# Patient Record
Sex: Male | Born: 1950
Health system: Southern US, Community
[De-identification: ages and names within clinical notes are randomized; demographics above are authoritative.]

## PROBLEM LIST (undated history)

## (undated) DIAGNOSIS — J189 Pneumonia, unspecified organism: Secondary | ICD-10-CM

## (undated) DIAGNOSIS — I447 Left bundle-branch block, unspecified: Secondary | ICD-10-CM

## (undated) DIAGNOSIS — Z95810 Presence of automatic (implantable) cardiac defibrillator: Secondary | ICD-10-CM

## (undated) DIAGNOSIS — F419 Anxiety disorder, unspecified: Secondary | ICD-10-CM

## (undated) DIAGNOSIS — Z8489 Family history of other specified conditions: Secondary | ICD-10-CM

## (undated) DIAGNOSIS — I10 Essential (primary) hypertension: Secondary | ICD-10-CM

## (undated) DIAGNOSIS — M1990 Unspecified osteoarthritis, unspecified site: Secondary | ICD-10-CM

## (undated) DIAGNOSIS — IMO0002 Reserved for concepts with insufficient information to code with codable children: Secondary | ICD-10-CM

## (undated) DIAGNOSIS — I350 Nonrheumatic aortic (valve) stenosis: Secondary | ICD-10-CM

## (undated) DIAGNOSIS — K529 Noninfective gastroenteritis and colitis, unspecified: Secondary | ICD-10-CM

## (undated) DIAGNOSIS — R011 Cardiac murmur, unspecified: Secondary | ICD-10-CM

## (undated) DIAGNOSIS — Z953 Presence of xenogenic heart valve: Secondary | ICD-10-CM

## (undated) DIAGNOSIS — Z720 Tobacco use: Secondary | ICD-10-CM

## (undated) DIAGNOSIS — I2510 Atherosclerotic heart disease of native coronary artery without angina pectoris: Secondary | ICD-10-CM

## (undated) DIAGNOSIS — I428 Other cardiomyopathies: Secondary | ICD-10-CM

## (undated) DIAGNOSIS — R911 Solitary pulmonary nodule: Secondary | ICD-10-CM

## (undated) DIAGNOSIS — N189 Chronic kidney disease, unspecified: Secondary | ICD-10-CM

## (undated) DIAGNOSIS — J449 Chronic obstructive pulmonary disease, unspecified: Secondary | ICD-10-CM

## (undated) DIAGNOSIS — R350 Frequency of micturition: Secondary | ICD-10-CM

## (undated) DIAGNOSIS — E7800 Pure hypercholesterolemia, unspecified: Secondary | ICD-10-CM

## (undated) HISTORY — PX: CARDIAC VALVE REPLACEMENT: SHX585

## (undated) HISTORY — DX: Solitary pulmonary nodule: R91.1

## (undated) HISTORY — PX: SQUAMOUS CELL CARCINOMA EXCISION: SHX2433

---
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
## 2003-07-07 ENCOUNTER — Ambulatory Visit (HOSPITAL_COMMUNITY): Admission: RE | Admit: 2003-07-07 | Discharge: 2003-07-07 | Payer: Self-pay | Admitting: *Deleted

## 2003-07-07 ENCOUNTER — Encounter (INDEPENDENT_AMBULATORY_CARE_PROVIDER_SITE_OTHER): Payer: Self-pay

## 2007-11-08 HISTORY — PX: COLECTOMY: SHX59

## 2008-02-20 ENCOUNTER — Encounter: Admission: RE | Admit: 2008-02-20 | Discharge: 2008-02-20 | Payer: Self-pay | Admitting: Gastroenterology

## 2010-11-28 ENCOUNTER — Encounter: Payer: Self-pay | Admitting: Gastroenterology

## 2015-12-23 DIAGNOSIS — L570 Actinic keratosis: Secondary | ICD-10-CM | POA: Diagnosis not present

## 2015-12-23 DIAGNOSIS — Z85828 Personal history of other malignant neoplasm of skin: Secondary | ICD-10-CM | POA: Diagnosis not present

## 2015-12-23 DIAGNOSIS — Z08 Encounter for follow-up examination after completed treatment for malignant neoplasm: Secondary | ICD-10-CM | POA: Diagnosis not present

## 2016-08-04 DIAGNOSIS — Z23 Encounter for immunization: Secondary | ICD-10-CM | POA: Diagnosis not present

## 2016-08-04 DIAGNOSIS — D492 Neoplasm of unspecified behavior of bone, soft tissue, and skin: Secondary | ICD-10-CM | POA: Diagnosis not present

## 2016-08-04 DIAGNOSIS — F419 Anxiety disorder, unspecified: Secondary | ICD-10-CM | POA: Diagnosis not present

## 2016-09-05 DIAGNOSIS — D2311 Other benign neoplasm of skin of right eyelid, including canthus: Secondary | ICD-10-CM | POA: Diagnosis not present

## 2016-09-05 DIAGNOSIS — H2513 Age-related nuclear cataract, bilateral: Secondary | ICD-10-CM | POA: Diagnosis not present

## 2016-09-05 DIAGNOSIS — D2312 Other benign neoplasm of skin of left eyelid, including canthus: Secondary | ICD-10-CM | POA: Diagnosis not present

## 2016-09-14 DIAGNOSIS — Z85828 Personal history of other malignant neoplasm of skin: Secondary | ICD-10-CM | POA: Diagnosis not present

## 2016-09-14 DIAGNOSIS — Z08 Encounter for follow-up examination after completed treatment for malignant neoplasm: Secondary | ICD-10-CM | POA: Diagnosis not present

## 2016-09-14 DIAGNOSIS — L570 Actinic keratosis: Secondary | ICD-10-CM | POA: Diagnosis not present

## 2016-10-19 DIAGNOSIS — H01004 Unspecified blepharitis left upper eyelid: Secondary | ICD-10-CM | POA: Diagnosis not present

## 2016-10-19 DIAGNOSIS — H01002 Unspecified blepharitis right lower eyelid: Secondary | ICD-10-CM | POA: Diagnosis not present

## 2016-10-19 DIAGNOSIS — H01001 Unspecified blepharitis right upper eyelid: Secondary | ICD-10-CM | POA: Diagnosis not present

## 2016-10-19 DIAGNOSIS — H01005 Unspecified blepharitis left lower eyelid: Secondary | ICD-10-CM | POA: Diagnosis not present

## 2016-11-07 DIAGNOSIS — J189 Pneumonia, unspecified organism: Secondary | ICD-10-CM

## 2016-11-07 HISTORY — DX: Pneumonia, unspecified organism: J18.9

## 2017-06-01 DIAGNOSIS — F419 Anxiety disorder, unspecified: Secondary | ICD-10-CM | POA: Diagnosis not present

## 2017-06-01 DIAGNOSIS — Z125 Encounter for screening for malignant neoplasm of prostate: Secondary | ICD-10-CM | POA: Diagnosis not present

## 2017-06-01 DIAGNOSIS — Z0000 Encounter for general adult medical examination without abnormal findings: Secondary | ICD-10-CM | POA: Diagnosis not present

## 2017-06-01 DIAGNOSIS — Z131 Encounter for screening for diabetes mellitus: Secondary | ICD-10-CM | POA: Diagnosis not present

## 2017-06-01 DIAGNOSIS — Z23 Encounter for immunization: Secondary | ICD-10-CM | POA: Diagnosis not present

## 2017-06-01 DIAGNOSIS — C44222 Squamous cell carcinoma of skin of right ear and external auricular canal: Secondary | ICD-10-CM | POA: Diagnosis not present

## 2017-06-01 DIAGNOSIS — Z136 Encounter for screening for cardiovascular disorders: Secondary | ICD-10-CM | POA: Diagnosis not present

## 2017-06-01 DIAGNOSIS — Z79899 Other long term (current) drug therapy: Secondary | ICD-10-CM | POA: Diagnosis not present

## 2017-07-26 DIAGNOSIS — Z08 Encounter for follow-up examination after completed treatment for malignant neoplasm: Secondary | ICD-10-CM | POA: Diagnosis not present

## 2017-07-26 DIAGNOSIS — L570 Actinic keratosis: Secondary | ICD-10-CM | POA: Diagnosis not present

## 2017-07-26 DIAGNOSIS — Z85828 Personal history of other malignant neoplasm of skin: Secondary | ICD-10-CM | POA: Diagnosis not present

## 2017-09-01 ENCOUNTER — Encounter (HOSPITAL_BASED_OUTPATIENT_CLINIC_OR_DEPARTMENT_OTHER): Payer: Self-pay

## 2017-09-01 ENCOUNTER — Emergency Department (HOSPITAL_BASED_OUTPATIENT_CLINIC_OR_DEPARTMENT_OTHER): Payer: Medicare HMO

## 2017-09-01 ENCOUNTER — Inpatient Hospital Stay (HOSPITAL_BASED_OUTPATIENT_CLINIC_OR_DEPARTMENT_OTHER)
Admission: EM | Admit: 2017-09-01 | Discharge: 2017-09-08 | DRG: 871 | Disposition: A | Discharge status: Home Health | Payer: Medicare HMO | Attending: Internal Medicine | Admitting: Internal Medicine

## 2017-09-01 DIAGNOSIS — I5021 Acute systolic (congestive) heart failure: Secondary | ICD-10-CM

## 2017-09-01 DIAGNOSIS — Y95 Nosocomial condition: Secondary | ICD-10-CM | POA: Diagnosis not present

## 2017-09-01 DIAGNOSIS — Z23 Encounter for immunization: Secondary | ICD-10-CM

## 2017-09-01 DIAGNOSIS — Y712 Prosthetic and other implants, materials and accessory cardiovascular devices associated with adverse incidents: Secondary | ICD-10-CM | POA: Diagnosis not present

## 2017-09-01 DIAGNOSIS — T82524A Displacement of infusion catheter, initial encounter: Secondary | ICD-10-CM | POA: Diagnosis present

## 2017-09-01 DIAGNOSIS — I272 Pulmonary hypertension, unspecified: Secondary | ICD-10-CM | POA: Diagnosis present

## 2017-09-01 DIAGNOSIS — G9340 Encephalopathy, unspecified: Secondary | ICD-10-CM | POA: Diagnosis present

## 2017-09-01 DIAGNOSIS — R6520 Severe sepsis without septic shock: Secondary | ICD-10-CM

## 2017-09-01 DIAGNOSIS — Z95828 Presence of other vascular implants and grafts: Secondary | ICD-10-CM

## 2017-09-01 DIAGNOSIS — N170 Acute kidney failure with tubular necrosis: Secondary | ICD-10-CM | POA: Diagnosis not present

## 2017-09-01 DIAGNOSIS — Z452 Encounter for adjustment and management of vascular access device: Secondary | ICD-10-CM | POA: Diagnosis not present

## 2017-09-01 DIAGNOSIS — I447 Left bundle-branch block, unspecified: Secondary | ICD-10-CM | POA: Diagnosis not present

## 2017-09-01 DIAGNOSIS — I428 Other cardiomyopathies: Secondary | ICD-10-CM

## 2017-09-01 DIAGNOSIS — J9602 Acute respiratory failure with hypercapnia: Secondary | ICD-10-CM | POA: Diagnosis not present

## 2017-09-01 DIAGNOSIS — T380X5A Adverse effect of glucocorticoids and synthetic analogues, initial encounter: Secondary | ICD-10-CM | POA: Diagnosis present

## 2017-09-01 DIAGNOSIS — R6521 Severe sepsis with septic shock: Secondary | ICD-10-CM | POA: Diagnosis not present

## 2017-09-01 DIAGNOSIS — J449 Chronic obstructive pulmonary disease, unspecified: Secondary | ICD-10-CM | POA: Diagnosis present

## 2017-09-01 DIAGNOSIS — E785 Hyperlipidemia, unspecified: Secondary | ICD-10-CM | POA: Diagnosis present

## 2017-09-01 DIAGNOSIS — F419 Anxiety disorder, unspecified: Secondary | ICD-10-CM | POA: Diagnosis present

## 2017-09-01 DIAGNOSIS — J9601 Acute respiratory failure with hypoxia: Secondary | ICD-10-CM | POA: Diagnosis not present

## 2017-09-01 DIAGNOSIS — Z01818 Encounter for other preprocedural examination: Secondary | ICD-10-CM

## 2017-09-01 DIAGNOSIS — R40241 Glasgow coma scale score 13-15, unspecified time: Secondary | ICD-10-CM | POA: Diagnosis present

## 2017-09-01 DIAGNOSIS — J9690 Respiratory failure, unspecified, unspecified whether with hypoxia or hypercapnia: Secondary | ICD-10-CM | POA: Diagnosis not present

## 2017-09-01 DIAGNOSIS — K760 Fatty (change of) liver, not elsewhere classified: Secondary | ICD-10-CM | POA: Diagnosis not present

## 2017-09-01 DIAGNOSIS — R0602 Shortness of breath: Secondary | ICD-10-CM | POA: Diagnosis not present

## 2017-09-01 DIAGNOSIS — Z7982 Long term (current) use of aspirin: Secondary | ICD-10-CM

## 2017-09-01 DIAGNOSIS — I429 Cardiomyopathy, unspecified: Secondary | ICD-10-CM | POA: Diagnosis present

## 2017-09-01 DIAGNOSIS — R9431 Abnormal electrocardiogram [ECG] [EKG]: Secondary | ICD-10-CM | POA: Diagnosis not present

## 2017-09-01 DIAGNOSIS — R918 Other nonspecific abnormal finding of lung field: Secondary | ICD-10-CM | POA: Diagnosis not present

## 2017-09-01 DIAGNOSIS — R7303 Prediabetes: Secondary | ICD-10-CM | POA: Diagnosis present

## 2017-09-01 DIAGNOSIS — T82898A Other specified complication of vascular prosthetic devices, implants and grafts, initial encounter: Secondary | ICD-10-CM | POA: Diagnosis not present

## 2017-09-01 DIAGNOSIS — Z720 Tobacco use: Secondary | ICD-10-CM | POA: Diagnosis present

## 2017-09-01 DIAGNOSIS — J9600 Acute respiratory failure, unspecified whether with hypoxia or hypercapnia: Secondary | ICD-10-CM | POA: Diagnosis not present

## 2017-09-01 DIAGNOSIS — K761 Chronic passive congestion of liver: Secondary | ICD-10-CM | POA: Diagnosis present

## 2017-09-01 DIAGNOSIS — F17210 Nicotine dependence, cigarettes, uncomplicated: Secondary | ICD-10-CM | POA: Diagnosis present

## 2017-09-01 DIAGNOSIS — I248 Other forms of acute ischemic heart disease: Secondary | ICD-10-CM | POA: Diagnosis present

## 2017-09-01 DIAGNOSIS — Z8249 Family history of ischemic heart disease and other diseases of the circulatory system: Secondary | ICD-10-CM

## 2017-09-01 DIAGNOSIS — K529 Noninfective gastroenteritis and colitis, unspecified: Secondary | ICD-10-CM | POA: Diagnosis present

## 2017-09-01 DIAGNOSIS — I350 Nonrheumatic aortic (valve) stenosis: Secondary | ICD-10-CM | POA: Diagnosis not present

## 2017-09-01 DIAGNOSIS — E876 Hypokalemia: Secondary | ICD-10-CM | POA: Diagnosis present

## 2017-09-01 DIAGNOSIS — Z4682 Encounter for fitting and adjustment of non-vascular catheter: Secondary | ICD-10-CM | POA: Diagnosis not present

## 2017-09-01 DIAGNOSIS — Z9049 Acquired absence of other specified parts of digestive tract: Secondary | ICD-10-CM

## 2017-09-01 DIAGNOSIS — J440 Chronic obstructive pulmonary disease with acute lower respiratory infection: Secondary | ICD-10-CM | POA: Diagnosis not present

## 2017-09-01 DIAGNOSIS — Z4659 Encounter for fitting and adjustment of other gastrointestinal appliance and device: Secondary | ICD-10-CM

## 2017-09-01 DIAGNOSIS — Z79899 Other long term (current) drug therapy: Secondary | ICD-10-CM

## 2017-09-01 DIAGNOSIS — D696 Thrombocytopenia, unspecified: Secondary | ICD-10-CM | POA: Diagnosis present

## 2017-09-01 DIAGNOSIS — A419 Sepsis, unspecified organism: Principal | ICD-10-CM | POA: Diagnosis present

## 2017-09-01 DIAGNOSIS — I2510 Atherosclerotic heart disease of native coronary artery without angina pectoris: Secondary | ICD-10-CM | POA: Diagnosis present

## 2017-09-01 DIAGNOSIS — J122 Parainfluenza virus pneumonia: Secondary | ICD-10-CM | POA: Diagnosis not present

## 2017-09-01 HISTORY — DX: Anxiety disorder, unspecified: F41.9

## 2017-09-01 HISTORY — DX: Left bundle-branch block, unspecified: I44.7

## 2017-09-01 HISTORY — DX: Noninfective gastroenteritis and colitis, unspecified: K52.9

## 2017-09-01 HISTORY — DX: Nonrheumatic aortic (valve) stenosis: I35.0

## 2017-09-01 HISTORY — DX: Chronic obstructive pulmonary disease, unspecified: J44.9

## 2017-09-01 HISTORY — DX: Tobacco use: Z72.0

## 2017-09-01 HISTORY — DX: Other cardiomyopathies: I42.8

## 2017-09-01 LAB — I-STAT ARTERIAL BLOOD GAS, ED
Acid-base deficit: 10 mmol/L — ABNORMAL HIGH (ref 0.0–2.0)
BICARBONATE: 18.1 mmol/L — AB (ref 20.0–28.0)
O2 Saturation: 94 %
PO2 ART: 100 mmHg (ref 83.0–108.0)
TCO2: 19 mmol/L — ABNORMAL LOW (ref 22–32)
pCO2 arterial: 50.2 mmHg — ABNORMAL HIGH (ref 32.0–48.0)
pH, Arterial: 7.176 — CL (ref 7.350–7.450)

## 2017-09-01 LAB — CBC WITH DIFFERENTIAL/PLATELET
BASOS PCT: 0 %
Basophils Absolute: 0 10*3/uL (ref 0.0–0.1)
EOS ABS: 0 10*3/uL (ref 0.0–0.7)
Eosinophils Relative: 0 %
HCT: 53.5 % — ABNORMAL HIGH (ref 39.0–52.0)
HEMOGLOBIN: 17.3 g/dL — AB (ref 13.0–17.0)
LYMPHS ABS: 4 10*3/uL (ref 0.7–4.0)
Lymphocytes Relative: 40 %
MCH: 31.4 pg (ref 26.0–34.0)
MCHC: 32.3 g/dL (ref 30.0–36.0)
MCV: 97.1 fL (ref 78.0–100.0)
Monocytes Absolute: 1 10*3/uL (ref 0.1–1.0)
Monocytes Relative: 11 %
NEUTROS PCT: 49 %
Neutro Abs: 4.8 10*3/uL (ref 1.7–7.7)
Platelets: 129 10*3/uL — ABNORMAL LOW (ref 150–400)
RBC: 5.51 MIL/uL (ref 4.22–5.81)
RDW: 14.7 % (ref 11.5–15.5)
WBC: 9.9 10*3/uL (ref 4.0–10.5)

## 2017-09-01 LAB — COMPREHENSIVE METABOLIC PANEL
ALBUMIN: 4.1 g/dL (ref 3.5–5.0)
ALK PHOS: 132 U/L — AB (ref 38–126)
ALT: 68 U/L — AB (ref 17–63)
AST: 128 U/L — AB (ref 15–41)
Anion gap: 13 (ref 5–15)
BUN: 15 mg/dL (ref 6–20)
CALCIUM: 8.5 mg/dL — AB (ref 8.9–10.3)
CO2: 21 mmol/L — AB (ref 22–32)
CREATININE: 1.58 mg/dL — AB (ref 0.61–1.24)
Chloride: 105 mmol/L (ref 101–111)
GFR calc Af Amer: 51 mL/min — ABNORMAL LOW (ref >60)
GFR calc non Af Amer: 44 mL/min — ABNORMAL LOW (ref >60)
GLUCOSE: 257 mg/dL — AB (ref 65–99)
Potassium: 3.4 mmol/L — ABNORMAL LOW (ref 3.5–5.1)
SODIUM: 139 mmol/L (ref 135–145)
Total Bilirubin: 0.9 mg/dL (ref 0.3–1.2)
Total Protein: 7.3 g/dL (ref 6.5–8.1)

## 2017-09-01 LAB — BRAIN NATRIURETIC PEPTIDE: B Natriuretic Peptide: 885.5 pg/mL — ABNORMAL HIGH (ref 0.0–100.0)

## 2017-09-01 LAB — I-STAT CG4 LACTIC ACID, ED: Lactic Acid, Venous: 3.17 mmol/L (ref 0.5–1.9)

## 2017-09-01 LAB — TROPONIN I: Troponin I: 0.44 ng/mL (ref <0.03)

## 2017-09-01 MED ORDER — SODIUM CHLORIDE 0.9 % IV BOLUS (SEPSIS)
1000.0000 mL | Freq: Once | INTRAVENOUS | Status: AC
  Administered 2017-09-01 (×2): 1000 mL via INTRAVENOUS

## 2017-09-01 MED ORDER — SODIUM CHLORIDE 0.9 % IV BOLUS (SEPSIS)
1000.0000 mL | Freq: Once | INTRAVENOUS | Status: AC
  Administered 2017-09-01: 1000 mL via INTRAVENOUS

## 2017-09-01 MED ORDER — FENTANYL CITRATE (PF) 100 MCG/2ML IJ SOLN
50.0000 ug | Freq: Once | INTRAMUSCULAR | Status: AC
  Administered 2017-09-01: 50 ug via INTRAVENOUS
  Filled 2017-09-01: qty 2

## 2017-09-01 MED ORDER — LEVALBUTEROL HCL 0.63 MG/3ML IN NEBU
0.6300 mg | INHALATION_SOLUTION | Freq: Once | RESPIRATORY_TRACT | Status: AC
  Administered 2017-09-01: 0.63 mg via RESPIRATORY_TRACT
  Filled 2017-09-01: qty 3

## 2017-09-01 MED ORDER — METHYLPREDNISOLONE SODIUM SUCC 125 MG IJ SOLR
125.0000 mg | Freq: Once | INTRAMUSCULAR | Status: AC
  Administered 2017-09-01: 125 mg via INTRAVENOUS
  Filled 2017-09-01: qty 2

## 2017-09-01 MED ORDER — LORAZEPAM 2 MG/ML IJ SOLN
0.5000 mg | Freq: Once | INTRAMUSCULAR | Status: AC
  Administered 2017-09-01: 0.5 mg via INTRAVENOUS

## 2017-09-01 MED ORDER — ETOMIDATE 2 MG/ML IV SOLN
INTRAVENOUS | Status: AC
  Administered 2017-09-01: 20 mg
  Filled 2017-09-01: qty 10

## 2017-09-01 MED ORDER — LORAZEPAM 2 MG/ML IJ SOLN
INTRAMUSCULAR | Status: AC
  Filled 2017-09-01: qty 1

## 2017-09-01 MED ORDER — MIDAZOLAM HCL 10 MG/2ML IJ SOLN
INTRAMUSCULAR | Status: AC
  Filled 2017-09-01: qty 10

## 2017-09-01 MED ORDER — SODIUM CHLORIDE 0.9 % IV SOLN
0.5000 mg/h | INTRAVENOUS | Status: DC
  Filled 2017-09-01: qty 10

## 2017-09-01 MED ORDER — ACETAMINOPHEN 650 MG RE SUPP
RECTAL | Status: AC
  Filled 2017-09-01: qty 1

## 2017-09-01 MED ORDER — ACETAMINOPHEN 650 MG RE SUPP
650.0000 mg | Freq: Once | RECTAL | Status: AC
  Administered 2017-09-01: 650 mg via RECTAL

## 2017-09-01 MED ORDER — PROPOFOL 1000 MG/100ML IV EMUL
INTRAVENOUS | Status: AC
  Administered 2017-09-01: 23:00:00 via INTRAVENOUS
  Filled 2017-09-01: qty 100

## 2017-09-01 MED ORDER — ACETAMINOPHEN 500 MG PO TABS: 1000.0000 mg | ORAL_TABLET | Freq: Once | ORAL | Status: DC

## 2017-09-01 MED ORDER — PIPERACILLIN-TAZOBACTAM 3.375 G IVPB 30 MIN
3.3750 g | Freq: Once | INTRAVENOUS | Status: AC
  Administered 2017-09-01: 3.375 g via INTRAVENOUS
  Filled 2017-09-01 (×2): qty 50

## 2017-09-01 MED ORDER — NOREPINEPHRINE BITARTRATE 1 MG/ML IV SOLN
0.0000 ug/min | Freq: Once | INTRAVENOUS | Status: AC
  Administered 2017-09-01: 18.8 ug/min via INTRAVENOUS
  Filled 2017-09-01: qty 4

## 2017-09-01 MED ORDER — VANCOMYCIN HCL IN DEXTROSE 1-5 GM/200ML-% IV SOLN
1000.0000 mg | Freq: Once | INTRAVENOUS | Status: AC
  Administered 2017-09-01: 1000 mg via INTRAVENOUS
  Filled 2017-09-01: qty 200

## 2017-09-01 MED ORDER — SUCCINYLCHOLINE CHLORIDE 20 MG/ML IJ SOLN
INTRAMUSCULAR | Status: AC
  Administered 2017-09-01: 100 mg
  Filled 2017-09-01: qty 1

## 2017-09-01 NOTE — ED Triage Notes (Signed)
Pt c/o SOB x 1.5 hrs-states he did have prod cough this week-pt labored breathing-brought back to tx room via w/c

## 2017-09-01 NOTE — ED Notes (Signed)
Critical troponin, 0.44, [place] informed

## 2017-09-01 NOTE — ED Notes (Signed)
Contacted [employer] account planner) - Critical Care - Code Sepsis.

## 2017-09-01 NOTE — ED Provider Notes (Signed)
[facility] EMERGENCY DEPARTMENT Provider Note   CSN: [id number] Arrival date & time: 09/01/17  2150     History   Chief Complaint Chief Complaint  Patient presents with  . Shortness of Breath    HPI Kenneth Mills is a 66 y.o. male.  HPI Presents with 3 days of increasing shortness of breath.  Associated with subjective fever and chills.  He has had a productive cough.  Denies having any chest pain.  No new lower extremity swelling or pain. Past Medical History:  Diagnosis Date  . COPD (chronic obstructive pulmonary disease) [facility])     Patient Active Problem List   Diagnosis Date Noted  . Respiratory failure ([place]) 09/01/2017    Past Surgical History:  Procedure Laterality Date  . COLON SURGERY    . HERNIA REPAIR         Home Medications    Prior to Admission medications   Medication Sig Start Date End Date Taking? Authorizing Provider  ClonazePAM (KLONOPIN PO) Take by mouth.   Yes [provider]    Family History No family history on file.  Social History Social History  Substance Use Topics  . Smoking status: Current Every Day Smoker    Types: Cigarettes  . Smokeless tobacco: Never Used  . Alcohol use Yes     Allergies   Patient has no known allergies.   Review of Systems Review of Systems  Constitutional: Positive for chills, fatigue and fever.  Respiratory: Positive for cough and shortness of breath.   Cardiovascular: Negative for chest pain and leg swelling.  Gastrointestinal: Negative for diarrhea and vomiting.  Genitourinary: Negative for dysuria and flank pain.  Musculoskeletal: Negative for back pain, neck pain and neck stiffness.  Skin: Negative for rash and wound.  Neurological: Positive for weakness. Negative for dizziness, numbness and headaches.  All other systems reviewed and are negative.    Physical Exam Updated Vital Signs BP (!) 130/92   Pulse (!) 144   Temp (!) 102.8 F (39.3 C) (Rectal)    Resp 14   SpO2 (!) 80%   Physical Exam  Constitutional: He is oriented to person, place, and time. He appears well-developed and well-nourished. He appears distressed.  HENT:  Head: Normocephalic and atraumatic.  Mouth/Throat: Oropharynx is clear and moist. No oropharyngeal exudate.  Eyes: Pupils are equal, round, and reactive to light. EOM are normal.  Neck: Normal range of motion. Neck supple. No JVD present.  Cardiovascular: Regular rhythm.   Tachycardia  Pulmonary/Chest:  Increased respiratory effort.  Using abdominal accessory muscles.  Prolonged expiratory phase and end expiratory wheezes especially in the left lung fields.  Rhonchi in left lung base.  Abdominal: Soft. Bowel sounds are normal. There is no tenderness. There is no rebound and no guarding.  Musculoskeletal: Normal range of motion. He exhibits no edema or tenderness.  No lower extremity swelling or asymmetry.  Distal pulses intact.  Lymphadenopathy:    He has no cervical adenopathy.  Neurological: He is alert and oriented to person, place, and time.  Skin: Skin is warm and dry. No rash noted. No erythema.  Mottled and cyanotic appearing  Nursing note and vitals reviewed.    ED Treatments / Results  Labs (all labs ordered are listed, but only abnormal results are displayed) Labs Reviewed  BRAIN NATRIURETIC PEPTIDE - Abnormal; Notable for the following:       Result Value   B Natriuretic Peptide 885.5 (*)    All other components  within normal limits  CBC WITH DIFFERENTIAL/PLATELET - Abnormal; Notable for the following:    Hemoglobin 17.3 (*)    HCT 53.5 (*)    Platelets 129 (*)    All other components within normal limits  COMPREHENSIVE METABOLIC PANEL - Abnormal; Notable for the following:    Potassium 3.4 (*)    CO2 21 (*)    Glucose, Bld 257 (*)    Creatinine, Ser 1.58 (*)    Calcium 8.5 (*)    AST 128 (*)    ALT 68 (*)    Alkaline Phosphatase 132 (*)    GFR calc non Af Amer 44 (*)    GFR calc Af  Amer 51 (*)    All other components within normal limits  TROPONIN I - Abnormal; Notable for the following:    Troponin I 0.44 (*)    All other components within normal limits  I-STAT CG4 LACTIC ACID, ED - Abnormal; Notable for the following:    Lactic Acid, Venous 3.17 (*)    All other components within normal limits  I-STAT ARTERIAL BLOOD GAS, ED - Abnormal; Notable for the following:    pH, Arterial 7.176 (*)    pCO2 arterial 50.2 (*)    Bicarbonate 18.1 (*)    TCO2 19 (*)    Acid-base deficit 10.0 (*)    All other components within normal limits  CULTURE, BLOOD (ROUTINE X 2)  CULTURE, BLOOD (ROUTINE X 2)  BLOOD GAS, ARTERIAL  INFLUENZA PANEL BY PCR (TYPE A & B)    EKG  EKG Interpretation  Date/Time:  Friday September 01 2017 22:59:00 EDT Ventricular Rate:  143 PR Interval:    QRS Duration: 160 QT Interval:  354 QTC Calculation: 547 R Axis:   23 Text Interpretation:  Sinus tachycardia Left bundle branch block Baseline wander in lead(s) V3 Confirmed by Julianne Rice [phone]) on 09/01/2017 11:17:39 PM       Radiology Dg Chest Port 1 View  Result Date: 09/01/2017 CLINICAL DATA:  Shortness of breath and fever EXAM: PORTABLE CHEST 1 VIEW COMPARISON:  None. FINDINGS: Cardiomegaly. Extensive diffuse interstitial and alveolar opacity right greater than left. No pleural effusion. No pneumothorax. IMPRESSION: 1. Extensive diffuse right greater than left interstitial and alveolar opacity which may reflect pulmonary edema, diffuse infection or combination of both 2. Cardiomegaly Electronically Signed   By: Donavan Foil M.D.   On: 09/01/2017 22:24   Dg Chest Port 1v Same Day  Result Date: 09/01/2017 CLINICAL DATA:  Post intubation EXAM: PORTABLE CHEST 1 VIEW COMPARISON:  09/01/2017 FINDINGS: Interval placement of endotracheal tube, the tip is about 3.4 cm superior to carina. Non inclusion of lung bases. Similar appearance of extensive interstitial and alveolar opacity.  Cardiomegaly IMPRESSION: Endotracheal tube tip about 3.4 cm superior to carina. Similar appearance of extensive interstitial and alveolar infiltrate or edema Electronically Signed   By: Donavan Foil M.D.   On: 09/01/2017 23:09    Procedures Procedure Name: Intubation Date/Time: 09/01/2017 11:12 PM Performed by: Julianne Rice Pre-anesthesia Checklist: Suction available, Patient identified and Patient being monitored Oxygen Delivery Method: Ambu bag Preoxygenation: Pre-oxygenation with 100% oxygen Induction Type: Rapid sequence Ventilation: Mask ventilation without difficulty Laryngoscope Size: Glidescope and 4 Tube size: 8.0 mm Number of attempts: 1 Placement Confirmation: ETT inserted through vocal cords under direct vision,  CO2 detector and Breath sounds checked- equal and bilateral Secured at: 25 cm Tube secured with: ETT holder Comments: Desaturation into the 70s which improved on vent.      (  including critical care time)  Medications Ordered in ED Medications  LORazepam (ATIVAN) 2 MG/ML injection (not administered)  sodium chloride 0.9 % bolus 1,000 mL (1,000 mLs Intravenous New Bag/Given 09/01/17 2310)  sodium chloride 0.9 % bolus 1,000 mL (1,000 mLs Intravenous New Bag/Given 09/01/17 2310)  fentaNYL (SUBLIMAZE) injection 50 mcg (not administered)  sodium chloride 0.9 % bolus 1,000 mL (0 mLs Intravenous Stopped 09/01/17 2300)  vancomycin (VANCOCIN) IVPB 1000 mg/200 mL premix (1,000 mg Intravenous New Bag/Given 09/01/17 2206)  piperacillin-tazobactam (ZOSYN) IVPB 3.375 g (0 g Intravenous Stopped 09/01/17 2232)  levalbuterol (XOPENEX) nebulizer solution 0.63 mg (0.63 mg Nebulization Given 09/01/17 2207)  methylPREDNISolone sodium succinate (SOLU-MEDROL) 125 mg/2 mL injection 125 mg (125 mg Intravenous Given 09/01/17 2206)  acetaminophen (TYLENOL) suppository 650 mg (650 mg Rectal Given 09/01/17 2206)  LORazepam (ATIVAN) injection 0.5 mg (0.5 mg Intravenous Given 09/01/17  2217)  etomidate (AMIDATE) 2 MG/ML injection (20 mg  Given 09/01/17 2252)  succinylcholine (ANECTINE) 20 MG/ML injection (100 mg  Given 09/01/17 2252)  propofol (DIPRIVAN) 1000 MG/100ML infusion ( Intravenous (Continuous Infusion) New Bag/Given 09/01/17 2256)   CRITICAL CARE Performed by: Lita Mains, Haruna Rohlfs Total critical care time:55 minutes Critical care time was exclusive of separately billable procedures and treating other patients. Critical care was necessary to treat or prevent imminent or life-threatening deterioration. Critical care was time spent personally by me on the following activities: development of treatment plan with patient and/or surrogate as well as nursing, discussions with consultants, evaluation of patient's response to treatment, examination of patient, obtaining history from patient or surrogate, ordering and performing treatments and interventions, ordering and review of laboratory studies, ordering and review of radiographic studies, pulse oximetry and re-evaluation of patient's condition.  Initial Impression / Assessment and Plan / ED Course  I have reviewed the triage vital signs and the nursing notes.  Pertinent labs & imaging results that were available during my care of the patient were reviewed by me and considered in my medical decision making (see chart for details).    Initial trial of BiPAP with improvement of oxygen saturations.  Patient continued to be tachycardic and tachypneic.  Rectal temperature of 102.8.  Initiated sepsis protocol with broad spectrum antibiotics and IV fluids.  Chest x-ray consistent with infectious process versus pulmonary edema.  Patient beginning to tire on BiPAP.  Decision made to intubate.  Discussed with both patient and the patient's daughter.  During the patient patient's saturations dropped into the 70s.  Improve gradually on ventilator.  Repeat chest x-ray with worsening bilateral infiltrates.  ET tube appears to be in adequate  positioning.  Discussed with Dr. Valentino Saxon.  Will accept patient in transfer to [facility] ICU.  Final Clinical Impressions(s) / ED Diagnoses   Final diagnoses:  Severe sepsis ([place])  Acute respiratory failure with hypoxia [facility])    New Prescriptions New Prescriptions   No medications on file     Julianne Rice, MD 09/01/17 2317

## 2017-09-01 NOTE — ED Notes (Signed)
Family at bedside. 

## 2017-09-02 ENCOUNTER — Inpatient Hospital Stay (HOSPITAL_COMMUNITY): Payer: Medicare HMO

## 2017-09-02 DIAGNOSIS — R9431 Abnormal electrocardiogram [ECG] [EKG]: Secondary | ICD-10-CM

## 2017-09-02 LAB — RESPIRATORY PANEL BY PCR
ADENOVIRUS-RVPPCR: NOT DETECTED
BORDETELLA PERTUSSIS-RVPCR: NOT DETECTED
CORONAVIRUS 229E-RVPPCR: NOT DETECTED
CORONAVIRUS HKU1-RVPPCR: NOT DETECTED
CORONAVIRUS NL63-RVPPCR: NOT DETECTED
CORONAVIRUS OC43-RVPPCR: NOT DETECTED
Chlamydophila pneumoniae: NOT DETECTED
Influenza A: NOT DETECTED
Influenza B: NOT DETECTED
METAPNEUMOVIRUS-RVPPCR: NOT DETECTED
Mycoplasma pneumoniae: NOT DETECTED
PARAINFLUENZA VIRUS 1-RVPPCR: NOT DETECTED
PARAINFLUENZA VIRUS 2-RVPPCR: DETECTED — AB
PARAINFLUENZA VIRUS 3-RVPPCR: NOT DETECTED
Parainfluenza Virus 4: NOT DETECTED
RHINOVIRUS / ENTEROVIRUS - RVPPCR: NOT DETECTED
Respiratory Syncytial Virus: NOT DETECTED

## 2017-09-02 LAB — ECHOCARDIOGRAM COMPLETE
AV Area mean vel: 0.62 cm2
AV VEL mean LVOT/AV: 0.18
AV area mean vel ind: 0.31 cm2/m2
AV peak Index: 0.34
AV pk vel: 328 cm/s
AV vel: 0.66
AVA: 0.66 cm2
AVAREAVTI: 0.67 cm2
AVAREAVTIIND: 0.33 cm2/m2
AVG: 25 mmHg
AVPG: 43 mmHg
Ao pk vel: 0.19 m/s
CHL CUP AV VALUE AREA INDEX: 0.33
CHL CUP DOP CALC LVOT VTI: 12.7 cm
DOP CAL AO MEAN VELOCITY: 233 cm/s
FS: 12 % — AB (ref 28–44)
Height: 71 in
IV/PV OW: 0.82
LA diam end sys: 52 mm
LA vol index: 33.3 mL/m2
LA vol: 66.6 mL
LADIAMINDEX: 2.6 cm/m2
LASIZE: 52 mm
LAVOLA4C: 54.6 mL
LDCA: 3.46 cm2
LV TDI E'LATERAL: 13.4
LV dias vol index: 117 mL/m2
LVDIAVOL: 233 mL — AB (ref 62–150)
LVELAT: 13.4 cm/s
LVOT diameter: 21 mm
LVOT peak VTI: 0.19 cm
LVOTPV: 63.1 cm/s
LVOTSV: 44 mL
MV VTI: 161 cm
PW: 13.6 mm — AB (ref 0.6–1.1)
RV LATERAL S' VELOCITY: 12 cm/s
RV TAPSE: 15.5 mm
VTI: 66.6 cm
Weight: 2818.36 oz

## 2017-09-02 LAB — COMPREHENSIVE METABOLIC PANEL
ALT: 110 U/L — AB (ref 17–63)
AST: 217 U/L — AB (ref 15–41)
Albumin: 3.2 g/dL — ABNORMAL LOW (ref 3.5–5.0)
Alkaline Phosphatase: 117 U/L (ref 38–126)
Anion gap: 11 (ref 5–15)
BUN: 12 mg/dL (ref 6–20)
CHLORIDE: 112 mmol/L — AB (ref 101–111)
CO2: 16 mmol/L — AB (ref 22–32)
CREATININE: 1.31 mg/dL — AB (ref 0.61–1.24)
Calcium: 7.3 mg/dL — ABNORMAL LOW (ref 8.9–10.3)
GFR calc Af Amer: >60 mL/min (ref >60)
GFR, EST NON AFRICAN AMERICAN: 55 mL/min — AB (ref >60)
Glucose, Bld: 181 mg/dL — ABNORMAL HIGH (ref 65–99)
Potassium: 4.6 mmol/L (ref 3.5–5.1)
Sodium: 139 mmol/L (ref 135–145)
Total Bilirubin: 1.5 mg/dL — ABNORMAL HIGH (ref 0.3–1.2)
Total Protein: 5.8 g/dL — ABNORMAL LOW (ref 6.5–8.1)

## 2017-09-02 LAB — I-STAT ARTERIAL BLOOD GAS, ED
ACID-BASE DEFICIT: 10 mmol/L — AB (ref 0.0–2.0)
Bicarbonate: 17 mmol/L — ABNORMAL LOW (ref 20.0–28.0)
O2 Saturation: 90 %
PH ART: 7.212 — AB (ref 7.350–7.450)
TCO2: 18 mmol/L — ABNORMAL LOW (ref 22–32)
pCO2 arterial: 42.6 mmHg (ref 32.0–48.0)
pO2, Arterial: 74 mmHg — ABNORMAL LOW (ref 83.0–108.0)

## 2017-09-02 LAB — URINALYSIS, ROUTINE W REFLEX MICROSCOPIC
Bilirubin Urine: NEGATIVE
Glucose, UA: 100 mg/dL — AB
Ketones, ur: NEGATIVE mg/dL
LEUKOCYTES UA: NEGATIVE
Nitrite: NEGATIVE
PH: 6 (ref 5.0–8.0)
Protein, ur: 100 mg/dL — AB
SPECIFIC GRAVITY, URINE: 1.02 (ref 1.005–1.030)

## 2017-09-02 LAB — PROTIME-INR
INR: 1.15
PROTHROMBIN TIME: 14.6 s (ref 11.4–15.2)

## 2017-09-02 LAB — PROCALCITONIN: PROCALCITONIN: 4.53 ng/mL

## 2017-09-02 LAB — GLUCOSE, CAPILLARY
GLUCOSE-CAPILLARY: 220 mg/dL — AB (ref 65–99)
GLUCOSE-CAPILLARY: 187 mg/dL — AB (ref 65–99)
Glucose-Capillary: 138 mg/dL — ABNORMAL HIGH (ref 65–99)
Glucose-Capillary: 200 mg/dL — ABNORMAL HIGH (ref 65–99)

## 2017-09-02 LAB — POCT I-STAT 3, ART BLOOD GAS (G3+)
Acid-base deficit: 6 mmol/L — ABNORMAL HIGH (ref 0.0–2.0)
Bicarbonate: 18.5 mmol/L — ABNORMAL LOW (ref 20.0–28.0)
O2 Saturation: 93 %
PCO2 ART: 36.1 mmHg (ref 32.0–48.0)
PH ART: 7.322 — AB (ref 7.350–7.450)
TCO2: 20 mmol/L — AB (ref 22–32)
pO2, Arterial: 77 mmHg — ABNORMAL LOW (ref 83.0–108.0)

## 2017-09-02 LAB — URINALYSIS, MICROSCOPIC (REFLEX)

## 2017-09-02 LAB — ABO/RH: ABO/RH(D): O POS

## 2017-09-02 LAB — TROPONIN I
TROPONIN I: 2.03 ng/mL — AB (ref <0.03)
Troponin I: 1.41 ng/mL (ref <0.03)
Troponin I: 2.57 ng/mL (ref <0.03)

## 2017-09-02 LAB — PHOSPHORUS
PHOSPHORUS: 3 mg/dL (ref 2.5–4.6)
Phosphorus: 2.7 mg/dL (ref 2.5–4.6)
Phosphorus: 2.2 mg/dL — ABNORMAL LOW (ref 2.5–4.6)

## 2017-09-02 LAB — CBC WITH DIFFERENTIAL/PLATELET
BASOS ABS: 0 10*3/uL (ref 0.0–0.1)
Basophils Relative: 0 %
EOS ABS: 0 10*3/uL (ref 0.0–0.7)
EOS PCT: 0 %
HCT: 52.9 % — ABNORMAL HIGH (ref 39.0–52.0)
Hemoglobin: 17.3 g/dL — ABNORMAL HIGH (ref 13.0–17.0)
Lymphocytes Relative: 3 %
Lymphs Abs: 0.3 10*3/uL — ABNORMAL LOW (ref 0.7–4.0)
MCH: 31.6 pg (ref 26.0–34.0)
MCHC: 32.7 g/dL (ref 30.0–36.0)
MCV: 96.5 fL (ref 78.0–100.0)
Monocytes Absolute: 0.7 10*3/uL (ref 0.1–1.0)
Monocytes Relative: 5 %
Neutro Abs: 12.3 10*3/uL — ABNORMAL HIGH (ref 1.7–7.7)
Neutrophils Relative %: 92 %
PLATELETS: 113 10*3/uL — AB (ref 150–400)
RBC: 5.48 MIL/uL (ref 4.22–5.81)
RDW: 14.4 % (ref 11.5–15.5)
WBC: 13.4 10*3/uL — AB (ref 4.0–10.5)

## 2017-09-02 LAB — MAGNESIUM
MAGNESIUM: 1.6 mg/dL — AB (ref 1.7–2.4)
MAGNESIUM: 2.2 mg/dL (ref 1.7–2.4)
Magnesium: 2.4 mg/dL (ref 1.7–2.4)

## 2017-09-02 LAB — TYPE AND SCREEN
ABO/RH(D): O POS
Antibody Screen: NEGATIVE

## 2017-09-02 LAB — MRSA PCR SCREENING: MRSA by PCR: NEGATIVE

## 2017-09-02 LAB — HEPARIN LEVEL (UNFRACTIONATED): Heparin Unfractionated: 0.36 IU/mL (ref 0.30–0.70)

## 2017-09-02 LAB — APTT: APTT: 35 s (ref 24–36)

## 2017-09-02 LAB — TRIGLYCERIDES: Triglycerides: 86 mg/dL (ref <150)

## 2017-09-02 LAB — LACTIC ACID, PLASMA
LACTIC ACID, VENOUS: 1.9 mmol/L (ref 0.5–1.9)
LACTIC ACID, VENOUS: 2.2 mmol/L — AB (ref 0.5–1.9)

## 2017-09-02 MED ORDER — PANTOPRAZOLE SODIUM 40 MG PO PACK
40.0000 mg | PACK | Freq: Every day | ORAL | Status: DC
  Administered 2017-09-02 – 2017-09-03 (×2): 40 mg
  Filled 2017-09-02 (×2): qty 20

## 2017-09-02 MED ORDER — VANCOMYCIN HCL IN DEXTROSE 750-5 MG/150ML-% IV SOLN
750.0000 mg | Freq: Two times a day (BID) | INTRAVENOUS | Status: DC
  Administered 2017-09-02 (×2): 750 mg via INTRAVENOUS
  Filled 2017-09-02 (×3): qty 150

## 2017-09-02 MED ORDER — VITAL HIGH PROTEIN PO LIQD
1000.0000 mL | ORAL | Status: DC
  Administered 2017-09-02: 1000 mL

## 2017-09-02 MED ORDER — HEPARIN BOLUS VIA INFUSION
4000.0000 [IU] | Freq: Once | INTRAVENOUS | Status: AC
  Administered 2017-09-02: 4000 [IU] via INTRAVENOUS
  Filled 2017-09-02: qty 4000

## 2017-09-02 MED ORDER — CHLORHEXIDINE GLUCONATE 0.12% ORAL RINSE (MEDLINE KIT)
15.0000 mL | Freq: Two times a day (BID) | OROMUCOSAL | Status: DC
  Administered 2017-09-02 – 2017-09-08 (×7): 15 mL via OROMUCOSAL

## 2017-09-02 MED ORDER — VITAL 1.5 CAL PO LIQD
1000.0000 mL | ORAL | Status: DC
  Administered 2017-09-02 – 2017-09-04 (×2): 1000 mL
  Filled 2017-09-02 (×5): qty 1000

## 2017-09-02 MED ORDER — VITAL HIGH PROTEIN PO LIQD: 1000.0000 mL | ORAL | Status: DC

## 2017-09-02 MED ORDER — CHLORHEXIDINE GLUCONATE 0.12% ORAL RINSE (MEDLINE KIT)
15.0000 mL | Freq: Two times a day (BID) | OROMUCOSAL | Status: DC
Start: 2017-09-02 — End: 2017-09-02
  Administered 2017-09-02: 15 mL via OROMUCOSAL

## 2017-09-02 MED ORDER — PROPOFOL 1000 MG/100ML IV EMUL
0.0000 ug/kg/min | INTRAVENOUS | Status: DC
  Administered 2017-09-02: 35 ug/kg/min via INTRAVENOUS
  Administered 2017-09-02: 15 ug/kg/min via INTRAVENOUS
  Administered 2017-09-02: 35 ug/kg/min via INTRAVENOUS
  Administered 2017-09-03 (×2): 15 ug/kg/min via INTRAVENOUS
  Filled 2017-09-02 (×5): qty 100

## 2017-09-02 MED ORDER — LACTATED RINGERS IV SOLN
INTRAVENOUS | Status: DC
  Administered 2017-09-02 (×2): via INTRAVENOUS
  Administered 2017-09-03: 75 mL/h via INTRAVENOUS
  Administered 2017-09-06: 06:00:00 via INTRAVENOUS

## 2017-09-02 MED ORDER — PRO-STAT SUGAR FREE PO LIQD
30.0000 mL | Freq: Two times a day (BID) | ORAL | Status: DC
  Administered 2017-09-02 – 2017-09-03 (×4): 30 mL
  Filled 2017-09-02 (×5): qty 30

## 2017-09-02 MED ORDER — ORAL CARE MOUTH RINSE
15.0000 mL | Freq: Four times a day (QID) | OROMUCOSAL | Status: DC
  Administered 2017-09-02: 15 mL via OROMUCOSAL

## 2017-09-02 MED ORDER — DOCUSATE SODIUM 50 MG/5ML PO LIQD: 100.0000 mg | Freq: Two times a day (BID) | ORAL | Status: DC | PRN

## 2017-09-02 MED ORDER — PNEUMOCOCCAL VAC POLYVALENT 25 MCG/0.5ML IJ INJ
0.5000 mL | INJECTION | INTRAMUSCULAR | Status: AC
  Administered 2017-09-05: 0.5 mL via INTRAMUSCULAR
  Filled 2017-09-02: qty 0.5

## 2017-09-02 MED ORDER — ORAL CARE MOUTH RINSE
15.0000 mL | OROMUCOSAL | Status: DC
  Administered 2017-09-02 – 2017-09-04 (×21): 15 mL via OROMUCOSAL

## 2017-09-02 MED ORDER — PIPERACILLIN-TAZOBACTAM 3.375 G IVPB
3.3750 g | Freq: Three times a day (TID) | INTRAVENOUS | Status: DC
  Administered 2017-09-02 – 2017-09-03 (×4): 3.375 g via INTRAVENOUS
  Filled 2017-09-02 (×5): qty 50

## 2017-09-02 MED ORDER — MIDAZOLAM HCL 5 MG/5ML IJ SOLN
INTRAMUSCULAR | Status: AC
  Filled 2017-09-02: qty 5

## 2017-09-02 MED ORDER — SODIUM CHLORIDE 0.9 % IV SOLN
Freq: Once | INTRAVENOUS | Status: AC
  Administered 2017-09-02: via INTRAVENOUS

## 2017-09-02 MED ORDER — INSULIN ASPART 100 UNIT/ML ~~LOC~~ SOLN
2.0000 [IU] | SUBCUTANEOUS | Status: DC
  Administered 2017-09-02: 2 [IU] via SUBCUTANEOUS
  Administered 2017-09-02: 6 [IU] via SUBCUTANEOUS
  Administered 2017-09-02: 4 [IU] via SUBCUTANEOUS
  Administered 2017-09-02: 6 [IU] via SUBCUTANEOUS
  Administered 2017-09-02: 4 [IU] via SUBCUTANEOUS
  Administered 2017-09-02: 6 [IU] via SUBCUTANEOUS
  Administered 2017-09-03: 4 [IU] via SUBCUTANEOUS
  Administered 2017-09-03: 6 [IU] via SUBCUTANEOUS
  Administered 2017-09-03: 4 [IU] via SUBCUTANEOUS
  Administered 2017-09-03: 2 [IU] via SUBCUTANEOUS
  Administered 2017-09-03: 6 [IU] via SUBCUTANEOUS
  Administered 2017-09-03: 4 [IU] via SUBCUTANEOUS
  Administered 2017-09-04: 6 [IU] via SUBCUTANEOUS
  Administered 2017-09-04 (×2): 4 [IU] via SUBCUTANEOUS
  Administered 2017-09-04 – 2017-09-05 (×3): 2 [IU] via SUBCUTANEOUS
  Administered 2017-09-05: 4 [IU] via SUBCUTANEOUS
  Administered 2017-09-05 (×2): 6 [IU] via SUBCUTANEOUS
  Administered 2017-09-06: 2 [IU] via SUBCUTANEOUS
  Administered 2017-09-06: 4 [IU] via SUBCUTANEOUS
  Administered 2017-09-06: 6 [IU] via SUBCUTANEOUS
  Administered 2017-09-07 – 2017-09-08 (×2): 4 [IU] via SUBCUTANEOUS

## 2017-09-02 MED ORDER — FUROSEMIDE 10 MG/ML IJ SOLN
20.0000 mg | Freq: Once | INTRAMUSCULAR | Status: AC
  Administered 2017-09-02: 20 mg via INTRAVENOUS
  Filled 2017-09-02: qty 2

## 2017-09-02 MED ORDER — FENTANYL CITRATE (PF) 100 MCG/2ML IJ SOLN
50.0000 ug | INTRAMUSCULAR | Status: DC | PRN
  Administered 2017-09-03 (×2): 50 ug via INTRAVENOUS
  Filled 2017-09-02 (×3): qty 2

## 2017-09-02 MED ORDER — NOREPINEPHRINE BITARTRATE 1 MG/ML IV SOLN
0.0000 ug/min | INTRAVENOUS | Status: DC
  Administered 2017-09-02: 10 ug/min via INTRAVENOUS
  Administered 2017-09-03: 2 ug/min via INTRAVENOUS
  Filled 2017-09-02 (×2): qty 4

## 2017-09-02 MED ORDER — MAGNESIUM SULFATE 4 GM/100ML IV SOLN
4.0000 g | Freq: Once | INTRAVENOUS | Status: AC
Start: 2017-09-02 — End: 2017-09-02
  Administered 2017-09-02: 4 g via INTRAVENOUS
  Filled 2017-09-02: qty 100

## 2017-09-02 MED ORDER — MIDAZOLAM HCL 5 MG/5ML IJ SOLN
2.0000 mg | Freq: Once | INTRAMUSCULAR | Status: AC
  Administered 2017-09-02: 2 mg via INTRAVENOUS

## 2017-09-02 MED ORDER — HEPARIN (PORCINE) IN NACL 100-0.45 UNIT/ML-% IJ SOLN
1050.0000 [IU]/h | INTRAMUSCULAR | Status: DC
  Administered 2017-09-02 – 2017-09-03 (×2): 1100 [IU]/h via INTRAVENOUS
  Administered 2017-09-04: 1250 [IU]/h via INTRAVENOUS
  Administered 2017-09-05: 1050 [IU]/h via INTRAVENOUS
  Filled 2017-09-02 (×5): qty 250

## 2017-09-02 MED ORDER — HEPARIN SODIUM (PORCINE) 5000 UNIT/ML IJ SOLN: 5000.0000 [IU] | Freq: Three times a day (TID) | INTRAMUSCULAR | Status: DC

## 2017-09-02 MED ORDER — FENTANYL CITRATE (PF) 100 MCG/2ML IJ SOLN
50.0000 ug | INTRAMUSCULAR | Status: DC | PRN
  Administered 2017-09-03 – 2017-09-04 (×3): 50 ug via INTRAVENOUS
  Filled 2017-09-02 (×2): qty 2

## 2017-09-02 MED ORDER — METHYLPREDNISOLONE SODIUM SUCC 125 MG IJ SOLR
60.0000 mg | Freq: Four times a day (QID) | INTRAMUSCULAR | Status: DC
  Administered 2017-09-02 – 2017-09-03 (×5): 60 mg via INTRAVENOUS
  Filled 2017-09-02 (×5): qty 2

## 2017-09-02 MED ORDER — PROPOFOL 1000 MG/100ML IV EMUL
INTRAVENOUS | Status: AC
  Filled 2017-09-02: qty 100

## 2017-09-02 MED ORDER — IPRATROPIUM-ALBUTEROL 0.5-2.5 (3) MG/3ML IN SOLN
3.0000 mL | Freq: Four times a day (QID) | RESPIRATORY_TRACT | Status: DC
  Administered 2017-09-02 – 2017-09-04 (×11): 3 mL via RESPIRATORY_TRACT
  Filled 2017-09-02 (×12): qty 3

## 2017-09-02 MED ORDER — SODIUM CHLORIDE 0.9 % IV SOLN: 250.0000 mL | INTRAVENOUS | Status: DC | PRN

## 2017-09-02 MED ORDER — INFLUENZA VAC SPLIT HIGH-DOSE 0.5 ML IM SUSY
0.5000 mL | PREFILLED_SYRINGE | INTRAMUSCULAR | Status: AC
  Administered 2017-09-05: 0.5 mL via INTRAMUSCULAR
  Filled 2017-09-02: qty 0.5

## 2017-09-02 NOTE — Progress Notes (Addendum)
CRITICAL VALUE ALERT  Critical Value: troponin 1.41  Date & Time Notied:  09/02/17 4:31 AM  Provider Notified: Dr. Jimmy Footman via [place]  Orders Received/Actions taken: no new orders at this time. EKG done on admission, see in results.

## 2017-09-02 NOTE — ED Notes (Signed)
Pink foamy liquid coming from ET tube, EDP notified.  Respiratory suctioned.

## 2017-09-02 NOTE — Plan of Care (Signed)
Problem: Health Behavior/Discharge Planning: Goal: Ability to manage health-related needs will improve Outcome: Not Progressing Will need encouraagment  Problem: Fluid Volume: Goal: Hemodynamic stability will improve Outcome: Progressing Off levophed  Problem: Respiratory: Goal: Ability to maintain adequate ventilation will improve Outcome: Not Progressing Continues on vent  Problem: Nutritional: Goal: Intake of prescribed amount of daily calories will improve Outcome: Progressing Will start Tube feedings today  Comments: Off restraints, on mittens now. Tolerating vent fairly well.

## 2017-09-02 NOTE — Progress Notes (Signed)
Called NP regarding Troponin levels, Ventilator setting, volumes. NO new orders, discussed having echocardiogram done as early as possible, echo tech paged.

## 2017-09-02 NOTE — Progress Notes (Signed)
  Echocardiogram 2D Echocardiogram has been performed.  Johny Chess 09/02/2017, 2:47 PM

## 2017-09-02 NOTE — Progress Notes (Signed)
Pharmacy Antibiotic Note  Kenneth Mills is a 66 y.o. male admitted on 09/01/2017 with pneumonia.  Pharmacy has been consulted for Vancomycin/Zosyn dosing. WBC WNL. Resp failure requiring intubation. Noted renal dysfunction.   Plan: Vancomycin 750 mg IV q12h Zosyn 3.375G IV q8h to be infused over 4 hours Trend WBC, temp, renal function  F/U infectious work-up Drug levels as indicated   Height: 5' 11"  (180.3 cm) Weight: 176 lb 2.4 oz (79.9 kg) IBW/kg (Calculated) : 75.3  Temp (24hrs), Avg:100.1 F (37.8 C), Min:97.7 F (36.5 C), Max:102.8 F (39.3 C)   Recent Labs Lab 09/01/17 2155 09/01/17 2247  WBC 9.9  --   CREATININE 1.58*  --   LATICACIDVEN  --  3.17*    Estimated Creatinine Clearance: 49 mL/min (A) (by C-G formula based on SCr of 1.58 mg/dL (H)).    No Known Allergies   Kenneth Mills 09/02/2017 3:15 AM

## 2017-09-02 NOTE — Progress Notes (Signed)
[facility] Progress Note Patient Name: Kenneth Mills DOB: 03-13-1951 MRN: [id number]   Date of Service  09/02/2017  HPI/Events of Note  hypomag  eICU Interventions  Mag replaced     Intervention Category Intermediate Interventions: Electrolyte abnormality - evaluation and management  DETERDING,ELIZABETH 09/02/2017, 4:21 AM

## 2017-09-02 NOTE — Progress Notes (Signed)
Initial Nutrition Assessment  DOCUMENTATION CODES:  Not applicable  INTERVENTION:  Initiate TF via OGT with Vital 1.5 at goal rate of 55 ml/h (1320 ml per day) and Prostat 30 ml BID to provide 2180 kcals (+172 from diprivan), 119 gm protein, 1008 ml free water daily.  NUTRITION DIAGNOSIS:  Inadequate oral intake related to inability to eat as evidenced by NPO status.  GOAL:  Patient will meet greater than or equal to 90% of their needs  MONITOR:  Diet advancement, Vent status, Labs, Weight trends, I & O's, TF tolerance  REASON FOR ASSESSMENT:  Consult Enteral/tube feeding initiation and management  ASSESSMENT:  66 y/o male PMHx COPD, Colon surgery. Presented with 3 days of SOB, fever, cough, chills. Found to have acute hypercapnic respiratory failure and septic shock w/ evidence of PNA on CXR. Tired on Bipap and ultimately intubated. RD consulted for TF.   Patient intubated, on lose dose propofol. Son is at bedside. Son states it is his understanding the patient was eating well PTA. He did not think the patient took any vitamins or followed and type of therapeutic diet.   There is essentially no past weight history in chart. He believes the patients UBW is ~165 lbs. He does think the patient looks thinner, particular in his shoulders.   Son notes the patient has a history of IBS and has had a section of his bowel removed ~12 -13 years ago.   Physical Exam: Mild-moderate muscle loss of deltoids and clavicular musculature. All other areas WDL. Abdomen is slightly distended.   Patient is currently intubated on ventilator support MV:15.5  L/min Temp (24hrs), Avg:100 F (37.8 C), Min:97.7 F (36.5 C), Max:102.8 F (39.3 C)  Propofol: 6.5 ml/hr =172 kcals/day  Meds: IV abx, propofol, Pressor SUpport w/ Levo, IVF Labs: Albumin: 3.2, Elevated LFTs, BUN/Creat:12/1.31, Mag corrected, TG 86   NUTRITION - FOCUSED PHYSICAL EXAM:   Most Recent Value  Orbital Region  No depletion   Upper Arm Region  No depletion  Thoracic and Lumbar Region  No depletion  Buccal Region  No depletion  Temple Region  No depletion  Clavicle Bone Region  Mild depletion  Clavicle and Acromion Bone Region  Mild depletion  Scapular Bone Region  No depletion  Dorsal Hand  No depletion  Patellar Region  No depletion  Anterior Thigh Region  No depletion  Posterior Calf Region  No depletion     Diet Order:  Diet NPO time specified  EDUCATION NEEDS:  No education needs have been identified at this time  Skin:  Skin Assessment: Reviewed RN Assessment  Last BM:  10/27  Height:  Ht Readings from Last 1 Encounters:  09/01/17 5' 11"  (1.803 m)   Weight:  Wt Readings from Last 1 Encounters:  09/02/17 176 lb 2.4 oz (79.9 kg)   Ideal Body Weight:     BMI:  Body mass index is 24.57 kg/m.  Estimated Nutritional Needs:  Kcal:  2370 kcals (PSU 2003b) Protein:  112-128 g pro (1.4-1.6 g/kg bw) Fluid:  Per MD  Burtis Junes RD, LDN, CNSC Clinical Nutrition Pager: [phone] 09/02/2017 11:53 AM

## 2017-09-02 NOTE — Progress Notes (Signed)
[facility] AM Rounding / Follow Up    S:  RN reports pt remains on propofol.  Has periods of intermittent agitation.  Weaned off levophed this am / BP holding.  No acute events overnight.  Tmax 102.8 since admit.  5L positive since admit.    O: Blood pressure 100/76, pulse 100, temperature 100.2 F (37.9 C), temperature source Oral, resp. rate (!) 25, height 5' 11"  (1.803 m), weight 176 lb 2.4 oz (79.9 kg), SpO2 100 %.  General: adult male in NAD on mechanical ventilation HEENT: MM pink/moist, ETT Neuro: sedate on propofol CV: s1s2 rrr, no m/r/g PULM: even/non-labored, lungs bilaterally clear anterior, diminished bases  LT:JQZE, non-tender, bsx4 active  Extremities: warm/dry, no edema, SCD's in place.  Skin: no rashes or lesions  CBC    Component Value Date/Time   WBC 13.4 (H) 09/02/2017 0259   RBC 5.48 09/02/2017 0259   HGB 17.3 (H) 09/02/2017 0259   HCT 52.9 (H) 09/02/2017 0259   PLT 113 (L) 09/02/2017 0259   MCV 96.5 09/02/2017 0259   MCH 31.6 09/02/2017 0259   MCHC 32.7 09/02/2017 0259   RDW 14.4 09/02/2017 0259   LYMPHSABS 0.3 (L) 09/02/2017 0259   MONOABS 0.7 09/02/2017 0259   EOSABS 0.0 09/02/2017 0259   BASOSABS 0.0 09/02/2017 0259   CMP     Component Value Date/Time   NA 139 09/02/2017 0259   K 4.6 09/02/2017 0259   CL 112 (H) 09/02/2017 0259   CO2 16 (L) 09/02/2017 0259   GLUCOSE 181 (H) 09/02/2017 0259   BUN 12 09/02/2017 0259   CREATININE 1.31 (H) 09/02/2017 0259   CALCIUM 7.3 (L) 09/02/2017 0259   PROT 5.8 (L) 09/02/2017 0259   ALBUMIN 3.2 (L) 09/02/2017 0259   AST 217 (H) 09/02/2017 0259   ALT 110 (H) 09/02/2017 0259   ALKPHOS 117 09/02/2017 0259   BILITOT 1.5 (H) 09/02/2017 0259   GFRNONAA 55 (L) 09/02/2017 0259   GFRAA >60 09/02/2017 0259   PCXR 10/27    CULTURES RVP 10/26 >>  Influenza 10/26 >>  BCx2 10/26 >>  Sputum 10/26 >>  Urine 10/26 >>   ANTIBIOTICS Vanco 10/26 >> Zosyn 10/26 >>   LINES  ETT 10/26 >>   STUDIES   A: Acute Hypercapnic Respiratory Failure  COPD  HCAP  Septic Shock Secondary to HCAP  Elevated Troponin - likely demand ischemia in setting of respiratory distress  Rule Out CHF - ? Element of edema on CXR, elevated BNP Presumed AKI - unknown baseline  Hypomagnesemia  Hypokalemia Lactic Acidosis - likely secondary to hypotension & increased work of breathing / distress  Hyperglycemia  Thrombocytopenia  Elevated LFT's  Pain   P: Continue PRVC support.  ABG reviewed. Daily SBT / WUA  Assess AM CXR  Solumedrol 60 mg IV Q6 Duoneb Q6 + PRN albuterol  Propofol for sedation PRN fentanyl for pain  RASS Goal: 0 to -1  PRN colace with narcotics Continue abx as above Trend PCT  Follow cultures Assess RVP Droplet precautions KVO IVF > may need lasix given appearance of CXR  Await ECHO  Follow up AM labs > CMP, CBC, Mg, BNP Levophed PRN for MAP > 65 Begin TF later this afternoon Heparin, SCD's + PPI for prophylaxis   Additional CC Time:  30 minutes  Noe Gens, NP-[facility] Pgr: [phone] or if no answer [phone] 09/02/2017, 7:32 AM

## 2017-09-02 NOTE — Progress Notes (Signed)
Paged NP regarding echo being done.

## 2017-09-02 NOTE — Progress Notes (Signed)
ANTICOAGULATION CONSULT NOTE Pharmacy Consult for Heparin Indication: chest pain/ACS  No Known Allergies  Patient Measurements: Height: 5' 11"  (180.3 cm) Weight: 176 lb 2.4 oz (79.9 kg) IBW/kg (Calculated) : 75.3  Vital Signs: Temp: 99.8 F (37.7 C) (10/27 1900) Temp Source: Oral (10/27 1900) BP: 87/63 (10/27 2200) Pulse Rate: 97 (10/27 2200)  Labs:  Recent Labs  09/01/17 2155 09/02/17 0259 09/02/17 0928 09/02/17 1630 09/02/17 2156  HGB 17.3* 17.3*  --   --   --   HCT 53.5* 52.9*  --   --   --   PLT 129* 113*  --   --   --   APTT  --  35  --   --   --   LABPROT  --  14.6  --   --   --   INR  --  1.15  --   --   --   HEPARINUNFRC  --   --   --   --  0.36  CREATININE 1.58* 1.31*  --   --   --   TROPONINI 0.44* 1.41* 2.57* 2.03*  --     Estimated Creatinine Clearance: 59.1 mL/min (A) (by C-G formula based on SCr of 1.31 mg/dL (H)).  Assessment: 66 year old male with elevated cardiac markers for heparin  Goal of Therapy:  Heparin level 0.3-0.7 units/ml Monitor platelets by anticoagulation protocol: Yes   Plan:  Continue Heparin at current rate Follow-up am labs.   Phillis Knack, PharmD, BCPS  09/02/2017,11:03 PM

## 2017-09-02 NOTE — H&P (Signed)
Reason for ICU admission: acute hypercapnic resp failure due to COPD exacerbation / pneumonia  In summary: 66 yo male with past Hx of COPD presented to an OSH because of 3 days Hx of increased shortness of breath, fever, cough and chills. Patient found to have acute hypercapnic resp failure required intubation and evidence of pneumonia on CXR  On assessment: BP 117/92 on norepi, HR 101, RR 28, o2 SAT 99% on 60% and PEEP 5  Physical Exam  Constitutional: He appears well-developed and well-nourished. Not in distress.  HENT:  Head: Normocephalic and atraumatic. intubated Eyes: Pupils are equal, round, and reactive to light. EOM are normal.  Neck: Normal range of motion. Neck supple. No JVD present.  Cardiovascular: Regular rhythm.  Tachycardia  Pulmonary/Chest:Prolonged expiratory phase and end expiratory wheezes especially in the left lung fields.  Rhonchi in left lung base.  Abdominal: Soft. Bowel sounds are normal. There is no tenderness. There is no rebound and no guarding.  Musculoskeletal: Normal range of motion. He exhibits no edema or tenderness.  No lower extremity swelling or asymmetry.  Distal pulses intact.  Lymphadenopathy:    He has no cervical adenopathy.  Neurological: He is alert and oriented to person, place, and time.  Skin: Skin is warm and dry. No rash noted. No erythema.    I reviewed lab work from an OSH and ordered STAT labs and CXR to be reviewed   Patient is critically ill in the ICU and I am managing the patient for the following 1. Acute hypercapnic resp failure: intubated on mechanical ventilation, get repeat ABGs and CXR 2. Septic shock due to HCAP: fluid resuscitation, repeat cultures, continue on vanco and zosyn. Repeat lactic acid 3. COPD ex: IV steroids and dua-neb, broad abs 4. Pain control by prn ental. Patient is sedated by propofol 5. SQ insulin for steroids induced hyperglycemia  6. Elevated troponin, get EKG, ECHO, follow troponin trend  I spent  45 min of CC time managing the patient   Maisie Fus CCM attending

## 2017-09-02 NOTE — Progress Notes (Signed)
ANTICOAGULATION CONSULT NOTE - Initial Consult  Pharmacy Consult for Heparin Indication: chest pain/ACS  No Known Allergies  Patient Measurements: Height: 5' 11"  (180.3 cm) Weight: 176 lb 2.4 oz (79.9 kg) IBW/kg (Calculated) : 75.3  Vital Signs: Temp: 99.4 F (37.4 C) (10/27 0800) Temp Source: Oral (10/27 0800) BP: 84/67 (10/27 1200) Pulse Rate: 92 (10/27 1200)  Labs:  Recent Labs  09/01/17 2155 09/02/17 0259 09/02/17 0928  HGB 17.3* 17.3*  --   HCT 53.5* 52.9*  --   PLT 129* 113*  --   APTT  --  35  --   LABPROT  --  14.6  --   INR  --  1.15  --   CREATININE 1.58* 1.31*  --   TROPONINI 0.44* 1.41* 2.57*    Estimated Creatinine Clearance: 59.1 mL/min (A) (by C-G formula based on SCr of 1.31 mg/dL (H)).   Medical History: Past Medical History:  Diagnosis Date  . COPD (chronic obstructive pulmonary disease) ([facility])     Assessment: 66 year old male starting heparin for positive troponin levels  Goal of Therapy:  Heparin level 0.3-0.7 units/ml Monitor platelets by anticoagulation protocol: Yes   Plan:  Heparin 4000 units iv bolus x 1 Heparin drip at 1100 units / hr Heparin level 6 hours after heparin starts Daily heparin level, CBC  Thank you Anette Guarneri, PharmD [phone]  09/02/2017,1:35 PM

## 2017-09-03 ENCOUNTER — Inpatient Hospital Stay (HOSPITAL_COMMUNITY): Payer: Medicare HMO

## 2017-09-03 DIAGNOSIS — I5021 Acute systolic (congestive) heart failure: Secondary | ICD-10-CM

## 2017-09-03 DIAGNOSIS — I350 Nonrheumatic aortic (valve) stenosis: Secondary | ICD-10-CM

## 2017-09-03 DIAGNOSIS — J9601 Acute respiratory failure with hypoxia: Secondary | ICD-10-CM

## 2017-09-03 LAB — CBC
HEMATOCRIT: 46.1 % (ref 39.0–52.0)
Hemoglobin: 15.1 g/dL (ref 13.0–17.0)
MCH: 31.1 pg (ref 26.0–34.0)
MCHC: 32.8 g/dL (ref 30.0–36.0)
MCV: 94.9 fL (ref 78.0–100.0)
Platelets: 96 10*3/uL — ABNORMAL LOW (ref 150–400)
RBC: 4.86 MIL/uL (ref 4.22–5.81)
RDW: 14.6 % (ref 11.5–15.5)
WBC: 15.9 10*3/uL — ABNORMAL HIGH (ref 4.0–10.5)

## 2017-09-03 LAB — COMPREHENSIVE METABOLIC PANEL
ALBUMIN: 2.8 g/dL — AB (ref 3.5–5.0)
ALT: 58 U/L (ref 17–63)
AST: 60 U/L — AB (ref 15–41)
Alkaline Phosphatase: 78 U/L (ref 38–126)
Anion gap: 9 (ref 5–15)
BUN: 19 mg/dL (ref 6–20)
CHLORIDE: 108 mmol/L (ref 101–111)
CO2: 22 mmol/L (ref 22–32)
Calcium: 8.1 mg/dL — ABNORMAL LOW (ref 8.9–10.3)
Creatinine, Ser: 1.07 mg/dL (ref 0.61–1.24)
GFR calc Af Amer: >60 mL/min (ref >60)
GFR calc non Af Amer: >60 mL/min (ref >60)
GLUCOSE: 217 mg/dL — AB (ref 65–99)
POTASSIUM: 3.8 mmol/L (ref 3.5–5.1)
Sodium: 139 mmol/L (ref 135–145)
Total Bilirubin: 0.8 mg/dL (ref 0.3–1.2)
Total Protein: 5.3 g/dL — ABNORMAL LOW (ref 6.5–8.1)

## 2017-09-03 LAB — URINE CULTURE: CULTURE: NO GROWTH

## 2017-09-03 LAB — LEGIONELLA PNEUMOPHILA SEROGP 1 UR AG: L. pneumophila Serogp 1 Ur Ag: NEGATIVE

## 2017-09-03 LAB — GLUCOSE, CAPILLARY
GLUCOSE-CAPILLARY: 174 mg/dL — AB (ref 65–99)
GLUCOSE-CAPILLARY: 147 mg/dL — AB (ref 65–99)
Glucose-Capillary: 178 mg/dL — ABNORMAL HIGH (ref 65–99)
Glucose-Capillary: 187 mg/dL — ABNORMAL HIGH (ref 65–99)
Glucose-Capillary: 207 mg/dL — ABNORMAL HIGH (ref 65–99)
Glucose-Capillary: 241 mg/dL — ABNORMAL HIGH (ref 65–99)

## 2017-09-03 LAB — TROPONIN I: Troponin I: 1.5 ng/mL (ref <0.03)

## 2017-09-03 LAB — PHOSPHORUS: PHOSPHORUS: 2.3 mg/dL — AB (ref 2.5–4.6)

## 2017-09-03 LAB — MAGNESIUM: MAGNESIUM: 2.2 mg/dL (ref 1.7–2.4)

## 2017-09-03 LAB — HEPARIN LEVEL (UNFRACTIONATED): Heparin Unfractionated: 0.27 IU/mL — ABNORMAL LOW (ref 0.30–0.70)

## 2017-09-03 LAB — BRAIN NATRIURETIC PEPTIDE: B Natriuretic Peptide: 1167.7 pg/mL — ABNORMAL HIGH (ref 0.0–100.0)

## 2017-09-03 MED ORDER — SODIUM CHLORIDE 0.9% FLUSH
10.0000 mL | INTRAVENOUS | Status: DC | PRN
  Administered 2017-09-03: 20 mL
  Filled 2017-09-03: qty 40

## 2017-09-03 MED ORDER — SODIUM CHLORIDE 0.9% FLUSH
10.0000 mL | Freq: Two times a day (BID) | INTRAVENOUS | Status: DC
  Administered 2017-09-03 – 2017-09-07 (×8): 10 mL
  Administered 2017-09-07: 20 mL
  Administered 2017-09-08: 10 mL

## 2017-09-03 MED ORDER — ATORVASTATIN CALCIUM 80 MG PO TABS
80.0000 mg | ORAL_TABLET | Freq: Every day | ORAL | Status: DC
  Administered 2017-09-03 – 2017-09-07 (×5): 80 mg via ORAL
  Filled 2017-09-03 (×5): qty 1

## 2017-09-03 MED ORDER — CHLORHEXIDINE GLUCONATE CLOTH 2 % EX PADS: 6.0000 | MEDICATED_PAD | Freq: Every day | CUTANEOUS | Status: DC

## 2017-09-03 MED ORDER — CHLORHEXIDINE GLUCONATE CLOTH 2 % EX PADS
6.0000 | MEDICATED_PAD | Freq: Every day | CUTANEOUS | Status: DC
  Administered 2017-09-04 – 2017-09-07 (×4): 6 via TOPICAL

## 2017-09-03 MED ORDER — ALBUTEROL SULFATE (2.5 MG/3ML) 0.083% IN NEBU: 2.5000 mg | INHALATION_SOLUTION | RESPIRATORY_TRACT | Status: DC | PRN

## 2017-09-03 MED ORDER — ASPIRIN 81 MG PO CHEW
81.0000 mg | CHEWABLE_TABLET | Freq: Every day | ORAL | Status: DC
  Administered 2017-09-04 – 2017-09-08 (×4): 81 mg via ORAL
  Filled 2017-09-03 (×4): qty 1

## 2017-09-03 MED ORDER — POTASSIUM PHOSPHATES 15 MMOLE/5ML IV SOLN
40.0000 meq | Freq: Once | INTRAVENOUS | Status: AC
  Administered 2017-09-03: 40 meq via INTRAVENOUS
  Filled 2017-09-03: qty 9.09

## 2017-09-03 MED ORDER — METHYLPREDNISOLONE SODIUM SUCC 40 MG IJ SOLR
40.0000 mg | Freq: Two times a day (BID) | INTRAMUSCULAR | Status: DC
  Administered 2017-09-03 – 2017-09-05 (×4): 40 mg via INTRAVENOUS
  Filled 2017-09-03 (×4): qty 1

## 2017-09-03 MED ORDER — FUROSEMIDE 10 MG/ML IJ SOLN
40.0000 mg | Freq: Once | INTRAMUSCULAR | Status: AC
  Administered 2017-09-03: 40 mg via INTRAVENOUS
  Filled 2017-09-03: qty 4

## 2017-09-03 MED ORDER — ASPIRIN 81 MG PO CHEW
81.0000 mg | CHEWABLE_TABLET | ORAL | Status: AC
  Administered 2017-09-03: 81 mg via ORAL
  Filled 2017-09-03: qty 1

## 2017-09-03 MED ORDER — DEXTROSE 5 % IV SOLN
1.0000 g | INTRAVENOUS | Status: AC
  Administered 2017-09-03 – 2017-09-07 (×5): 1 g via INTRAVENOUS
  Filled 2017-09-03 (×5): qty 10

## 2017-09-03 MED ORDER — FUROSEMIDE 10 MG/ML IJ SOLN
20.0000 mg | Freq: Once | INTRAMUSCULAR | Status: AC
  Administered 2017-09-03: 20 mg via INTRAVENOUS
  Filled 2017-09-03: qty 2

## 2017-09-03 MED ORDER — DEXTROSE 5 % IV SOLN
500.0000 mg | INTRAVENOUS | Status: DC
  Administered 2017-09-03 – 2017-09-05 (×3): 500 mg via INTRAVENOUS
  Filled 2017-09-03 (×3): qty 500

## 2017-09-03 NOTE — Progress Notes (Signed)
Dr. Wallis Bamberg notified of pt's BNP.  Orders received.  Will continue to monitor closely.

## 2017-09-03 NOTE — Progress Notes (Signed)
Called IR to notify them of order for repositioning of PICC line.

## 2017-09-03 NOTE — Consult Note (Signed)
Advanced Heart Failure Team Consult Note   Referring Provider: B. Ollis NP-C (CCM team) Primary Cardiologist:  None  Reason for Consultation: Acute systolic HF  HPI:    Kenneth Mills is seen today for evaluation of acute systolic HF at the request of Noe Gens, NP with CCM team  66 y/o male with h/o presumed COPD (2ppd ongoing), IBD s/p partial colectomy presented to OSH with 2 weeks of increasing dyspnea. Found to have acute respiratory failure with diffuse infiltrates on CXR. Intubated and transferred to [facility]. Titers + for parainfluenzae. PCT elevated. Started on abx and steroids.   Echo 09/02/17: EF 15% diffuse HK Normal RV. Critical low-gradient AS  Remains intubated with good mechanics. Diuresing with IV lasix.  Troponing ~2.0 and flat. ECG with sinus tach and LBBB.   Review of Systems: [y] = yes, [ ]  = no - from family and chart as patient intubated  General: Weight gain [ ] ; Weight loss [ ] ; Anorexia [ ] ; Fatigue [ y]; Fever [ ] ; Chills [ ] ; Weakness [ ]   Cardiac: Chest pain/pressure [ ] ; Resting SOB Blue.Reese ]; Exertional SOB Blue.Reese ]; Orthopnea [ ] ; Pedal Edema [ ] ; Palpitations [ ] ; Syncope [ ] ; Presyncope [ ] ; Paroxysmal nocturnal dyspnea[ ]   Pulmonary: Cough [ y]; [facility] ]; Hemoptysis[ ] ; Sputum [ ] ; Snoring [ ]   GI: Vomiting[ ] ; Dysphagia[ ] ; Melena[ ] ; Hematochezia [ ] ; Heartburn[ ] ; Abdominal pain [ ] ; Constipation [ ] ; Diarrhea [ ] ; BRBPR [ ]   GU: Hematuria[ ] ; Dysuria [ ] ; Nocturia[ ]   Vascular: Pain in legs with walking [ ] ; Pain in feet with lying flat [ ] ; Non-healing sores [ ] ; Stroke [ ] ; TIA [ ] ; Slurred speech [ ] ;  Neuro: Headaches[ ] ; Vertigo[ ] ; Seizures[ ] ; Paresthesias[ ] ;Blurred vision [ ] ; Diplopia [ ] ; Vision changes [ ]   Ortho/Skin: Arthritis Blue.Reese ]; Joint pain [ y]; Muscle pain [ ] ; Joint swelling [ ] ; Back Pain [ ] ; Rash [ ]   Psych: Depression[ ] ; Anxiety[ ]   Heme: Bleeding problems [ ] ; Clotting disorders [ ] ; Anemia [ ]   Endocrine: Diabetes [ ] ;  Thyroid dysfunction[ ]   Home Medications Prior to Admission medications   Medication Sig Start Date End Date Taking? Authorizing Provider  aspirin EC 325 MG tablet Take 325 mg by mouth daily as needed (for pain or headaches).    Yes [provider]  clonazePAM (KLONOPIN) 0.5 MG tablet Take 0.5 mg by mouth daily as needed for anxiety.  06/05/17  Yes [provider]    Past Medical History: Past Medical History:  Diagnosis Date  . COPD (chronic obstructive pulmonary disease) ([place])     Past Surgical History: Past Surgical History:  Procedure Laterality Date  . COLON SURGERY    . HERNIA REPAIR      Family History: No family history on file.  Social History: Social History   Social History  . Marital status: Married    Spouse name: N/A  . Number of children: N/A  . Years of education: N/A   Social History Main Topics  . Smoking status: Current Every Day Smoker    Types: Cigarettes  . Smokeless tobacco: Never Used  . Alcohol use Yes  . Drug use: Unknown  . Sexual activity: Not Asked   Other Topics Concern  . None   Social History Narrative  . None    Allergies:  No Known Allergies  Objective:    Vital Signs:   Temp:  [  98.3 F (36.8 C)-99.8 F (37.7 C)] 98.5 F (36.9 C) (10/28 0715) Pulse Rate:  [84-131] 130 (10/28 0815) Resp:  [20-30] 23 (10/28 0815) BP: (71-134)/(54-93) 116/86 (10/28 0815) SpO2:  [98 %-100 %] 100 % (10/28 0815) FiO2 (%):  [40 %] 40 % (10/28 0800) Weight:  [79.8 kg (175 lb 14.8 oz)] 79.8 kg (175 lb 14.8 oz) (10/28 0500) Last BM Date: 09/02/17  Weight change: Filed Weights   09/01/17 2326 09/02/17 0220 09/03/17 0500  Weight: 72.6 kg (160 lb) 79.9 kg (176 lb 2.4 oz) 79.8 kg (175 lb 14.8 oz)    Intake/Output:   Intake/Output Summary (Last 24 hours) at 09/03/17 0942 Last data filed at 09/03/17 0539  Gross per 24 hour  Intake          3629.17 ml  Output             3755 ml  Net          -125.83 ml      Physical  Exam    General:  Intubated awake on vent follows commands HEENT: normal x ETT Neck: supple. JVP 7-8 . Carotids 2+ bilat; no bruits. No lymphadenopathy or thyromegaly appreciated. Cor: PMI nondisplaced. Very distant. Regular. I cannot hear AS murmur or s2 Lungs: clear Abdomen: soft, nontender, nondistended. No hepatosplenomegaly. No bruits or masses. Good bowel sounds. Extremities: no cyanosis, clubbing, rash, edema Neuro: alert & orientedx3, cranial nerves grossly intact. moves all 4 extremities w/o difficulty. Affect pleasant   Telemetry   Sinus 100-120 Personally reviewed   EKG    Sinus tach 103 LBBB Personally reviewed   Labs   Basic Metabolic Panel:  Recent Labs Lab 09/01/17 2155 09/02/17 0259 09/02/17 0928 09/02/17 1630 09/03/17 0139  NA 139 139  --   --  139  K 3.4* 4.6  --   --  3.8  CL 105 112*  --   --  108  CO2 21* 16*  --   --  22  GLUCOSE 257* 181*  --   --  217*  BUN 15 12  --   --  19  CREATININE 1.58* 1.31*  --   --  1.07  CALCIUM 8.5* 7.3*  --   --  8.1*  MG  --  1.6* 2.4 2.2 2.2  PHOS  --  2.7 3.0 2.2* 2.3*    Liver Function Tests:  Recent Labs Lab 09/01/17 2155 09/02/17 0259 09/03/17 0139  AST 128* 217* 60*  ALT 68* 110* 58  ALKPHOS 132* 117 78  BILITOT 0.9 1.5* 0.8  PROT 7.3 5.8* 5.3*  ALBUMIN 4.1 3.2* 2.8*   No results for input(s): LIPASE, AMYLASE in the last 168 hours. No results for input(s): AMMONIA in the last 168 hours.  CBC:  Recent Labs Lab 09/01/17 2155 09/02/17 0259 09/03/17 0139  WBC 9.9 13.4* 15.9*  NEUTROABS 4.8 12.3*  --   HGB 17.3* 17.3* 15.1  HCT 53.5* 52.9* 46.1  MCV 97.1 96.5 94.9  PLT 129* 113* 96*    Cardiac Enzymes:  Recent Labs Lab 09/01/17 2155 09/02/17 0259 09/02/17 0928 09/02/17 1630 09/03/17 0139  TROPONINI 0.44* 1.41* 2.57* 2.03* 1.50*    BNP: BNP (last 3 results)  Recent Labs  09/01/17 2155 09/03/17 0139  BNP 885.5* 1,167.7*    ProBNP (last 3 results) No results for  input(s): PROBNP in the last 8760 hours.   CBG:  Recent Labs Lab 09/02/17 1148 09/02/17 1649 09/02/17 1944 09/03/17 0332 09/03/17 7673  GLUCAP  200* 220* 187* 207* 178*    Coagulation Studies:  Recent Labs  09/02/17 0259  LABPROT 14.6  INR 1.15     Imaging   Dg Chest Port 1 View  Result Date: 09/03/2017 CLINICAL DATA:  Acute respiratory failure. EXAM: PORTABLE CHEST 1 VIEW COMPARISON:  09/02/2017 FINDINGS: Endotracheal tube terminates just below the clavicular heads, well above the carina. Enteric tube courses into the left upper abdomen with tip not imaged. The cardiac silhouette remains enlarged. Bilateral interstitial and airspace opacities are greatest in the bases and have improved from the prior study. The right hemidiaphragm is now visible. No large pleural effusion or pneumothorax is identified. IMPRESSION: Decreased bilateral lung opacities which may indicate edema, though superimposed infection is also possible. Electronically Signed   By: Logan Bores M.D.   On: 09/03/2017 07:21      Medications:     Current Medications: . [START ON 09/04/2017] aspirin  81 mg Oral Daily  . chlorhexidine gluconate (MEDLINE KIT)  15 mL Mouth Rinse BID  . feeding supplement (PRO-STAT SUGAR FREE 64)  30 mL Per Tube BID  . Influenza vac split quadrivalent PF  0.5 mL Intramuscular Tomorrow-1000  . insulin aspart  2-6 Units Subcutaneous Q4H  . ipratropium-albuterol  3 mL Nebulization Q6H  . mouth rinse  15 mL Mouth Rinse 10 times per day  . methylPREDNISolone (SOLU-MEDROL) injection  60 mg Intravenous Q6H  . pantoprazole sodium  40 mg Per Tube Q1200  . pneumococcal 23 valent vaccine  0.5 mL Intramuscular Tomorrow-1000     Infusions: . sodium chloride    . feeding supplement (VITAL 1.5 CAL) 1,000 mL (09/03/17 0600)  . heparin 1,250 Units/hr (09/03/17 0905)  . lactated ringers Stopped (09/03/17 8119)  . norepinephrine (LEVOPHED) Adult infusion Stopped (09/03/17 0600)  .  piperacillin-tazobactam (ZOSYN)  IV 3.375 g (09/03/17 0618)  . potassium PHOSPHATE IVPB (mEq) 40 mEq (09/03/17 0836)  . propofol (DIPRIVAN) infusion 15.019 mcg/kg/min (09/03/17 0600)  . vancomycin 750 mg (09/03/17 0936)       Patient Profile   66 y/o with presumed COPD with ongoing tobacco, IBD s/p previous partial colectomy admitted with acute respiratory failure. Parainfluenza and PCT +. Echo with EF 15% and critical low-gradient AS   Assessment/Plan   1. Acute hypoxic respiratory failure - likely multifactorial COPD, parainfluenzae and acute HF - Continue diuresis - Vent wean per CCM  2. Acute systolic HF - Echo reviewed personally EF 15%. RV normal. Likely critical low gradient AS but may also have component of ischemic CM  - Given normal RV function suspect severe LV dysfunction represents end-stage low-gradient AS +/- ischemic CM and not viral cardiomyopathy. - Will place PICC to follow CVP and co-ox - Will need R/L cath this week - Depending on coronary status will review with TAVR/structural team  3. Probable low-gradient critical aortic stenosis - As above  4. AKI - resolved.   5. Elevated troponin - possible ischemia versus HF - continue ASA, statin. No b-blocker with acute HF & respiratory failure - cath this week.    Length of Stay: 2  Glori Bickers, MD  09/03/2017, 9:42 AM  Advanced Heart Failure Team Pager [phone] (M-F; 7a - 4p)  Please contact [facility] for night-coverage after hours (4p -7a ) and weekends on [email or web address]

## 2017-09-03 NOTE — Progress Notes (Signed)
ANTICOAGULATION CONSULT NOTE Pharmacy Consult for Heparin Indication: chest pain/ACS  No Known Allergies  Patient Measurements: Height: 5' 11"  (180.3 cm) Weight: 175 lb 14.8 oz (79.8 kg) IBW/kg (Calculated) : 75.3  Vital Signs: Temp: 98.5 F (36.9 C) (10/28 0715) Temp Source: Oral (10/28 0715) BP: 134/93 (10/28 0600) Pulse Rate: 131 (10/28 0600)  Labs:  Recent Labs  09/01/17 2155 09/02/17 0259 09/02/17 0928 09/02/17 1630 09/02/17 2156 09/03/17 0139  HGB 17.3* 17.3*  --   --   --  15.1  HCT 53.5* 52.9*  --   --   --  46.1  PLT 129* 113*  --   --   --  96*  APTT  --  35  --   --   --   --   LABPROT  --  14.6  --   --   --   --   INR  --  1.15  --   --   --   --   HEPARINUNFRC  --   --   --   --  0.36 0.27*  CREATININE 1.58* 1.31*  --   --   --  1.07  TROPONINI 0.44* 1.41* 2.57* 2.03*  --  1.50*    Estimated Creatinine Clearance: 72.3 mL/min (by C-G formula based on SCr of 1.07 mg/dL).  Assessment: 66 year old male with elevated cardiac markers for heparin. Heparin level below goal this morning at 0.27. No bleeding issues noted but hgb and pltc are down somewhat from admit.  Goal of Therapy:  Heparin level 0.3-0.7 units/ml Monitor platelets by anticoagulation protocol: Yes   Plan:  Increase heparin to 1250 units/hr Daily heparin level and cbc  Erin Hearing PharmD., BCPS Clinical Pharmacist Pager [phone] 09/03/2017 8:25 AM

## 2017-09-03 NOTE — Progress Notes (Signed)
PICC placed.  Unable to get PICC line to advance in the svc, with curling noted at the tip questioned.  CXR ordered for placement. Dr Chase Caller at bedside notified of difficulty with insertion.  CXR viewed by MD and self.  Recommended referral to IR for fluor.  Good blood return present, but not ok to use for medications until seen by IR.  RN also at bedside aware.

## 2017-09-03 NOTE — Progress Notes (Signed)
PULMONARY / CRITICAL CARE MEDICINE   Name: Kenneth Mills MRN: [id number] DOB: 08-24-1951    ADMISSION DATE:  09/01/2017 CONSULTATION DATE:  09/01/17  REFERRING MD:  Dr. Lita Mains  CHIEF COMPLAINT:  SOB  BRIEF SUMMARY:  66 y/o M, smoker (50 years, up to 2ppd, currently 0.5-1ppd) who was admitted 10/26 with a two week history progressive shortness of breath.  His son reports that he does not go to the doctor often and only uses a "nerve pill".  He is followed at [facility].  He failed BiPAP and required intubation.  The initial working diagnosis was HCAP +/- CHF.  ECHO assessment revealed an LVEF of 15% and severe AS.    PMH - IBS with bowel obstruction requiring resection, smoking, suspected COPD (no PFT's), anxiety.  Brothers deceased in 33's from emphysema. [place], [place] own [place] etc.    SUBJECTIVE:  RN reports no acute events overnight.  Tmax 99.8 / WBC 15.9.  Pt weaning on PSV 5/5 without difficulty.  Pt on low dose propofol.  Denies pain, nods yes to anxiety.   VITAL SIGNS: BP (!) 134/93   Pulse (!) 131   Temp 98.5 F (36.9 C) (Oral)   Resp (!) 30   Ht 5' 11"  (1.803 m)   Wt 175 lb 14.8 oz (79.8 kg)   SpO2 100%   BMI 24.54 kg/m   HEMODYNAMICS:    VENTILATOR SETTINGS: Vent Mode: PSV;CPAP FiO2 (%):  [40 %] 40 % Set Rate:  [16 bmp] 16 bmp Vt Set:  [450 mL] 450 mL PEEP:  [5 cmH20] 5 cmH20 Pressure Support:  [5 cmH20] 5 cmH20  INTAKE / OUTPUT: I/O last 3 completed shifts: In: 9773.9 [I.V.:2786.4; Other:120; NG/GT:1017.5; IV Piggyback:5850] Out: 3662 [HUTML:4650; Emesis/NG output:100; Other:200]  PHYSICAL EXAMINATION: General: adult male in NAD on vent, son at bedside HEENT: MM pink/moist, ETT PSY: calm Neuro: Opens eyes to voice, follows commands / appears appropriate CV: s1s2 rrr, no m/r/g PULM: even/non-labored, lungs bilaterally clear  PT:WSFK, non-tender, bsx4 active  Extremities: warm/dry, trace LE edema  Skin: no rashes or  lesions  LABS:  BMET  Recent Labs Lab 09/01/17 2155 09/02/17 0259 09/03/17 0139  NA 139 139 139  K 3.4* 4.6 3.8  CL 105 112* 108  CO2 21* 16* 22  BUN 15 12 19   CREATININE 1.58* 1.31* 1.07  GLUCOSE 257* 181* 217*    Electrolytes  Recent Labs Lab 09/01/17 2155  09/02/17 0259 09/02/17 0928 09/02/17 1630 09/03/17 0139  CALCIUM 8.5*  --  7.3*  --   --  8.1*  MG  --   < > 1.6* 2.4 2.2 2.2  PHOS  --   < > 2.7 3.0 2.2* 2.3*  < > = values in this interval not displayed.  CBC  Recent Labs Lab 09/01/17 2155 09/02/17 0259 09/03/17 0139  WBC 9.9 13.4* 15.9*  HGB 17.3* 17.3* 15.1  HCT 53.5* 52.9* 46.1  PLT 129* 113* 96*    Coag's  Recent Labs Lab 09/02/17 0259  APTT 35  INR 1.15    Sepsis Markers  Recent Labs Lab 09/01/17 2247 09/02/17 0259 09/02/17 0602  LATICACIDVEN 3.17* 1.9 2.2*  PROCALCITON  --  4.53  --     ABG  Recent Labs Lab 09/01/17 2243 09/02/17 0033 09/02/17 0408  PHART 7.176* 7.212* 7.322*  PCO2ART 50.2* 42.6 36.1  PO2ART 100.0 74.0* 77.0*    Liver Enzymes  Recent Labs Lab 09/01/17 2155 09/02/17 0259 09/03/17  0139  AST 128* 217* 60*  ALT 68* 110* 58  ALKPHOS 132* 117 78  BILITOT 0.9 1.5* 0.8  ALBUMIN 4.1 3.2* 2.8*    Cardiac Enzymes  Recent Labs Lab 09/02/17 0928 09/02/17 1630 09/03/17 0139  TROPONINI 2.57* 2.03* 1.50*    Glucose  Recent Labs Lab 09/02/17 0306 09/02/17 1148 09/02/17 1649 09/02/17 1944 09/03/17 0332 09/03/17 0711  GLUCAP 138* 200* 220* 187* 207* 178*    Imaging Dg Chest Port 1 View  Result Date: 09/03/2017 CLINICAL DATA:  Acute respiratory failure. EXAM: PORTABLE CHEST 1 VIEW COMPARISON:  09/02/2017 FINDINGS: Endotracheal tube terminates just below the clavicular heads, well above the carina. Enteric tube courses into the left upper abdomen with tip not imaged. The cardiac silhouette remains enlarged. Bilateral interstitial and airspace opacities are greatest in the bases and have  improved from the prior study. The right hemidiaphragm is now visible. No large pleural effusion or pneumothorax is identified. IMPRESSION: Decreased bilateral lung opacities which may indicate edema, though superimposed infection is also possible. Electronically Signed   By: Logan Bores M.D.   On: 09/03/2017 07:21     STUDIES:  ECHO 10/27 >> severe LV dilation, LVEF 15%, diffuse hypokinesis, doppler parameters consistent with restrictive physiology, indicative of decreased L ventricular diastolic compliance or increased L atrial pressure, AV severely restricted, LA moderately dilated  CULTURES: RVP 10/26 >> parainfluenza positive Influenza 10/26 >>  BCx2 10/26 >>  Sputum 10/26 >>  Urine 10/26 >>   ANTIBIOTICS: Vanco 10/26 >> 10/28 Zosyn 10/26 >> 10/28 Rocephin 10/28 >>  Azithro 10/28 >>   SIGNIFICANT EVENTS: 10/26  Admit to [facility] with increasing SOB, intubated   LINES/TUBES: ETT 10/26 >>   DISCUSSION: 66 y/o M, smoker, admitted with increasing SOB.  Failed BiPAP, intubated.  Work up positive for parainfluenza and newly diagnosed LVEF 15%, severe AS.   ASSESSMENT / PLAN:  PULMONARY A: Acute Respiratory Failure - in setting of critical AS, parainfluenza positive, +/- RLL CAP Parainfluenza Positive COPD - not defined with PFT's CAP - ? RLL infiltrate vs edema Pulmonary Edema / CHF  P:   PRVC as rest mode  Daily SBT / WUA  Intermittent CXR  Lasix 40 mg IV x1 Reduce solumedrol to 40 mg IV BID Duoneb Q6 + PRN albuterol  PRN albuterol    Tamiflu not indicated for parainfluenza   CARDIOVASCULAR A:  Cardiomyopathy  Severe AS Hypotension - resolved, off pressors  Tachycardia  Elevated Troponin - likely demand ischemia in setting of respiratory distress P:  CHF SVC consultation, appreciate assistance Tele monitoring  ASA 81 mg QD  Heparin gtt per pharmacy   RENAL A:   AKI - in setting of hypotension Lactic Acidosis - resolving Hypomagnesemia   Hypophosphatemia Hypokalemia  P:   Trend BMP / urinary output Replace electrolytes as indicated, Kphos 10/28 Avoid nephrotoxic agents, ensure adequate renal perfusion Lasix + KCL as above KVO IVF   GASTROINTESTINAL A:   Elevated LFT's - resolving, suspect secondary to congestive hepatopathy / hypotension  P:   NPO  Continue TF  PPI for SUP   HEMATOLOGIC A:   Thrombocytopenia P:  Trend CBC  Heparin gtt as above   INFECTIOUS A:   ? RLL Airspace Disease  Parainfluenza Positive  P:   Trend WBC / fever curve  Monitor CXR  Pt has not been on abx in last 3 months or admitted > de-escalate abx to CAP coverage for now  ENDOCRINE A:   Hyperglycemia  P:   SSI with standard scale   NEUROLOGIC A:   Acute Encephalopathy - resolved  P:   RASS goal: 0 to -1  Propofol for sedation  PRN fentanyl for pain  Daily WUA / SBT    FAMILY  - Updates: Son Kuhnert) updated at bedside.  He states his father "would hate all of this" and would not want support unless there is an opportunity for recovery.    - Inter-disciplinary family meet or Palliative Care meeting due by:  11/5   CC Time: 66 minutes   Noe Gens, NP-C [facility] Pgr: [phone] or if no answer [phone] 09/03/2017, 7:48 AM

## 2017-09-03 NOTE — Progress Notes (Signed)
[place] Progress Note Patient Name: Kenneth Mills DOB: [birth year] MRN: [id number]   Date of Service  09/03/2017  HPI/Events of Note  B Naturetic Peptide = 1167. CXR from yesterday c/w heart failure. LVEF = 15%. Troponin bumped to 2.57 --> 2.03 --> 2.03.   eICU Interventions  Will order: 1. Lasix 20 mg IV X 1 now.      Intervention Category Major Interventions: Other:  Minnie, Shi 09/03/2017, 3:46 AM

## 2017-09-03 NOTE — Progress Notes (Signed)
Cardiology and CCM in to talk to patient and family, discuss a plan due to low EF> patient eyes opening frequently, on low dose propofol. Nods head to questions, follows commands.

## 2017-09-03 NOTE — Progress Notes (Signed)
Peripherally Inserted Central Catheter/Midline Placement  The IV Nurse has discussed with the patient and/or persons authorized to consent for the patient, the purpose of this procedure and the potential benefits and risks involved with this procedure.  The benefits include less needle sticks, lab draws from the catheter, and the patient may be discharged home with the catheter. Risks include, but not limited to, infection, bleeding, blood clot (thrombus formation), and puncture of an artery; nerve damage and irregular heartbeat and possibility to perform a PICC exchange if needed/ordered by physician.  Alternatives to this procedure were also discussed.  Bard Power PICC patient education guide, fact sheet on infection prevention and patient information card has been provided to patient /or left at bedside.  Pt agreed to PICC placement. Son signed consent per pt request and due to sedation.  PICC/Midline Placement Documentation  PICC Triple Lumen 74/16/38 PICC Left Cephalic 43 cm 0 cm (Active)  Indication for Insertion or Continuance of Line Chronic illness with exacerbations (CF, Sickle Cell, etc.);Prolonged intravenous therapies;Vasoactive infusions 09/03/2017  1:32 PM  Exposed Catheter (cm) 0 cm 09/03/2017  1:32 PM  Site Assessment Clean;Dry;Intact 09/03/2017  1:32 PM  Lumen #1 Status Flushed;Saline locked;Blood return noted 09/03/2017  1:32 PM  Lumen #2 Status Flushed;Saline locked;Blood return noted 09/03/2017  1:32 PM  Lumen #3 Status Flushed;Saline locked;Blood return noted 09/03/2017  1:32 PM  Dressing Type Transparent 09/03/2017  1:32 PM  Dressing Status Clean;Dry;Intact;Antimicrobial disc in place 09/03/2017  1:32 PM  Line Care Connections checked and tightened 09/03/2017  1:32 PM  Line Adjustment (NICU/IV Team Only) No 09/03/2017  1:32 PM  Dressing Intervention New dressing 09/03/2017  1:32 PM  Dressing Change Due 09/10/17 09/03/2017  1:32 PM       Rolena Infante 09/03/2017,  1:33 PM

## 2017-09-04 ENCOUNTER — Inpatient Hospital Stay (HOSPITAL_COMMUNITY): Payer: Medicare HMO

## 2017-09-04 ENCOUNTER — Other Ambulatory Visit: Payer: Self-pay

## 2017-09-04 ENCOUNTER — Encounter (HOSPITAL_COMMUNITY): Payer: Self-pay | Admitting: General Surgery

## 2017-09-04 HISTORY — PX: IR FLUORO GUIDE CV MIDLINE PICC LEFT: IMG5211

## 2017-09-04 LAB — CBC
HEMATOCRIT: 47.1 % (ref 39.0–52.0)
Hemoglobin: 15 g/dL (ref 13.0–17.0)
MCH: 31.1 pg (ref 26.0–34.0)
MCHC: 31.8 g/dL (ref 30.0–36.0)
MCV: 97.5 fL (ref 78.0–100.0)
Platelets: 99 10*3/uL — ABNORMAL LOW (ref 150–400)
RBC: 4.83 MIL/uL (ref 4.22–5.81)
RDW: 15.3 % (ref 11.5–15.5)
WBC: 14.4 10*3/uL — ABNORMAL HIGH (ref 4.0–10.5)

## 2017-09-04 LAB — HEPARIN LEVEL (UNFRACTIONATED): HEPARIN UNFRACTIONATED: 0.41 [IU]/mL (ref 0.30–0.70)

## 2017-09-04 LAB — BASIC METABOLIC PANEL
Anion gap: 8 (ref 5–15)
BUN: 29 mg/dL — ABNORMAL HIGH (ref 6–20)
CALCIUM: 8.5 mg/dL — AB (ref 8.9–10.3)
CO2: 30 mmol/L (ref 22–32)
CREATININE: 0.91 mg/dL (ref 0.61–1.24)
Chloride: 103 mmol/L (ref 101–111)
GFR calc non Af Amer: >60 mL/min (ref >60)
GLUCOSE: 197 mg/dL — AB (ref 65–99)
Potassium: 4.3 mmol/L (ref 3.5–5.1)
Sodium: 141 mmol/L (ref 135–145)

## 2017-09-04 LAB — GLUCOSE, CAPILLARY
GLUCOSE-CAPILLARY: 214 mg/dL — AB (ref 65–99)
GLUCOSE-CAPILLARY: 190 mg/dL — AB (ref 65–99)
GLUCOSE-CAPILLARY: 106 mg/dL — AB (ref 65–99)
GLUCOSE-CAPILLARY: 168 mg/dL — AB (ref 65–99)
Glucose-Capillary: 231 mg/dL — ABNORMAL HIGH (ref 65–99)
Glucose-Capillary: 204 mg/dL — ABNORMAL HIGH (ref 65–99)
Glucose-Capillary: 160 mg/dL — ABNORMAL HIGH (ref 65–99)
Glucose-Capillary: 136 mg/dL — ABNORMAL HIGH (ref 65–99)

## 2017-09-04 LAB — HEMOGLOBIN A1C
Hgb A1c MFr Bld: 6.2 % — ABNORMAL HIGH (ref 4.8–5.6)
MEAN PLASMA GLUCOSE: 131.24 mg/dL

## 2017-09-04 LAB — CULTURE, RESPIRATORY

## 2017-09-04 LAB — CULTURE, RESPIRATORY W GRAM STAIN: Culture: NORMAL

## 2017-09-04 LAB — COOXEMETRY PANEL
Carboxyhemoglobin: 0.7 % (ref 0.5–1.5)
METHEMOGLOBIN: 1.1 % (ref 0.0–1.5)
O2 Saturation: 73.3 %
Total hemoglobin: 17 g/dL — ABNORMAL HIGH (ref 12.0–16.0)

## 2017-09-04 LAB — MAGNESIUM: Magnesium: 2.1 mg/dL (ref 1.7–2.4)

## 2017-09-04 LAB — PHOSPHORUS: PHOSPHORUS: 3.1 mg/dL (ref 2.5–4.6)

## 2017-09-04 MED ORDER — CLONAZEPAM 0.5 MG PO TABS
0.2500 mg | ORAL_TABLET | Freq: Two times a day (BID) | ORAL | Status: DC | PRN
  Administered 2017-09-04 – 2017-09-07 (×4): 0.25 mg via ORAL
  Filled 2017-09-04 (×4): qty 1

## 2017-09-04 MED ORDER — CLONAZEPAM 0.5 MG PO TBDP: 0.5000 mg | ORAL_TABLET | Freq: Two times a day (BID) | ORAL | Status: DC

## 2017-09-04 MED ORDER — FUROSEMIDE 10 MG/ML IJ SOLN
40.0000 mg | Freq: Two times a day (BID) | INTRAMUSCULAR | Status: AC
  Administered 2017-09-04 (×2): 40 mg via INTRAVENOUS
  Filled 2017-09-04 (×2): qty 4

## 2017-09-04 MED ORDER — CLONAZEPAM 0.125 MG PO TBDP: 0.2500 mg | ORAL_TABLET | Freq: Two times a day (BID) | ORAL | Status: DC | PRN

## 2017-09-04 MED ORDER — LIDOCAINE HCL 1 % IJ SOLN
INTRAMUSCULAR | Status: AC
  Filled 2017-09-04: qty 20

## 2017-09-04 MED ORDER — IPRATROPIUM-ALBUTEROL 0.5-2.5 (3) MG/3ML IN SOLN
3.0000 mL | Freq: Three times a day (TID) | RESPIRATORY_TRACT | Status: DC
  Administered 2017-09-05 – 2017-09-07 (×6): 3 mL via RESPIRATORY_TRACT
  Filled 2017-09-04 (×7): qty 3

## 2017-09-04 MED ORDER — PANTOPRAZOLE SODIUM 40 MG IV SOLR
40.0000 mg | INTRAVENOUS | Status: DC
  Administered 2017-09-04: 40 mg via INTRAVENOUS
  Filled 2017-09-04: qty 40

## 2017-09-04 MED ORDER — FENTANYL CITRATE (PF) 100 MCG/2ML IJ SOLN: 25.0000 ug | INTRAMUSCULAR | Status: DC | PRN

## 2017-09-04 MED ORDER — CHLORHEXIDINE GLUCONATE 4 % EX LIQD
CUTANEOUS | Status: AC
  Filled 2017-09-04: qty 15

## 2017-09-04 NOTE — Procedures (Signed)
PICC line exchange completed with no issues. Left brachial access was kept and new line was placed at the level of the CA junction No complications  Mossie Gilder E 2:32 PM 09/04/2017

## 2017-09-04 NOTE — Progress Notes (Signed)
PULMONARY / CRITICAL CARE MEDICINE   Name: Kenneth Mills MRN: [id number] DOB: 08/05/1951    ADMISSION DATE:  09/01/2017 CONSULTATION DATE:  09/01/17  REFERRING MD:  Dr. Lita Mains  CHIEF COMPLAINT:  SOB  BRIEF SUMMARY:  66 y/o M, smoker (50 years, up to 2ppd, currently 0.5-1ppd) who was admitted 10/26 with a two week history progressive shortness of breath.  His son reports that he does not go to the doctor often and only uses a "nerve pill".  He is followed at [facility].  He failed BiPAP and required intubation.  The initial working diagnosis was HCAP +/- CHF.  ECHO assessment revealed an LVEF of 15% and severe AS.    PMH - IBS with bowel obstruction requiring resection, smoking, suspected COPD (no PFT's), anxiety.  Brothers deceased in 60's from emphysema. [place], [place] own [place] etc.    SUBJECTIVE:  RN reports no acute events overnight.  Tmax 99.6 / WBC 14.4.  Pt weaning on PSV 5/5 without difficulty. RR 16. He remains on low dose propofol. He appears anxious.He has been weaning through the night.  VITAL SIGNS: BP 107/77 (BP Location: Right Arm)   Pulse 84   Temp 97.6 F (36.4 C) (Axillary)   Resp 17   Ht 5' 11"  (1.803 m)   Wt 168 lb 6.9 oz (76.4 kg)   SpO2 100%   BMI 23.49 kg/m   HEMODYNAMICS:    VENTILATOR SETTINGS: Vent Mode: PSV;CPAP FiO2 (%):  [40 %] 40 % Set Rate:  [16 bmp] 16 bmp Vt Set:  [450 mL] 450 mL PEEP:  [5 cmH20] 5 cmH20 Pressure Support:  [5 cmH20-8 cmH20] 5 cmH20  INTAKE / OUTPUT: I/O last 3 completed shifts: In: 7989 [I.V.:1670.9; NG/GT:2155; IV Piggyback:1059.1] Out: 5970 [Urine:5970]  PHYSICAL EXAMINATION: General: adult male in NAD on vent, son and daughter at bedside HEENT: Normocephalic, atraumatic, MM pink/moist, oral  ETT, OG tube PSY: anxious Neuro: Eyes are open, follows commands / appears appropriate, MAE x 4 CV: s1s2 rrr, no m/r/g PULM: even/non-labored, lungs bilaterally clear, diminished per bases   QJ:JHER, non-tender, bsx4 active, TF on hold Extremities: warm/dry, trace LE edema  Skin: no rashes or lesions, warm dry and intact  LABS:  BMET  Recent Labs Lab 09/02/17 0259 09/03/17 0139 09/04/17 0511  NA 139 139 141  K 4.6 3.8 4.3  CL 112* 108 103  CO2 16* 22 30  BUN 12 19 29*  CREATININE 1.31* 1.07 0.91  GLUCOSE 181* 217* 197*    Electrolytes  Recent Labs Lab 09/02/17 0259  09/02/17 1630 09/03/17 0139 09/04/17 0511  CALCIUM 7.3*  --   --  8.1* 8.5*  MG 1.6*  < > 2.2 2.2 2.1  PHOS 2.7  < > 2.2* 2.3* 3.1  < > = values in this interval not displayed.  CBC  Recent Labs Lab 09/02/17 0259 09/03/17 0139 09/04/17 0511  WBC 13.4* 15.9* 14.4*  HGB 17.3* 15.1 15.0  HCT 52.9* 46.1 47.1  PLT 113* 96* 99*    Coag's  Recent Labs Lab 09/02/17 0259  APTT 35  INR 1.15    Sepsis Markers  Recent Labs Lab 09/01/17 2247 09/02/17 0259 09/02/17 0602  LATICACIDVEN 3.17* 1.9 2.2*  PROCALCITON  --  4.53  --     ABG  Recent Labs Lab 09/01/17 2243 09/02/17 0033 09/02/17 0408  PHART 7.176* 7.212* 7.322*  PCO2ART 50.2* 42.6 36.1  PO2ART 100.0 74.0* 77.0*    Liver Enzymes  Recent Labs Lab 09/01/17 2155 09/02/17 0259 09/03/17 0139  AST 128* 217* 60*  ALT 68* 110* 58  ALKPHOS 132* 117 78  BILITOT 0.9 1.5* 0.8  ALBUMIN 4.1 3.2* 2.8*    Cardiac Enzymes  Recent Labs Lab 09/02/17 0928 09/02/17 1630 09/03/17 0139  TROPONINI 2.57* 2.03* 1.50*    Glucose  Recent Labs Lab 09/03/17 1104 09/03/17 1512 09/03/17 1954 09/03/17 2332 09/04/17 0403 09/04/17 0754  GLUCAP 174* 241* 147* 187* 204* 190*    Imaging Dg Chest Port 1 View  Result Date: 09/04/2017 CLINICAL DATA:  Respiratory failure, COPD, current smoker, intubated patient. EXAM: PORTABLE CHEST 1 VIEW COMPARISON:  Portable chest x-ray of September 03, 2017 FINDINGS: The lungs are well-expanded. The interstitial markings remain increased. The cardiac silhouette remains enlarged and  the pulmonary vascularity mildly engorged. The retrocardiac region on the left remains dense. There is a small left pleural effusion. Slightly increased density is noted medially at the right lung base. There is no pneumothorax. There is calcification in the wall of the aortic arch. The endotracheal tube tip lies 3.5 cm above the carina. The esophagogastric tube tip lies in the gastric cardia. The proximal port lies at the GE junction. IMPRESSION: COPD with superimposed CHF. Left lower lobe atelectasis or pneumonia with possible early similar changes at the right base. Small left pleural effusion. Advancement of the esophagogastric tube by 5-10 cm would assure that the proximal port is indeed below the GE junction. Thoracic aortic atheroscleroses. Electronically Signed   By: David  Martinique M.D.   On: 09/04/2017 07:28   Dg Chest Port 1 View  Result Date: 09/03/2017 CLINICAL DATA:  S/P PICC central line placement. Image reviewed by Dr. Chase Caller. Hx of COPD. Pt is a current smoker. EXAM: PORTABLE CHEST - 1 VIEW COMPARISON:  Earlier film of the same day FINDINGS: Left arm PICC line loops in the innominate vein. Endotracheal tube and nasogastric tube are stable in position. Heart size upper limits normal.  Atheromatous aorta. Some improvement in the perihilar and bibasilar interstitial and airspace edema or infiltrates. Right costophrenic angle is excluded. IMPRESSION: 1. Malpositioned PICC, looped in the left innominate vein. 2. Some interval improvement in bilateral edema or infiltrates. Electronically Signed   By: Lucrezia Europe M.D.   On: 09/03/2017 13:55     STUDIES:  ECHO 10/27 >> severe LV dilation, LVEF 15%, diffuse hypokinesis, doppler parameters consistent with restrictive physiology, indicative of decreased L ventricular diastolic compliance or increased L atrial pressure, AV severely restricted, LA moderately dilated  CULTURES: RVP 10/26 >> parainfluenza positive Influenza 10/26 >>  BCx2 10/26 >>   Sputum 10/26 >>  Urine 10/26 >> No Growth  ANTIBIOTICS: Vanco 10/26 >> 10/28 Zosyn 10/26 >> 10/28 Rocephin 10/28 >>  Azithro 10/28 >>   SIGNIFICANT EVENTS: 10/26  Admit to [facility] with increasing SOB, intubated  10/29>> Extubated  LINES/TUBES: ETT 10/26 >>   DISCUSSION: 66 y/o M, smoker, admitted with increasing SOB.  Failed BiPAP, intubated.  Work up positive for parainfluenza and newly diagnosed LVEF 15%, severe AS. Weaning well on 5/5 10/29. Weight Down 7 pounds, CXR indicates continued CHF.  ASSESSMENT / PLAN:  PULMONARY A: Acute Respiratory Failure - in setting of critical AS, parainfluenza positive, +/- RLL CAP Parainfluenza Positive COPD - not defined with PFT's CAP - ? RLL infiltrate vs edema Pulmonary Edema / CHF  Weaning well 10/29 Will need pulmonary follow up after discharge  P:    Daily SBT / WUA  Intermittent CXR  Lasix 40 BID  10/29 Reduce solumedrol to 40 mg IV BID Duoneb Q6 + PRN albuterol  PRN albuterol    Tamiflu not indicated for parainfluenza  Extubate 09/04/2017 Swallow eval Mobilize as able Aggressive pulmonary toilet IS  CARDIOVASCULAR A:  Cardiomyopathy  Severe AS Hypotension - resolved, off pressors  Tachycardia - resolved Elevated Troponin - likely demand ischemia in setting of respiratory distress>> down trending P:  CHF SVC cards consultation, appreciate assistance Continue diuresis as above Tele monitoring  Map Goal  > 65 No BB for now No ARB/Spiro per cards Will need R/L cath this week per cards Depending on coronary structure cards will review with TAVR/structural team  RENAL A:   AKI - in setting of hypotension Lactic Acidosis - resolving Hypomagnesemia >> Resolved >> repleted 10/28 Hypophosphatemia>> resolved repleted 10/28 Hypokalemia  P:   Trend BMP / urinary output Replace electrolytes as indicated Avoid nephrotoxic agents, ensure adequate renal perfusion Lasix as above KVO IVF   GASTROINTESTINAL A:    Elevated LFT's - resolving, suspect secondary to congestive hepatopathy / hypotension  TF on hold hopeful for extubation P:   NPO  Continue TF  PPI for SUP  Trend CMET  HEMATOLOGIC A:   Thrombocytopenia P:  Trend CBC  Heparin gtt as above Monitor for any obvious bleeding   INFECTIOUS A:   ? RLL Airspace Disease  Parainfluenza Positive  P:   Trend WBC / fever curve  Monitor CXR  Follow Micro Re-culture is clinically indicated Pt has not been on abx in last 3 months or admitted > de-escalate abx to CAP coverage for now  ENDOCRINE A:   Hyperglycemia   P:   CBG's Q 4 SSI with standard scale   NEUROLOGIC A:   Acute Encephalopathy - resolved  Takes klonopin at home ( per patient take one 0.25 tab per week as needed) P:   RASS goal: 0 to -1  PRN fentanyl for pain  Restart low dose klonopen prn   FAMILY  - Updates: Family at bedside,anxious for patient to be extubated. Son  states his father "would hate all of this" and would not want support unless there is an opportunity for recovery.    - Inter-disciplinary family meet or Palliative Care meeting due by:  11/5   CC Time: 11 minutes  Magdalen Spatz, AGACNP-BC [facility] Pgr: [phone] 09/04/2017, 9:11 AM

## 2017-09-04 NOTE — Evaluation (Signed)
Clinical/Bedside Swallow Evaluation Patient Details  Name: Kenneth Mills MRN: [id number] Date of Birth: Feb 19, 1951  Today's Date: 09/04/2017 Time: SLP Start Time (ACUTE ONLY): 59 SLP Stop Time (ACUTE ONLY): 1633 SLP Time Calculation (min) (ACUTE ONLY): 17 min  Past Medical History:  Past Medical History:  Diagnosis Date  . COPD (chronic obstructive pulmonary disease) ([employer])    Past Surgical History:  Past Surgical History:  Procedure Laterality Date  . COLON SURGERY    . HERNIA REPAIR    . IR FLUORO GUIDE CV MIDLINE PICC LEFT  09/04/2017   HPI:  66 y/o M, smoker (50 years, up to 2ppd, currently 0.5-1ppd) who was admitted 10/26 with a two week history progressive shortness of breath. His son reports that he does not go to the doctor often and only uses a "nerve pill". He is followed at [facility]. He failed BiPAP and required intubation. The initial working diagnosis was HCAP +/- CHF. ECHO assessment revealed an LVEF of 15% and severe AS. Intubated from 10/26 to 10/29. Pt also found to have parainfluenza.    Assessment / Plan / Recommendation Clinical Impression  Extubated this am, vocal quality clear with adequate intensity and denies odonophagia. Multiple trials thin via cup and straw did not reveal throat clear, cough or wet vocal quality although silent aspiration risk increases with more than 2 day intubation. Mastication of solid functional however with start slower with Dys 3 texture, thin liquids, straws allowed (advised to remove straws if coughing) and pills with thin. SLP will follow up tomorrow for safety and upgrade if able.    SLP Visit Diagnosis: Dysphagia, unspecified (R13.10)    Aspiration Risk       Diet Recommendation Dysphagia 3 (Mech soft);Thin liquid   Liquid Administration via: Straw;Cup Medication Administration: Whole meds with liquid Supervision: Patient able to self feed Compensations: Slow rate;Small sips/bites Postural Changes: Seated  upright at 90 degrees    Other  Recommendations Oral Care Recommendations: Oral care BID   Follow up Recommendations None      Frequency and Duration min 2x/week  2 weeks       Prognosis Prognosis for Safe Diet Advancement: Good      Swallow Study   General HPI: 66 y/o M, smoker (50 years, up to 2ppd, currently 0.5-1ppd) who was admitted 10/26 with a two week history progressive shortness of breath. His son reports that he does not go to the doctor often and only uses a "nerve pill". He is followed at [facility]. He failed BiPAP and required intubation. The initial working diagnosis was HCAP +/- CHF. ECHO assessment revealed an LVEF of 15% and severe AS. Intubated from 10/26 to 10/29. Pt also found to have parainfluenza.  Type of Study: Bedside Swallow Evaluation Previous Swallow Assessment:  (none) Diet Prior to this Study: NPO Temperature Spikes Noted: No Respiratory Status: Nasal cannula History of Recent Intubation: Yes Length of Intubations (days): 4 days Date extubated: 09/04/17 (in am) Behavior/Cognition: Alert;Cooperative;Pleasant mood Oral Cavity Assessment: Within Functional Limits Oral Care Completed by SLP: No Oral Cavity - Dentition:  (missing lower posterior) Vision: Functional for self-feeding Self-Feeding Abilities: Able to feed self Patient Positioning: Upright in bed Baseline Vocal Quality: Normal Volitional Cough: Strong Volitional Swallow: Able to elicit    Oral/Motor/Sensory Function Overall Oral Motor/Sensory Function: Within functional limits   Ice Chips Ice chips: Not tested   Thin Liquid Thin Liquid: Impaired Presentation: Cup Oral Phase Impairments:  (none) Oral Phase Functional Implications:  (  none) Pharyngeal  Phase Impairments: Multiple swallows    Nectar Thick Nectar Thick Liquid: Not tested   Honey Thick Honey Thick Liquid: Not tested   Puree Puree: Within functional limits   Solid   GO   Solid: Within functional limits         Mick Sell, Orbie Pyo 09/04/2017,5:25 PM  Orbie Pyo [place].[employer] [phone]

## 2017-09-04 NOTE — Progress Notes (Signed)
ANTICOAGULATION CONSULT NOTE Pharmacy Consult for Heparin Indication: chest pain/ACS  No Known Allergies  Patient Measurements: Height: 5' 11"  (180.3 cm) Weight: 168 lb 6.9 oz (76.4 kg) IBW/kg (Calculated) : 75.3  Vital Signs: Temp: 97.6 F (36.4 C) (10/29 0752) Temp Source: Axillary (10/29 0752) BP: 125/98 (10/29 0900) Pulse Rate: 106 (10/29 0900)  Labs:  Recent Labs  09/02/17 0259 09/02/17 0928 09/02/17 1630 09/02/17 2156 09/03/17 0139 09/04/17 0511  HGB 17.3*  --   --   --  15.1 15.0  HCT 52.9*  --   --   --  46.1 47.1  PLT 113*  --   --   --  96* 99*  APTT 35  --   --   --   --   --   LABPROT 14.6  --   --   --   --   --   INR 1.15  --   --   --   --   --   HEPARINUNFRC  --   --   --  0.36 0.27* 0.41  CREATININE 1.31*  --   --   --  1.07 0.91  TROPONINI 1.41* 2.57* 2.03*  --  1.50*  --     Estimated Creatinine Clearance: 85 mL/min (by C-G formula based on SCr of 0.91 mg/dL).  Assessment: 66 year old male with elevated cardiac markers, no chest pain, may be demand ischemia. Heparin level was therapeutic this morning after increasing the drip yesterday. CBC is stable and no bleeding was noted.  Goal of Therapy:  Heparin level 0.3-0.7 units/ml Monitor platelets by anticoagulation protocol: Yes   Plan:  Continue heparin at 1250 units/hr Daily heparin level and [place] PharmD PGY1 Pharmacy Practice Resident 09/04/2017 10:03 AM Pager: [phone]

## 2017-09-04 NOTE — Procedures (Addendum)
Extubation Procedure Note  Patient Details:   Name: Kenneth Mills DOB: 1951-03-03 MRN: [id number]   Airway Documentation:     Evaluation  O2 sats: stable throughout Complications: No apparent complications Patient did tolerate procedure well. Bilateral Breath Sounds: Clear   Yes  MD ordered extubation. Positive for cuff leak. Placed on 3 lpm nasal cannula. No stridor heard. Incentive Spirometry setup and instructed. Pt achieved 1250 x3.   Ander Purpura 09/04/2017, 11:44 AM

## 2017-09-04 NOTE — Procedures (Signed)
Patient tolerated CPAP/PS for most of shift, with good VT/ RR.  Placed on resting settings early this AM.  Plan to wean again on day shift.

## 2017-09-04 NOTE — Progress Notes (Signed)
Advanced Heart Failure Rounding Note  PCP:  Primary Cardiologist: New. Dr Haroldine Laws   Subjective:    Yesterday diuresed with IV lasix. Weight down another 7 pounds. Stable off norepi.   Remains on the vent 40%. Follows commands.   ECHO EF 15%.  Low gradient aortic stenosis.    Objective:   Weight Range: 168 lb 6.9 oz (76.4 kg) Body mass index is 23.49 kg/m.   Vital Signs:   Temp:  [98.2 F (36.8 C)-99.6 F (37.6 C)] 98.3 F (36.8 C) (10/29 0404) Pulse Rate:  [67-132] 89 (10/29 0600) Resp:  [16-26] 23 (10/29 0600) BP: (79-119)/(55-90) 117/90 (10/29 0600) SpO2:  [98 %-100 %] 100 % (10/29 0600) FiO2 (%):  [40 %] 40 % (10/29 0430) Weight:  [168 lb 6.9 oz (76.4 kg)] 168 lb 6.9 oz (76.4 kg) (10/29 0300) Last BM Date: 09/02/17  Weight change: Filed Weights   09/02/17 0220 09/03/17 0500 09/04/17 0300  Weight: 176 lb 2.4 oz (79.9 kg) 175 lb 14.8 oz (79.8 kg) 168 lb 6.9 oz (76.4 kg)    Intake/Output:   Intake/Output Summary (Last 24 hours) at 09/04/17 0734 Last data filed at 09/04/17 0600  Gross per 24 hour  Intake          2956.61 ml  Output             3845 ml  Net          -888.39 ml      Physical Exam    General:  Intubated.  HEENT: Normal except ETT Neck: Supple. JVP 6-7 . Carotids 2+ bilat; no bruits. No lymphadenopathy or thyromegaly appreciated. Cor: PMI nondisplaced. Regular rate & rhythm. No rubs, gallops or murmurs. Lungs: Clear with decreased BS throughout  Abdomen: Soft, nontender, nondistended. No hepatosplenomegaly. No bruits or masses. Good bowel sounds. Extremities: No cyanosis, clubbing, rash, edema. RUE PICC  Neuro: Intubated.MAE x4. Follows commands.   Telemetry   NSR 80-90s   EKG    Sinus tach 103 LBBB Personally reviewed Labs    CBC  Recent Labs  09/01/17 2155 09/02/17 0259 09/03/17 0139 09/04/17 0511  WBC 9.9 13.4* 15.9* 14.4*  NEUTROABS 4.8 12.3*  --   --   HGB 17.3* 17.3* 15.1 15.0  HCT 53.5* 52.9* 46.1 47.1  MCV  97.1 96.5 94.9 97.5  PLT 129* 113* 96* 99*   Basic Metabolic Panel  Recent Labs  09/03/17 0139 09/04/17 0511  NA 139 141  K 3.8 4.3  CL 108 103  CO2 22 30  GLUCOSE 217* 197*  BUN 19 29*  CREATININE 1.07 0.91  CALCIUM 8.1* 8.5*  MG 2.2 2.1  PHOS 2.3* 3.1   Liver Function Tests  Recent Labs  09/02/17 0259 09/03/17 0139  AST 217* 60*  ALT 110* 58  ALKPHOS 117 78  BILITOT 1.5* 0.8  PROT 5.8* 5.3*  ALBUMIN 3.2* 2.8*   No results for input(s): LIPASE, AMYLASE in the last 72 hours. Cardiac Enzymes  Recent Labs  09/02/17 0928 09/02/17 1630 09/03/17 0139  TROPONINI 2.57* 2.03* 1.50*    BNP: BNP (last 3 results)  Recent Labs  09/01/17 2155 09/03/17 0139  BNP 885.5* 1,167.7*    ProBNP (last 3 results) No results for input(s): PROBNP in the last 8760 hours.   D-Dimer No results for input(s): DDIMER in the last 72 hours. Hemoglobin A1C  Recent Labs  09/04/17 0511  HGBA1C 6.2*   Fasting Lipid Panel  Recent Labs  09/02/17 0259  TRIG  86   Thyroid Function Tests No results for input(s): TSH, T4TOTAL, T3FREE, THYROIDAB in the last 72 hours.  Invalid input(s): FREET3  Other results:   Imaging    Dg Chest Port 1 View  Result Date: 09/04/2017 CLINICAL DATA:  Respiratory failure, COPD, current smoker, intubated patient. EXAM: PORTABLE CHEST 1 VIEW COMPARISON:  Portable chest x-ray of September 03, 2017 FINDINGS: The lungs are well-expanded. The interstitial markings remain increased. The cardiac silhouette remains enlarged and the pulmonary vascularity mildly engorged. The retrocardiac region on the left remains dense. There is a small left pleural effusion. Slightly increased density is noted medially at the right lung base. There is no pneumothorax. There is calcification in the wall of the aortic arch. The endotracheal tube tip lies 3.5 cm above the carina. The esophagogastric tube tip lies in the gastric cardia. The proximal port lies at the GE  junction. IMPRESSION: COPD with superimposed CHF. Left lower lobe atelectasis or pneumonia with possible early similar changes at the right base. Small left pleural effusion. Advancement of the esophagogastric tube by 5-10 cm would assure that the proximal port is indeed below the GE junction. Thoracic aortic atheroscleroses. Electronically Signed   By: David  Martinique M.D.   On: 09/04/2017 07:28   Dg Chest Port 1 View  Result Date: 09/03/2017 CLINICAL DATA:  S/P PICC central line placement. Image reviewed by Dr. Chase Caller. Hx of COPD. Pt is a current smoker. EXAM: PORTABLE CHEST - 1 VIEW COMPARISON:  Earlier film of the same day FINDINGS: Left arm PICC line loops in the innominate vein. Endotracheal tube and nasogastric tube are stable in position. Heart size upper limits normal.  Atheromatous aorta. Some improvement in the perihilar and bibasilar interstitial and airspace edema or infiltrates. Right costophrenic angle is excluded. IMPRESSION: 1. Malpositioned PICC, looped in the left innominate vein. 2. Some interval improvement in bilateral edema or infiltrates. Electronically Signed   By: Lucrezia Europe M.D.   On: 09/03/2017 13:55      Medications:     Scheduled Medications: . aspirin  81 mg Oral Daily  . atorvastatin  80 mg Oral q1800  . chlorhexidine gluconate (MEDLINE KIT)  15 mL Mouth Rinse BID  . Chlorhexidine Gluconate Cloth  6 each Topical Daily  . feeding supplement (PRO-STAT SUGAR FREE 64)  30 mL Per Tube BID  . Influenza vac split quadrivalent PF  0.5 mL Intramuscular Tomorrow-1000  . insulin aspart  2-6 Units Subcutaneous Q4H  . ipratropium-albuterol  3 mL Nebulization Q6H  . mouth rinse  15 mL Mouth Rinse 10 times per day  . methylPREDNISolone (SOLU-MEDROL) injection  40 mg Intravenous Q12H  . pantoprazole sodium  40 mg Per Tube Q1200  . pneumococcal 23 valent vaccine  0.5 mL Intramuscular Tomorrow-1000  . sodium chloride flush  10-40 mL Intracatheter Q12H     Infusions: .  sodium chloride    . azithromycin Stopped (09/03/17 1400)  . cefTRIAXone (ROCEPHIN)  IV Stopped (09/03/17 1143)  . feeding supplement (VITAL 1.5 CAL) 1,000 mL (09/04/17 0600)  . heparin 1,250 Units/hr (09/04/17 0600)  . lactated ringers Stopped (09/03/17 4235)  . norepinephrine (LEVOPHED) Adult infusion Stopped (09/03/17 0600)  . propofol (DIPRIVAN) infusion 10.013 mcg/kg/min (09/04/17 0600)     PRN Medications:  sodium chloride, albuterol, docusate, fentaNYL (SUBLIMAZE) injection, fentaNYL (SUBLIMAZE) injection, sodium chloride flush    Patient Profile  66 y/o with presumed COPD with ongoing tobacco, IBD s/p previous partial colectomy admitted with acute respiratory failure. Parainfluenza and  PCT +. Echo with EF 15% and critical low-gradient AS   Assessment/Plan   1. Acute hypoxic respiratory failure - likely multifactorial COPD, parainfluenzae and acute HF - Continue diuresis - Remains intubated. Vent wean per CCM  2. Acute systolic HF - EchEF 44%. RV normal. Likely critical low gradient AS but may also have component of ischemic CM  - Given normal RV function suspect severe LV dysfunction represents end-stage low-gradient AS +/- ischemic CM and not viral cardiomyopathy. -CXR with CHF. Give 40 mg IV lasix x2. Watch BP. Check CVP once PICC line adjusted.   - No BB for now.  -No ARB/Spiro.Anticipate adding losartan versus entresto tomorrow.  - Will need R/L cath this week - Depending on coronary status will review with TAVR/structural team - PICC line will be adjusted in IR later today.   3. Probable low-gradient critical aortic stenosis - As above  4. AKI - Creatinine 1.07. Stable.   5. Elevated troponin - possible ischemia versus HF. No CP.  - continue ASA, statin. No b-blocker with acute HF & respiratory failure - Plan for LHC later this week.    Length of Stay: 3   Amy Clegg, NP  09/04/2017, 7:34 AM  Advanced Heart Failure Team Pager [phone] (M-F; 7a -  4p)  Please contact [facility] for night-coverage after hours (4p -7a ) and weekends on [email or web address]  Patient seen and examined with Darrick Grinder, NP. We discussed all aspects of the encounter. I agree with the assessment and plan as stated above.   He is extubated now. Remains very anxious but otherwise without complaint. Gets very tachycardic with minimal movement. Denise SOB/CP. Co-ox drawn off PICC 73%.   Echo reviewed personally with Dr. Burt Knack of the TAVR team. EF 15% severe low-gradient AS. Will need R/L heart cath on Wednesday when more recovered from infectious standpoint. Will go to IR today for PICC repositioning. Follow CVP and co-ox. Would aggressive diuresis or afterload reduction in setting of severe AS.   D/w CCM.   Glori Bickers, MD  1:34 PM

## 2017-09-04 NOTE — Progress Notes (Signed)
Right upper arm, under BP cuff noted to have red streaks.  Directly distal to streaks diprivan infusing to right ac piv.  Right AC PIV and right FA PIV d/c'd.  Heat applied to arm.  No c/o of pain.  Radial pulse remains strong.  Will continue to monitor closely.

## 2017-09-04 NOTE — Progress Notes (Signed)
Per IV team Nurse Helene Kelp.  May flush ports of left upper arm PICC with saline to keep from clotting, may draw blood, but do not infuse anything through PICC line until IR repositions.

## 2017-09-04 NOTE — Progress Notes (Signed)
SLP Cancellation Note  Patient Details Name: KLINE BULTHUIS MRN: [id number] DOB: [birth year]   Cancelled treatment:       Reason Eval/Treat Not Completed: Patient at procedure or test/unavailable   Ryllie Nieland, Katherene Ponto 09/04/2017, 2:15 PM

## 2017-09-05 ENCOUNTER — Inpatient Hospital Stay (HOSPITAL_COMMUNITY): Payer: Medicare HMO

## 2017-09-05 LAB — COMPREHENSIVE METABOLIC PANEL
ALT: 40 U/L (ref 17–63)
ANION GAP: 9 (ref 5–15)
AST: 42 U/L — ABNORMAL HIGH (ref 15–41)
Albumin: 3.1 g/dL — ABNORMAL LOW (ref 3.5–5.0)
Alkaline Phosphatase: 82 U/L (ref 38–126)
BILIRUBIN TOTAL: 0.9 mg/dL (ref 0.3–1.2)
BUN: 32 mg/dL — ABNORMAL HIGH (ref 6–20)
CHLORIDE: 99 mmol/L — AB (ref 101–111)
CO2: 32 mmol/L (ref 22–32)
Calcium: 8.8 mg/dL — ABNORMAL LOW (ref 8.9–10.3)
Creatinine, Ser: 0.89 mg/dL (ref 0.61–1.24)
GFR calc Af Amer: >60 mL/min (ref >60)
GFR calc non Af Amer: >60 mL/min (ref >60)
GLUCOSE: 135 mg/dL — AB (ref 65–99)
POTASSIUM: 4.5 mmol/L (ref 3.5–5.1)
SODIUM: 140 mmol/L (ref 135–145)
TOTAL PROTEIN: 5.9 g/dL — AB (ref 6.5–8.1)

## 2017-09-05 LAB — GLUCOSE, CAPILLARY
GLUCOSE-CAPILLARY: 110 mg/dL — AB (ref 65–99)
GLUCOSE-CAPILLARY: 167 mg/dL — AB (ref 65–99)
Glucose-Capillary: 137 mg/dL — ABNORMAL HIGH (ref 65–99)
Glucose-Capillary: 249 mg/dL — ABNORMAL HIGH (ref 65–99)
Glucose-Capillary: 220 mg/dL — ABNORMAL HIGH (ref 65–99)
Glucose-Capillary: 107 mg/dL — ABNORMAL HIGH (ref 65–99)

## 2017-09-05 LAB — CBC
HCT: 48.7 % (ref 39.0–52.0)
Hemoglobin: 16.5 g/dL (ref 13.0–17.0)
MCH: 32.4 pg (ref 26.0–34.0)
MCHC: 33.9 g/dL (ref 30.0–36.0)
MCV: 95.7 fL (ref 78.0–100.0)
PLATELETS: 100 10*3/uL — AB (ref 150–400)
RBC: 5.09 MIL/uL (ref 4.22–5.81)
RDW: 14.8 % (ref 11.5–15.5)
WBC: 13 10*3/uL — ABNORMAL HIGH (ref 4.0–10.5)

## 2017-09-05 LAB — HEPARIN LEVEL (UNFRACTIONATED)
Heparin Unfractionated: 0.81 IU/mL — ABNORMAL HIGH (ref 0.30–0.70)
Heparin Unfractionated: 0.92 IU/mL — ABNORMAL HIGH (ref 0.30–0.70)
Heparin Unfractionated: 0.54 IU/mL (ref 0.30–0.70)

## 2017-09-05 LAB — COOXEMETRY PANEL
CARBOXYHEMOGLOBIN: 1.4 % (ref 0.5–1.5)
METHEMOGLOBIN: 0.8 % (ref 0.0–1.5)
O2 Saturation: 68.2 %
Total hemoglobin: 16.4 g/dL — ABNORMAL HIGH (ref 12.0–16.0)

## 2017-09-05 MED ORDER — SODIUM CHLORIDE 0.9% FLUSH: 3.0000 mL | Freq: Two times a day (BID) | INTRAVENOUS | Status: DC

## 2017-09-05 MED ORDER — SODIUM CHLORIDE 0.9% FLUSH: 3.0000 mL | INTRAVENOUS | Status: DC | PRN

## 2017-09-05 MED ORDER — ENSURE ENLIVE PO LIQD
237.0000 mL | Freq: Two times a day (BID) | ORAL | Status: DC
  Administered 2017-09-05 – 2017-09-06 (×2): 237 mL via ORAL

## 2017-09-05 MED ORDER — SODIUM CHLORIDE 0.9 % IV SOLN
INTRAVENOUS | Status: DC
  Administered 2017-09-06: 06:00:00 via INTRAVENOUS

## 2017-09-05 MED ORDER — PREDNISONE 20 MG PO TABS
20.0000 mg | ORAL_TABLET | Freq: Every day | ORAL | Status: DC
  Administered 2017-09-05 – 2017-09-08 (×4): 20 mg via ORAL
  Filled 2017-09-05 (×4): qty 1

## 2017-09-05 MED ORDER — ASPIRIN 81 MG PO CHEW
81.0000 mg | CHEWABLE_TABLET | ORAL | Status: AC
  Administered 2017-09-06: 81 mg via ORAL
  Filled 2017-09-05: qty 1

## 2017-09-05 MED ORDER — SODIUM CHLORIDE 0.9 % IV SOLN: 250.0000 mL | INTRAVENOUS | Status: DC | PRN

## 2017-09-05 MED ORDER — AZITHROMYCIN 500 MG PO TABS
500.0000 mg | ORAL_TABLET | Freq: Once | ORAL | Status: AC
  Administered 2017-09-06: 500 mg via ORAL
  Filled 2017-09-05: qty 1

## 2017-09-05 NOTE — Progress Notes (Signed)
Advanced Heart Failure Rounding Note  PCP:  Primary Cardiologist: New. Dr Haroldine Laws   Subjective:    Off the vent. Feels ok. No CP or SOB. PICC line exchanged yesterday. Diuresing briskly.  Co-ox 68%. Renal function stable  ECHO EF 15%.  Low gradient aortic stenosis.    Objective:   Weight Range: 76.4 kg (168 lb 6.9 oz) Body mass index is 23.49 kg/m.   Vital Signs:   Temp:  [97.6 F (36.4 C)-98.3 F (36.8 C)] 98.3 F (36.8 C) (10/30 0300) Pulse Rate:  [65-108] 72 (10/30 0300) Resp:  [11-23] 18 (10/30 0300) BP: (91-132)/(61-111) 112/75 (10/30 0300) SpO2:  [91 %-100 %] 96 % (10/30 0300) FiO2 (%):  [40 %] 40 % (10/29 0800) Last BM Date: 09/02/17  Weight change: Filed Weights   09/02/17 0220 09/03/17 0500 09/04/17 0300  Weight: 79.9 kg (176 lb 2.4 oz) 79.8 kg (175 lb 14.8 oz) 76.4 kg (168 lb 6.9 oz)    Intake/Output:   Intake/Output Summary (Last 24 hours) at 09/05/17 0523 Last data filed at 09/05/17 0000  Gross per 24 hour  Intake            666.7 ml  Output             3475 ml  Net          -2808.3 ml      Physical Exam    General:  Lying in bed  No resp difficulty HEENT: normal Neck: supple. JVP 5-6 Carotids 2+ bilat; no bruits. No lymphadenopathy or thryomegaly appreciated. Cor: PMI nondisplaced. Distant Regular rate & rhythm. I can barely hear AS murmur Lungs: clear but decreased throughout Abdomen: soft, nontender, nondistended. No hepatosplenomegaly. No bruits or masses. Good bowel sounds. Extremities: no cyanosis, clubbing, rash, edema Neuro: alert & orientedx3, cranial nerves grossly intact. moves all 4 extremities w/o difficulty. Affect pleasant   Telemetry   NSR 80-90s. Personally reviewed   EKG    Sinus tach 103 LBBB Personally reviewed Labs    CBC  Recent Labs  09/04/17 0511 09/05/17 0343  WBC 14.4* 13.0*  HGB 15.0 16.5  HCT 47.1 48.7  MCV 97.5 95.7  PLT 99* 854*   Basic Metabolic Panel  Recent Labs  09/03/17 0139  09/04/17 0511 09/05/17 0343  NA 139 141 140  K 3.8 4.3 4.5  CL 108 103 99*  CO2 22 30 32  GLUCOSE 217* 197* 135*  BUN 19 29* 32*  CREATININE 1.07 0.91 0.89  CALCIUM 8.1* 8.5* 8.8*  MG 2.2 2.1  --   PHOS 2.3* 3.1  --    Liver Function Tests  Recent Labs  09/03/17 0139 09/05/17 0343  AST 60* 42*  ALT 58 40  ALKPHOS 78 82  BILITOT 0.8 0.9  PROT 5.3* 5.9*  ALBUMIN 2.8* 3.1*   No results for input(s): LIPASE, AMYLASE in the last 72 hours. Cardiac Enzymes  Recent Labs  09/02/17 0928 09/02/17 1630 09/03/17 0139  TROPONINI 2.57* 2.03* 1.50*    BNP: BNP (last 3 results)  Recent Labs  09/01/17 2155 09/03/17 0139  BNP 885.5* 1,167.7*    ProBNP (last 3 results) No results for input(s): PROBNP in the last 8760 hours.   D-Dimer No results for input(s): DDIMER in the last 72 hours. Hemoglobin A1C  Recent Labs  09/04/17 0511  HGBA1C 6.2*   Fasting Lipid Panel No results for input(s): CHOL, HDL, LDLCALC, TRIG, CHOLHDL, LDLDIRECT in the last 72 hours. Thyroid Function Tests No results  for input(s): TSH, T4TOTAL, T3FREE, THYROIDAB in the last 72 hours.  Invalid input(s): FREET3  Other results:   Imaging    Dg Chest Port 1 View  Result Date: 09/04/2017 CLINICAL DATA:  Respiratory failure, COPD, current smoker, intubated patient. EXAM: PORTABLE CHEST 1 VIEW COMPARISON:  Portable chest x-ray of September 03, 2017 FINDINGS: The lungs are well-expanded. The interstitial markings remain increased. The cardiac silhouette remains enlarged and the pulmonary vascularity mildly engorged. The retrocardiac region on the left remains dense. There is a small left pleural effusion. Slightly increased density is noted medially at the right lung base. There is no pneumothorax. There is calcification in the wall of the aortic arch. The endotracheal tube tip lies 3.5 cm above the carina. The esophagogastric tube tip lies in the gastric cardia. The proximal port lies at the GE  junction. IMPRESSION: COPD with superimposed CHF. Left lower lobe atelectasis or pneumonia with possible early similar changes at the right base. Small left pleural effusion. Advancement of the esophagogastric tube by 5-10 cm would assure that the proximal port is indeed below the GE junction. Thoracic aortic atheroscleroses. Electronically Signed   By: David  Martinique M.D.   On: 09/04/2017 07:28   Ir Fluoro Guide Cv Midline Picc Left  Result Date: 09/04/2017 INDICATION: Malpositioned PICC line placed by the IV team. Request is made for repositioning. EXAM: FLUOROSCOPIC GUIDED PICC LINE EXCHANGE MEDICATIONS: None. CONTRAST:  None FLUOROSCOPY TIME:  2 minutes 24 seconds COMPLICATIONS: None immediate. TECHNIQUE: The procedure, risks, benefits, and alternatives were explained to the patient and informed written consent was obtained. The left upper extremity and external portion of the existing PICC line was prepped with chlorhexidine in a sterile fashion, and a sterile drape was applied covering the operative field. Maximum barrier sterile technique with sterile gowns and gloves were used for the procedure. A timeout was performed prior to the initiation of the procedure. The existing PICC line was cannulated with an 0.0018 wire which was advanced through the catheter. The catheter was exchanged for a peel-away sheath, ultimately allowing advancement of a 41 -cm, 5 - [place], triple lumen PICC line to the level of the superior caval atrial junction. A post procedure spot fluoroscopic image was obtained. The catheter easily aspirated and flushed and was sutured in place. A dressing was placed. The patient tolerated the procedure well without immediate post procedural complication. FINDINGS: After catheter exchange, the tip lies within the superior cavoatrial junction. The catheter aspirates and flushes normally and is ready for immediate use. IMPRESSION: Successful fluoroscopic guided exchange of left upper extremity  approach 41 cm, 5 - French, triple lumen PICC with tip overlying the superior caval atrial junction. The PICC line is ready for immediate use. Read by: Saverio Danker, PA-C Electronically Signed   By: Corrie Mckusick D.O.   On: 09/04/2017 14:35     Medications:     Scheduled Medications: . aspirin  81 mg Oral Daily  . atorvastatin  80 mg Oral q1800  . chlorhexidine gluconate (MEDLINE KIT)  15 mL Mouth Rinse BID  . Chlorhexidine Gluconate Cloth  6 each Topical Daily  . feeding supplement (PRO-STAT SUGAR FREE 64)  30 mL Per Tube BID  . Influenza vac split quadrivalent PF  0.5 mL Intramuscular Tomorrow-1000  . insulin aspart  2-6 Units Subcutaneous Q4H  . ipratropium-albuterol  3 mL Nebulization TID  . methylPREDNISolone (SOLU-MEDROL) injection  40 mg Intravenous Q12H  . pantoprazole (PROTONIX) IV  40 mg Intravenous Q24H  .  pneumococcal 23 valent vaccine  0.5 mL Intramuscular Tomorrow-1000  . sodium chloride flush  10-40 mL Intracatheter Q12H    Infusions: . sodium chloride    . azithromycin Stopped (09/04/17 1120)  . cefTRIAXone (ROCEPHIN)  IV Stopped (09/04/17 4696)  . heparin 1,200 Units/hr (09/05/17 0454)  . lactated ringers Stopped (09/03/17 2952)  . norepinephrine (LEVOPHED) Adult infusion Stopped (09/03/17 0600)    PRN Medications: sodium chloride, albuterol, clonazePAM, docusate, fentaNYL (SUBLIMAZE) injection, sodium chloride flush    Patient Profile  66 y/o with presumed COPD with ongoing tobacco, IBD s/p previous partial colectomy admitted with acute respiratory failure. Parainfluenza and PCT +. Echo with EF 15% and critical low-gradient AS   Assessment/Plan   1. Acute hypoxic respiratory failure - likely multifactorial COPD, acute parainfluenzae infection  and acute HF - Improved with diuresis. - Extubated 10/29  2. Acute systolic HF - Echo EF 84%. RV normal. Likely critical low gradient AS but may also have component of ischemic CM  - Given normal RV function  suspect severe LV dysfunction represents end-stage low-gradient AS +/- ischemic CM and not viral cardiomyopathy. - Volume status much improved. CVP 7 today - Given severe AS and HF will not add b-block or ACE/ARB currently - Do not overdiurese. Hold lasix today - Will need R/L cath tomorrow  - I have discussed with Dr. Burt Knack and TAVR team. Further plans based on coronary status - Continue to follow co-ox/cvp  3. Probable low-gradient critical aortic stenosis - As above  4. AKI - Creatinine improved 0.89 today  5. Elevated troponin - possible ischemia versus HF. No CP.  - continue ASA, statin. No b-blocker with acute HF & respiratory failure - R/L cath tomorrow  Length of Stay: 4   Glori Bickers, MD  09/05/2017, 5:23 AM  Advanced Heart Failure Team Pager [phone] (M-F; 7a - 4p)  Please contact [facility] for night-coverage after hours (4p -7a ) and weekends on [email or web address]

## 2017-09-05 NOTE — Progress Notes (Signed)
Received patient to room 2C13. Patient is orientated to room and call system and is hooked up to continuous monitoring.

## 2017-09-05 NOTE — Progress Notes (Signed)
ANTICOAGULATION CONSULT NOTE  Pharmacy Consult for Heparin Indication: chest pain/ACS  No Known Allergies  Patient Measurements: Height: 5' 11"  (180.3 cm) Weight: 160 lb (72.6 kg) IBW/kg (Calculated) : 75.3  Vital Signs: Temp: 98.3 F (36.8 C) (10/30 1900) Temp Source: Oral (10/30 1900) BP: 127/87 (10/30 2000) Pulse Rate: 93 (10/30 2000)  Labs:  Recent Labs  09/03/17 0139 09/04/17 0511 09/05/17 0343 09/05/17 1332 09/05/17 1949  HGB 15.1 15.0 16.5  --   --   HCT 46.1 47.1 48.7  --   --   PLT 96* 99* 100*  --   --   HEPARINUNFRC 0.27* 0.41 0.81* 0.92* 0.54  CREATININE 1.07 0.91 0.89  --   --   TROPONINI 1.50*  --   --   --   --     Estimated Creatinine Clearance: 83.8 mL/min (by C-G formula based on SCr of 0.89 mg/dL).  Assessment:  66 year old male with elevated cardiac markers, no chest pain, may be demand ischemia. Was started on a heparin drip.  Heparin level is therapeutic at 0.54 although it was drawn early (4.5 hr after rate change). No bleeding noted.   Goal of Therapy:  Heparin level 0.3-0.7 units/ml Monitor platelets by anticoagulation protocol: Yes   Plan:  Continue heparin drip at 1050 units/hr Confirmatory heparin level in am Daily heparin level and CBC Monitor for s/sx bleeding   Renold Genta, PharmD, BCPS Clinical Pharmacist Phone for today - [place] - [phone] 09/05/2017 9:13 PM

## 2017-09-05 NOTE — Progress Notes (Signed)
ANTICOAGULATION CONSULT NOTE Pharmacy Consult for Heparin Indication: chest pain/ACS  No Known Allergies  Patient Measurements: Height: 5' 11"  (180.3 cm) Weight: 160 lb (72.6 kg) IBW/kg (Calculated) : 75.3  Vital Signs: Temp: 97.9 F (36.6 C) (10/30 1100) Temp Source: Oral (10/30 1100) BP: 116/80 (10/30 1400) Pulse Rate: 91 (10/30 1430)  Labs:  Recent Labs  09/02/17 1630  09/03/17 0139 09/04/17 0511 09/05/17 0343 09/05/17 1332  HGB  --   < > 15.1 15.0 16.5  --   HCT  --   --  46.1 47.1 48.7  --   PLT  --   --  96* 99* 100*  --   HEPARINUNFRC  --   < > 0.27* 0.41 0.81* 0.92*  CREATININE  --   --  1.07 0.91 0.89  --   TROPONINI 2.03*  --  1.50*  --   --   --   < > = values in this interval not displayed.  Estimated Creatinine Clearance: 83.8 mL/min (by C-G formula based on SCr of 0.89 mg/dL).  Assessment: 66 year old male with elevated cardiac markers, no chest pain, may be demand ischemia. Heparin level is supratherapeutic again at 0.92 after decreasing the drip earlier today. Heparin is running peripherally and lab was drawn centrally.  No bleeding noted, CBC is stable, will decrease drip by ~2units/kg/hr.   Goal of Therapy:  Heparin level 0.3-0.7 units/ml Monitor platelets by anticoagulation protocol: Yes   Plan:  Decrease heparin to 1050 units/hr 6h heparin level Daily heparin level and cbc Monitor for s/sx bleeding   Patterson Hammersmith PharmD PGY1 Pharmacy Practice Resident 09/05/2017 3:03 PM Pager: [phone]

## 2017-09-05 NOTE — Progress Notes (Signed)
Nutrition Follow-up  INTERVENTION:   Ensure Enlive po BID, each supplement provides 350 kcal and 20 grams of protein  Discussed importance of adequate nutrition after d/c.    NUTRITION DIAGNOSIS:   Inadequate oral intake related to decreased appetite as evidenced by per patient/family report, meal completion < 50%. Ongoing.   GOAL:   Patient will meet greater than or equal to 90% of their needs Progressing.   MONITOR:   PO intake, Supplement acceptance  ASSESSMENT:   66 y/o male PMHx COPD, Colon surgery. Presented with 3 days of SOB, fever, cough, chills. Found to have acute hypercapnic respiratory failure and septic shock w/ evidence of PNA on CXR. Tired on Bipap and ultimately intubated. RD consulted for TF.   Per pt he thinks he has lost weight but unsure how much. He lives alone but mom (91) and daughter live next door. He does not cook much, he did recently get an air fryer that he will sometimes use. He eats out mostly. Breakfast: biscuits and gravy, Lunch: he usually skips, Dinner: meal out at [employer] (three trips to the [place] buffet at one visit). He also reports that his appetite has been decreased recently but does not know why and does not give specifics. He is willing to try ensure.   Meal Completion: 50% at breakfast  Medications reviewed and include: prednisone Labs reviewed   Diet Order:  DIET DYS 3 Room service appropriate? Yes; Fluid consistency: Thin  EDUCATION NEEDS:   No education needs have been identified at this time  Skin:  Skin Assessment: Reviewed RN Assessment  Last BM:  10/27 smear  Height:   Ht Readings from Last 1 Encounters:  09/01/17 5' 11"  (1.803 m)    Weight:   Wt Readings from Last 1 Encounters:  09/05/17 160 lb (72.6 kg)    Ideal Body Weight:  78.18 kg  BMI:  Body mass index is 22.32 kg/m.  Estimated Nutritional Needs:   Kcal:  1900-2100  Protein:  87-100 grams  Fluid:  > 1.9 L/day    Maylon Peppers RD,  LDN, CNSC [phone] Pager [phone] After Hours Pager

## 2017-09-05 NOTE — Progress Notes (Signed)
ANTICOAGULATION CONSULT NOTE Pharmacy Consult for Heparin Indication: chest pain/ACS  No Known Allergies  Patient Measurements: Height: 5' 11"  (180.3 cm) Weight: 168 lb 6.9 oz (76.4 kg) IBW/kg (Calculated) : 75.3  Vital Signs: Temp: 98.3 F (36.8 C) (10/30 0300) Temp Source: Oral (10/30 0300) BP: 112/75 (10/30 0300) Pulse Rate: 72 (10/30 0300)  Labs:  Recent Labs  09/02/17 0928 09/02/17 1630  09/03/17 0139 09/04/17 0511 09/05/17 0343  HGB  --   --   < > 15.1 15.0 16.5  HCT  --   --   --  46.1 47.1 48.7  PLT  --   --   --  96* 99* 100*  HEPARINUNFRC  --   --   < > 0.27* 0.41 0.81*  CREATININE  --   --   --  1.07 0.91  --   TROPONINI 2.57* 2.03*  --  1.50*  --   --   < > = values in this interval not displayed.  Estimated Creatinine Clearance: 85 mL/min (by C-G formula based on SCr of 0.91 mg/dL).  Assessment: 67 year old male with elevated cardiac markers, no chest pain, may be demand ischemia. Heparin level now supratherapeutic. CBC is stable and no bleeding or IV line issues per RN..  Goal of Therapy:  Heparin level 0.3-0.7 units/ml Monitor platelets by anticoagulation protocol: Yes   Plan:  Decrease heparin to 1200 units/hr 6h heparin level Daily heparin level and cbc Monitor for s/sx bleeding   Elicia Lamp, PharmD, BCPS Clinical Pharmacist 09/05/2017 4:33 AM

## 2017-09-05 NOTE — Plan of Care (Signed)
Problem: Physical Regulation: Goal: Signs and symptoms of infection will decrease Outcome: Progressing Has remained afebrile, WBC trending down

## 2017-09-05 NOTE — Plan of Care (Signed)
Problem: Activity: Goal: Ability to tolerate increased activity will improve Outcome: Progressing Pt ambulated 370 ft this AM. Tolerated well. No SHOB

## 2017-09-05 NOTE — Progress Notes (Signed)
PULMONARY / CRITICAL CARE MEDICINE   Name: Kenneth Mills MRN: [id number] DOB: 03-20-1951    ADMISSION DATE:  09/01/2017 CONSULTATION DATE:  09/01/17  REFERRING MD:  Dr. Lita Mains  CHIEF COMPLAINT:  SOB  BRIEF SUMMARY:   65 yo male smoker admitted with dyspnea x 2 weeks.  Found to have viral pneumonia, systolic CHF and severe aortic stenosis. PMHx of IBS, COPD  SUBJECTIVE:   Denies chest pain, cough, or shortness of breath.  VITAL SIGNS: BP 102/83   Pulse 94   Temp 98.3 F (36.8 C) (Oral)   Resp 20   Ht 5' 11"  (1.803 m)   Wt 160 lb (72.6 kg)   SpO2 95%   BMI 22.32 kg/m   HEMODYNAMICS: CVP:  [4 mmHg-7 mmHg] 4 mmHg  VENTILATOR SETTINGS: Vent Mode: PSV;CPAP FiO2 (%):  [40 %] 40 % PEEP:  [5 cmH20] 5 cmH20 Pressure Support:  [5 cmH20] 5 cmH20  INTAKE / OUTPUT: I/O last 3 completed shifts: In: 4696 [I.V.:515; NG/GT:660; IV Piggyback:300] Out: 2952 [Urine:4875]  PHYSICAL EXAMINATION:  General - pleasant Eyes - pupils reactive ENT - no sinus tenderness, no oral exudate, no LAN Cardiac - regular, 2/6 murmur Chest - no wheeze, rales Abd - soft, non tender Ext - no edema Skin - no rashes Neuro - normal strength Psych - normal mood  LABS:  BMET  Recent Labs Lab 09/03/17 0139 09/04/17 0511 09/05/17 0343  NA 139 141 140  K 3.8 4.3 4.5  CL 108 103 99*  CO2 22 30 32  BUN 19 29* 32*  CREATININE 1.07 0.91 0.89  GLUCOSE 217* 197* 135*    Electrolytes  Recent Labs Lab 09/02/17 1630 09/03/17 0139 09/04/17 0511 09/05/17 0343  CALCIUM  --  8.1* 8.5* 8.8*  MG 2.2 2.2 2.1  --   PHOS 2.2* 2.3* 3.1  --     CBC  Recent Labs Lab 09/03/17 0139 09/04/17 0511 09/05/17 0343  WBC 15.9* 14.4* 13.0*  HGB 15.1 15.0 16.5  HCT 46.1 47.1 48.7  PLT 96* 99* 100*    Coag's  Recent Labs Lab 09/02/17 0259  APTT 35  INR 1.15    Sepsis Markers  Recent Labs Lab 09/01/17 2247 09/02/17 0259 09/02/17 0602  LATICACIDVEN 3.17* 1.9 2.2*  PROCALCITON   --  4.53  --     ABG  Recent Labs Lab 09/01/17 2243 09/02/17 0033 09/02/17 0408  PHART 7.176* 7.212* 7.322*  PCO2ART 50.2* 42.6 36.1  PO2ART 100.0 74.0* 77.0*    Liver Enzymes  Recent Labs Lab 09/02/17 0259 09/03/17 0139 09/05/17 0343  AST 217* 60* 42*  ALT 110* 58 40  ALKPHOS 117 78 82  BILITOT 1.5* 0.8 0.9  ALBUMIN 3.2* 2.8* 3.1*    Cardiac Enzymes  Recent Labs Lab 09/02/17 0928 09/02/17 1630 09/03/17 0139  TROPONINI 2.57* 2.03* 1.50*    Glucose  Recent Labs Lab 09/04/17 0754 09/04/17 1136 09/04/17 1614 09/04/17 1943 09/04/17 2327 09/05/17 0319  GLUCAP 190* 160* 106* 168* 136* 137*    Imaging Ir Fluoro Guide Cv Midline Picc Left  Result Date: 09/04/2017 INDICATION: Malpositioned PICC line placed by the IV team. Request is made for repositioning. EXAM: FLUOROSCOPIC GUIDED PICC LINE EXCHANGE MEDICATIONS: None. CONTRAST:  None FLUOROSCOPY TIME:  2 minutes 24 seconds COMPLICATIONS: None immediate. TECHNIQUE: The procedure, risks, benefits, and alternatives were explained to the patient and informed written consent was obtained. The left upper extremity and external portion of the existing PICC line was prepped with chlorhexidine  in a sterile fashion, and a sterile drape was applied covering the operative field. Maximum barrier sterile technique with sterile gowns and gloves were used for the procedure. A timeout was performed prior to the initiation of the procedure. The existing PICC line was cannulated with an 0.0018 wire which was advanced through the catheter. The catheter was exchanged for a peel-away sheath, ultimately allowing advancement of a 41 -cm, 5 - [place], triple lumen PICC line to the level of the superior caval atrial junction. A post procedure spot fluoroscopic image was obtained. The catheter easily aspirated and flushed and was sutured in place. A dressing was placed. The patient tolerated the procedure well without immediate post procedural  complication. FINDINGS: After catheter exchange, the tip lies within the superior cavoatrial junction. The catheter aspirates and flushes normally and is ready for immediate use. IMPRESSION: Successful fluoroscopic guided exchange of left upper extremity approach 41 cm, 5 - French, triple lumen PICC with tip overlying the superior caval atrial junction. The PICC line is ready for immediate use. Read by: Saverio Danker, PA-C Electronically Signed   By: Corrie Mckusick D.O.   On: 09/04/2017 14:35     STUDIES:  ECHO 10/27 >> severe LV dilation, LVEF 15%, diffuse hypokinesis, doppler parameters consistent with restrictive physiology, indicative of decreased L ventricular diastolic compliance or increased L atrial pressure, AV severely restricted, LA moderately dilated  CULTURES: RVP 10/26 >> parainfluenza positive BCx2 10/26 >>  Sputum 10/26 >> oral flora Urine 10/26 >> No Growth  ANTIBIOTICS: Vanco 10/26 >> 10/28 Zosyn 10/26 >> 10/28 Rocephin 10/28 >>  Azithro 10/28 >>   SIGNIFICANT EVENTS: 10/26  Admit to [facility] with increasing SOB, intubated  10/29 Extubated  LINES/TUBES: ETT 10/26 >> 10/29 Lt PICC 10/29 >>   ASSESSMENT / PLAN:  Acute hypoxic respiratory failure 2nd to viral pneumonia with parainfluenza with bacterial superinfection and acute pulmonary edema. Hx of COPD with tobacco abuse. - oxygen to keep SpO2 > 92% - d/c solumedrol and change to prednisone 20 mg >> wean off as able - scheduled BDs - f/u CXR intermittently - day 5/7 Abx  Acute systolic CHF. Severe aortic stenosis. HLD. - cardiology planning Lt and Rt heart cath  Acute renal failure 2nd to ATN. Lactic acidosis. - resolved  Thrombocytopenia. - f/u CBC  Anxiety. - prn klonopin  Steroid induced hyperglycemia. - SSI  DVT prophylaxis - heparin gtt SUP - not indicated Nutrition - D3 diet Goals of care - full code  Updated pt's family at bedside  Transfer to SDU 10/30 >> To [facility] 10/31 and PCCM  off  Chesley Mires, MD [place] 09/05/2017, 7:40 AM Pager:  [phone] After 3pm call: [phone]

## 2017-09-05 NOTE — Care Management Note (Signed)
Case Management Note  Patient Details  Name: ALIAS VILLAGRAN MRN: [id number] Date of Birth: [birth year]  Subjective/Objective:  From home alone, pta indep, presents with Acute hypoxic resp failure, Acute Sys HF, aortic stenosis, AKI, Elevated trop.  Patient is off vent, he states he goes to [place] and he has medication coverage, his daughter , Barnetta Chapel lives right next to him and his son lives in [place] also and he checks on him all the time.                    Action/Plan: NCM will follow for dc needs.   Expected Discharge Date:                  Expected Discharge Plan:  Home/Self Care  In-House Referral:     Discharge planning Services  CM Consult  Post Acute Care Choice:    Choice offered to:     DME Arranged:    DME Agency:     HH Arranged:    HH Agency:     Status of Service:  In process, will continue to follow  If discussed at Long Length of Stay Meetings, dates discussed:    Additional Comments:  Zenon Mayo, RN 09/05/2017, 12:24 PM

## 2017-09-06 ENCOUNTER — Encounter (HOSPITAL_COMMUNITY): Payer: Self-pay | Admitting: Internal Medicine

## 2017-09-06 ENCOUNTER — Encounter (HOSPITAL_COMMUNITY)
Admission: EM | Disposition: A | Discharge status: Home Health | Payer: Self-pay | Source: Home / Self Care | Attending: Internal Medicine

## 2017-09-06 HISTORY — PX: RIGHT/LEFT HEART CATH AND CORONARY ANGIOGRAPHY: CATH118266

## 2017-09-06 LAB — BASIC METABOLIC PANEL
ANION GAP: 9 (ref 5–15)
BUN: 34 mg/dL — AB (ref 6–20)
CHLORIDE: 102 mmol/L (ref 101–111)
CO2: 30 mmol/L (ref 22–32)
Calcium: 8.9 mg/dL (ref 8.9–10.3)
Creatinine, Ser: 0.94 mg/dL (ref 0.61–1.24)
GFR calc Af Amer: >60 mL/min (ref >60)
GFR calc non Af Amer: >60 mL/min (ref >60)
GLUCOSE: 114 mg/dL — AB (ref 65–99)
POTASSIUM: 4.1 mmol/L (ref 3.5–5.1)
SODIUM: 141 mmol/L (ref 135–145)

## 2017-09-06 LAB — POCT I-STAT 3, VENOUS BLOOD GAS (G3P V)
Acid-Base Excess: 5 mmol/L — ABNORMAL HIGH (ref 0.0–2.0)
Acid-Base Excess: 7 mmol/L — ABNORMAL HIGH (ref 0.0–2.0)
BICARBONATE: 32.5 mmol/L — AB (ref 20.0–28.0)
Bicarbonate: 30.9 mmol/L — ABNORMAL HIGH (ref 20.0–28.0)
O2 Saturation: 66 %
O2 Saturation: 69 %
PCO2 VEN: 48.1 mmHg (ref 44.0–60.0)
PCO2 VEN: 48.7 mmHg (ref 44.0–60.0)
PH VEN: 7.433 — AB (ref 7.250–7.430)
TCO2: 34 mmol/L — AB (ref 22–32)
TCO2: 32 mmol/L (ref 22–32)
pH, Ven: 7.415 (ref 7.250–7.430)
pO2, Ven: 34 mmHg (ref 32.0–45.0)
pO2, Ven: 36 mmHg (ref 32.0–45.0)

## 2017-09-06 LAB — CBC
HCT: 51.3 % (ref 39.0–52.0)
HEMOGLOBIN: 17.1 g/dL — AB (ref 13.0–17.0)
MCH: 31.7 pg (ref 26.0–34.0)
MCHC: 33.3 g/dL (ref 30.0–36.0)
MCV: 95.2 fL (ref 78.0–100.0)
Platelets: 103 10*3/uL — ABNORMAL LOW (ref 150–400)
RBC: 5.39 MIL/uL (ref 4.22–5.81)
RDW: 14.3 % (ref 11.5–15.5)
WBC: 12.7 10*3/uL — ABNORMAL HIGH (ref 4.0–10.5)

## 2017-09-06 LAB — POCT I-STAT 3, ART BLOOD GAS (G3+)
Acid-Base Excess: 5 mmol/L — ABNORMAL HIGH (ref 0.0–2.0)
BICARBONATE: 29.6 mmol/L — AB (ref 20.0–28.0)
O2 Saturation: 97 %
PH ART: 7.466 — AB (ref 7.350–7.450)
PO2 ART: 82 mmHg — AB (ref 83.0–108.0)
TCO2: 31 mmol/L (ref 22–32)
pCO2 arterial: 41.1 mmHg (ref 32.0–48.0)

## 2017-09-06 LAB — GLUCOSE, CAPILLARY
GLUCOSE-CAPILLARY: 123 mg/dL — AB (ref 65–99)
GLUCOSE-CAPILLARY: 123 mg/dL — AB (ref 65–99)
GLUCOSE-CAPILLARY: 88 mg/dL (ref 65–99)
Glucose-Capillary: 101 mg/dL — ABNORMAL HIGH (ref 65–99)
Glucose-Capillary: 200 mg/dL — ABNORMAL HIGH (ref 65–99)
Glucose-Capillary: 212 mg/dL — ABNORMAL HIGH (ref 65–99)

## 2017-09-06 LAB — HEPARIN LEVEL (UNFRACTIONATED): Heparin Unfractionated: 0.51 IU/mL (ref 0.30–0.70)

## 2017-09-06 SURGERY — RIGHT/LEFT HEART CATH AND CORONARY ANGIOGRAPHY
Anesthesia: LOCAL

## 2017-09-06 MED ORDER — LIDOCAINE HCL 2 % IJ SOLN
INTRAMUSCULAR | Status: AC
  Filled 2017-09-06: qty 20

## 2017-09-06 MED ORDER — FENTANYL CITRATE (PF) 100 MCG/2ML IJ SOLN
INTRAMUSCULAR | Status: DC | PRN
  Administered 2017-09-06: 25 ug via INTRAVENOUS

## 2017-09-06 MED ORDER — HEPARIN (PORCINE) IN NACL 2-0.9 UNIT/ML-% IJ SOLN
INTRAMUSCULAR | Status: DC | PRN
  Administered 2017-09-06: 10 mL via INTRA_ARTERIAL

## 2017-09-06 MED ORDER — IOPAMIDOL (ISOVUE-370) INJECTION 76%
INTRAVENOUS | Status: AC
  Filled 2017-09-06: qty 100

## 2017-09-06 MED ORDER — SODIUM CHLORIDE 0.9 % IV SOLN
INTRAVENOUS | Status: AC
  Administered 2017-09-06: 10:00:00 via INTRAVENOUS

## 2017-09-06 MED ORDER — NITROGLYCERIN 1 MG/10 ML FOR IR/CATH LAB
INTRA_ARTERIAL | Status: DC | PRN
  Administered 2017-09-06: 100 ug

## 2017-09-06 MED ORDER — HEPARIN SODIUM (PORCINE) 1000 UNIT/ML IJ SOLN
INTRAMUSCULAR | Status: AC
  Filled 2017-09-06: qty 1

## 2017-09-06 MED ORDER — MIDAZOLAM HCL 2 MG/2ML IJ SOLN
INTRAMUSCULAR | Status: DC | PRN
  Administered 2017-09-06: 1 mg via INTRAVENOUS

## 2017-09-06 MED ORDER — MIDAZOLAM HCL 2 MG/2ML IJ SOLN
INTRAMUSCULAR | Status: AC
  Filled 2017-09-06: qty 2

## 2017-09-06 MED ORDER — HEPARIN (PORCINE) IN NACL 2-0.9 UNIT/ML-% IJ SOLN
INTRAMUSCULAR | Status: AC
  Filled 2017-09-06: qty 1000

## 2017-09-06 MED ORDER — NITROGLYCERIN 1 MG/10 ML FOR IR/CATH LAB
INTRA_ARTERIAL | Status: AC
  Filled 2017-09-06: qty 10

## 2017-09-06 MED ORDER — ACETAMINOPHEN 325 MG PO TABS: 650.0000 mg | ORAL_TABLET | ORAL | Status: DC | PRN

## 2017-09-06 MED ORDER — SODIUM CHLORIDE 0.9% FLUSH
3.0000 mL | Freq: Two times a day (BID) | INTRAVENOUS | Status: DC
  Administered 2017-09-06 – 2017-09-08 (×4): 3 mL via INTRAVENOUS

## 2017-09-06 MED ORDER — SODIUM CHLORIDE 0.9 % IV SOLN: 250.0000 mL | INTRAVENOUS | Status: DC | PRN

## 2017-09-06 MED ORDER — ONDANSETRON HCL 4 MG/2ML IJ SOLN: 4.0000 mg | Freq: Four times a day (QID) | INTRAMUSCULAR | Status: DC | PRN

## 2017-09-06 MED ORDER — ENOXAPARIN SODIUM 40 MG/0.4ML ~~LOC~~ SOLN
40.0000 mg | SUBCUTANEOUS | Status: DC
  Administered 2017-09-07 – 2017-09-08 (×2): 40 mg via SUBCUTANEOUS
  Filled 2017-09-06 (×2): qty 0.4

## 2017-09-06 MED ORDER — HEPARIN (PORCINE) IN NACL 2-0.9 UNIT/ML-% IJ SOLN
INTRAMUSCULAR | Status: AC | PRN
  Administered 2017-09-06: 1000 mL

## 2017-09-06 MED ORDER — LIDOCAINE HCL 2 % IJ SOLN
INTRAMUSCULAR | Status: DC | PRN
  Administered 2017-09-06: 5 mL

## 2017-09-06 MED ORDER — VERAPAMIL HCL 2.5 MG/ML IV SOLN
INTRAVENOUS | Status: AC
  Filled 2017-09-06: qty 2

## 2017-09-06 MED ORDER — FENTANYL CITRATE (PF) 100 MCG/2ML IJ SOLN
INTRAMUSCULAR | Status: AC
  Filled 2017-09-06: qty 2

## 2017-09-06 MED ORDER — IOPAMIDOL (ISOVUE-370) INJECTION 76%
INTRAVENOUS | Status: DC | PRN
  Administered 2017-09-06: 75 mL via INTRA_ARTERIAL

## 2017-09-06 MED ORDER — SODIUM CHLORIDE 0.9% FLUSH: 3.0000 mL | INTRAVENOUS | Status: DC | PRN

## 2017-09-06 MED ORDER — HEPARIN SODIUM (PORCINE) 1000 UNIT/ML IJ SOLN
INTRAMUSCULAR | Status: DC | PRN
  Administered 2017-09-06: 3500 [IU] via INTRAVENOUS

## 2017-09-06 SURGICAL SUPPLY — 14 items
CATH BALLN WEDGE 5F 110CM (CATHETERS) ×2 IMPLANT
CATH INFINITI 5 FR JL3.5 (CATHETERS) ×2 IMPLANT
CATH INFINITI JR4 5F (CATHETERS) ×2 IMPLANT
DEVICE RAD COMP TR BAND LRG (VASCULAR PRODUCTS) ×2 IMPLANT
GLIDESHEATH SLEND SS 6F .021 (SHEATH) ×2 IMPLANT
GUIDEWIRE INQWIRE 1.5J.035X260 (WIRE) ×1 IMPLANT
INQWIRE 1.5J .035X260CM (WIRE) ×2
KIT HEART LEFT (KITS) ×2 IMPLANT
PACK CARDIAC CATHETERIZATION (CUSTOM PROCEDURE TRAY) ×2 IMPLANT
SHEATH GLIDE SLENDER 4/5FR (SHEATH) ×2 IMPLANT
TRANSDUCER W/STOPCOCK (MISCELLANEOUS) ×2 IMPLANT
TUBING CIL FLEX 10 FLL-RA (TUBING) ×2 IMPLANT
WIRE EMERALD ST .035X260CM (WIRE) ×2 IMPLANT
WIRE HI TORQ WHISPER MS 190CM (WIRE) ×2 IMPLANT

## 2017-09-06 NOTE — Progress Notes (Signed)
Paged on call Kenneth Mills to inquire if patient should be in droplett isolation d/t positive for parainfluenza pna. Will continue to monitor.

## 2017-09-06 NOTE — Progress Notes (Signed)
PROGRESS NOTE  Kenneth Mills:[id number] DOB: 10-Feb-1951 DOA: 09/01/2017 PCP: Patient, No Pcp Per  HPI/Recap of past 42 hours: 66 year old male with a past medical history of COPD, IBS, presented with shortness of breath for 2 weeks.  Patient was diagnosed with acute hypoxic respiratory failure secondary to viral pneumonia (para-influenza) and also found to have acute systolic CHF likely secondary to severe aortic stenosis.  Patient was initially intubated on 10/26 and extubated on 10/29.   Today, patient reports feeling better overall.  Denies worsening shortness of breath, denies chest pain, dizziness, abdominal pain, nausea/vomiting, fever/chills.  Patient had cath today and has been doing well so far.  Assessment/Plan: Active Problems:   Respiratory failure (HCC)  #Acute hypoxic respiratory failure Improving Likely secondary to viral pneumonia with parainfluenza and bacterial superinfection Intubated on 10/26 and extubated on 10/29 History of COPD, wants to quit tobacco Keep sats greater than 92%, as needed oxygen Continue IV ceftriaxone, for a total of 7 days Continue prednisone 20 mg daily Duo nebs Pulmonology following  #Acute systolic CHF Likely secondary to severe aortic stenosis BNP 1167 Echo showed- severe LV dysfunction in the setting of probable low-grade aortic stenosis and end-stage AS cardiomyopathy. Right and left heart cath done 10/31 showed mild nonobstructive CAD, 50% proximal RCA with spasm, severe aortic stenosis, severe nonischemic cardiomyopathy. Cardiology on board recommend pt be evaluated by TAVR team for workup for possible TAVR after recovery from this admission Await TAVR team recs  #Elevated troponin Trending downwards Likely due to severe CHF No need to trend  #AK I Resolved Likely secondary to ATN  #Thrombocytopenia Unknown etiology Daily CBC  #Prediabetes A1c 6.2 Monitor sugars as patient is on steroid SSI Lifestyle  modification discussed  #Anxiety Continue clonazepam   Code Status: Full  Family Communication: None at bedside  Disposition Plan: Home once stable   Consultants:  Pulmonology  Cardiology  Procedures:  Intubated on 10/26 and extubated on 10/29  Right and left heart cath on 10/31  Antimicrobials:  IV ceftriaxone  DVT prophylaxis: Heparin, switching to Lovenox   Objective: Vitals:   09/06/17 1101 09/06/17 1119 09/06/17 1134 09/06/17 1153  BP: 114/73 115/79 128/70 120/71  Pulse: 85 64 84 78  Resp: _0 Temp:      TempSrc:      SpO2: 95% 93% 94% 96%  Weight:      Height:        Intake/Output Summary (Last 24 hours) at 09/06/17 1215 Last data filed at 09/06/17 0653  Gross per 24 hour  Intake            298.4 ml  Output             1325 ml  Net          -1026.6 ml   Filed Weights   09/05/17 0500 09/05/17 2130 09/06/17 0416  Weight: 72.6 kg (160 lb) 72.9 kg (160 lb 11.5 oz) 71.7 kg (158 lb)    Exam:   General: Awake alert oriented x3  Cardiovascular: S1-S2 present no added heart sounds  Respiratory: Chest clear bilaterally with reduced breath sounds  Abdomen: Soft nontender nondistended, bowel sounds positive  Musculoskeletal: No pedal edema  Skin: Normal  Psychiatry: Normal mood   Data Reviewed: CBC:  Recent Labs Lab 09/01/17 2155 09/02/17 0259 09/03/17 0139 09/04/17 0511 09/05/17 0343 09/06/17 0435  WBC 9.9 13.4* 15.9* 14.4* 13.0* 12.7*  NEUTROABS 4.8 12.3*  --   --   --   --  HGB 17.3* 17.3* 15.1 15.0 16.5 17.1*  HCT 53.5* 52.9* 46.1 47.1 48.7 51.3  MCV 97.1 96.5 94.9 97.5 95.7 95.2  PLT 129* 113* 96* 99* 100* 122*   Basic Metabolic Panel:  Recent Labs Lab 09/02/17 0259 09/02/17 0928 09/02/17 1630 09/03/17 0139 09/04/17 0511 09/05/17 0343 09/06/17 0435  NA 139  --   --  139 141 140 141  K 4.6  --   --  3.8 4.3 4.5 4.1  CL 112*  --   --  108 103 99* 102  CO2 16*  --   --  22 30 32 30  GLUCOSE 181*  --    --  217* 197* 135* 114*  BUN 12  --   --  19 29* 32* 34*  CREATININE 1.31*  --   --  1.07 0.91 0.89 0.94  CALCIUM 7.3*  --   --  8.1* 8.5* 8.8* 8.9  MG 1.6* 2.4 2.2 2.2 2.1  --   --   PHOS 2.7 3.0 2.2* 2.3* 3.1  --   --    GFR: Estimated Creatinine Clearance: 78.4 mL/min (by C-G formula based on SCr of 0.94 mg/dL). Liver Function Tests:  Recent Labs Lab 09/01/17 2155 09/02/17 0259 09/03/17 0139 09/05/17 0343  AST 128* 217* 60* 42*  ALT 68* 110* 58 40  ALKPHOS 132* 117 78 82  BILITOT 0.9 1.5* 0.8 0.9  PROT 7.3 5.8* 5.3* 5.9*  ALBUMIN 4.1 3.2* 2.8* 3.1*   No results for input(s): LIPASE, AMYLASE in the last 168 hours. No results for input(s): AMMONIA in the last 168 hours. Coagulation Profile:  Recent Labs Lab 09/02/17 0259  INR 1.15   Cardiac Enzymes:  Recent Labs Lab 09/01/17 2155 09/02/17 0259 09/02/17 0928 09/02/17 1630 09/03/17 0139  TROPONINI 0.44* 1.41* 2.57* 2.03* 1.50*   BNP (last 3 results) No results for input(s): PROBNP in the last 8760 hours. HbA1C:  Recent Labs  09/04/17 0511  HGBA1C 6.2*   CBG:  Recent Labs Lab 09/05/17 1921 09/05/17 2324 09/06/17 0412 09/06/17 0739 09/06/17 1200  GLUCAP 220* 107* 123* 101* 200*   Lipid Profile: No results for input(s): CHOL, HDL, LDLCALC, TRIG, CHOLHDL, LDLDIRECT in the last 72 hours. Thyroid Function Tests: No results for input(s): TSH, T4TOTAL, FREET4, T3FREE, THYROIDAB in the last 72 hours. Anemia Panel: No results for input(s): VITAMINB12, FOLATE, FERRITIN, TIBC, IRON, RETICCTPCT in the last 72 hours. Urine analysis:    Component Value Date/Time   COLORURINE YELLOW 09/01/2017 2350   APPEARANCEUR HAZY (A) 09/01/2017 2350   LABSPEC 1.020 09/01/2017 2350   PHURINE 6.0 09/01/2017 2350   GLUCOSEU 100 (A) 09/01/2017 2350   HGBUR MODERATE (A) 09/01/2017 2350   BILIRUBINUR NEGATIVE 09/01/2017 2350   KETONESUR NEGATIVE 09/01/2017 2350   PROTEINUR 100 (A) 09/01/2017 2350   NITRITE NEGATIVE  09/01/2017 2350   LEUKOCYTESUR NEGATIVE 09/01/2017 2350   Sepsis Labs: _0 (procalcitonin:4,lacticidven:4)  ) Recent Results (from the past 240 hour(s))  Culture, blood (Routine X 2) w Reflex to ID Panel     Status: None (Preliminary result)   Collection Time: 09/01/17 10:00 PM  Result Value Ref Range Status   Specimen Description BLOOD RIGHT ANTECUBITAL  Final   Special Requests   Final    BOTTLES DRAWN AEROBIC AND ANAEROBIC Blood Culture results may not be optimal due to an inadequate volume of blood received in culture bottles   Culture   Final    NO GROWTH 3 DAYS Performed at [facility]  Lab, [address], [place], [place] [address]    Report Status PENDING  Incomplete  Culture, blood (Routine X 2) w Reflex to ID Panel     Status: None (Preliminary result)   Collection Time: 09/01/17 10:00 PM  Result Value Ref Range Status   Specimen Description BLOOD LEFT ANTECUBITAL  Final   Special Requests   Final    BOTTLES DRAWN AEROBIC AND ANAEROBIC Blood Culture results may not be optimal due to an inadequate volume of blood received in culture bottles   Culture   Final    NO GROWTH 3 DAYS Performed at [facility], [place] [address], [place], [place] [address]    Report Status PENDING  Incomplete  Urine culture     Status: None   Collection Time: 09/01/17 11:50 PM  Result Value Ref Range Status   Specimen Description URINE, CATHETERIZED  Final   Special Requests NONE  Final   Culture   Final    NO GROWTH Performed at [facility], [address], [place], [place] [address]    Report Status 09/03/2017 FINAL  Final  Respiratory Panel by PCR     Status: Abnormal   Collection Time: 09/02/17  2:25 AM  Result Value Ref Range Status   Adenovirus NOT DETECTED NOT DETECTED Final   Coronavirus 229E NOT DETECTED NOT DETECTED Final   Coronavirus HKU1 NOT DETECTED NOT DETECTED Final   Coronavirus NL63 NOT DETECTED NOT DETECTED Final   Coronavirus OC43 NOT DETECTED  NOT DETECTED Final   Metapneumovirus NOT DETECTED NOT DETECTED Final   Rhinovirus / Enterovirus NOT DETECTED NOT DETECTED Final   Influenza A NOT DETECTED NOT DETECTED Final   Influenza B NOT DETECTED NOT DETECTED Final   Parainfluenza Virus 1 NOT DETECTED NOT DETECTED Final   Parainfluenza Virus 2 DETECTED (A) NOT DETECTED Final   Parainfluenza Virus 3 NOT DETECTED NOT DETECTED Final   Parainfluenza Virus 4 NOT DETECTED NOT DETECTED Final   Respiratory Syncytial Virus NOT DETECTED NOT DETECTED Final   Bordetella pertussis NOT DETECTED NOT DETECTED Final   Chlamydophila pneumoniae NOT DETECTED NOT DETECTED Final   Mycoplasma pneumoniae NOT DETECTED NOT DETECTED Final  MRSA PCR Screening     Status: None   Collection Time: 09/02/17  2:25 AM  Result Value Ref Range Status   MRSA by PCR NEGATIVE NEGATIVE Final    Comment:        The GeneXpert MRSA Assay (FDA approved for NASAL specimens only), is one component of a comprehensive MRSA colonization surveillance program. It is not intended to diagnose MRSA infection nor to guide or monitor treatment for MRSA infections.   Culture, respiratory (tracheal aspirate)     Status: None   Collection Time: 09/02/17  4:03 AM  Result Value Ref Range Status   Specimen Description TRACHEAL ASPIRATE  Final   Special Requests NONE  Final   Gram Stain   Final    MODERATE WBC PRESENT, PREDOMINANTLY PMN FEW GRAM NEGATIVE RODS FEW GRAM POSITIVE COCCI IN PAIRS    Culture Consistent with normal respiratory flora.  Final   Report Status 09/04/2017 FINAL  Final      Studies: No results found.  Scheduled Meds: . aspirin  81 mg Oral Daily  . atorvastatin  80 mg Oral q1800  . chlorhexidine gluconate (MEDLINE KIT)  15 mL Mouth Rinse BID  . Chlorhexidine Gluconate Cloth  6 each Topical Daily  . [START ON 09/07/2017] enoxaparin (LOVENOX) injection  40 mg Subcutaneous Q24H  . feeding supplement (ENSURE ENLIVE)  237 mL Oral BID BM  . insulin aspart   2-6 Units Subcutaneous Q4H  . ipratropium-albuterol  3 mL Nebulization TID  . predniSONE  20 mg Oral Q breakfast  . sodium chloride flush  10-40 mL Intracatheter Q12H  . sodium chloride flush  3 mL Intravenous Q12H    Continuous Infusions: . sodium chloride    . sodium chloride 75 mL/hr at 09/06/17 1024  . sodium chloride    . cefTRIAXone (ROCEPHIN)  IV Stopped (09/06/17 1046)  . lactated ringers 10 mL/hr at 09/06/17 0532     LOS: 5 days     Alma Friendly, MD [facility] Pager [phone]-  If 7PM-7AM, please contact night-coverage [email or web address] Password [email or web address] 09/06/2017, 12:15 PM

## 2017-09-06 NOTE — Progress Notes (Signed)
  Speech Language Pathology Treatment: Dysphagia  Patient Details Name: Kenneth Mills MRN: [id number] DOB: 29-Dec-1950 Today's Date: 09/06/2017 Time: 1015-1027 SLP Time Calculation (min) (ACUTE ONLY): 12 min  Assessment / Plan / Recommendation Clinical Impression  Initial visit on 10/29 following 3 day intubation started on D3, thin. Seen today for potential advancement to regular texture. Oral manipulation and transit swift without residual. Thin liquids consumed via straw- no s/s aspiration and independent throughout session. Upgrade to regular (heart healthy), continue regular. No further ST needed.    HPI HPI: 66 y/o M, smoker (50 years, up to 2ppd, currently 0.5-1ppd) who was admitted 10/26 with a two week history progressive shortness of breath. His son reports that he does not go to the doctor often and only uses a "nerve pill". [facility] is [facility]. He failed BiPAP and required intubation. The initial working diagnosis was HCAP +/- CHF. ECHO assessment revealed an LVEF of 15% and severe AS. Intubated from 10/26 to 10/29. Pt also found to have parainfluenza.       SLP Plan  All goals met;Discharge SLP treatment due to (comment)       Recommendations  Diet recommendations: Regular;Thin liquid Liquids provided via: Cup;Straw Medication Administration: Whole meds with liquid Supervision: Patient able to self feed Postural Changes and/or Swallow Maneuvers: Seated upright 90 degrees                Oral Care Recommendations: Oral care BID Follow up Recommendations: None SLP Visit Diagnosis: Dysphagia, unspecified (R13.10) Plan: All goals met;Discharge SLP treatment due to (comment)                       Houston Siren 09/06/2017, 12:13 PM  Orbie Pyo Colvin Caroli.[employer] [phone]

## 2017-09-06 NOTE — H&P (View-Only) (Signed)
Advanced Heart Failure Rounding Note  PCP:  Primary Cardiologist: New. Dr Haroldine Laws   Subjective:    Feels ok. Anxious about cath. No CP or SOB. BP stable   ECHO EF 15%.  Low gradient aortic stenosis.    Objective:   Weight Range: 71.7 kg (158 lb) Body mass index is 22.67 kg/m.   Vital Signs:   Temp:  [97.6 F (36.4 C)-98.3 F (36.8 C)] 98.1 F (36.7 C) (10/31 0727) Pulse Rate:  [67-129] 80 (10/31 0727) Resp:  [13-26] 18 (10/31 0727) BP: (101-127)/(69-92) 113/81 (10/31 0727) SpO2:  [86 %-98 %] 94 % (10/31 0832) Weight:  [71.7 kg (158 lb)-72.9 kg (160 lb 11.5 oz)] 71.7 kg (158 lb) (10/31 0416) Last BM Date: 09/02/17  Weight change: Filed Weights   09/05/17 0500 09/05/17 2130 09/06/17 0416  Weight: 72.6 kg (160 lb) 72.9 kg (160 lb 11.5 oz) 71.7 kg (158 lb)    Intake/Output:   Intake/Output Summary (Last 24 hours) at 09/06/17 0836 Last data filed at 09/06/17 0653  Gross per 24 hour  Intake            816.4 ml  Output             1725 ml  Net           -908.6 ml      Physical Exam    General:  Chronically ill appearing No resp difficulty HEENT: normal Neck: supple. no JVD. Carotids 2+ bilat; no bruits. No lymphadenopathy or thryomegaly appreciated. Cor: PMI nondisplaced. Distant heart sounds Regular rate & rhythm. Can barely hear AS Lungs: clear markedly decreased throughout Abdomen: soft, nontender, nondistended. No hepatosplenomegaly. No bruits or masses. Good bowel sounds. Extremities: no cyanosis, clubbing, rash, edema Neuro: alert & orientedx3, cranial nerves grossly intact. moves all 4 extremities w/o difficulty. Affect pleasant but anxious     Telemetry   NSR 80s. Personally reviewed   EKG    Sinus tach 103 LBBB Personally reviewed Labs    CBC  Recent Labs  09/05/17 0343 09/06/17 0435  WBC 13.0* 12.7*  HGB 16.5 17.1*  HCT 48.7 51.3  MCV 95.7 95.2  PLT 100* 154*   Basic Metabolic Panel  Recent Labs  09/04/17 0511  09/05/17 0343 09/06/17 0435  NA 141 140 141  K 4.3 4.5 4.1  CL 103 99* 102  CO2 30 32 30  GLUCOSE 197* 135* 114*  BUN 29* 32* 34*  CREATININE 0.91 0.89 0.94  CALCIUM 8.5* 8.8* 8.9  MG 2.1  --   --   PHOS 3.1  --   --    Liver Function Tests  Recent Labs  09/05/17 0343  AST 42*  ALT 40  ALKPHOS 82  BILITOT 0.9  PROT 5.9*  ALBUMIN 3.1*   No results for input(s): LIPASE, AMYLASE in the last 72 hours. Cardiac Enzymes No results for input(s): CKTOTAL, CKMB, CKMBINDEX, TROPONINI in the last 72 hours.  BNP: BNP (last 3 results)  Recent Labs  09/01/17 2155 09/03/17 0139  BNP 885.5* 1,167.7*    ProBNP (last 3 results) No results for input(s): PROBNP in the last 8760 hours.   D-Dimer No results for input(s): DDIMER in the last 72 hours. Hemoglobin A1C  Recent Labs  09/04/17 0511  HGBA1C 6.2*   Fasting Lipid Panel No results for input(s): CHOL, HDL, LDLCALC, TRIG, CHOLHDL, LDLDIRECT in the last 72 hours. Thyroid Function Tests No results for input(s): TSH, T4TOTAL, T3FREE, THYROIDAB in the last 72 hours.  Invalid input(s): FREET3  Other results:   Imaging    No results found.   Medications:     Scheduled Medications: . [MAR Hold] aspirin  81 mg Oral Daily  . [MAR Hold] atorvastatin  80 mg Oral q1800  . [MAR Hold] azithromycin  500 mg Oral Once  . [MAR Hold] chlorhexidine gluconate (MEDLINE KIT)  15 mL Mouth Rinse BID  . [MAR Hold] Chlorhexidine Gluconate Cloth  6 each Topical Daily  . [MAR Hold] feeding supplement (ENSURE ENLIVE)  237 mL Oral BID BM  . [MAR Hold] insulin aspart  2-6 Units Subcutaneous Q4H  . [MAR Hold] ipratropium-albuterol  3 mL Nebulization TID  . [MAR Hold] predniSONE  20 mg Oral Q breakfast  . [MAR Hold] sodium chloride flush  10-40 mL Intracatheter Q12H  . sodium chloride flush  3 mL Intravenous Q12H    Infusions: . [MAR Hold] sodium chloride    . sodium chloride    . sodium chloride 10 mL/hr at 09/06/17 0604  . [MAR  Hold] cefTRIAXone (ROCEPHIN)  IV Stopped (09/05/17 1109)  . heparin Stopped (09/06/17 0740)  . lactated ringers 10 mL/hr at 09/06/17 0532    PRN Medications: [MAR Hold] sodium chloride, sodium chloride, [MAR Hold] albuterol, [MAR Hold] clonazePAM, [MAR Hold] sodium chloride flush, sodium chloride flush    Patient Profile  66 y/o with presumed COPD with ongoing tobacco, IBD s/p previous partial colectomy admitted with acute respiratory failure. Parainfluenza and PCT +. Echo with EF 15% and critical low-gradient AS   Assessment/Plan   1. Acute hypoxic respiratory failure - likely multifactorial COPD, acute parainfluenzae infection  and acute HF - Improved with diuresis. - Extubated 10/29  2. Acute systolic HF - Echo EF 32%. RV normal. Likely critical low gradient AS but may also have component of ischemic CM  - Given normal RV function suspect severe LV dysfunction represents end-stage low-gradient AS +/- ischemic CM and not viral cardiomyopathy. - Volume status ok Renal function stable. No co-ox today  - Given severe AS and HF will not add b-block or ACE/ARB currently - Do not overdiurese.  - Plan R/L cath tomorrow will cross AoV to measure gradient invasivel - I have discussed with Dr. Burt Knack and TAVR team. Further plans based on coronary status   3. Probable low-gradient critical aortic stenosis - As above  4. AKI - Creatinine improved 0.94 today  5. Elevated troponin - possible ischemia versus HF. No CP.  - continue ASA, statin. No b-blocker with acute HF & respiratory failure - R/L cath this am   Length of Stay: 5   Glori Bickers, MD  09/06/2017, 8:36 AM  Advanced Heart Failure Team Pager [phone] (M-F; 7a - 4p)  Please contact [facility] for night-coverage after hours (4p -7a ) and weekends on [email or web address]

## 2017-09-06 NOTE — Progress Notes (Signed)
Received call from Santiago Glad from cath lab that pt was being sent for, heparin was stopped.

## 2017-09-06 NOTE — Progress Notes (Signed)
  Advanced Heart Failure Rounding Note  PCP:  Primary Cardiologist: New. Dr Bensimhon   Subjective:    Feels ok. Anxious about cath. No CP or SOB. BP stable   ECHO EF 15%.  Low gradient aortic stenosis.    Objective:   Weight Range: 71.7 kg (158 lb) Body mass index is 22.67 kg/m.   Vital Signs:   Temp:  [97.6 F (36.4 C)-98.3 F (36.8 C)] 98.1 F (36.7 C) (10/31 0727) Pulse Rate:  [67-129] 80 (10/31 0727) Resp:  [13-26] 18 (10/31 0727) BP: (101-127)/(69-92) 113/81 (10/31 0727) SpO2:  [86 %-98 %] 94 % (10/31 0832) Weight:  [71.7 kg (158 lb)-72.9 kg (160 lb 11.5 oz)] 71.7 kg (158 lb) (10/31 0416) Last BM Date: 09/02/17  Weight change: Filed Weights   09/05/17 0500 09/05/17 2130 09/06/17 0416  Weight: 72.6 kg (160 lb) 72.9 kg (160 lb 11.5 oz) 71.7 kg (158 lb)    Intake/Output:   Intake/Output Summary (Last 24 hours) at 09/06/17 0836 Last data filed at 09/06/17 0653  Gross per 24 hour  Intake            816.4 ml  Output             1725 ml  Net           -908.6 ml      Physical Exam    General:  Chronically ill appearing No resp difficulty HEENT: normal Neck: supple. no JVD. Carotids 2+ bilat; no bruits. No lymphadenopathy or thryomegaly appreciated. Cor: PMI nondisplaced. Distant heart sounds Regular rate & rhythm. Can barely hear AS Lungs: clear markedly decreased throughout Abdomen: soft, nontender, nondistended. No hepatosplenomegaly. No bruits or masses. Good bowel sounds. Extremities: no cyanosis, clubbing, rash, edema Neuro: alert & orientedx3, cranial nerves grossly intact. moves all 4 extremities w/o difficulty. Affect pleasant but anxious     Telemetry   NSR 80s. Personally reviewed   EKG    Sinus tach 103 LBBB Personally reviewed Labs    CBC  Recent Labs  09/05/17 0343 09/06/17 0435  WBC 13.0* 12.7*  HGB 16.5 17.1*  HCT 48.7 51.3  MCV 95.7 95.2  PLT 100* 103*   Basic Metabolic Panel  Recent Labs  09/04/17 0511  09/05/17 0343 09/06/17 0435  NA 141 140 141  K 4.3 4.5 4.1  CL 103 99* 102  CO2 30 32 30  GLUCOSE 197* 135* 114*  BUN 29* 32* 34*  CREATININE 0.91 0.89 0.94  CALCIUM 8.5* 8.8* 8.9  MG 2.1  --   --   PHOS 3.1  --   --    Liver Function Tests  Recent Labs  09/05/17 0343  AST 42*  ALT 40  ALKPHOS 82  BILITOT 0.9  PROT 5.9*  ALBUMIN 3.1*   No results for input(s): LIPASE, AMYLASE in the last 72 hours. Cardiac Enzymes No results for input(s): CKTOTAL, CKMB, CKMBINDEX, TROPONINI in the last 72 hours.  BNP: BNP (last 3 results)  Recent Labs  09/01/17 2155 09/03/17 0139  BNP 885.5* 1,167.7*    ProBNP (last 3 results) No results for input(s): PROBNP in the last 8760 hours.   D-Dimer No results for input(s): DDIMER in the last 72 hours. Hemoglobin A1C  Recent Labs  09/04/17 0511  HGBA1C 6.2*   Fasting Lipid Panel No results for input(s): CHOL, HDL, LDLCALC, TRIG, CHOLHDL, LDLDIRECT in the last 72 hours. Thyroid Function Tests No results for input(s): TSH, T4TOTAL, T3FREE, THYROIDAB in the last 72 hours.    Invalid input(s): FREET3  Other results:   Imaging    No results found.   Medications:     Scheduled Medications: . [MAR Hold] aspirin  81 mg Oral Daily  . [MAR Hold] atorvastatin  80 mg Oral q1800  . [MAR Hold] azithromycin  500 mg Oral Once  . [MAR Hold] chlorhexidine gluconate (MEDLINE KIT)  15 mL Mouth Rinse BID  . [MAR Hold] Chlorhexidine Gluconate Cloth  6 each Topical Daily  . [MAR Hold] feeding supplement (ENSURE ENLIVE)  237 mL Oral BID BM  . [MAR Hold] insulin aspart  2-6 Units Subcutaneous Q4H  . [MAR Hold] ipratropium-albuterol  3 mL Nebulization TID  . [MAR Hold] predniSONE  20 mg Oral Q breakfast  . [MAR Hold] sodium chloride flush  10-40 mL Intracatheter Q12H  . sodium chloride flush  3 mL Intravenous Q12H    Infusions: . [MAR Hold] sodium chloride    . sodium chloride    . sodium chloride 10 mL/hr at 09/06/17 0604  . [MAR  Hold] cefTRIAXone (ROCEPHIN)  IV Stopped (09/05/17 1109)  . heparin Stopped (09/06/17 0740)  . lactated ringers 10 mL/hr at 09/06/17 0532    PRN Medications: [MAR Hold] sodium chloride, sodium chloride, [MAR Hold] albuterol, [MAR Hold] clonazePAM, [MAR Hold] sodium chloride flush, sodium chloride flush    Patient Profile  66 y/o with presumed COPD with ongoing tobacco, IBD s/p previous partial colectomy admitted with acute respiratory failure. Parainfluenza and PCT +. Echo with EF 15% and critical low-gradient AS   Assessment/Plan   1. Acute hypoxic respiratory failure - likely multifactorial COPD, acute parainfluenzae infection  and acute HF - Improved with diuresis. - Extubated 10/29  2. Acute systolic HF - Echo EF 15%. RV normal. Likely critical low gradient AS but may also have component of ischemic CM  - Given normal RV function suspect severe LV dysfunction represents end-stage low-gradient AS +/- ischemic CM and not viral cardiomyopathy. - Volume status ok Renal function stable. No co-ox today  - Given severe AS and HF will not add b-block or ACE/ARB currently - Do not overdiurese.  - Plan R/L cath tomorrow will cross AoV to measure gradient invasivel - I have discussed with Dr. Cooper and TAVR team. Further plans based on coronary status   3. Probable low-gradient critical aortic stenosis - As above  4. AKI - Creatinine improved 0.94 today  5. Elevated troponin - possible ischemia versus HF. No CP.  - continue ASA, statin. No b-blocker with acute HF & respiratory failure - R/L cath this am   Length of Stay: 5   Bensimhon, Daniel, MD  09/06/2017, 8:36 AM  Advanced Heart Failure Team Pager [phone] (M-F; 7a - 4p)  Please contact [facility] for night-coverage after hours (4p -7a ) and weekends on [email or web address]    

## 2017-09-06 NOTE — Interval H&P Note (Signed)
History and Physical Interval Note:  09/06/2017 8:39 AM  Kenneth Mills  has presented today for surgery, with the diagnosis of hf  The various methods of treatment have been discussed with the patient and family. After consideration of risks, benefits and other options for treatment, the patient has consented to  Procedure(s): RIGHT/LEFT HEART CATH AND CORONARY ANGIOGRAPHY (N/A) and possible angioplasty as a surgical intervention .  The patient's history has been reviewed, patient examined, no change in status, stable for surgery.  I have reviewed the patient's chart and labs.  Questions were answered to the patient's satisfaction.     Trebor Galdamez, Quillian Quince

## 2017-09-07 ENCOUNTER — Encounter (HOSPITAL_COMMUNITY): Payer: Self-pay | Admitting: Physician Assistant

## 2017-09-07 ENCOUNTER — Inpatient Hospital Stay (HOSPITAL_COMMUNITY): Payer: Medicare HMO

## 2017-09-07 ENCOUNTER — Other Ambulatory Visit: Payer: Self-pay | Admitting: Physician Assistant

## 2017-09-07 DIAGNOSIS — I350 Nonrheumatic aortic (valve) stenosis: Secondary | ICD-10-CM

## 2017-09-07 DIAGNOSIS — R911 Solitary pulmonary nodule: Secondary | ICD-10-CM

## 2017-09-07 DIAGNOSIS — A419 Sepsis, unspecified organism: Principal | ICD-10-CM

## 2017-09-07 DIAGNOSIS — R6520 Severe sepsis without septic shock: Secondary | ICD-10-CM

## 2017-09-07 DIAGNOSIS — Z720 Tobacco use: Secondary | ICD-10-CM | POA: Diagnosis present

## 2017-09-07 DIAGNOSIS — J440 Chronic obstructive pulmonary disease with acute lower respiratory infection: Secondary | ICD-10-CM

## 2017-09-07 DIAGNOSIS — I447 Left bundle-branch block, unspecified: Secondary | ICD-10-CM | POA: Diagnosis present

## 2017-09-07 DIAGNOSIS — K529 Noninfective gastroenteritis and colitis, unspecified: Secondary | ICD-10-CM | POA: Diagnosis present

## 2017-09-07 DIAGNOSIS — I5021 Acute systolic (congestive) heart failure: Secondary | ICD-10-CM

## 2017-09-07 DIAGNOSIS — I428 Other cardiomyopathies: Secondary | ICD-10-CM

## 2017-09-07 DIAGNOSIS — F419 Anxiety disorder, unspecified: Secondary | ICD-10-CM | POA: Diagnosis present

## 2017-09-07 DIAGNOSIS — J449 Chronic obstructive pulmonary disease, unspecified: Secondary | ICD-10-CM | POA: Diagnosis present

## 2017-09-07 HISTORY — DX: Solitary pulmonary nodule: R91.1

## 2017-09-07 LAB — CBC WITH DIFFERENTIAL/PLATELET
BASOS ABS: 0 10*3/uL (ref 0.0–0.1)
BASOS PCT: 0 %
Eosinophils Absolute: 0 10*3/uL (ref 0.0–0.7)
Eosinophils Relative: 0 %
HEMATOCRIT: 49.9 % (ref 39.0–52.0)
HEMOGLOBIN: 17.1 g/dL — AB (ref 13.0–17.0)
Lymphocytes Relative: 27 %
Lymphs Abs: 2.5 10*3/uL (ref 0.7–4.0)
MCH: 32.2 pg (ref 26.0–34.0)
MCHC: 34.3 g/dL (ref 30.0–36.0)
MCV: 94 fL (ref 78.0–100.0)
MONOS PCT: 8 %
Monocytes Absolute: 0.7 10*3/uL (ref 0.1–1.0)
NEUTROS ABS: 5.8 10*3/uL (ref 1.7–7.7)
NEUTROS PCT: 64 %
Platelets: 107 10*3/uL — ABNORMAL LOW (ref 150–400)
RBC: 5.31 MIL/uL (ref 4.22–5.81)
RDW: 14 % (ref 11.5–15.5)
WBC: 9 10*3/uL (ref 4.0–10.5)

## 2017-09-07 LAB — GLUCOSE, CAPILLARY
GLUCOSE-CAPILLARY: 195 mg/dL — AB (ref 65–99)
GLUCOSE-CAPILLARY: 107 mg/dL — AB (ref 65–99)
GLUCOSE-CAPILLARY: 130 mg/dL — AB (ref 65–99)
Glucose-Capillary: 91 mg/dL (ref 65–99)
Glucose-Capillary: 89 mg/dL (ref 65–99)

## 2017-09-07 LAB — VAS US CAROTID
RIGHT VERTEBRAL DIAS: 7 cm/s
Right CCA prox dias: 14 cm/s
Right CCA prox sys: 51 cm/s

## 2017-09-07 LAB — BASIC METABOLIC PANEL
ANION GAP: 7 (ref 5–15)
BUN: 34 mg/dL — ABNORMAL HIGH (ref 6–20)
CHLORIDE: 104 mmol/L (ref 101–111)
CO2: 29 mmol/L (ref 22–32)
Calcium: 8.7 mg/dL — ABNORMAL LOW (ref 8.9–10.3)
Creatinine, Ser: 0.8 mg/dL (ref 0.61–1.24)
GFR calc non Af Amer: >60 mL/min (ref >60)
Glucose, Bld: 95 mg/dL (ref 65–99)
Potassium: 3.7 mmol/L (ref 3.5–5.1)
Sodium: 140 mmol/L (ref 135–145)

## 2017-09-07 LAB — CULTURE, BLOOD (ROUTINE X 2)
CULTURE: NO GROWTH
Culture: NO GROWTH

## 2017-09-07 LAB — LIPID PANEL
Cholesterol: 145 mg/dL (ref 0–200)
HDL: 51 mg/dL (ref >40)
LDL Cholesterol: 76 mg/dL (ref 0–99)
TRIGLYCERIDES: 90 mg/dL (ref <150)
Total CHOL/HDL Ratio: 2.8 RATIO
VLDL: 18 mg/dL (ref 0–40)

## 2017-09-07 MED ORDER — METOPROLOL TARTRATE 25 MG PO TABS
25.0000 mg | ORAL_TABLET | Freq: Once | ORAL | Status: AC
  Administered 2017-09-07: 25 mg via ORAL
  Filled 2017-09-07: qty 1

## 2017-09-07 MED ORDER — SPIRONOLACTONE 25 MG PO TABS
12.5000 mg | ORAL_TABLET | Freq: Every day | ORAL | Status: DC
  Administered 2017-09-07 – 2017-09-08 (×2): 12.5 mg via ORAL
  Filled 2017-09-07 (×2): qty 1

## 2017-09-07 MED ORDER — DEXTROSE 5 % IV SOLN
1.0000 g | INTRAVENOUS | Status: DC
  Administered 2017-09-08: 1 g via INTRAVENOUS
  Filled 2017-09-07: qty 10

## 2017-09-07 MED ORDER — IPRATROPIUM-ALBUTEROL 0.5-2.5 (3) MG/3ML IN SOLN: 3.0000 mL | RESPIRATORY_TRACT | Status: DC | PRN

## 2017-09-07 MED ORDER — METOPROLOL TARTRATE 5 MG/5ML IV SOLN
INTRAVENOUS | Status: AC
  Administered 2017-09-07: 5 mg
  Filled 2017-09-07: qty 5

## 2017-09-07 MED ORDER — IOPAMIDOL (ISOVUE-370) INJECTION 76%
INTRAVENOUS | Status: AC
  Administered 2017-09-07: 80 mL
  Filled 2017-09-07: qty 100

## 2017-09-07 MED ORDER — METOPROLOL TARTRATE 5 MG/5ML IV SOLN
INTRAVENOUS | Status: AC
  Administered 2017-09-07: 5 mg via INTRAVENOUS
  Filled 2017-09-07: qty 5

## 2017-09-07 MED ORDER — DIGOXIN 125 MCG PO TABS
0.1250 mg | ORAL_TABLET | Freq: Every day | ORAL | Status: DC
  Administered 2017-09-07 – 2017-09-08 (×2): 0.125 mg via ORAL
  Filled 2017-09-07 (×2): qty 1

## 2017-09-07 MED ORDER — METOPROLOL TARTRATE 5 MG/5ML IV SOLN
5.0000 mg | Freq: Once | INTRAVENOUS | Status: AC
  Administered 2017-09-07: 5 mg via INTRAVENOUS

## 2017-09-07 NOTE — Consult Note (Signed)
[facility] VALVE TEAM  Inpatient TAVR Consultation:   Patient ID: Kenneth Mills; [id number]; Apr 06, 1951   Admit date: 09/01/2017 Date of Consult: 09/07/2017  Primary Care Provider: Patient, No Pcp Per Primary Cardiologist: Dr Haroldine Laws    Patient Profile:   Kenneth Mills is a 66 y.o. male with a hx of presumed COPD with a history of heavy tobacco abuse, IBD s/p partiral colectomy (2009), anxiety and no other past medical history who is being seen today for the evaluation of low flow, low gradient severe aortic stenosis at the request of Dr. Haroldine Laws.  History of Present Illness:   Kenneth Mills lives in [place], [place]. He has 2 children that live very close. He worked for an [employer] company and retired in 2009.  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 He has noted some mild dyspnea on exertion over the past 5-6 years with moderate activity, but nothing dramatic. He smoked for the past 50 years up to 2 PPD but more recently about 1 PPD. He rarely uses alcohol and denies illicit drug use. He does not regularly see a medical provider but sporadically goes to the [facility] in [place]. The only medications he takes are aspirin and PRN clonazepam.  He does regularly see a dentist and has a cavity filling scheduled for early January.   He was in his usual state of health until about 2 weeks prior to admission when he noticed progressive shortness of breath. He also admitted to cough and fever. He presented to an OSH on 09/01/17 where he was found to have acute respiratory failure with diffuse infiltrates on CXR. He failed BIPAP and required intubation. He was transferred to [facility] for further evaluation and work up.  Hospital work up showed ECG with sinus tach and LBBB. Titers + for parainfluenzae. PCT elevated to 4.53. Lactic acid 2.2. Troponin 0.44--> 1.41-->  2.57--> 2.03--> 1.50. BNP 885--> 1167. WBC 15.9. He was started on abx, steroids and IV lasix.   2D ECHO on 09/02/17 showed EF 15% with diffuse HK, normal RV and possible critical low-gradient AS. Dr. Haroldine Laws with advanced CHF service was consulted. He underwent L/RHC on 09/06/17 which showed mild non obst CAD, severe NICM with EF 10-15%, moderately reduced CO and likely low gradient severe AS with a peak gradient 12mHG and mean gradient 24 mmHG. Dr. BHaroldine Lawsfelt severe LV dysfunction likely 2/2 end stage low flow/ low gradient severe AS and the multidisciplinary valve team was consulted for possible TAVR.   He is currently feeling much better. He denies CP and SOB much improved. He says he has quit smoking. No LE edema, orthopnea or PND. No dizziness or syncope. No blood in stool or urine. No palpitations. He is eager to go home.     Past Medical History:  Diagnosis Date  . Anxiety   . Aortic stenosis, severe    a. suspect low flow, low gradient severe AS.   .Marland KitchenCOPD (chronic obstructive pulmonary disease) ([facility]   . IBD (inflammatory bowel disease)    a. s/p partial colectomy in 2009  . LBBB (left bundle branch block)   . NICM (nonischemic cardiomyopathy) ([facility]    a. 08/2014: EF 15% suspect to be endstage CM 2/2 severe AS  . Tobacco abuse     Past Surgical History:  Procedure Laterality Date  . COLON SURGERY    .  HERNIA REPAIR    . IR FLUORO GUIDE CV MIDLINE PICC LEFT  09/04/2017  . RIGHT/LEFT HEART CATH AND CORONARY ANGIOGRAPHY N/A 09/06/2017   Procedure: RIGHT/LEFT HEART CATH AND CORONARY ANGIOGRAPHY;  Surgeon: Jolaine Artist, MD;  Location: [facility];  Service: Cardiovascular;  Laterality: N/A;     Inpatient Medications: Scheduled Meds: . aspirin  81 mg Oral Daily  . atorvastatin  80 mg Oral q1800  . chlorhexidine gluconate (MEDLINE KIT)  15 mL Mouth Rinse BID  . Chlorhexidine Gluconate Cloth  6 each Topical Daily  . digoxin  0.125 mg Oral Daily  .  enoxaparin (LOVENOX) injection  40 mg Subcutaneous Q24H  . feeding supplement (ENSURE ENLIVE)  237 mL Oral BID BM  . insulin aspart  2-6 Units Subcutaneous Q4H  . predniSONE  20 mg Oral Q breakfast  . sodium chloride flush  10-40 mL Intracatheter Q12H  . sodium chloride flush  3 mL Intravenous Q12H  . spironolactone  12.5 mg Oral Daily   Continuous Infusions: . sodium chloride    . sodium chloride    . lactated ringers 10 mL/hr at 09/06/17 0532   PRN Meds: sodium chloride, sodium chloride, acetaminophen, clonazePAM, ipratropium-albuterol, ondansetron (ZOFRAN) IV, sodium chloride flush, sodium chloride flush  Allergies:   No Known Allergies  Social History:   Social History   Social History  . Marital status: Married    Spouse name: N/A  . Number of children: N/A  . Years of education: N/A   Occupational History  . Not on file.   Social History Main Topics  . Smoking status: Current Every Day Smoker    Types: Cigarettes  . Smokeless tobacco: Never Used  . Alcohol use Yes     Comment: rarely  . Drug use: No  . Sexual activity: Not on file   Other Topics Concern  . Not on file   Social History Narrative  . No narrative on file    Family History:   The patient's family history includes Heart attack in his father; Hypertension in his father; Lung cancer in his brother.  ROS:  Please see the history of present illness.  ROS  All other ROS reviewed and negative.     Physical Exam/Data:   Vitals:   09/07/17 0425 09/07/17 0849 09/07/17 0908 09/07/17 1039  BP:  121/71    Pulse:    93  Resp:      Temp:  97.7 F (36.5 C)    TempSrc:  Oral    SpO2:   94%   Weight: 156 lb 8.4 oz (71 kg)     Height:        Intake/Output Summary (Last 24 hours) at 09/07/17 1116 Last data filed at 09/07/17 1019  Gross per 24 hour  Intake              360 ml  Output              402 ml  Net              -42 ml   Filed Weights   09/05/17 2130 09/06/17 0416 09/07/17 0425    Weight: 160 lb 11.5 oz (72.9 kg) 158 lb (71.7 kg) 156 lb 8.4 oz (71 kg)   Body mass index is 22.46 kg/m.  General:  Well nourished, well developed, in no acute distress, poor appearing dentition HEENT: normal Lymph: no adenopathy Neck: no JVD Endocrine:  No thryomegaly Vascular: No carotid bruits;  FA pulses 2+ bilaterally without bruits  Cardiac:  normal S1, S2; RRR; distant heart sounds with soft murmur  Lungs:  clear to auscultation bilaterally, no wheezing, rhonchi or rales  Abd: soft, nontender, no hepatomegaly  Ext: no edema Musculoskeletal:  No deformities, BUE and BLE strength normal and equal Skin: warm and dry  Neuro:  CNs 2-12 intact, no focal abnormalities noted Psych:  Normal affect   EKG:  The EKG was personally reviewed and demonstrates:  Sinus tach with LBBB Telemetry:  Telemetry was personally reviewed and demonstrates: sinus   Relevant CV Studies: 2D ECHO 09/02/17 Study Conclusions - Left ventricle: The cavity size was severely dilated. The estimated ejection fraction was 15%. Diffuse hypokinesis. Doppler parameters are consistent with restrictive physiology, indicative of decreased left ventricular diastolic compliance and/or increased left atrial pressure. - Aortic valve: Valve mobility was severely restricted. Dimensional index is severely reduced. Suspect low-gradient severe aortic stenosis. Valve area (VTI): 0.66 cm^2. Valve area (Vmax): 0.67 cm^2. Valve area (Vmean): 0.62 cm^2. - Left atrium: The atrium was moderately dilated. - Impressions: There is severe LV dysfunction in the setting of probable low-grade aortic stenosis and end-stage AS cardiomyopathy. Impressions: - There is severe LV dysfunction in the setting of probable low-grade aortic stenosis and end-stage AS cardiomyopathy.   09/06/17 RHC/LHC   Prox RCA lesion, 50 %stenosed.  Mid RCA lesion, 30 %stenosed.  Mid LAD lesion, 30 %stenosed.  Mid Cx to Dist Cx  lesion, 20 %stenosed.  Findings:  Ao = 96/67 (81) LV 140/19 RA = 4 RV = 31/7 PA = 28/13 (21) PCW = 14 Fick cardiac output/index =3.7/1.9 PVR = 1.9 WU Ao sat = 97% PA sat = 69%, 65%  Aortic valve Peak gradient 76mHG Mean gradient 24 mmHG ECHO EF 15%. Low gradient aortic stenosis.   Laboratory Data:  Chemistry Recent Labs Lab 09/05/17 0343 09/06/17 0435 09/07/17 0335  NA 140 141 140  K 4.5 4.1 3.7  CL 99* 102 104  CO2 32 30 29  GLUCOSE 135* 114* 95  BUN 32* 34* 34*  CREATININE 0.89 0.94 0.80  CALCIUM 8.8* 8.9 8.7*  GFRNONAA >60 >60 >60  GFRAA >60 >60 >60  ANIONGAP _0 Recent Labs Lab 09/02/17 0259 09/03/17 0139 09/05/17 0343  PROT 5.8* 5.3* 5.9*  ALBUMIN 3.2* 2.8* 3.1*  AST 217* 60* 42*  ALT 110* 58 40  ALKPHOS 117 78 82  BILITOT 1.5* 0.8 0.9   Hematology Recent Labs Lab 09/05/17 0343 09/06/17 0435 09/07/17 0335  WBC 13.0* 12.7* 9.0  RBC 5.09 5.39 5.31  HGB 16.5 17.1* 17.1*  HCT 48.7 51.3 49.9  MCV 95.7 95.2 94.0  MCH 32.4 31.7 32.2  MCHC 33.9 33.3 34.3  RDW 14.8 14.3 14.0  PLT 100* 103* 107*   Cardiac Enzymes Recent Labs Lab 09/01/17 2155 09/02/17 0259 09/02/17 0928 09/02/17 1630 09/03/17 0139  TROPONINI 0.44* 1.41* 2.57* 2.03* 1.50*   No results for input(s): TROPIPOC in the last 168 hours.  BNP Recent Labs Lab 09/01/17 2155 09/03/17 0139  BNP 885.5* 1,167.7*    DDimer No results for input(s): DDIMER in the last 168 hours.  Radiology/Studies:  Dg Chest Port 1 View  Result Date: 09/05/2017 CLINICAL DATA:  Shortness of Breath EXAM: PORTABLE CHEST 1 VIEW COMPARISON:  September 04, 2017 FINDINGS: Endotracheal tube and nasogastric tube have been removed. Central catheter tip is currently in the superior vena cava near the cavoatrial junction. No pneumothorax. There is interstitial prominence throughout  the lungs, likely a degree of interstitial edema. There is no frank airspace consolidation currently. Heart is enlarged  with mild pulmonary venous hypertension. There is aortic atherosclerosis. No bone lesions. IMPRESSION: Central catheter tip in superior vena cava near the cavoatrial junction. No pneumothorax. Interstitial edema present without alveolar consolidation. Stable underlying pulmonary vascular congestion. There is aortic atherosclerosis. Aortic Atherosclerosis (ICD10-I70.0). Electronically Signed   By: Lowella Grip III M.D.   On: 09/05/2017 08:53   Dg Chest Port 1 View  Result Date: 09/04/2017 CLINICAL DATA:  Respiratory failure, COPD, current smoker, intubated patient. EXAM: PORTABLE CHEST 1 VIEW COMPARISON:  Portable chest x-ray of September 03, 2017 FINDINGS: The lungs are well-expanded. The interstitial markings remain increased. The cardiac silhouette remains enlarged and the pulmonary vascularity mildly engorged. The retrocardiac region on the left remains dense. There is a small left pleural effusion. Slightly increased density is noted medially at the right lung base. There is no pneumothorax. There is calcification in the wall of the aortic arch. The endotracheal tube tip lies 3.5 cm above the carina. The esophagogastric tube tip lies in the gastric cardia. The proximal port lies at the GE junction. IMPRESSION: COPD with superimposed CHF. Left lower lobe atelectasis or pneumonia with possible early similar changes at the right base. Small left pleural effusion. Advancement of the esophagogastric tube by 5-10 cm would assure that the proximal port is indeed below the GE junction. Thoracic aortic atheroscleroses. Electronically Signed   By: David  Martinique M.D.   On: 09/04/2017 07:28   Dg Chest Port 1 View  Result Date: 09/03/2017 CLINICAL DATA:  S/P PICC central line placement. Image reviewed by Dr. Chase Caller. Hx of COPD. Pt is a current smoker. EXAM: PORTABLE CHEST - 1 VIEW COMPARISON:  Earlier film of the same day FINDINGS: Left arm PICC line loops in the innominate vein. Endotracheal tube and  nasogastric tube are stable in position. Heart size upper limits normal.  Atheromatous aorta. Some improvement in the perihilar and bibasilar interstitial and airspace edema or infiltrates. Right costophrenic angle is excluded. IMPRESSION: 1. Malpositioned PICC, looped in the left innominate vein. 2. Some interval improvement in bilateral edema or infiltrates. Electronically Signed   By: Lucrezia Europe M.D.   On: 09/03/2017 13:55   Ir Fluoro Guide Cv Midline Picc Left  Result Date: 09/04/2017 INDICATION: Malpositioned PICC line placed by the IV team. Request is made for repositioning. EXAM: FLUOROSCOPIC GUIDED PICC LINE EXCHANGE MEDICATIONS: None. CONTRAST:  None FLUOROSCOPY TIME:  2 minutes 24 seconds COMPLICATIONS: None immediate. TECHNIQUE: The procedure, risks, benefits, and alternatives were explained to the patient and informed written consent was obtained. The left upper extremity and external portion of the existing PICC line was prepped with chlorhexidine in a sterile fashion, and a sterile drape was applied covering the operative field. Maximum barrier sterile technique with sterile gowns and gloves were used for the procedure. A timeout was performed prior to the initiation of the procedure. The existing PICC line was cannulated with an 0.0018 wire which was advanced through the catheter. The catheter was exchanged for a peel-away sheath, ultimately allowing advancement of a 41 -cm, 5 - [place], triple lumen PICC line to the level of the superior caval atrial junction. A post procedure spot fluoroscopic image was obtained. The catheter easily aspirated and flushed and was sutured in place. A dressing was placed. The patient tolerated the procedure well without immediate post procedural complication. FINDINGS: After catheter exchange, the tip lies within the superior cavoatrial  junction. The catheter aspirates and flushes normally and is ready for immediate use. IMPRESSION: Successful fluoroscopic guided  exchange of left upper extremity approach 41 cm, 5 - French, triple lumen PICC with tip overlying the superior caval atrial junction. The PICC line is ready for immediate use. Read by: Saverio Danker, PA-C Electronically Signed   By: Corrie Mckusick D.O.   On: 09/04/2017 14:35     STS Risk Calculator: Procedure: AV Replacement  Risk of Mortality: 2.355%  Morbidity or Mortality: 18.17%  Long Length of Stay: 8.785%  Short Length of Stay: 32.644%  Permanent Stroke: 0.639%  Prolonged Ventilation: 11.904%  DSW Infection: 0.903%  Renal Failure: 2.577%  Reoperation: 8.849%     Assessment and Plan:   GABRIAL DOMINE is a 66 y.o. male with symptoms of severe, stage D2 symptomatic low flow, low gradient aortic stenosis with NYHA Class III-IV symptoms. I have personally reviewed the patient's recent echocardiogram which is notable for severely reduced LV systolic function (EF 76%) and severe aortic stenosis with peak gradient of 43 mm Hg and mean transvalvular gradient of 25 mm Hg. The patient's dimensionless index is 0.19 and calculated aortic valve area is 0.62 cm.   I have reviewed the natural history of aortic stenosis with the patient. We have discussed the limitations of medical therapy and the poor prognosis associated with symptomatic aortic stenosis. We have reviewed potential treatment options, including palliative medical therapy, conventional surgical aortic valve replacement, and transcatheter aortic valve replacement. We discussed treatment options in the context of this patient's specific comorbid medical conditions.   The patient's predicted risk of mortality with conventional aortic valve replacement is 2.355% primarily based on his age, presumed severe COPD, and severe LV dysfunction. Other significant comorbid conditions include pre diabetes, LBBB, non obst CAD and IBD. TAVR seems like a reasonable treatment option for this patient pending formal cardiac surgical consultation. We  discussed typical evaluation which will require a gated cardiac CTA and a CTA of the chest/abdomen/pelvis to evaluate both his cardiac anatomy and peripheral vasculature.  We will try to get PFTs, carotid dopplers and CT scans done while admitted. We will plan to let him recover from his acute illness and repeat echo in 1 month and continued TAVR work up.   Signed, Angelena Form, PA-C  09/07/2017 11:16 AM  Patient seen, examined. Available data reviewed. Agree with findings, assessment, and plan as outlined by Nell Range, PA-C.  The patient is independently interviewed and examined this afternoon.  His daughter is at the bedside.  The patient reports a heart murmur for probably 30 years.  He does not recall ever having an echocardiogram until his current hospital admission.  On my exam today, he is alert and oriented, in no distress, sitting up in bed.  He is a thin gentleman.  HEENT is normal.  Carotid upstrokes are normal with a left carotid bruit.  Lung fields are clear with some decrease in expiration with a prolonged expiratory phase.  His heart sounds are very distant.  There is a 2/6 harsh late peaking systolic murmur best heard at the apex.  His murmur is very distant at the right upper sternal border.  There is no appreciable diastolic murmur.  The abdomen is soft, thin, nontender.  Lower extremities show no edema.  I have personally reviewed the patient's echocardiogram and cardiac catheterization images.  He has a moderate lesion at the ostium of the RCA that has the typical appearance of catheter-induced spasm.  Otherwise  the patient has mild nonobstructive coronary artery disease.  His aortic valve is heavily calcified by plain fluoroscopy with reduced mobility of the aortic valve leaflets.  On echo, there is very severe LV systolic dysfunction.  The left ventricle was dilated.  The aortic valve is severely calcified with restricted leaflet mobility.  The aortic valve may be bicuspid.   Mean transvalvular gradient is less than 40 mmHg, the patient's dimensionless index, severe calcification and restriction of aortic valve leaflets, moderate gradient despite severe LV dysfunction, and calculated valve area of approximately 0.7 cm by both cath and echo are all suggestive of severe low flow low gradient aortic stenosis.  As detailed above, I reviewed the natural history of aortic stenosis with the patient and his daughter.  He currently is hospitalized with acute on chronic respiratory failure with parainfluenza virus on a background of probable severe COPD.  The patient admits to fairly long-standing progressive shortness of breath.  He has still been able to get out and do work until he recently got sick.  He is quite functional and I suspect he will be a candidate for aortic valve replacement, determination will need to be made whether he is best treated with TAVR or surgical AVR.  While his calculated STS risk of mortality is less than 3% based on the STS risk calculator, I suspect his risk is higher than that considering the very severe LV dysfunction.  The patient will undergo CT angiography of the chest abdomen and pelvis as well as a gated cardiac CT scan to assess his aortic valve anatomy.  This data will add further information as to what may be the best treatment for his aortic stenosis.  The patient will likely be discharged from the hospital tomorrow and outpatient follow-up will be arranged.  An echocardiogram in approximately 1 month will be arranged to evaluate whether his acute illness may have exacerbated the degree of LV dysfunction.  Will arrange outpatient cardiac surgical evaluation as well.    All of the patient's questions are answered today.  We will arrange outpatient follow-up in valve clinic.  Sherren Mocha, M.D. 09/07/2017 4:55 PM

## 2017-09-07 NOTE — Progress Notes (Signed)
Preliminary results by tech - Carotid Duplex Completed. No evidence of a significant stenosis in bilateral carotid arteries.  Oda Cogan, BS, RDMS, RVT

## 2017-09-07 NOTE — Discharge Instructions (Signed)
[place]: You have been scheduled for a post hospital follow up appointment at [facility] [place] available on [facility] Thursday 09/14/2017 at 3:20 pm with Dr. Glori Bickers Garage Code: [phone] Please take your medications the day of your appointment as you normally would. Bring medication bottles with you to appointment.  [facility] Stay Proper nutrition can help your body recover from illness and injury.   Foods and beverages high in protein, vitamins, and minerals help rebuild muscle loss, promote healing, & reduce fall risk.   In addition to eating healthy foods, a nutrition shake is an easy, delicious way to get the nutrition you need during and after your hospital stay  It is recommended that you continue to drink 2 bottles per day of:       Ensure plus or boost plus for at least 1 month (30 days) after your hospital stay   Tips for adding a nutrition shake into your routine: As allowed, drink one with vitamins or medications instead of water or juice Enjoy one as a tasty mid-morning or afternoon snack Drink cold or make a milkshake out of it Drink one instead of milk with cereal or snacks Use as a coffee creamer   Available at the following grocery stores and pharmacies:           * [facility] [phone]            For COUPONS visit: www.ensure.com/join or http://dawson-may.[email or web address]/   Suggested Substitutions Ensure Plus = Boost Plus = Carnation Breakfast Essentials = Boost Compact Ensure Active Clear = Boost Breeze Glucerna Shake = Boost Glucose Control = Carnation Breakfast Essentials SUGAR FREE

## 2017-09-07 NOTE — Progress Notes (Signed)
Advanced Heart Failure Rounding Note  PCP:  Primary Cardiologist: New. Dr Haroldine Laws   Subjective:    Denies SOB or CP. Wants to go home.   RHC/LHC   Prox RCA lesion, 50 %stenosed.  Mid RCA lesion, 30 %stenosed.  Mid LAD lesion, 30 %stenosed.  Mid Cx to Dist Cx lesion, 20 %stenosed.   Findings:  Ao = 96/67 (81) LV 140/19 RA = 4 RV = 31/7 PA = 28/13 (21) PCW = 14 Fick cardiac output/index =3.7/1.9 PVR = 1.9 WU Ao sat = 97% PA sat = 69%, 65%  Aortic valve  Peak gradient 49mHG Mean gradient 24 mmHG ECHO EF 15%.  Low gradient aortic stenosis.    Objective:   Weight Range: 156 lb 8.4 oz (71 kg) Body mass index is 22.46 kg/m.   Vital Signs:   Temp:  [97.5 F (36.4 C)-97.7 F (36.5 C)] 97.7 F (36.5 C) (10/31 2350) Pulse Rate:  [64-94] 72 (11/01 0300) Resp:  [12-28] 16 (11/01 0300) BP: (111-142)/(63-93) 116/71 (11/01 0300) SpO2:  [92 %-99 %] 93 % (11/01 0300) Weight:  [156 lb 8.4 oz (71 kg)] 156 lb 8.4 oz (71 kg) (11/01 0425) Last BM Date: 09/02/17  Weight change: Filed Weights   09/05/17 2130 09/06/17 0416 09/07/17 0425  Weight: 160 lb 11.5 oz (72.9 kg) 158 lb (71.7 kg) 156 lb 8.4 oz (71 kg)    Intake/Output:   Intake/Output Summary (Last 24 hours) at 09/07/17 0849 Last data filed at 09/06/17 2030  Gross per 24 hour  Intake              240 ml  Output              402 ml  Net             -162 ml      Physical Exam    General:  Lying in bed. No resp difficulty HEENT: normal Neck: supple. JVP 5-6. Carotids 2+ bilat; no bruits. No lymphadenopathy or thryomegaly appreciated. Cor: PMI nondisplaced. Regular rate & rhythm. No rubs, gallops. AS.  Lungs: clear bur decreased throughout  Abdomen: soft, nontender, nondistended. No hepatosplenomegaly. No bruits or masses. Good bowel sounds. Extremities: no cyanosis, clubbing, rash, edema Neuro: alert & orientedx3, cranial nerves grossly intact. moves all 4 extremities w/o difficulty. Affect  pleasant    Telemetry   NSR 80-90s personally reviewed.    EKG    Sinus tach 103 LBBB Personally reviewed Labs    CBC  Recent Labs  09/06/17 0435 09/07/17 0335  WBC 12.7* 9.0  NEUTROABS  --  5.8  HGB 17.1* 17.1*  HCT 51.3 49.9  MCV 95.2 94.0  PLT 103* 1599   Basic Metabolic Panel  Recent Labs  09/06/17 0435 09/07/17 0335  NA 141 140  K 4.1 3.7  CL 102 104  CO2 30 29  GLUCOSE 114* 95  BUN 34* 34*  CREATININE 0.94 0.80  CALCIUM 8.9 8.7*   Liver Function Tests  Recent Labs  09/05/17 0343  AST 42*  ALT 40  ALKPHOS 82  BILITOT 0.9  PROT 5.9*  ALBUMIN 3.1*   No results for input(s): LIPASE, AMYLASE in the last 72 hours. Cardiac Enzymes No results for input(s): CKTOTAL, CKMB, CKMBINDEX, TROPONINI in the last 72 hours.  BNP: BNP (last 3 results)  Recent Labs  09/01/17 2155 09/03/17 0139  BNP 885.5* 1,167.7*    ProBNP (last 3 results) No results for input(s): PROBNP in the last 8760 hours.  D-Dimer No results for input(s): DDIMER in the last 72 hours. Hemoglobin A1C No results for input(s): HGBA1C in the last 72 hours. Fasting Lipid Panel  Recent Labs  09/07/17 0335  CHOL 145  HDL 51  LDLCALC 76  TRIG 90  CHOLHDL 2.8   Thyroid Function Tests No results for input(s): TSH, T4TOTAL, T3FREE, THYROIDAB in the last 72 hours.  Invalid input(s): FREET3  Other results:   Imaging    No results found.   Medications:     Scheduled Medications: . aspirin  81 mg Oral Daily  . atorvastatin  80 mg Oral q1800  . chlorhexidine gluconate (MEDLINE KIT)  15 mL Mouth Rinse BID  . Chlorhexidine Gluconate Cloth  6 each Topical Daily  . enoxaparin (LOVENOX) injection  40 mg Subcutaneous Q24H  . feeding supplement (ENSURE ENLIVE)  237 mL Oral BID BM  . insulin aspart  2-6 Units Subcutaneous Q4H  . ipratropium-albuterol  3 mL Nebulization TID  . predniSONE  20 mg Oral Q breakfast  . sodium chloride flush  10-40 mL Intracatheter Q12H  .  sodium chloride flush  3 mL Intravenous Q12H    Infusions: . sodium chloride    . sodium chloride    . cefTRIAXone (ROCEPHIN)  IV Stopped (09/06/17 1046)  . lactated ringers 10 mL/hr at 09/06/17 0532    PRN Medications: sodium chloride, sodium chloride, acetaminophen, albuterol, clonazePAM, ondansetron (ZOFRAN) IV, sodium chloride flush, sodium chloride flush    Patient Profile  66 y/o with presumed COPD with ongoing tobacco, IBD s/p previous partial colectomy admitted with acute respiratory failure. Parainfluenza and PCT +. Echo with EF 15% and critical low-gradient AS   Assessment/Plan   1. Acute hypoxic respiratory failure - likely multifactorial COPD, acute parainfluenzae infection  and acute HF - Improved with diuresis. - Extubated 10/29 - Resolved. Sats stable.   2. Acute systolic HF - Echo EF 37%. RV normal. Likely critical low gradient AS but may also have component of ischemic CM  - Given normal RV function suspect severe LV dysfunction represents end-stage low-gradient AS +/- ischemic CM and not viral cardiomyopathy. - Volume status stable. Add 12.5 mg spiro daily.  - Add 0.125 mg digoxin daily.  - Given severe AS and HF will not add b-block or ACE/ARB currently  3. Probable low-gradient critical aortic stenosis - TAVR team will evaluate today.   4. AKI - Creatinine improved 0.8.   5. Elevated troponin - possible ischemia versus HF. No CP.  - continue ASA, statin. No b-blocker with acute HF & respiratory failure.     Length of Stay: [place], NP  09/07/2017, 8:49 AM  Advanced Heart Failure Team Pager [phone] (M-F; 7a - 4p)  Please contact [facility] for night-coverage after hours (4p -7a ) and weekends on [email or web address]  Patient seen and examined with Darrick Grinder, NP. We discussed all aspects of the encounter. I agree with the assessment and plan as stated above.   Results of cath reviewed with him. No significant CAD. CO ok. Respiratory status  improving. TAVR team has seen today (thanks!). Will get scans and PFTs while in house. Repeat echo 1 month to see if EF improves with clearing of viral infection. Will need to decide TAVR vs SAVR. Likely home tomorrow. Start low-dose spiro.  Glori Bickers, MD  4:11 PM

## 2017-09-07 NOTE — Evaluation (Signed)
Physical Therapy Evaluation Patient Details Name: Kenneth Mills MRN: [id number] DOB: 14-Aug-1951 Today's Date: 09/07/2017   History of Present Illness  66 yo male with past Hx of COPD presented with increased shortness of breath, fever, cough and chills, adm 10/26; Patient found to have acute hypercapnic resp failure, CHF, and  + for viral pna;  required intubation 10/26--EXTubated 10/29  Clinical Impression  Pt admitted with above diagnosis. Pt currently with functional limitations due to the deficits listed below (see PT Problem List). * Pt will benefit from skilled PT to increase their independence and safety with mobility to allow discharge to the venue listed below.   Pt motivated and cooperative; mildly confused at times, having difficulty answering some questions regarding living situation; Fatigues easily but amb ~80' with RW and min assist;  HHPT, May need RW for home depending on progress; VS--resting-->exertional-->after amb:     HR--->75---->109----->65     RR--->29--->30---->16 O2 sats 91% to 94% on RA BP 121/71     Follow Up Recommendations Home health PT    Equipment Recommendations  Other (comment) (TBD--may need RW depending on progress)    Recommendations for Other Services       Precautions / Restrictions Precautions Precautions: Fall Restrictions Weight Bearing Restrictions: No      Mobility  Bed Mobility Overal bed mobility: Needs Assistance Bed Mobility: Supine to Sit     Supine to sit: Min guard     General bed mobility comments: cues for task completion, sequencing; requires incr time  Transfers Overall transfer level: Needs assistance Equipment used: Rolling walker (2 wheeled) Transfers: Sit to/from Stand Sit to Stand: Min assist         General transfer comment: assist to rise, cues for hand placement and overall safety  Ambulation/Gait Ambulation/Gait assistance: Min assist;Min guard Ambulation Distance (Feet): 80 Feet Assistive  device: Rolling walker (2 wheeled) Gait Pattern/deviations: Step-through pattern;Decreased stride length     General Gait Details: verbal cues for RW safety, step length; no overt LOB but unsteady initially, improved with distance  Stairs            Wheelchair Mobility    Modified Rankin (Stroke Patients Only)       Balance Overall balance assessment: Needs assistance Sitting-balance support: Feet supported;No upper extremity supported Sitting balance-Leahy Scale: Fair       Standing balance-Leahy Scale: Fair Standing balance comment: able to static stand after amb without UE support                             Pertinent Vitals/Pain Pain Assessment: 0-10 Pain Score: 1  Pain Location: pain from shot he just had in abd Pain Descriptors / Indicators: Tingling Pain Intervention(s): Monitored during session    Home Living Family/patient expects to be discharged to:: Private residence     Type of Home: House Home Access: Stairs to enter   Technical brewer of Steps: 3 Home Layout: One level Home Equipment: None Additional Comments: pt is mildly confused, unreliable historian alhtough answers some questions without difficulty, no family present    Prior Function Level of Independence: Independent               Hand Dominance        Extremity/Trunk Assessment   Upper Extremity Assessment Upper Extremity Assessment: Defer to OT evaluation    Lower Extremity Assessment Lower Extremity Assessment: Overall WFL for tasks assessed  Communication   Communication: No difficulties  Cognition Arousal/Alertness: Awake/alert Behavior During Therapy: WFL for tasks assessed/performed Overall Cognitive Status: Impaired/Different from baseline Area of Impairment: Following commands;Problem solving;Safety/judgement                       Following Commands: Follows one step commands with increased time;Follows multi-step commands  consistently Safety/Judgement: Decreased awareness of deficits   Problem Solving: Slow processing;Requires verbal cues;Requires tactile cues General Comments: pt reports he generally feels fuzzy; he requires incr time to respond to questions as well as direct commands; he states he wants to leave the hospital today and appears to have diminished insight into the reasons he needs to be here      General Comments      Exercises     Assessment/Plan    PT Assessment Patient needs continued PT services  PT Problem List Decreased activity tolerance;Decreased mobility;Decreased knowledge of use of DME;Cardiopulmonary status limiting activity;Decreased safety awareness       PT Treatment Interventions DME instruction;Gait training;Functional mobility training;Therapeutic activities;Therapeutic exercise;Patient/family education;Stair training    PT Goals (Current goals can be found in the Care Plan section)  Acute Rehab PT Goals Patient Stated Goal: get out of here PT Goal Formulation: With patient Time For Goal Achievement: 09/21/17 Potential to Achieve Goals: Good    Frequency Min 3X/week   Barriers to discharge        Co-evaluation               AM-PAC PT "6 Clicks" Daily Activity  Outcome Measure Difficulty turning over in bed (including adjusting bedclothes, sheets and blankets)?: A Little Difficulty moving from lying on back to sitting on the side of the bed? : A Little Difficulty sitting down on and standing up from a chair with arms (e.g., wheelchair, bedside commode, etc,.)?: Unable Help needed moving to and from a bed to chair (including a wheelchair)?: A Little Help needed walking in hospital room?: A Little Help needed climbing 3-5 steps with a railing? : A Little 6 Click Score: 16    End of Session Equipment Utilized During Treatment: Gait belt Activity Tolerance: Patient tolerated treatment well Patient left: in chair;with call bell/phone within reach;with  chair alarm set Nurse Communication: Mobility status PT Visit Diagnosis: Difficulty in walking, not elsewhere classified (R26.2)    Time: 8127-5170 PT Time Calculation (min) (ACUTE ONLY): 25 min   Charges:   PT Evaluation $PT Eval Moderate Complexity: 1 Mod PT Treatments $Gait Training: 8-22 mins   PT G CodesKenyon Ana, PT Pager: [phone] 09/07/2017   [facility] 09/07/2017, 9:29 AM

## 2017-09-07 NOTE — Progress Notes (Signed)
PROGRESS NOTE  Kenneth Mills FGH:[id number] DOB: 06-Feb-1951 DOA: 09/01/2017 PCP: Patient, No Pcp Per  HPI/Recap of past 76 hours: 66 year old male with a past medical history of COPD, IBS, presented with shortness of breath for 2 weeks.  Patient was diagnosed with acute hypoxic respiratory failure secondary to viral pneumonia and also found to have acute systolic CHF likely secondary to severe aortic stenosis.  Patient was initially intubated on 10/26 and extubated on 10/29.  Today, patient reports feeling better, denies any worsening shortness of breath, chest pain, dizziness, abdominal pain, nausea/vomiting, fever/chills.  Patient reported walking up and down the hallway and feels almost back to baseline  Assessment/Plan: Principal Problem:   Respiratory failure ([place]) Active Problems:   Aortic stenosis, severe   COPD (chronic obstructive pulmonary disease) (HCC)   IBD (inflammatory bowel disease)   NICM (nonischemic cardiomyopathy) (HCC)   Tobacco abuse   Anxiety   LBBB (left bundle branch block)  #Acute hypoxic respiratory failure Resolving Secondary to viral pneumonia with parainfluenza and bacterial superinfection Intubated on 10/26 and extubated on 10/29 History of COPD, wants to quit tobacco Continue IV ceftriaxone for a total of 7 days Continue Pred 20 mg daily Duo nebs PT/OT on board Monitor closely  #Acute systolic CHF Secondary to severe aortic stenosis BNP 1167 Echo showed severe LV dysfunction in the setting of severe aortic stenosis Right and left heart cath done on 10/31 showed mild nonobstructive CAD with severe nonischemic cardiomyopathy Cardiology on board, workup in progress for possible TAVR as an outpatient  #Elevated troponins -Trending downwards, no need to trend Likely due to severe CHF  #AK I Resolved Likely due to ATN  #Thrombocytopenia Unknown etiology Daily CBC  #Prediabetes A1c 6.2 Monitor blood sugars as patient is on steroid,  SSI Lifestyle modification discussed  #Anxiety Continue clonazepam      Code Status: Full  Family Communication: Daughter at bedside  Disposition Plan: Home once stable   Consultants:  Pulmonology  Cardiology  Procedures:  Intubated on 10/26 and extubated on 10/29  Antimicrobials:  IV ceftriaxone  DVT prophylaxis:  Lovenox   Objective: Vitals:   09/07/17 0908 09/07/17 1038 09/07/17 1039 09/07/17 1225  BP:  93/69  108/76  Pulse:   93 93  Resp:  15  14  Temp:      TempSrc:      SpO2: 94%   94%  Weight:      Height:        Intake/Output Summary (Last 24 hours) at 09/07/17 1345 Last data filed at 09/07/17 1300  Gross per 24 hour  Intake              303 ml  Output              775 ml  Net             -472 ml   Filed Weights   09/05/17 2130 09/06/17 0416 09/07/17 0425  Weight: 72.9 kg (160 lb 11.5 oz) 71.7 kg (158 lb) 71 kg (156 lb 8.4 oz)    Exam:   General:  Alert, awake, oriented X3  Cardiovascular: S1-S2 present no added heart sound  Respiratory: Chest clear bilaterally with reduced breath sounds  Abdomen: Soft, nontender, nondistended, bowel sounds positive  Musculoskeletal: No pedal edema  Skin: Normal  Psychiatry: Normal mood   Data Reviewed: CBC:  Recent Labs Lab 09/01/17 2155 09/02/17 0259 09/03/17 0139 09/04/17 0511 09/05/17 0343 09/06/17 0435 09/07/17 0335  WBC 9.9 13.4*  15.9* 14.4* 13.0* 12.7* 9.0  NEUTROABS 4.8 12.3*  --   --   --   --  5.8  HGB 17.3* 17.3* 15.1 15.0 16.5 17.1* 17.1*  HCT 53.5* 52.9* 46.1 47.1 48.7 51.3 49.9  MCV 97.1 96.5 94.9 97.5 95.7 95.2 94.0  PLT 129* 113* 96* 99* 100* 103* 812*   Basic Metabolic Panel:  Recent Labs Lab 09/02/17 0259 09/02/17 0928 09/02/17 1630 09/03/17 0139 09/04/17 0511 09/05/17 0343 09/06/17 0435 09/07/17 0335  NA 139  --   --  139 141 140 141 140  K 4.6  --   --  3.8 4.3 4.5 4.1 3.7  CL 112*  --   --  108 103 99* 102 104  CO2 16*  --   --  22 30 32 30 29    GLUCOSE 181*  --   --  217* 197* 135* 114* 95  BUN 12  --   --  19 29* 32* 34* 34*  CREATININE 1.31*  --   --  1.07 0.91 0.89 0.94 0.80  CALCIUM 7.3*  --   --  8.1* 8.5* 8.8* 8.9 8.7*  MG 1.6* 2.4 2.2 2.2 2.1  --   --   --   PHOS 2.7 3.0 2.2* 2.3* 3.1  --   --   --    GFR: Estimated Creatinine Clearance: 91.2 mL/min (by C-G formula based on SCr of 0.8 mg/dL). Liver Function Tests:  Recent Labs Lab 09/01/17 2155 09/02/17 0259 09/03/17 0139 09/05/17 0343  AST 128* 217* 60* 42*  ALT 68* 110* 58 40  ALKPHOS 132* 117 78 82  BILITOT 0.9 1.5* 0.8 0.9  PROT 7.3 5.8* 5.3* 5.9*  ALBUMIN 4.1 3.2* 2.8* 3.1*   No results for input(s): LIPASE, AMYLASE in the last 168 hours. No results for input(s): AMMONIA in the last 168 hours. Coagulation Profile:  Recent Labs Lab 09/02/17 0259  INR 1.15   Cardiac Enzymes:  Recent Labs Lab 09/01/17 2155 09/02/17 0259 09/02/17 0928 09/02/17 1630 09/03/17 0139  TROPONINI 0.44* 1.41* 2.57* 2.03* 1.50*   BNP (last 3 results) No results for input(s): PROBNP in the last 8760 hours. HbA1C: No results for input(s): HGBA1C in the last 72 hours. CBG:  Recent Labs Lab 09/06/17 1942 09/06/17 2323 09/07/17 0353 09/07/17 0751 09/07/17 1127  GLUCAP 212* 88 91 89 195*   Lipid Profile:  Recent Labs  09/07/17 0335  CHOL 145  HDL 51  LDLCALC 76  TRIG 90  CHOLHDL 2.8   Thyroid Function Tests: No results for input(s): TSH, T4TOTAL, FREET4, T3FREE, THYROIDAB in the last 72 hours. Anemia Panel: No results for input(s): VITAMINB12, FOLATE, FERRITIN, TIBC, IRON, RETICCTPCT in the last 72 hours. Urine analysis:    Component Value Date/Time   COLORURINE YELLOW 09/01/2017 2350   APPEARANCEUR HAZY (A) 09/01/2017 2350   LABSPEC 1.020 09/01/2017 2350   PHURINE 6.0 09/01/2017 2350   GLUCOSEU 100 (A) 09/01/2017 2350   HGBUR MODERATE (A) 09/01/2017 2350   BILIRUBINUR NEGATIVE 09/01/2017 2350   KETONESUR NEGATIVE 09/01/2017 2350   PROTEINUR  100 (A) 09/01/2017 2350   NITRITE NEGATIVE 09/01/2017 2350   LEUKOCYTESUR NEGATIVE 09/01/2017 2350   Sepsis Labs: @LABRCNTIP (procalcitonin:4,lacticidven:4)  ) Recent Results (from the past 240 hour(s))  Culture, blood (Routine X 2) w Reflex to ID Panel     Status: None (Preliminary result)   Collection Time: 09/01/17 10:00 PM  Result Value Ref Range Status   Specimen Description BLOOD RIGHT ANTECUBITAL  Final  Special Requests   Final    BOTTLES DRAWN AEROBIC AND ANAEROBIC Blood Culture results may not be optimal due to an inadequate volume of blood received in culture bottles   Culture   Final    NO GROWTH 4 DAYS Performed at [facility], [place] [address] [place], [place] [address]    Report Status PENDING  Incomplete  Culture, blood (Routine X 2) w Reflex to ID Panel     Status: None (Preliminary result)   Collection Time: 09/01/17 10:00 PM  Result Value Ref Range Status   Specimen Description BLOOD LEFT ANTECUBITAL  Final   Special Requests   Final    BOTTLES DRAWN AEROBIC AND ANAEROBIC Blood Culture results may not be optimal due to an inadequate volume of blood received in culture bottles   Culture   Final    NO GROWTH 4 DAYS Performed at [facility], [place] [address] [place], [place] [address]    Report Status PENDING  Incomplete  Urine culture     Status: None   Collection Time: 09/01/17 11:50 PM  Result Value Ref Range Status   Specimen Description URINE, CATHETERIZED  Final   Special Requests NONE  Final   Culture   Final    NO GROWTH Performed at [facility], [address], [place],  [address]    Report Status 09/03/2017 FINAL  Final  Respiratory Panel by PCR     Status: Abnormal   Collection Time: 09/02/17  2:25 AM  Result Value Ref Range Status   Adenovirus NOT DETECTED NOT DETECTED Final   Coronavirus 229E NOT DETECTED NOT DETECTED Final   Coronavirus HKU1 NOT DETECTED NOT DETECTED Final   Coronavirus NL63 NOT DETECTED NOT  DETECTED Final   Coronavirus OC43 NOT DETECTED NOT DETECTED Final   Metapneumovirus NOT DETECTED NOT DETECTED Final   Rhinovirus / Enterovirus NOT DETECTED NOT DETECTED Final   Influenza A NOT DETECTED NOT DETECTED Final   Influenza B NOT DETECTED NOT DETECTED Final   Parainfluenza Virus 1 NOT DETECTED NOT DETECTED Final   Parainfluenza Virus 2 DETECTED (A) NOT DETECTED Final   Parainfluenza Virus 3 NOT DETECTED NOT DETECTED Final   Parainfluenza Virus 4 NOT DETECTED NOT DETECTED Final   Respiratory Syncytial Virus NOT DETECTED NOT DETECTED Final   Bordetella pertussis NOT DETECTED NOT DETECTED Final   Chlamydophila pneumoniae NOT DETECTED NOT DETECTED Final   Mycoplasma pneumoniae NOT DETECTED NOT DETECTED Final  MRSA PCR Screening     Status: None   Collection Time: 09/02/17  2:25 AM  Result Value Ref Range Status   MRSA by PCR NEGATIVE NEGATIVE Final    Comment:        The GeneXpert MRSA Assay (FDA approved for NASAL specimens only), is one component of a comprehensive MRSA colonization surveillance program. It is not intended to diagnose MRSA infection nor to guide or monitor treatment for MRSA infections.   Culture, respiratory (tracheal aspirate)     Status: None   Collection Time: 09/02/17  4:03 AM  Result Value Ref Range Status   Specimen Description TRACHEAL ASPIRATE  Final   Special Requests NONE  Final   Gram Stain   Final    MODERATE WBC PRESENT, PREDOMINANTLY PMN FEW GRAM NEGATIVE RODS FEW GRAM POSITIVE COCCI IN PAIRS    Culture Consistent with normal respiratory flora.  Final   Report Status 09/04/2017 FINAL  Final      Studies: No results found.  Scheduled Meds: . aspirin  81 mg Oral Daily  . atorvastatin  80 mg Oral q1800  . chlorhexidine gluconate (MEDLINE KIT)  15 mL Mouth Rinse BID  . Chlorhexidine Gluconate Cloth  6 each Topical Daily  . digoxin  0.125 mg Oral Daily  . enoxaparin (LOVENOX) injection  40 mg Subcutaneous Q24H  . feeding  supplement (ENSURE ENLIVE)  237 mL Oral BID BM  . insulin aspart  2-6 Units Subcutaneous Q4H  . iopamidol      . predniSONE  20 mg Oral Q breakfast  . sodium chloride flush  10-40 mL Intracatheter Q12H  . sodium chloride flush  3 mL Intravenous Q12H  . spironolactone  12.5 mg Oral Daily    Continuous Infusions: . sodium chloride    . sodium chloride    . [START ON 09/08/2017] cefTRIAXone (ROCEPHIN)  IV    . lactated ringers 10 mL/hr at 09/06/17 0532     LOS: 6 days     Alma Friendly, MD [facility]   If 7PM-7AM, please contact night-coverage [email or web address] Password [email or web address] 09/07/2017, 1:45 PM

## 2017-09-08 ENCOUNTER — Inpatient Hospital Stay (HOSPITAL_COMMUNITY): Payer: Medicare HMO

## 2017-09-08 DIAGNOSIS — Z23 Encounter for immunization: Secondary | ICD-10-CM | POA: Diagnosis not present

## 2017-09-08 LAB — CBC WITH DIFFERENTIAL/PLATELET
Basophils Absolute: 0 10*3/uL (ref 0.0–0.1)
Basophils Relative: 0 %
EOS ABS: 0 10*3/uL (ref 0.0–0.7)
EOS PCT: 0 %
HCT: 51.6 % (ref 39.0–52.0)
HEMOGLOBIN: 17.3 g/dL — AB (ref 13.0–17.0)
LYMPHS ABS: 2.5 10*3/uL (ref 0.7–4.0)
Lymphocytes Relative: 28 %
MCH: 31.3 pg (ref 26.0–34.0)
MCHC: 33.5 g/dL (ref 30.0–36.0)
MCV: 93.3 fL (ref 78.0–100.0)
MONO ABS: 0.7 10*3/uL (ref 0.1–1.0)
MONOS PCT: 8 %
NEUTROS PCT: 64 %
Neutro Abs: 5.8 10*3/uL (ref 1.7–7.7)
Platelets: 122 10*3/uL — ABNORMAL LOW (ref 150–400)
RBC: 5.53 MIL/uL (ref 4.22–5.81)
RDW: 14 % (ref 11.5–15.5)
WBC: 9.1 10*3/uL (ref 4.0–10.5)

## 2017-09-08 LAB — BASIC METABOLIC PANEL
Anion gap: 8 (ref 5–15)
BUN: 31 mg/dL — AB (ref 6–20)
CO2: 27 mmol/L (ref 22–32)
CREATININE: 0.98 mg/dL (ref 0.61–1.24)
Calcium: 8.9 mg/dL (ref 8.9–10.3)
Chloride: 105 mmol/L (ref 101–111)
GFR calc Af Amer: >60 mL/min (ref >60)
GFR calc non Af Amer: >60 mL/min (ref >60)
Glucose, Bld: 153 mg/dL — ABNORMAL HIGH (ref 65–99)
Potassium: 3.5 mmol/L (ref 3.5–5.1)
SODIUM: 140 mmol/L (ref 135–145)

## 2017-09-08 LAB — GLUCOSE, CAPILLARY
GLUCOSE-CAPILLARY: 156 mg/dL — AB (ref 65–99)
Glucose-Capillary: 79 mg/dL (ref 65–99)

## 2017-09-08 MED ORDER — PREDNISONE 10 MG PO TABS: 10.0000 mg | ORAL_TABLET | Freq: Every day | ORAL | 0 refills | Status: DC

## 2017-09-08 MED ORDER — DIGOXIN 125 MCG PO TABS: 0.1250 mg | ORAL_TABLET | Freq: Every day | ORAL | 0 refills | Status: DC

## 2017-09-08 MED ORDER — ATORVASTATIN CALCIUM 20 MG PO TABS: 20.0000 mg | ORAL_TABLET | Freq: Every day | ORAL | 0 refills | Status: DC

## 2017-09-08 MED ORDER — SPIRONOLACTONE 25 MG PO TABS: 12.5000 mg | ORAL_TABLET | Freq: Every day | ORAL | 0 refills | Status: DC

## 2017-09-08 NOTE — Progress Notes (Signed)
Advanced Heart Failure Rounding Note  PCP:  Primary Cardiologist: New. Dr Haroldine Laws   Subjective:    Denies CP or SOB. Wants to go home.   Seen by TAVR team yesterday. CT scans completed but not read yet.   RHC/LHC   Prox RCA lesion, 50 %stenosed.  Mid RCA lesion, 30 %stenosed.  Mid LAD lesion, 30 %stenosed.  Mid Cx to Dist Cx lesion, 20 %stenosed.   Findings:  Ao = 96/67 (81) LV 140/19 RA = 4 RV = 31/7 PA = 28/13 (21) PCW = 14 Fick cardiac output/index =3.7/1.9 PVR = 1.9 WU Ao sat = 97% PA sat = 69%, 65%  Aortic valve  Peak gradient 30mHG Mean gradient 24 mmHG ECHO EF 15%.  Low gradient aortic stenosis.    Objective:   Weight Range: 155 lb 13.8 oz (70.7 kg) Body mass index is 22.36 kg/m.   Vital Signs:   Temp:  [97.6 F (36.4 C)-98.7 F (37.1 C)] 98.7 F (37.1 C) (11/02 0738) Pulse Rate:  [53-93] 58 (11/02 0300) Resp:  [14-23] 23 (11/01 1958) BP: (93-121)/(64-79) 120/79 (11/02 0300) SpO2:  [93 %-96 %] 96 % (11/02 0300) Weight:  [155 lb 13.8 oz (70.7 kg)] 155 lb 13.8 oz (70.7 kg) (11/02 0700) Last BM Date: 09/06/17  Weight change: Filed Weights   09/06/17 0416 09/07/17 0425 09/08/17 0700  Weight: 158 lb (71.7 kg) 156 lb 8.4 oz (71 kg) 155 lb 13.8 oz (70.7 kg)    Intake/Output:   Intake/Output Summary (Last 24 hours) at 09/08/17 0811 Last data filed at 09/08/17 0300  Gross per 24 hour  Intake              233 ml  Output             1575 ml  Net            -1342 ml      Physical Exam    General:  Well appearing. No resp difficulty HEENT: normal Neck: supple. no JVD. Carotids 2+ bilat; no bruits. No lymphadenopathy or thryomegaly appreciated. Cor: PMI nondisplaced. Distant heart sounds Regular rate & rhythm. AS murmur not well heard Lungs: clear decreased throughout Abdomen: soft, nontender, nondistended. No hepatosplenomegaly. No bruits or masses. Good bowel sounds. Extremities: no cyanosis, clubbing, rash, edema Neuro: alert &  orientedx3, cranial nerves grossly intact. moves all 4 extremities w/o difficulty. Affect pleasant   Telemetry   NSR 80s personally reviewed.    EKG    Sinus tach 103 LBBB Personally reviewed Labs    CBC  Recent Labs  09/07/17 0335 09/08/17 0648  WBC 9.0 9.1  NEUTROABS 5.8 5.8  HGB 17.1* 17.3*  HCT 49.9 51.6  MCV 94.0 93.3  PLT 107* 1831   Basic Metabolic Panel  Recent Labs  09/07/17 0335 09/08/17 0648  NA 140 140  K 3.7 3.5  CL 104 105  CO2 29 27  GLUCOSE 95 153*  BUN 34* 31*  CREATININE 0.80 0.98  CALCIUM 8.7* 8.9   Liver Function Tests No results for input(s): AST, ALT, ALKPHOS, BILITOT, PROT, ALBUMIN in the last 72 hours. No results for input(s): LIPASE, AMYLASE in the last 72 hours. Cardiac Enzymes No results for input(s): CKTOTAL, CKMB, CKMBINDEX, TROPONINI in the last 72 hours.  BNP: BNP (last 3 results)  Recent Labs  09/01/17 2155 09/03/17 0139  BNP 885.5* 1,167.7*    ProBNP (last 3 results) No results for input(s): PROBNP in the last 8760 hours.  D-Dimer No results for input(s): DDIMER in the last 72 hours. Hemoglobin A1C No results for input(s): HGBA1C in the last 72 hours. Fasting Lipid Panel  Recent Labs  09/07/17 0335  CHOL 145  HDL 51  LDLCALC 76  TRIG 90  CHOLHDL 2.8   Thyroid Function Tests No results for input(s): TSH, T4TOTAL, T3FREE, THYROIDAB in the last 72 hours.  Invalid input(s): FREET3  Other results:   Imaging    No results found.   Medications:     Scheduled Medications: . aspirin  81 mg Oral Daily  . atorvastatin  80 mg Oral q1800  . chlorhexidine gluconate (MEDLINE KIT)  15 mL Mouth Rinse BID  . Chlorhexidine Gluconate Cloth  6 each Topical Daily  . digoxin  0.125 mg Oral Daily  . enoxaparin (LOVENOX) injection  40 mg Subcutaneous Q24H  . feeding supplement (ENSURE ENLIVE)  237 mL Oral BID BM  . insulin aspart  2-6 Units Subcutaneous Q4H  . predniSONE  20 mg Oral Q breakfast  . sodium  chloride flush  10-40 mL Intracatheter Q12H  . sodium chloride flush  3 mL Intravenous Q12H  . spironolactone  12.5 mg Oral Daily    Infusions: . sodium chloride    . sodium chloride    . cefTRIAXone (ROCEPHIN)  IV 1 g (09/08/17 0155)  . lactated ringers 10 mL/hr at 09/06/17 0532    PRN Medications: sodium chloride, sodium chloride, acetaminophen, clonazePAM, ipratropium-albuterol, ondansetron (ZOFRAN) IV, sodium chloride flush, sodium chloride flush    Patient Profile  66 y/o with presumed COPD with ongoing tobacco, IBD s/p previous partial colectomy admitted with acute respiratory failure. Parainfluenza and PCT +. Echo with EF 15% and critical low-gradient AS   Assessment/Plan   1. Acute hypoxic respiratory failure - likely multifactorial COPD, acute parainfluenzae infection  and acute HF - Improved with diuresis. - Extubated 10/29 - Resolved. Sats stable.  - Can likely stop steroids - will leave to discretion of primary team   2. Acute systolic HF - Echo EF 83%. RV normal. Likely critical low gradient AS but may also have component of ischemic CM  - Given normal RV function suspect severe LV dysfunction represents end-stage low-gradient AS +/- ischemic CM and not viral cardiomyopathy. - Volume status stable.  Arlyce Harman and dig added yesterday - No bblocker with bradycardia and acute decompensation - Given severe AS and low BP will not add ACE/ARB currently  3. Probable low-gradient critical aortic stenosis - TAVR team has seen CTs done. PFTs pending  4. AKI - Resolved   5. Elevated troponin - possible ischemia versus HF. No CP.  - continue ASA, statin. No b-blocker with acute HF & respiratory failure.    Can go home today. F/u in HF Clinic 7-10 days  Cardiac meds on d/c  Spiro 12.46m daily Digoxin 0.125 mcg daily Lasix 221mPRN only for increasing weight and swelling  Can stop ASA  Length of Stay: 7 BeGlori BickersMD  09/08/2017, 8:11 AM  Advanced  Heart Failure Team Pager [phone]-F; 7a - 4p)  Please contact [facility] for night-coverage after hours (4p -7a ) and weekends on [email or web address]

## 2017-09-08 NOTE — Progress Notes (Signed)
  [facility]   MULTIDISCIPLINARY HEART VALVE TEAM  Cardiac CTs and carotid dopplers completed yesterday. PFTs will be completed next week before appointment with Dr. Haroldine Laws. I have arranged for a new consult with Dr. Cyndia Bent on 11/21. The valve team will be following this patient closely.    Angelena Form PA-C  [facility]

## 2017-09-08 NOTE — Evaluation (Signed)
Occupational Therapy Evaluation and Discharge Patient Details Name: Kenneth Mills MRN: [id number] DOB: Jul 20, 1951 Today's Date: 09/08/2017    History of Present Illness 66 yo male with past Hx of COPD presented with increased shortness of breath, fever, cough and chills, adm 10/26; Patient found to have acute hypercapnic resp failure, CHF, and  + for viral pna;  required intubation 10/26--EXTubated 10/29   Clinical Impression   This 66 yo male admitted with above presents to acute OT with all education completed, we will D/C from acute OT. As in eval not he is a little off balance intermittently when up on his feet, but was able to self correct; feel the more he  is up his strength and balance will improve--no further OT needs.    Follow Up Recommendations  No OT follow up    Equipment Recommendations  None recommended by OT       Precautions / Restrictions Precautions Precautions: Fall Restrictions Weight Bearing Restrictions: No      Mobility Bed Mobility               General bed mobility comments: pt up in recliner upon arrival  Transfers Overall transfer level: Needs assistance   Transfers: Sit to/from Stand Sit to Stand: Supervision         General transfer comment: S for ambulation in room due to lines; in hallway pt with occassional sways of balance that he self corrected        ADL either performed or assessed with clinical judgement   ADL Overall ADL's : Modified independent                                       General ADL Comments: increased time due to a little off balance and pt reports he feels weak. Did recommend to pt that he be more cautious with showering due to his feeling a little weak and the fact that with soap and water this makes it more tenous for balance--he agreed.     Vision Patient Visual Report: No change from baseline              Pertinent Vitals/Pain Pain Assessment: No/denies pain     Hand  Dominance Right   Extremity/Trunk Assessment Upper Extremity Assessment Upper Extremity Assessment: Overall WFL for tasks assessed           Communication Communication Communication: No difficulties   Cognition Arousal/Alertness: Awake/alert Behavior During Therapy: WFL for tasks assessed/performed                                   General Comments: pt with decreased safety with telemetry lines with standing at sink              Home Living Family/patient expects to be discharged to:: Private residence Living Arrangements: Alone Available Help at Discharge: Family;Available PRN/intermittently Type of Home: House Home Access: Stairs to enter     Home Layout: One level     Bathroom Shower/Tub: Corporate investment banker: Standard     Home Equipment: None          Prior Functioning/Environment Level of Independence: Independent                 OT Problem List: Impaired balance (sitting and/or standing)  OT Goals(Current goals can be found in the care plan section) Acute Rehab OT Goals Patient Stated Goal: to go home  OT Frequency:                AM-PAC PT "6 Clicks" Daily Activity     Outcome Measure Help from another person eating meals?: None Help from another person taking care of personal grooming?: None Help from another person toileting, which includes using toliet, bedpan, or urinal?: None Help from another person bathing (including washing, rinsing, drying)?: None Help from another person to put on and taking off regular upper body clothing?: None Help from another person to put on and taking off regular lower body clothing?: None 6 Click Score: 24   End of Session Equipment Utilized During Treatment:  (none) Nurse Communication: Mobility status  Activity Tolerance: Patient tolerated treatment well Patient left: with call bell/phone within reach;in bed;with bed alarm set  OT Visit Diagnosis:  Unsteadiness on feet (R26.81)                Time: 9798-9211 OT Time Calculation (min): 28 min Charges:  OT General Charges $OT Visit: 1 Visit OT Evaluation $OT Eval Moderate Complexity: 1 Mod OT Treatments $Self Care/Home Management : 8-22 mins Golden Circle, OTR/L [phone] 09/08/2017

## 2017-09-08 NOTE — Progress Notes (Signed)
Discharged home accompanied by son, discharge instructions given to pt. Belongings taken home.

## 2017-09-08 NOTE — Care Management Note (Signed)
Case Management Note Previous Note Created by Tomi Bamberger  Patient Details  Name: Kenneth Mills MRN: [id number] Date of Birth: [birth year]  Subjective/Objective:  From home alone, pta indep, presents with Acute hypoxic resp failure, Acute Sys HF, aortic stenosis, AKI, Elevated trop.  Patient is off vent, he states he goes to [place] and he has medication coverage, his daughter , Barnetta Chapel lives right next to him and his son lives in [place] also and he checks on him all the time.                    Action/Plan: NCM will follow for dc needs.   Expected Discharge Date:                  Expected Discharge Plan:  Home/Self Care  In-House Referral:     Discharge planning Services  CM Consult  Post Acute Care Choice:    Choice offered to:     DME Arranged:    DME Agency:     HH Arranged:    HH Agency:     Status of Service:  In process, will continue to follow  If discussed at Long Length of Stay Meetings, dates discussed:    Additional Comments: CM provided choice for HH - pt chose [facility] - agency contacted and tentative referral accepted - CM requested order and face to face from attending. Maryclare Labrador, RN 09/08/2017, 11:59 AM

## 2017-09-09 NOTE — Discharge Summary (Signed)
Discharge Summary  Kenneth Mills:[id number] DOB: 05-31-1951  PCP: Patient, No Pcp Per  Admit date: 09/01/2017 Discharge date: 09/09/2017  Time spent: >30 mins  Recommendations for Outpatient Follow-up:  1. PCP 2. Cardiac team  Discharge Diagnoses:  Active Hospital Problems   Diagnosis Date Noted  . Respiratory failure ([place]) 09/01/2017  . Aortic stenosis, severe   . COPD (chronic obstructive pulmonary disease) ([place])   . IBD (inflammatory bowel disease)   . NICM (nonischemic cardiomyopathy) ([place])   . Tobacco abuse   . Anxiety   . LBBB (left bundle branch block)   . Acute systolic heart failure [facility])     Resolved Hospital Problems   Diagnosis Date Noted Date Resolved  No resolved problems to display.    Discharge Condition: Stable  Diet recommendation: Heart healthy  Vitals:   09/08/17 0914 09/08/17 1156  BP:  140/87  Pulse: 86   Resp:    Temp:  99 F (37.2 C)  SpO2:      History of present illness:  66 year old male with history of COPD, IBS, presented with shortness of breath for 2 weeks.  Patient was diagnosed with acute hypoxic respiratory failure secondary to viral pneumonia and also found to have acute systolic CHF likely secondary to severe aortic stenosis.  Patient was initially intubated on 10/26 and extubated on 10/29  Upon discharge, patient remained stable and denied any new complaints  Hospital Course:  Principal Problem:   Respiratory failure ([place]) Active Problems:   Aortic stenosis, severe   COPD (chronic obstructive pulmonary disease) ([facility])   IBD (inflammatory bowel disease)   NICM (nonischemic cardiomyopathy) ([facility])   Tobacco abuse   Anxiety   LBBB (left bundle branch block)   Acute systolic heart failure ([facility])  #Acute hypoxic respiratory failure Resolved Secondary to viral pneumonia with parainfluenza and bacterial superinfection Intubated on 10/26 and extubated on 10/29 History of COPD, wants to quit tobacco S/P IV ceftriaxone  for a total of 7 days D/C on tapered Prednisone  #Acute systolic CHF Secondary to severe aortic stenosis BNP 1167 Echo showed severe LV dysfunction in the setting of severe aortic stenosis Right and left heart cath done on 10/31 showed mild nonobstructive CAD with severe nonischemic cardiomyopathy Cardiology on board, workup in progress for possible TAVR as an outpatient  #Elevated troponins -Trended downwards, no ischemic changes on EKG Likely due to severe CHF  #AK I Resolved Likely due to ATN  #Thrombocytopenia Unknown etiology PCP to monitor  #Prediabetes A1c 6.2 Monitor blood sugars as patient is on steroid Lifestyle modification discussed  #Anxiety Continue clonazepam   Procedures:  Intubation as noted above  Consultations:  Cardiac team  Pulmonology  Discharge Exam: BP 140/87 (BP Location: Right Arm)   Pulse 86   Temp 99 F (37.2 C) (Axillary)   Resp (!) 23   Ht 5' 10"  (1.778 m)   Wt 70.7 kg (155 lb 13.8 oz)   SpO2 96%   BMI 22.36 kg/m   General: Alert, awake, oriented x3 Cardiovascular: S1-S2 present no added heart sounds Respiratory: Diminished breath sounds bilaterally, clear to auscultation  Discharge Instructions You were cared for by a hospitalist during your hospital stay. If you have any questions about your discharge medications or the care you received while you were in the hospital after you are discharged, you can call the unit and asked to speak with the hospitalist on call if the hospitalist that took care of you is not available. Once you are discharged,  your primary care physician will handle any further medical issues. Please note that NO REFILLS for any discharge medications will be authorized once you are discharged, as it is imperative that you return to your primary care physician (or establish a relationship with a primary care physician if you do not have one) for your aftercare needs so that they can reassess your need  for medications and monitor your lab values.  Discharge Instructions    Diet - low sodium heart healthy    Complete by:  As directed    Increase activity slowly    Complete by:  As directed      Allergies as of 09/08/2017   No Known Allergies     Medication List    STOP taking these medications   aspirin EC 325 MG tablet     TAKE these medications   atorvastatin 20 MG tablet Commonly known as:  LIPITOR Take 1 tablet (20 mg total) by mouth daily at 6 PM.   clonazePAM 0.5 MG tablet Commonly known as:  KLONOPIN Take 0.5 mg by mouth daily as needed for anxiety.   digoxin 0.125 MG tablet Commonly known as:  LANOXIN Take 1 tablet (0.125 mg total) by mouth daily.   predniSONE 10 MG tablet Commonly known as:  DELTASONE Take 1 tablet (10 mg total) by mouth daily.   spironolactone 25 MG tablet Commonly known as:  ALDACTONE Take 0.5 tablets (12.5 mg total) by mouth daily.      No Known Allergies Follow-up Information    Bensimhon, Shaune Pascal, MD Follow up on 09/14/2017.   Specialty:  Cardiology Why:  at 3:20  Contact information: [place] [address] [phone]        [facility]. Go on 10/09/2017.   Why:  @ 10:30am for a repeat ultrasound of your heart.  Contact information: [place] [address] [place]. Go on 09/14/2017.   Why:  @ 2pm. Please arrive to [place] in the [facility] (front entrance) at 1:45pm for you pulmonary function tests (breathing tests) Contact information: [place] [address] [phone]       Gaye Pollack, MD. Go on 09/27/2017.   Specialty:  Cardiothoracic Surgery Why:  @ 3pm. Please arrive at least 10 minutes early to your appointment.  Contact information: [address] [place]  [address] [phone]            The results of significant diagnostics from this hospitalization (including imaging, microbiology, ancillary and laboratory) are listed below for reference.    Significant Diagnostic Studies: Ct Coronary Morph W/cta Cor W/score W/ca W/cm &/or Wo/cm  Result Date: 09/08/2017 CLINICAL DATA:  66 year old male with severe aortic stenosis being evaluated for a TAVR procedure. EXAM: Cardiac TAVR CT TECHNIQUE: The patient was scanned on a [employer]. A 120 kV retrospective scan was triggered in the descending thoracic aorta at 111 HU's. Gantry rotation speed was 250 msecs and collimation was .6 mm. No beta blockade or nitro were given. The 3D data set was reconstructed in 5% intervals of the R-R cycle. Systolic and diastolic phases were analyzed on a dedicated work station using MPR, MIP and VRT modes. The patient received 80 cc of contrast. FINDINGS: Aortic Valve: Bicuspid with severely calcified leaflets and significant calcifications extending into the LVOT predominantly under  the non-coronary leaflet. Aorta:  Normal size, mild diffuse calcifications. Sinotubular Junction:  31 x 20 mm Ascending Thoracic Aorta:  36 x 36 mm Aortic Arch:  25 x 23 mm Descending Thoracic Aorta:  25 x 23 mm Sinus of Valsalva Measurements: 36 x 32 mm Coronary Artery Height above Annulus: Left Main:  16 mm Right Coronary:  14 mm Virtual Basal Annulus Measurements: Maximum/Minimum Diameter:  31.1 x 21.2 mm Perimeter:  82.7 mm Area:  501 mm2 Optimum Fluoroscopic Angle for Delivery:  LAO 2 CAU 1 IMPRESSION: 1. Bicuspid aortic valve with severely calcified leaflets and significant calcifications extending into the LVOT predominantly under the non-coronary leaflet. Annular measurements suitable for delivery of a 26 mm Edwards-SAPIEN 3 valve. 2.  Sufficient coronary to annulus distance. 3. Optimum Fluoroscopic Angle for Delivery:  LAO 2 CAU 1 4. No thrombus in the left atrial appendage.  Electronically Signed   By: Ena Dawley   On: 09/08/2017 15:56   Dg Chest Port 1 View  Result Date: 09/05/2017 CLINICAL DATA:  Shortness of Breath EXAM: PORTABLE CHEST 1 VIEW COMPARISON:  September 04, 2017 FINDINGS: Endotracheal tube and nasogastric tube have been removed. Central catheter tip is currently in the superior vena cava near the cavoatrial junction. No pneumothorax. There is interstitial prominence throughout the lungs, likely a degree of interstitial edema. There is no frank airspace consolidation currently. Heart is enlarged with mild pulmonary venous hypertension. There is aortic atherosclerosis. No bone lesions. IMPRESSION: Central catheter tip in superior vena cava near the cavoatrial junction. No pneumothorax. Interstitial edema present without alveolar consolidation. Stable underlying pulmonary vascular congestion. There is aortic atherosclerosis. Aortic Atherosclerosis (ICD10-I70.0). Electronically Signed   By: Lowella Grip III M.D.   On: 09/05/2017 08:53   Dg Chest Port 1 View  Result Date: 09/04/2017 CLINICAL DATA:  Respiratory failure, COPD, current smoker, intubated patient. EXAM: PORTABLE CHEST 1 VIEW COMPARISON:  Portable chest x-ray of September 03, 2017 FINDINGS: The lungs are well-expanded. The interstitial markings remain increased. The cardiac silhouette remains enlarged and the pulmonary vascularity mildly engorged. The retrocardiac region on the left remains dense. There is a small left pleural effusion. Slightly increased density is noted medially at the right lung base. There is no pneumothorax. There is calcification in the wall of the aortic arch. The endotracheal tube tip lies 3.5 cm above the carina. The esophagogastric tube tip lies in the gastric cardia. The proximal port lies at the GE junction. IMPRESSION: COPD with superimposed CHF. Left lower lobe atelectasis or pneumonia with possible early similar changes at the right base. Small left pleural effusion.  Advancement of the esophagogastric tube by 5-10 cm would assure that the proximal port is indeed below the GE junction. Thoracic aortic atheroscleroses. Electronically Signed   By: David  Martinique M.D.   On: 09/04/2017 07:28   Dg Chest Port 1 View  Result Date: 09/03/2017 CLINICAL DATA:  S/P PICC central line placement. Image reviewed by Dr. Chase Caller. Hx of COPD. Pt is a current smoker. EXAM: PORTABLE CHEST - 1 VIEW COMPARISON:  Earlier film of the same day FINDINGS: Left arm PICC line loops in the innominate vein. Endotracheal tube and nasogastric tube are stable in position. Heart size upper limits normal.  Atheromatous aorta. Some improvement in the perihilar and bibasilar interstitial and airspace edema or infiltrates. Right costophrenic angle is excluded. IMPRESSION: 1. Malpositioned PICC, looped in the left innominate vein. 2. Some interval improvement in bilateral edema or infiltrates. Electronically Signed   By: Keturah Barre  Vernard Gambles M.D.   On: 09/03/2017 13:55   Dg Chest Port 1 View  Result Date: 09/03/2017 CLINICAL DATA:  Acute respiratory failure. EXAM: PORTABLE CHEST 1 VIEW COMPARISON:  09/02/2017 FINDINGS: Endotracheal tube terminates just below the clavicular heads, well above the carina. Enteric tube courses into the left upper abdomen with tip not imaged. The cardiac silhouette remains enlarged. Bilateral interstitial and airspace opacities are greatest in the bases and have improved from the prior study. The right hemidiaphragm is now visible. No large pleural effusion or pneumothorax is identified. IMPRESSION: Decreased bilateral lung opacities which may indicate edema, though superimposed infection is also possible. Electronically Signed   By: Logan Bores M.D.   On: 09/03/2017 07:21   Dg Chest Port 1 View  Result Date: 09/02/2017 CLINICAL DATA:  66 year old male status post intubation. EXAM: PORTABLE CHEST 1 VIEW COMPARISON:  Chest radiograph dated 09/01/2017 FINDINGS: Endotracheal tube  approximately 3.5 cm above the carina in similar position as previously. An enteric tube has been placed extending into the left hemiabdomen with side-port distal to the GE junction and tip beyond the inferior margin of the image. There is extensive interstitial and alveolar opacities with greater involvement of the mid to lower lung fields. Overall the degree of airspace opacity has worsened compared the radiograph of 09/01/2017 at 21:50 o'clock. No pneumothorax. There is enlargement of the cardiopericardial silhouette. No acute osseous pathology. IMPRESSION: 1. Interval placement of an enteric tube coursing into the left hemiabdomen with side-port distal to the expected region of the GE junction. Endotracheal tube remains in similar position. 2. Diffuse interstitial and alveolar opacities with interval worsening compared to the earlier radiographs. 3. Enlarged cardiopericardial silhouette. Electronically Signed   By: Anner Crete M.D.   On: 09/02/2017 03:03   Dg Chest Port 1 View  Result Date: 09/01/2017 CLINICAL DATA:  Shortness of breath and fever EXAM: PORTABLE CHEST 1 VIEW COMPARISON:  None. FINDINGS: Cardiomegaly. Extensive diffuse interstitial and alveolar opacity right greater than left. No pleural effusion. No pneumothorax. IMPRESSION: 1. Extensive diffuse right greater than left interstitial and alveolar opacity which may reflect pulmonary edema, diffuse infection or combination of both 2. Cardiomegaly Electronically Signed   By: Donavan Foil M.D.   On: 09/01/2017 22:24   Dg Chest Port 1v Same Day  Result Date: 09/01/2017 CLINICAL DATA:  Post intubation EXAM: PORTABLE CHEST 1 VIEW COMPARISON:  09/01/2017 FINDINGS: Interval placement of endotracheal tube, the tip is about 3.4 cm superior to carina. Non inclusion of lung bases. Similar appearance of extensive interstitial and alveolar opacity. Cardiomegaly IMPRESSION: Endotracheal tube tip about 3.4 cm superior to carina. Similar appearance  of extensive interstitial and alveolar infiltrate or edema Electronically Signed   By: Donavan Foil M.D.   On: 09/01/2017 23:09   Dg Abd Portable 1v  Result Date: 09/02/2017 CLINICAL DATA:  Orogastric tube placement. EXAM: PORTABLE ABDOMEN - 1 VIEW COMPARISON:  None. FINDINGS: Limited abdominal radiograph for placement of nasogastric tube. Nasogastric tube tip projects and proximal stomach. Paucity of bowel gas. No mass effect or pathologic calcifications in the included abdomen. Cardiomegaly and interstitial prominence with pleural effusions and included chest. Soft tissue planes and included osseous structures are nonsuspicious. IMPRESSION: Nasogastric tube tip projects in proximal stomach. Electronically Signed   By: Elon Alas M.D.   On: 09/02/2017 03:03   Ir Fluoro Guide Cv Midline Picc Left  Result Date: 09/04/2017 INDICATION: Malpositioned PICC line placed by the IV team. Request is made for repositioning. EXAM: FLUOROSCOPIC GUIDED  PICC LINE EXCHANGE MEDICATIONS: None. CONTRAST:  None FLUOROSCOPY TIME:  2 minutes 24 seconds COMPLICATIONS: None immediate. TECHNIQUE: The procedure, risks, benefits, and alternatives were explained to the patient and informed written consent was obtained. The left upper extremity and external portion of the existing PICC line was prepped with chlorhexidine in a sterile fashion, and a sterile drape was applied covering the operative field. Maximum barrier sterile technique with sterile gowns and gloves were used for the procedure. A timeout was performed prior to the initiation of the procedure. The existing PICC line was cannulated with an 0.0018 wire which was advanced through the catheter. The catheter was exchanged for a peel-away sheath, ultimately allowing advancement of a 41 -cm, 5 - [place], triple lumen PICC line to the level of the superior caval atrial junction. A post procedure spot fluoroscopic image was obtained. The catheter easily aspirated and  flushed and was sutured in place. A dressing was placed. The patient tolerated the procedure well without immediate post procedural complication. FINDINGS: After catheter exchange, the tip lies within the superior cavoatrial junction. The catheter aspirates and flushes normally and is ready for immediate use. IMPRESSION: Successful fluoroscopic guided exchange of left upper extremity approach 41 cm, 5 - French, triple lumen PICC with tip overlying the superior caval atrial junction. The PICC line is ready for immediate use. Read by: Saverio Danker, PA-C Electronically Signed   By: Corrie Mckusick D.O.   On: 09/04/2017 14:35    Microbiology: Recent Results (from the past 240 hour(s))  Culture, blood (Routine X 2) w Reflex to ID Panel     Status: None   Collection Time: 09/01/17 10:00 PM  Result Value Ref Range Status   Specimen Description BLOOD RIGHT ANTECUBITAL  Final   Special Requests   Final    BOTTLES DRAWN AEROBIC AND ANAEROBIC Blood Culture results may not be optimal due to an inadequate volume of blood received in culture bottles   Culture   Final    NO GROWTH 5 DAYS Performed at [facility] [address], [place], [place] [address]    Report Status 09/07/2017 FINAL  Final  Culture, blood (Routine X 2) w Reflex to ID Panel     Status: None   Collection Time: 09/01/17 10:00 PM  Result Value Ref Range Status   Specimen Description BLOOD LEFT ANTECUBITAL  Final   Special Requests   Final    BOTTLES DRAWN AEROBIC AND ANAEROBIC Blood Culture results may not be optimal due to an inadequate volume of blood received in culture bottles   Culture   Final    NO GROWTH 5 DAYS Performed at [facility], [place] [address] [place], [place] [address]    Report Status 09/07/2017 FINAL  Final  Urine culture     Status: None   Collection Time: 09/01/17 11:50 PM  Result Value Ref Range Status   Specimen Description URINE, CATHETERIZED  Final   Special Requests NONE  Final   Culture    Final    NO GROWTH Performed at [facility], [place] [address] [place], [place] [address]    Report Status 09/03/2017 FINAL  Final  Respiratory Panel by PCR     Status: Abnormal   Collection Time: 09/02/17  2:25 AM  Result Value Ref Range Status   Adenovirus NOT DETECTED NOT DETECTED Final   Coronavirus 229E NOT DETECTED NOT DETECTED Final   Coronavirus HKU1 NOT DETECTED NOT DETECTED Final   Coronavirus  NL63 NOT DETECTED NOT DETECTED Final   Coronavirus OC43 NOT DETECTED NOT DETECTED Final   Metapneumovirus NOT DETECTED NOT DETECTED Final   Rhinovirus / Enterovirus NOT DETECTED NOT DETECTED Final   Influenza A NOT DETECTED NOT DETECTED Final   Influenza B NOT DETECTED NOT DETECTED Final   Parainfluenza Virus 1 NOT DETECTED NOT DETECTED Final   Parainfluenza Virus 2 DETECTED (A) NOT DETECTED Final   Parainfluenza Virus 3 NOT DETECTED NOT DETECTED Final   Parainfluenza Virus 4 NOT DETECTED NOT DETECTED Final   Respiratory Syncytial Virus NOT DETECTED NOT DETECTED Final   Bordetella pertussis NOT DETECTED NOT DETECTED Final   Chlamydophila pneumoniae NOT DETECTED NOT DETECTED Final   Mycoplasma pneumoniae NOT DETECTED NOT DETECTED Final  MRSA PCR Screening     Status: None   Collection Time: 09/02/17  2:25 AM  Result Value Ref Range Status   MRSA by PCR NEGATIVE NEGATIVE Final    Comment:        The GeneXpert MRSA Assay (FDA approved for NASAL specimens only), is one component of a comprehensive MRSA colonization surveillance program. It is not intended to diagnose MRSA infection nor to guide or monitor treatment for MRSA infections.   Culture, respiratory (tracheal aspirate)     Status: None   Collection Time: 09/02/17  4:03 AM  Result Value Ref Range Status   Specimen Description TRACHEAL ASPIRATE  Final   Special Requests NONE  Final   Gram Stain   Final    MODERATE WBC PRESENT, PREDOMINANTLY PMN FEW GRAM NEGATIVE RODS FEW GRAM POSITIVE COCCI IN PAIRS    Culture  Consistent with normal respiratory flora.  Final   Report Status 09/04/2017 FINAL  Final     Labs: Basic Metabolic Panel:  Recent Labs Lab 09/03/17 0139 09/04/17 0511 09/05/17 0343 09/06/17 0435 09/07/17 0335 09/08/17 0648  NA 139 141 140 141 140 140  K 3.8 4.3 4.5 4.1 3.7 3.5  CL 108 103 99* 102 104 105  CO2 22 30 32 30 29 27   GLUCOSE 217* 197* 135* 114* 95 153*  BUN 19 29* 32* 34* 34* 31*  CREATININE 1.07 0.91 0.89 0.94 0.80 0.98  CALCIUM 8.1* 8.5* 8.8* 8.9 8.7* 8.9  MG 2.2 2.1  --   --   --   --   PHOS 2.3* 3.1  --   --   --   --    Liver Function Tests:  Recent Labs Lab 09/03/17 0139 09/05/17 0343  AST 60* 42*  ALT 58 40  ALKPHOS 78 82  BILITOT 0.8 0.9  PROT 5.3* 5.9*  ALBUMIN 2.8* 3.1*   No results for input(s): LIPASE, AMYLASE in the last 168 hours. No results for input(s): AMMONIA in the last 168 hours. CBC:  Recent Labs Lab 09/04/17 0511 09/05/17 0343 09/06/17 0435 09/07/17 0335 09/08/17 0648  WBC 14.4* 13.0* 12.7* 9.0 9.1  NEUTROABS  --   --   --  5.8 5.8  HGB 15.0 16.5 17.1* 17.1* 17.3*  HCT 47.1 48.7 51.3 49.9 51.6  MCV 97.5 95.7 95.2 94.0 93.3  PLT 99* 100* 103* 107* 122*   Cardiac Enzymes:  Recent Labs Lab 09/03/17 0139  TROPONINI 1.50*   BNP: BNP (last 3 results)  Recent Labs  09/01/17 2155 09/03/17 0139  BNP 885.5* 1,167.7*    ProBNP (last 3 results) No results for input(s): PROBNP in the last 8760 hours.  CBG:  Recent Labs Lab 09/07/17 1127 09/07/17 1719 09/07/17 2030 09/08/17 0809  09/08/17 1212  [place]       Signed:  Alma Friendly, MD [facility] 09/09/2017, 7:33 PM

## 2017-09-12 DIAGNOSIS — J440 Chronic obstructive pulmonary disease with acute lower respiratory infection: Secondary | ICD-10-CM | POA: Diagnosis not present

## 2017-09-12 DIAGNOSIS — F419 Anxiety disorder, unspecified: Secondary | ICD-10-CM | POA: Diagnosis not present

## 2017-09-12 DIAGNOSIS — I5021 Acute systolic (congestive) heart failure: Secondary | ICD-10-CM | POA: Diagnosis not present

## 2017-09-12 DIAGNOSIS — J441 Chronic obstructive pulmonary disease with (acute) exacerbation: Secondary | ICD-10-CM | POA: Diagnosis not present

## 2017-09-12 DIAGNOSIS — J189 Pneumonia, unspecified organism: Secondary | ICD-10-CM | POA: Diagnosis not present

## 2017-09-12 DIAGNOSIS — I428 Other cardiomyopathies: Secondary | ICD-10-CM | POA: Diagnosis not present

## 2017-09-12 DIAGNOSIS — I350 Nonrheumatic aortic (valve) stenosis: Secondary | ICD-10-CM | POA: Diagnosis not present

## 2017-09-14 ENCOUNTER — Ambulatory Visit (HOSPITAL_BASED_OUTPATIENT_CLINIC_OR_DEPARTMENT_OTHER)
Admission: RE | Admit: 2017-09-14 | Discharge: 2017-09-14 | Disposition: A | Discharge status: Home / Self Care | Payer: Medicare HMO | Source: Ambulatory Visit | Attending: Internal Medicine | Admitting: Internal Medicine

## 2017-09-14 ENCOUNTER — Encounter (HOSPITAL_COMMUNITY): Payer: Self-pay | Admitting: Internal Medicine

## 2017-09-14 ENCOUNTER — Encounter (HOSPITAL_COMMUNITY): Payer: Medicare Other | Admitting: Internal Medicine

## 2017-09-14 ENCOUNTER — Ambulatory Visit (HOSPITAL_COMMUNITY)
Admit: 2017-09-14 | Discharge: 2017-09-14 | Disposition: A | Discharge status: Home / Self Care | Payer: Medicare HMO | Attending: Physician Assistant | Admitting: Physician Assistant

## 2017-09-14 ENCOUNTER — Other Ambulatory Visit: Payer: Self-pay

## 2017-09-14 VITALS — BP 110/78 | HR 87 | Wt 160.4 lb

## 2017-09-14 DIAGNOSIS — R918 Other nonspecific abnormal finding of lung field: Secondary | ICD-10-CM | POA: Diagnosis not present

## 2017-09-14 DIAGNOSIS — F419 Anxiety disorder, unspecified: Secondary | ICD-10-CM | POA: Diagnosis not present

## 2017-09-14 DIAGNOSIS — J449 Chronic obstructive pulmonary disease, unspecified: Secondary | ICD-10-CM | POA: Insufficient documentation

## 2017-09-14 DIAGNOSIS — I350 Nonrheumatic aortic (valve) stenosis: Secondary | ICD-10-CM | POA: Insufficient documentation

## 2017-09-14 DIAGNOSIS — F17210 Nicotine dependence, cigarettes, uncomplicated: Secondary | ICD-10-CM | POA: Diagnosis not present

## 2017-09-14 DIAGNOSIS — J9600 Acute respiratory failure, unspecified whether with hypoxia or hypercapnia: Secondary | ICD-10-CM | POA: Insufficient documentation

## 2017-09-14 DIAGNOSIS — M7989 Other specified soft tissue disorders: Secondary | ICD-10-CM | POA: Insufficient documentation

## 2017-09-14 DIAGNOSIS — Z9049 Acquired absence of other specified parts of digestive tract: Secondary | ICD-10-CM | POA: Insufficient documentation

## 2017-09-14 DIAGNOSIS — K589 Irritable bowel syndrome without diarrhea: Secondary | ICD-10-CM | POA: Insufficient documentation

## 2017-09-14 DIAGNOSIS — I429 Cardiomyopathy, unspecified: Secondary | ICD-10-CM | POA: Insufficient documentation

## 2017-09-14 DIAGNOSIS — Z79899 Other long term (current) drug therapy: Secondary | ICD-10-CM | POA: Insufficient documentation

## 2017-09-14 DIAGNOSIS — I5022 Chronic systolic (congestive) heart failure: Secondary | ICD-10-CM

## 2017-09-14 DIAGNOSIS — I2510 Atherosclerotic heart disease of native coronary artery without angina pectoris: Secondary | ICD-10-CM | POA: Insufficient documentation

## 2017-09-14 LAB — PULMONARY FUNCTION TEST
DL/VA % pred: 58 %
DL/VA: 2.68 ml/min/mmHg/L
DLCO cor % pred: 49 %
DLCO cor: 16.15 ml/min/mmHg
DLCO unc % pred: 53 %
DLCO unc: 17.26 ml/min/mmHg
FEF 25-75 Post: 1.07 L/s
FEF 25-75 Pre: 1.18 L/s
FEF2575-%Change-Post: -8 %
FEF2575-%Pred-Post: 40 %
FEF2575-%Pred-Pre: 44 %
FEV1-%Change-Post: -2 %
FEV1-%Pred-Post: 72 %
FEV1-%Pred-Pre: 75 %
FEV1-Post: 2.46 L
FEV1-Pre: 2.53 L
FEV1FVC-%Change-Post: 4 %
FEV1FVC-%Pred-Pre: 82 %
FEV6-%Change-Post: -4 %
FEV6-%Pred-Post: 88 %
FEV6-%Pred-Pre: 92 %
FEV6-Post: 3.82 L
FEV6-Pre: 4 L
FEV6FVC-%Change-Post: 3 %
FEV6FVC-%Pred-Post: 104 %
FEV6FVC-%Pred-Pre: 101 %
FVC-%Change-Post: -7 %
FVC-%Pred-Post: 84 %
FVC-%Pred-Pre: 91 %
FVC-Post: 3.85 L
FVC-Pre: 4.16 L
Post FEV1/FVC ratio: 64 %
Post FEV6/FVC ratio: 99 %
Pre FEV1/FVC ratio: 61 %
Pre FEV6/FVC Ratio: 96 %
RV % pred: 82 %
RV: 1.97 L
TLC % pred: 88 %
TLC: 6.19 L

## 2017-09-14 LAB — BASIC METABOLIC PANEL
ANION GAP: 8 (ref 5–15)
BUN: 17 mg/dL (ref 6–20)
CHLORIDE: 105 mmol/L (ref 101–111)
CO2: 25 mmol/L (ref 22–32)
Calcium: 9.2 mg/dL (ref 8.9–10.3)
Creatinine, Ser: 1.06 mg/dL (ref 0.61–1.24)
Glucose, Bld: 91 mg/dL (ref 65–99)
POTASSIUM: 3.9 mmol/L (ref 3.5–5.1)
SODIUM: 138 mmol/L (ref 135–145)

## 2017-09-14 MED ORDER — ALBUTEROL SULFATE (2.5 MG/3ML) 0.083% IN NEBU
2.5000 mg | INHALATION_SOLUTION | Freq: Once | RESPIRATORY_TRACT | Status: AC
  Administered 2017-09-14: 2.5 mg via RESPIRATORY_TRACT

## 2017-09-14 NOTE — Patient Instructions (Signed)
Lab today  Your physician recommends that you schedule a follow-up appointment in: 2 month

## 2017-09-14 NOTE — Progress Notes (Signed)
Advanced Heart Failure Clinic Note   Primary Cardiologist: Dr. Haroldine Mills   HPI:  Kenneth Mills is a 66 y.o. male with h/o COPD, IBD s/p partial colectomy, Systolic CHF, and severe aortic stenosis.  Admitted 09/01/17 with acute respiratory failure with parainfluenza and + PCT requiring intubation. Echo showed markedly depressed LV dysfunction and critical low-grade AS (new findings).   Cath showed non-obstructive disease, with marginal cardiac output, and severe AS.    TAVR team consulted and work up began. Pt had cardiac CTs and Carotid dopplers inpatient. PFTs and outpatient surgery follow up arranged.   He presents today for post hospital follow up with his daughter. Feels good. Not doing much but can do all ADLs without any dyspnea. No CP. No syncope. No edema, orthopnea or PND. Taking meds as directed. Feels like he is getting back to his old self. Weight stable. Smoking 1-2 cigarettes per day.   PFTs 09/14/17 FVC-pre 4.13     (91%) FVC-post 3.85   (84%) FEV1-pre 2.53   (75%) FEV1-post 2.46 (72%) TLC 6.19 (88%) DLCO 17.26 (53%) (uncorrected)  Echo 09/02/17: EF 15% diffuse HK Normal RV. Critical low-gradient AS  RHC/LHC 09/05/2017  Prox RCA lesion, 50 %stenosed.  Mid RCA lesion, 30 %stenosed.  Mid LAD lesion, 30 %stenosed.  Mid Cx to Dist Cx lesion, 20 %stenosed.  Findings:  Ao = 96/67 (81) LV 140/19 RA = 4 RV = 31/7 PA = 28/13 (21) PCW = 14 Fick cardiac output/index =3.7/1.9 PVR = 1.9 WU Ao sat = 97% PA sat = 69%, 65%  Aortic valve Peak gradient 57mHG Mean gradient 24 mmHG  Past Medical History:  Diagnosis Date  . Anxiety   . Aortic stenosis, severe    a. suspect low flow, low gradient severe AS.   .[place]D (ch[place] obstructive [place] disease[place]   . IBD (inflammatory bowel disease)    a. s/p partial colectomy in 2009  . LBBB (left bundle branch block)   . N[place]ischemic [place]    a. 08/2014: EF 15% suspect to be endstage  CM 2/2 severe AS  . Tobacco abuse    Current Outpatient Medications  Medication Sig Dispense Refill  . atorvastatin (LIPITOR) 20 MG tablet Take 1 tablet (20 mg total) by mouth daily at 6 PM. 30 tablet 0  . clonazePAM (KLONOPIN) 0.5 MG tablet Take 0.5 mg by mouth daily as needed for anxiety.     . digoxin (LANOXIN) 0.125 MG tablet Take 1 tablet (0.125 mg total) by mouth daily. 30 tablet 0  . spironolactone (ALDACTONE) 25 MG tablet Take 0.5 tablets (12.5 mg total) by mouth daily. 30 tablet 0   No current facility-administered medications for this encounter.    No Known Allergies  Social History   Socioeconomic History  . Marital status: Married    Spouse name: Not on file  . Number of children: Not on file  . Years of education: Not on file  . Highest education level: Not on file  Social Needs  . Financial resource strain: Not on file  . Food insecurity - worry: Not on file  . Food insecurity - inability: Not on file  . Transportation needs - medical: Not on file  . Transportation needs - non-medical: Not on file  Occupational History  . Not on file  Tobacco Use  . Smoking status: Current Every Day Smoker    Types: Cigarettes  . Smokeless tobacco: Never Used  Substance and Sexual Activity  . Alcohol use:  Yes    Comment: rarely  . Drug use: No  . Sexual activity: Not on file  Other Topics Concern  . Not on file  Social History Narrative  . Not on file   Family History  Problem Relation Age of Onset  . Hypertension Father   . Heart attack Father   . Lung cancer Brother    Vitals:   09/14/17 1520 09/14/17 1524  BP:  110/78  Pulse:  87  SpO2:  98%  Weight: 160 lb 6.4 oz (72.8 kg) 160 lb 6.4 oz (72.8 kg)   Wt Readings from Last 3 Encounters:  09/14/17 160 lb 6.4 oz (72.8 kg)  09/08/17 155 lb 13.8 oz (70.7 kg)    PHYSICAL EXAM: General:  Well appearing. No respiratory difficulty HEENT: normal Neck: supple. no JVD. Carotids 2+ bilat; no bruits. No  lymphadenopathy or thyromegaly appreciated. Cor: PMI nondisplaced. Distant HS. Regular rate & rhythm. AS murmur not well heard.  Lungs: Clear with decreased BS Abdomen: soft, nontender, nondistended. No hepatosplenomegaly. No bruits or masses. Good bowel sounds. Extremities: no cyanosis, clubbing, rash, edema Neuro: alert & oriented x 3, cranial nerves grossly intact. moves all 4 extremities w/o difficulty. Affect pleasant.  ASSESSMENT & PLAN:   1. Chronic systolic CHF - Echo 50/35/46 LVEF 15%, RV normal, critical low gradient AS - NYHA II-III - Volume status looks good - Continue lasix 20 mg as needed for weight and swelling - Continue digoxin 0.125 mg daily. Check level today.  - Continue spironolactone 12.5 mg daily. BMET today.  - Reinforced fluid restriction to < 2 L daily, sodium restriction to less than 2000 mg daily, and the importance of daily weights.    2. COPD - PFTS today better than expected with tobacco use.   - DLCO reduced.   3. Low-gradient critical aortic stenosis - TAVR team has consulted and following.  - Sees Dr. Cyndia Mills 09/27/17 - Repeat Echo planned for 10/09/17 - Extensive CTAs performed 09/07/17 with severely thickened and calcified AV. Vasculature as in chart.   4. Tobacco abuse - Stressed need for cessation.   5. CAD - Mild non-obstructive CAD with 50% proximal RCA with spasm on cath 09/06/17 - Continue atorvastatin 20 mg daily.   6. Pulmonary nodules.  - Scattered pulmonary nodules noted on CT 09/07/17 with repeat recommended in 3-6 months.   Kenneth Friar, PA-C 09/14/17   Patient seen and examined with the above-signed Advanced Practice Provider and/or Housestaff. I personally reviewed laboratory data, imaging studies and relevant notes. I independently examined the patient and formulated the important aspects of the plan. I have edited the note to reflect any of my changes or salient points. I have personally discussed the plan with the  patient and/or family.  He is much improved. NYHA II. Volume status looks good. BP improved. PFTs reviewed personally and better than anticipated. Will continue current therapy. Would not add b-blocker yet. Will need AoV replaced. Major question is if cardiomyopathy is related to AS or ]possible overlying viral CM. Will continue current therapy. Reassess echo one month. If EF recovers may be SAVR candidate versus TAVR depending on overall operative risk. HAs f/u scheduled with Dr. Cyndia Mills.   Glori Bickers, MD  10:46 PM

## 2017-09-15 DIAGNOSIS — F419 Anxiety disorder, unspecified: Secondary | ICD-10-CM | POA: Diagnosis not present

## 2017-09-15 DIAGNOSIS — I5021 Acute systolic (congestive) heart failure: Secondary | ICD-10-CM | POA: Diagnosis not present

## 2017-09-15 DIAGNOSIS — J441 Chronic obstructive pulmonary disease with (acute) exacerbation: Secondary | ICD-10-CM | POA: Diagnosis not present

## 2017-09-15 DIAGNOSIS — J189 Pneumonia, unspecified organism: Secondary | ICD-10-CM | POA: Diagnosis not present

## 2017-09-15 DIAGNOSIS — I350 Nonrheumatic aortic (valve) stenosis: Secondary | ICD-10-CM | POA: Diagnosis not present

## 2017-09-15 DIAGNOSIS — J440 Chronic obstructive pulmonary disease with acute lower respiratory infection: Secondary | ICD-10-CM | POA: Diagnosis not present

## 2017-09-15 DIAGNOSIS — I428 Other cardiomyopathies: Secondary | ICD-10-CM | POA: Diagnosis not present

## 2017-09-19 DIAGNOSIS — J441 Chronic obstructive pulmonary disease with (acute) exacerbation: Secondary | ICD-10-CM | POA: Diagnosis not present

## 2017-09-19 DIAGNOSIS — J189 Pneumonia, unspecified organism: Secondary | ICD-10-CM | POA: Diagnosis not present

## 2017-09-19 DIAGNOSIS — I350 Nonrheumatic aortic (valve) stenosis: Secondary | ICD-10-CM | POA: Diagnosis not present

## 2017-09-19 DIAGNOSIS — I5021 Acute systolic (congestive) heart failure: Secondary | ICD-10-CM | POA: Diagnosis not present

## 2017-09-19 DIAGNOSIS — I428 Other cardiomyopathies: Secondary | ICD-10-CM | POA: Diagnosis not present

## 2017-09-19 DIAGNOSIS — J440 Chronic obstructive pulmonary disease with acute lower respiratory infection: Secondary | ICD-10-CM | POA: Diagnosis not present

## 2017-09-19 DIAGNOSIS — F419 Anxiety disorder, unspecified: Secondary | ICD-10-CM | POA: Diagnosis not present

## 2017-09-20 DIAGNOSIS — I5021 Acute systolic (congestive) heart failure: Secondary | ICD-10-CM | POA: Diagnosis not present

## 2017-09-20 DIAGNOSIS — Z720 Tobacco use: Secondary | ICD-10-CM | POA: Diagnosis not present

## 2017-09-20 DIAGNOSIS — J9601 Acute respiratory failure with hypoxia: Secondary | ICD-10-CM | POA: Diagnosis not present

## 2017-09-20 DIAGNOSIS — J449 Chronic obstructive pulmonary disease, unspecified: Secondary | ICD-10-CM | POA: Diagnosis not present

## 2017-09-20 DIAGNOSIS — I350 Nonrheumatic aortic (valve) stenosis: Secondary | ICD-10-CM | POA: Diagnosis not present

## 2017-09-21 DIAGNOSIS — I350 Nonrheumatic aortic (valve) stenosis: Secondary | ICD-10-CM | POA: Diagnosis not present

## 2017-09-21 DIAGNOSIS — I5021 Acute systolic (congestive) heart failure: Secondary | ICD-10-CM | POA: Diagnosis not present

## 2017-09-21 DIAGNOSIS — J440 Chronic obstructive pulmonary disease with acute lower respiratory infection: Secondary | ICD-10-CM | POA: Diagnosis not present

## 2017-09-21 DIAGNOSIS — J441 Chronic obstructive pulmonary disease with (acute) exacerbation: Secondary | ICD-10-CM | POA: Diagnosis not present

## 2017-09-21 DIAGNOSIS — I428 Other cardiomyopathies: Secondary | ICD-10-CM | POA: Diagnosis not present

## 2017-09-21 DIAGNOSIS — F419 Anxiety disorder, unspecified: Secondary | ICD-10-CM | POA: Diagnosis not present

## 2017-09-21 DIAGNOSIS — J189 Pneumonia, unspecified organism: Secondary | ICD-10-CM | POA: Diagnosis not present

## 2017-09-25 DIAGNOSIS — J441 Chronic obstructive pulmonary disease with (acute) exacerbation: Secondary | ICD-10-CM | POA: Diagnosis not present

## 2017-09-25 DIAGNOSIS — J440 Chronic obstructive pulmonary disease with acute lower respiratory infection: Secondary | ICD-10-CM | POA: Diagnosis not present

## 2017-09-25 DIAGNOSIS — I5021 Acute systolic (congestive) heart failure: Secondary | ICD-10-CM | POA: Diagnosis not present

## 2017-09-25 DIAGNOSIS — F419 Anxiety disorder, unspecified: Secondary | ICD-10-CM | POA: Diagnosis not present

## 2017-09-25 DIAGNOSIS — I428 Other cardiomyopathies: Secondary | ICD-10-CM | POA: Diagnosis not present

## 2017-09-25 DIAGNOSIS — I350 Nonrheumatic aortic (valve) stenosis: Secondary | ICD-10-CM | POA: Diagnosis not present

## 2017-09-25 DIAGNOSIS — J189 Pneumonia, unspecified organism: Secondary | ICD-10-CM | POA: Diagnosis not present

## 2017-09-27 ENCOUNTER — Encounter: Payer: Medicare Other | Admitting: Surgery

## 2017-09-27 DIAGNOSIS — I350 Nonrheumatic aortic (valve) stenosis: Secondary | ICD-10-CM | POA: Diagnosis not present

## 2017-09-27 DIAGNOSIS — J441 Chronic obstructive pulmonary disease with (acute) exacerbation: Secondary | ICD-10-CM | POA: Diagnosis not present

## 2017-09-27 DIAGNOSIS — I5021 Acute systolic (congestive) heart failure: Secondary | ICD-10-CM | POA: Diagnosis not present

## 2017-09-27 DIAGNOSIS — J440 Chronic obstructive pulmonary disease with acute lower respiratory infection: Secondary | ICD-10-CM | POA: Diagnosis not present

## 2017-09-27 DIAGNOSIS — I428 Other cardiomyopathies: Secondary | ICD-10-CM | POA: Diagnosis not present

## 2017-09-27 DIAGNOSIS — J189 Pneumonia, unspecified organism: Secondary | ICD-10-CM | POA: Diagnosis not present

## 2017-09-27 DIAGNOSIS — F419 Anxiety disorder, unspecified: Secondary | ICD-10-CM | POA: Diagnosis not present

## 2017-10-03 ENCOUNTER — Encounter: Payer: Self-pay | Admitting: Surgery

## 2017-10-03 ENCOUNTER — Other Ambulatory Visit: Payer: Self-pay

## 2017-10-03 ENCOUNTER — Institutional Professional Consult (permissible substitution): Payer: Medicare HMO | Admitting: Surgery

## 2017-10-03 VITALS — BP 113/78 | HR 83 | Ht 70.0 in | Wt 162.0 lb

## 2017-10-03 DIAGNOSIS — I428 Other cardiomyopathies: Secondary | ICD-10-CM | POA: Diagnosis not present

## 2017-10-03 DIAGNOSIS — J440 Chronic obstructive pulmonary disease with acute lower respiratory infection: Secondary | ICD-10-CM | POA: Diagnosis not present

## 2017-10-03 DIAGNOSIS — I350 Nonrheumatic aortic (valve) stenosis: Secondary | ICD-10-CM

## 2017-10-03 DIAGNOSIS — J441 Chronic obstructive pulmonary disease with (acute) exacerbation: Secondary | ICD-10-CM | POA: Diagnosis not present

## 2017-10-03 DIAGNOSIS — I5021 Acute systolic (congestive) heart failure: Secondary | ICD-10-CM | POA: Diagnosis not present

## 2017-10-03 DIAGNOSIS — F419 Anxiety disorder, unspecified: Secondary | ICD-10-CM | POA: Diagnosis not present

## 2017-10-03 DIAGNOSIS — J189 Pneumonia, unspecified organism: Secondary | ICD-10-CM | POA: Diagnosis not present

## 2017-10-04 DIAGNOSIS — I5021 Acute systolic (congestive) heart failure: Secondary | ICD-10-CM | POA: Diagnosis not present

## 2017-10-04 DIAGNOSIS — J440 Chronic obstructive pulmonary disease with acute lower respiratory infection: Secondary | ICD-10-CM | POA: Diagnosis not present

## 2017-10-04 DIAGNOSIS — J189 Pneumonia, unspecified organism: Secondary | ICD-10-CM | POA: Diagnosis not present

## 2017-10-04 DIAGNOSIS — I350 Nonrheumatic aortic (valve) stenosis: Secondary | ICD-10-CM | POA: Diagnosis not present

## 2017-10-04 DIAGNOSIS — I428 Other cardiomyopathies: Secondary | ICD-10-CM | POA: Diagnosis not present

## 2017-10-04 DIAGNOSIS — F419 Anxiety disorder, unspecified: Secondary | ICD-10-CM | POA: Diagnosis not present

## 2017-10-04 DIAGNOSIS — J441 Chronic obstructive pulmonary disease with (acute) exacerbation: Secondary | ICD-10-CM | POA: Diagnosis not present

## 2017-10-05 ENCOUNTER — Other Ambulatory Visit: Payer: Self-pay | Admitting: Internal Medicine

## 2017-10-05 MED ORDER — DIGOXIN 125 MCG PO TABS: 0.1250 mg | ORAL_TABLET | Freq: Every day | ORAL | 2 refills | Status: DC

## 2017-10-05 MED ORDER — ATORVASTATIN CALCIUM 20 MG PO TABS: 20.0000 mg | ORAL_TABLET | Freq: Every day | ORAL | 2 refills | Status: DC

## 2017-10-05 NOTE — Telephone Encounter (Signed)
Pt calling requesting a refill on atorvastatin 20 mg tablet and digoxin .125 mg tablet, sent to [facility] in [place]. Pt would like a call back when this is done. This is a CHF pt. Please address

## 2017-10-05 NOTE — Telephone Encounter (Signed)
Detailed message left with information regarding refills

## 2017-10-07 ENCOUNTER — Encounter: Payer: Self-pay | Admitting: Surgery

## 2017-10-07 NOTE — Progress Notes (Signed)
Patient ID: Kenneth Mills, male   DOB: [birth year], 66 y.o.   MRN: [id number]  [facility]SULTATION REPORT  Referring Provider is Bensimhon, Shaune Pascal, MD PCP is Starlyn Skeans, PA-C  Chief Complaint  Patient presents with  . New Patient (Initial Visit)    AS evaluation, TAVR vs SAVR    HPI:  The patient is a 66 year old gentleman who is an active 1 ppd smoker with COPD, IBD s/p partial colectomy who was admitted on 09/01/2017 with acute respiratory failure with parainfluenza requiring intubation. An echo showed a poor EF of 15% with severe LV dilation and diffuse hypokinesis. There was severe AS with a trileaflet severely calcified and immobile valve with a DI of 0.19 and a valve area of 0.67 cm2 with a mean gradient of only 25 mm Hg. He underwent cardiac cath on 10/31 showing a 50% proximal RCA stenosis and mild disease in the LAD and LCX. His right heart pressures were normal with a PA sat of 69%. The mean gradient was 24 mm Hg and the peak 34 mm Hg. CI was 1.9. He was seen by Dr. Burt Knack and work up for TAVR was started. Since discharge he has returned to smoking. He says that he feels fairly well and can do his normal daily activities without shortness of breath. He has no orthopnea or PND.   Past Medical History:  Diagnosis Date  . Anxiety   . Aortic stenosis, severe    a. suspect low flow, low gradient severe AS.   Marland Kitchen COPD (chronic obstructive pulmonary disease) ([place])   . IBD (inflammatory bowel disease)    a. s/p partial colectomy in 2009  . LBBB (left bundle branch block)   . NICM (nonischemic cardiomyopathy) ([place])    a. 08/2014: EF 15% suspect to be endstage CM 2/2 severe AS  . Tobacco abuse     Past Surgical History:  Procedure Laterality Date  . COLON SURGERY    . HERNIA REPAIR    . IR FLUORO GUIDE CV MIDLINE PICC LEFT  09/04/2017  . RIGHT/LEFT HEART CATH AND CORONARY ANGIOGRAPHY N/A  09/06/2017   Procedure: RIGHT/LEFT HEART CATH AND CORONARY ANGIOGRAPHY;  Surgeon: Jolaine Artist, MD;  Location: [facility];  Service: Cardiovascular;  Laterality: N/A;    Family History  Problem Relation Age of Onset  . Hypertension Father   . Heart attack Father   . Lung cancer Brother     Social History   Socioeconomic History  . Marital status: Married    Spouse name: Not on file  . Number of children: Not on file  . Years of education: Not on file  . Highest education level: Not on file  Social Needs  . Financial resource strain: Not on file  . Food insecurity - worry: Not on file  . Food insecurity - inability: Not on file  . Transportation needs - medical: Not on file  . Transportation needs - non-medical: Not on file  Occupational History  . Not on file  Tobacco Use  . Smoking status: Current Every Day Smoker    Packs/day: 2.00    Types: Cigarettes    Start date: 11  . Smokeless tobacco: Former Network engineer and Sexual Activity  . Alcohol use: Yes    Comment: rarely  . Drug use: No  . Sexual activity: Not on file  Other Topics Concern  . Not on file  Social History Narrative  . Not on file    Current Outpatient Medications  Medication Sig Dispense Refill  . clonazePAM (KLONOPIN) 0.5 MG tablet Take 0.5 mg by mouth daily as needed for anxiety.     Marland Kitchen spironolactone (ALDACTONE) 25 MG tablet Take 0.5 tablets (12.5 mg total) by mouth daily. 30 tablet 0  . atorvastatin (LIPITOR) 20 MG tablet Take 1 tablet (20 mg total) by mouth daily at 6 PM. 30 tablet 2  . digoxin (LANOXIN) 0.125 MG tablet Take 1 tablet (0.125 mg total) by mouth daily. 30 tablet 2   No current facility-administered medications for this visit.     No Known Allergies    Review of Systems:   General:  reduced appetite, decreased energy, no weight gain, has weight loss, no fever  Cardiac:  has chest pain with exertion, no chest pain at rest, has SOB with moderate exertion,  no resting SOB, no PND, no orthopnea, no palpitations, no arrhythmia, no atrial fibrillation, no LE edema, no dizzy spells, no syncope  Respiratory:  exertional shortness of breath, no home oxygen, has productive cough, has dry cough, no bronchitis, no wheezing, no hemoptysis, no asthma, no pain with inspiration or cough, no sleep apnea, no CPAP at night  GI:   no difficulty swallowing, no reflux, has frequent heartburn, no hiatal hernia, no abdominal pain, no constipation, no diarrhea, no hematochezia, no hematemesis, no melena  GU:   no dysuria,  no frequency, no urinary tract infection, no hematuria, no enlarged prostate, no kidney stones, no kidney disease  Vascular:  no pain suggestive of claudication, no pain in feet, no leg cramps, no varicose veins, no DVT, no non-healing foot ulcer  Neuro:   no stroke, no TIA's, no seizures, has headaches, no temporary blindness one eye,  no slurred speech, no peripheral neuropathy, no chronic pain, no instability of gait, no memory/cognitive dysfunction  Musculoskeletal: no arthritis, no joint swelling, no myalgias, no difficulty walking, normal mobility   Skin:   no rash, no itching, no skin infections, no pressure sores or ulcerations  Psych:   has anxiety, no depression, no nervousness, no unusual recent stress  Eyes:   no blurry vision, has floaters, no recent vision changes,  wears glasses or contacts  ENT:   no hearing loss, no loose or painful teeth, no dentures, last saw dentist Sept 2018  Hematologic:  has easy bruising, no abnormal bleeding, no clotting disorder, no frequent epistaxis  Endocrine:  no diabetes, does not check CBG's at home       Physical Exam:   BP 113/78 (BP Location: Left Arm, Patient Position: Sitting, Cuff Size: Normal)   Pulse 83   Ht 5' 10"  (1.778 m)   Wt 162 lb (73.5 kg)   SpO2 98%   BMI 23.24 kg/m   General:  Well-developed gentleman in no distress    HEENT:  Unremarkable, NCAT, PERLA, EOMI, oropharynx clear.  Teeth in fair condition  Neck:   no JVD, no bruits, no adenopathy or thyromegaly  Chest:   clear to auscultation, symmetrical breath sounds, no wheezes, no rhonchi   CV:   RRR, grade III/VI crescendo/decrescendo murmur heard best at RSB,  no diastolic murmur  Abdomen:  soft, non-tender, no masses or organomegaly  Extremities:  warm, well-perfused, pulses palpable in feet, no LE edema  Rectal/GU  Deferred  Neuro:   Grossly non-focal and symmetrical throughout  Skin:   Clean and dry, no rashes, no breakdown   Diagnostic Tests:                             *  [facility]*                         [address] [place], [place] [address]                            [phone]  ------------------------------------------------------------------- Transthoracic Echocardiography  Patient:    Blayden, Conwell MR #:       [id number] Study Date: 09/02/2017 Gender:     M Age:        53 Height:     180.3 cm Weight:     79.9 kg BSA:        2.01 m^2 Pt. Status: Room:       [place], [place]  SONOGRAPHER  Johny Chess, RDCS, CCT  ATTENDING    Julianne Rice [id number]  PERFORMING   [facility], Inpatient  ORDERING     Samul Dada M  cc:  ------------------------------------------------------------------- LV EF: 15%  ------------------------------------------------------------------- Indications:      Abnormal EKG 794.31.  ------------------------------------------------------------------- History:   PMH:   Chronic obstructive pulmonary disease.  Risk factors:  Respiratory failure. Current tobacco use.  ------------------------------------------------------------------- Study Conclusions  - Left ventricle: The cavity size was severely dilated. The   estimated ejection fraction was 15%. Diffuse hypokinesis. Doppler   parameters are consistent with restrictive physiology,  indicative   of decreased left ventricular diastolic compliance and/or   increased left atrial pressure. - Aortic valve: Valve mobility was severely restricted. Dimensional   index is severely reduced. Suspect low-gradient severe aortic   stenosis. Valve area (VTI): 0.66 cm^2. Valve area (Vmax): 0.67   cm^2. Valve area (Vmean): 0.62 cm^2. - Left atrium: The atrium was moderately dilated. - Impressions: There is severe LV dysfunction in the setting of   probable low-grade aortic stenosis and end-stage AS   cardiomyopathy.  Impressions:  - There is severe LV dysfunction in the setting of probable   low-grade aortic stenosis and end-stage AS cardiomyopathy.  ------------------------------------------------------------------- Study data:  No prior study was available for comparison.  Study status:  Routine.  Procedure:  The patient was unable to respond to pain level query due to mechanical ventilation. The patient would not grade their pain level when asked, but did not appear in distress pre or post test. Transthoracic echocardiography. Image quality was adequate.  Study completion:  There were no complications.          Transthoracic echocardiography.  M-mode, complete 2D, spectral Doppler, and color Doppler.  Birthdate: Patient birthdate: 10-19-1951.  Age:  Patient is 66 yr old.  Sex: Gender: male.    BMI: 24.6 kg/m^2.  Blood pressure:     84/67 Patient status:  Inpatient.  Study date:  Study date: 09/02/2017. Study time: 01:48 PM.  Location:  ICU/CCU  -------------------------------------------------------------------  ------------------------------------------------------------------- Left ventricle:  The cavity size was severely dilated. The estimated ejection fraction was 15%. Diffuse hypokinesis. Doppler parameters are consistent with restrictive physiology, indicative of decreased left ventricular diastolic compliance and/or increased left atrial  pressure.  ------------------------------------------------------------------- Aortic valve:   Trileaflet; severely calcified leaflets. Valve mobility was severely restricted. Dimensional index is severely reduced. Suspect  low-gradient severe aortic stenosis.  Doppler: There was no significant regurgitation.    VTI ratio of LVOT to aortic valve: 0.19. Valve area (VTI): 0.66 cm^2. Indexed valve area (VTI): 0.33 cm^2/m^2. Peak velocity ratio of LVOT to aortic valve: 0.19. Valve area (Vmax): 0.67 cm^2. Indexed valve area (Vmax): 0.33 cm^2/m^2. Mean velocity ratio of LVOT to aortic valve: 0.18. Valve area (Vmean): 0.62 cm^2. Indexed valve area (Vmean): 0.31 cm^2/m^2.    Mean gradient (S): 25 mm Hg. Peak gradient (S): 43 mm Hg.  ------------------------------------------------------------------- Aorta:  Aortic root: The aortic root was normal in size. Ascending aorta: The ascending aorta was normal in size.  ------------------------------------------------------------------- Mitral valve:   Structurally normal valve.   Leaflet separation was normal.  Doppler:  Transvalvular velocity was within the normal range. There was no evidence for stenosis. There was trivial regurgitation.  ------------------------------------------------------------------- Left atrium:  The atrium was moderately dilated.  ------------------------------------------------------------------- Right ventricle:  The cavity size was normal. Wall thickness was normal. Systolic function was normal.  ------------------------------------------------------------------- Pulmonic valve:    Structurally normal valve. The valve appears to be grossly normal.   Cusp separation was normal.  Doppler: Transvalvular velocity was within the normal range. There was no significant regurgitation.  ------------------------------------------------------------------- Tricuspid valve:   Structurally normal valve.   Leaflet  separation was normal.  Doppler:  Transvalvular velocity was within the normal range. There was no regurgitation.  ------------------------------------------------------------------- Right atrium:  The atrium was normal in size.  ------------------------------------------------------------------- Pericardium:  There was no pericardial effusion.  ------------------------------------------------------------------- Systemic veins: Inferior vena cava: The vessel was dilated. The respirophasic diameter changes were blunted (< 50%), consistent with elevated central venous pressure.  ------------------------------------------------------------------- Measurements   Left ventricle                           Value          Reference  LV ID, ED, PLAX chordal          (H)     67.3  mm       43 - 52  LV ID, ES, PLAX chordal          (H)     59    mm       23 - 38  LV fx shortening, PLAX chordal   (L)     12    %        >=29  LV PW thickness, ED                      13.6  mm       ----------  IVS/LV PW ratio, ED                      0.82           <=1.3  Stroke volume, 2D                        44    ml       ----------  Stroke volume/bsa, 2D                    22    ml/m^2   ----------  LV end-diastolic volume, 2-p             233   ml       ----------  LV end-diastolic volume/bsa, 2-p  116   ml/m^2   ----------  LV e&', lateral                           13.4  cm/s     ----------    Ventricular septum                       Value          Reference  IVS thickness, ED                        11.1  mm       ----------    LVOT                                     Value          Reference  LVOT ID, S                               21    mm       ----------  LVOT area                                3.46  cm^2     ----------  LVOT peak velocity, S                    63.1  cm/s     ----------  LVOT mean velocity, S                    41.9  cm/s     ----------  LVOT VTI, S                               12.7  cm       ----------    Aortic valve                             Value          Reference  Aortic valve peak velocity, S            328   cm/s     ----------  Aortic valve mean velocity, S            233   cm/s     ----------  Aortic valve VTI, S                      66.6  cm       ----------  Aortic mean gradient, S                  25    mm Hg    ----------  Aortic peak gradient, S                  43    mm Hg    ----------  VTI ratio, LVOT/AV                       0.19           ----------  Aortic valve area, VTI                   0.66  cm^2     ----------  Aortic valve area/bsa, VTI               0.33  cm^2/m^2 ----------  Velocity ratio, peak, LVOT/AV            0.19           ----------  Aortic valve area, peak velocity         0.67  cm^2     ----------  Aortic valve area/bsa, peak              0.33  cm^2/m^2 ----------  velocity  Velocity ratio, mean, LVOT/AV            0.18           ----------  Aortic valve area, mean velocity         0.62  cm^2     ----------  Aortic valve area/bsa, mean              0.31  cm^2/m^2 ----------  velocity    Aorta                                    Value          Reference  Aortic root ID, ED                       36    mm       ----------    Left atrium                              Value          Reference  LA ID, A-P, ES                           52    mm       ----------  LA ID/bsa, A-P                   (H)     2.59  cm/m^2   <=2.2  LA volume, S                             66.6  ml       ----------  LA volume/bsa, S                         33.2  ml/m^2   ----------  LA volume, ES, 1-p A4C                   54.6  ml       ----------  LA volume/bsa, ES, 1-p A4C               27.2  ml/m^2   ----------  LA volume, ES, 1-p A2C                   80.5  ml       ----------  LA volume/bsa, ES, 1-p A2C  40.1  ml/m^2   ----------    Mitral valve                             Value          Reference  Mitral regurg VTI,  PISA                  161   cm       ----------    Right atrium                             Value          Reference  RA ID, S-I, ES, A4C              (H)     50.1  mm       34 - 49  RA area, ES, A4C                         15.1  cm^2     8.3 - 19.5  RA volume, ES, A/L                       36.5  ml       ----------  RA volume/bsa, ES, A/L                   18.2  ml/m^2   ----------    Systemic veins                           Value          Reference  Estimated CVP                            3     mm Hg    ----------    Right ventricle                          Value          Reference  TAPSE                                    15.5  mm       ----------  RV s&', lateral, S                        12    cm/s     ----------  Legend: (L)  and  (H)  mark values outside specified reference range.  ------------------------------------------------------------------- Prepared and Electronically Authenticated by  Pierre Bali, MD 2018-10-27T15:04:29   Procedures   RIGHT/LEFT HEART CATH AND CORONARY ANGIOGRAPHY  Conclusion     Prox RCA lesion, 50 %stenosed.  Mid RCA lesion, 30 %stenosed.  Mid LAD lesion, 30 %stenosed.  Mid Cx to Dist Cx lesion, 20 %stenosed.   Findings:  Ao = 96/67 (81) LV 140/19 RA = 4 RV = 31/7 PA = 28/13 (21) PCW = 14 Fick cardiac output/index =3.7/1.9 PVR = 1.9 WU Ao sat = 97% PA sat = 69%, 65%  Aortic valve  Peak gradient 19mHG Mean gradient 24 mmHG  Assessment:  1. Mild non-obstructive CAD with 50% proximal RCA with spasm 2. Severe NICM with EF 10-15% 3. Moderately reduced cardiac output 4. Likely low-gradient severe AS  Plan/Discussion:  Have discussed with TAVR team who will see him while inpatient to begin TAVR w/u.   Probable TAVR in 4-6 weeks after he recovers from parainfluenzae infection. With repeat echo prior.  Bensimhon, Quillian Quince, MD  9:33 AM      Indications   Nonrheumatic aortic valve stenosis [I35.0  (OLM-78-ML)]  Acute systolic heart failure ([place]) [I50.21 (ICD-10-CM)]  Procedural Details/Technique   Technical Details The risks and indication of the procedure were explained. Consent was signed and placed on the chart. An appropriate timeout was taken prior to the procedure.   The right AC fossa was prepped and draped in the routine sterile fashion and anesthetized with 1% local lidocaine. The pre-existing PIV in the right [facility] was exchanged over a wire for a 5 FR venous sheath using a modified Seldinger technique. A standard Swan-Ganz catheter was used for the procedure.   After a normal Allen's test was confirmed, the right wrist was prepped and draped in the routine sterile fashion and anesthetized with 1% local lidocaine. A 5 FR arterial sheath was then placed in the right radial artery using a modified Seldinger technique. 80m IV verapamil was given through the sheath. Systemic heparin was administered. Standard catheters including a JL 3.5 and a JR 4 were used. The aortic valve was crossed with a JR4 and a straight wire. All catheter exchanges were made over a wire.   Estimated blood loss <50 mL.  During this procedure the patient was administered the following to achieve and maintain moderate conscious sedation: Versed 1 mg, Fentanyl 25 mcg, while the patient's heart rate, blood pressure, and oxygen saturation were continuously monitored. The period of conscious sedation was 32 minutes, of which I was present face-to-face 100% of this time.  Coronary Findings   Diagnostic  Dominance: Right  Left Anterior Descending  Mid LAD lesion 30% stenosed  Mid LAD lesion.  Left Circumflex  Mid Cx to Dist Cx lesion 20% stenosed  Mid Cx to Dist Cx lesion.  Right Coronary Artery  Prox RCA lesion 50% stenosed  Prox RCA lesion.  Mid RCA lesion 30% stenosed  Mid RCA lesion.  Intervention   No interventions have been documented.  Wall Motion   EF 10-15%        Coronary Diagrams    Diagnostic Diagram       Implants     No implant documentation for this case.  MERGE Images   Show images for CARDIAC CATHETERIZATION   Link to Procedure Log   Procedure Log    Hemo Data    Most Recent Value  Fick Cardiac Output 3.73 L/min  Fick Cardiac Output Index 1.97 (L/min)/BSA  Aortic Mean Gradient 23.8 mmHg  Aortic Peak Gradient 34 mmHg  Aortic Valve Area 0.77  Aortic Value Area Index 0.41 cm2/BSA  RA A Wave 9 mmHg  RA V Wave 4 mmHg  RA Mean 4 mmHg  RV Systolic Pressure 31 mmHg  RV Diastolic Pressure 2 mmHg  RV EDP 7 mmHg  PA Systolic Pressure 28 mmHg  PA Diastolic Pressure 13 mmHg  PA Mean 21 mmHg  PW A Wave 18 mmHg  PW V Wave 18 mmHg  PW Mean 14 mmHg  AO Systolic Pressure 96 mmHg  AO Diastolic Pressure 67 mmHg  AO Mean 81 mmHg  LV Systolic Pressure 1544mmHg  LV Diastolic Pressure 12 mmHg  LV EDP 19 mmHg  Arterial Occlusion Pressure Extended Systolic Pressure 037 mmHg  Arterial Occlusion Pressure Extended Diastolic Pressure 72 mmHg  Arterial Occlusion Pressure Extended Mean Pressure 88 mmHg  Left Ventricular Apex Extended Systolic Pressure 048 mmHg  Left Ventricular Apex Extended Diastolic Pressure 9 mmHg  Left Ventricular Apex Extended EDP Pressure 19 mmHg  QP/QS 1  TPVR Index 10.65 HRUI  TSVR Index 41.07 HRUI  PVR SVR Ratio 0.09  TPVR/TSVR Ratio 0.26   ADDENDUM REPORT: 09/11/2017 09:13  EXAM: OVER-READ INTERPRETATION  CT CHEST  The following report is an over-read performed by radiologist Dr. Samara Snide [facility] on 09/11/2017. This over-read does not include interpretation of cardiac or coronary anatomy or pathology. The coronary CTA interpretation by the cardiologist is attached.  COMPARISON:  09/05/2017 chest radiograph.  FINDINGS: Please see the dedicated concurrent chest CT angiogram report for details regarding incidental extracardiac findings in the chest. Note is made of moderate centrilobular emphysema  with diffuse bronchial wall thickening. Cardiomegaly. Mild dependent bibasilar atelectasis, left greater than right. No suspicious focal osseous lesions. Moderate thoracic spondylosis.  IMPRESSION: Please see the separate dedicated concurrent chest CT angiogram report for details regarding incidental extracardiac findings in the chest.   Electronically Signed   By: Ilona Sorrel M.D.   On: 09/11/2017 09:13   Addended by Sharyn Blitz, MD on 09/11/2017 9:16 AM    Study Result   CLINICAL DATA:  66 year old male with severe aortic stenosis being evaluated for a TAVR procedure.  EXAM: Cardiac TAVR CT  TECHNIQUE: The patient was scanned on a [employer]. A 120 kV retrospective scan was triggered in the descending thoracic aorta at 111 HU's. Gantry rotation speed was 250 msecs and collimation was .6 mm. No beta blockade or nitro were given. The 3D data set was reconstructed in 5% intervals of the R-R cycle. Systolic and diastolic phases were analyzed on a dedicated work station using MPR, MIP and VRT modes. The patient received 80 cc of contrast.  FINDINGS: Aortic Valve: Bicuspid with severely calcified leaflets and significant calcifications extending into the LVOT predominantly under the non-coronary leaflet.  Aorta:  Normal size, mild diffuse calcifications.  Sinotubular Junction:  31 x 20 mm  Ascending Thoracic Aorta:  36 x 36 mm  Aortic Arch:  25 x 23 mm  Descending Thoracic Aorta:  25 x 23 mm  Sinus of Valsalva Measurements:  36 x 32 mm  Coronary Artery Height above Annulus:  Left Main:  16 mm  Right Coronary:  14 mm  Virtual Basal Annulus Measurements:  Maximum/Minimum Diameter:  31.1 x 21.2 mm  Perimeter:  82.7 mm  Area:  501 mm2  Optimum Fluoroscopic Angle for Delivery:  LAO 2 CAU 1  IMPRESSION: 1. Bicuspid aortic valve with severely calcified leaflets and significant calcifications extending into the  LVOT predominantly under the non-coronary leaflet. Annular measurements suitable for delivery of a 26 mm Edwards-SAPIEN 3 valve.  2.  Sufficient coronary to annulus distance.  3. Optimum Fluoroscopic Angle for Delivery:  LAO 2 CAU 1  4. No thrombus in the left atrial appendage.  Electronically Signed: By: Ena Dawley On: 09/08/2017 15:56       CLINICAL DATA:  Symptomatic aortic stenosis.  TAVR evaluation.  EXAM: CT ANGIOGRAPHY CHEST, ABDOMEN AND PELVIS  TECHNIQUE: Multidetector CT imaging through the chest, abdomen and pelvis was performed using the standard protocol during bolus administration of intravenous contrast. Multiplanar reconstructed images  and MIPs were obtained and reviewed to evaluate the vascular anatomy.  CONTRAST:  80 cc Isovue 370 IV.  COMPARISON:  09/05/2017 chest radiograph. CT enterography of the abdomen and pelvis from 02/20/2008.  FINDINGS: CTA CHEST FINDINGS  Cardiovascular: Cardiomegaly with prominent dilatation of the left ventricle. No significant pericardial fluid/thickening. Left anterior descending, left circumflex and right coronary atherosclerosis. Aortic valve is coarsely calcified and thickened. Atherosclerotic nonaneurysmal thoracic aorta. No evidence of acute intramural hematoma, dissection, pseudoaneurysm or penetrating atherosclerotic ulcer in the thoracic aorta. Aortic arch branch vessels are patent. Normal caliber pulmonary arteries. No central pulmonary emboli. Left PICC terminates in the upper third of the superior vena cava.  Mediastinum/Nodes: No discrete thyroid nodules. Unremarkable esophagus. No pathologically enlarged axillary, mediastinal or hilar lymph nodes.  Lungs/Pleura: No pneumothorax. Trace dependent bilateral pleural effusions. Moderate centrilobular emphysema with diffuse bronchial wall thickening. Mild dependent basilar bilateral lower lobe atelectasis, left greater than right. Otherwise  no acute consolidative airspace disease or lung masses. Several scattered solid pulmonary nodules in both lungs, largest 7 mm in the peripheral left upper lobe (series 15/image 32).  Musculoskeletal: No aggressive appearing focal osseous lesions. Moderate thoracic spondylosis.  Review of the MIP images confirms the above findings.  CTA ABDOMEN AND PELVIS FINDINGS  Hepatobiliary: Normal liver size. There are a few scattered subcentimeter hyperdense foci throughout the liver, too small to characterize, for which no follow-up is required unless the patient has risk factors for liver malignancy. Normal gallbladder with no radiopaque cholelithiasis. No biliary ductal dilatation.  Pancreas: Normal, with no mass or duct dilation.  Spleen: Normal size. No mass.  Adrenals/Urinary Tract: Right adrenal 1.0 cm nodule, not convincingly changed since 02/20/2008 CT, compatible with a benign adenoma. No left adrenal nodules. Simple bilateral renal cysts, largest 4.8 cm in the lateral lower right kidney and 3.7 cm in the lower left kidney. Additional subcentimeter hypodense renal cortical lesions in both kidneys, too small to characterize, which require no follow-up. No hydronephrosis. Normal bladder.  Stomach/Bowel: Grossly normal stomach. Normal caliber small bowel with no small bowel wall thickening. Normal appendix. Minimal sigmoid diverticulosis, with no large bowel wall thickening or pericolonic fat stranding.  Vascular/Lymphatic: Atherosclerotic nonaneurysmal abdominal aorta. No pathologically enlarged lymph nodes in the abdomen or pelvis.  Reproductive: Normal size prostate.  Other: No pneumoperitoneum, ascites or focal fluid collection.  Musculoskeletal: No aggressive appearing focal osseous lesions. Moderate lumbar spondylosis.  VASCULAR MEASUREMENTS PERTINENT TO TAVR:  AORTA:  Minimal Aortic Diameter -  15 x 13 mm (infrarenal abdominal aorta)  Severity of  Aortic Calcification -  moderate  RIGHT PELVIS:  Right Common Iliac Artery -  Minimal Diameter - 10.3 x 6.6 mm  Tortuosity - mild-to-moderate  Calcification - mild  Right External Iliac Artery -  Minimal Diameter - 6.1 x 5.8 mm  Tortuosity - mild  Calcification - none  Right Common Femoral Artery -  Minimal Diameter - 6.8 x 6.4 mm  Tortuosity - mild  Calcification - none  LEFT PELVIS:  Left Common Iliac Artery -  Minimal Diameter - 7.7 x 5.1 mm  Tortuosity - mild-to-moderate  Calcification - mild  Left External Iliac Artery -  Minimal Diameter - 5.9 x 5.5 mm  Tortuosity - mild  Calcification - none  Left Common Femoral Artery -  Minimal Diameter - 6.6 x 5.9 mm  Tortuosity - mild  Calcification - none  Review of the MIP images confirms the above findings.  IMPRESSION: 1. Vascular findings and measurements pertinent to  potential TAVR procedure, as detailed above. 2. Severe thickening and calcification of the aortic valve, compatible with the reported clinical history of symptomatic aortic stenosis. 3. Cardiomegaly.  Three-vessel coronary atherosclerosis. 4. Trace dependent bilateral pleural effusions with mild bibasilar atelectasis. 5. Aortic Atherosclerosis (ICD10-I70.0) and Emphysema (ICD10-J43.9). 6. Scattered bilateral solid pulmonary nodules, largest 7 mm in the left upper lobe. Non-contrast chest CT at 3-6 months is recommended. If the nodules are stable at time of repeat CT, then future CT at 18-24 months (from today's scan) is considered optional for low-risk patients, but is recommended for high-risk patients. This recommendation follows the consensus statement: Guidelines for Management of Incidental Pulmonary Nodules Detected on CT Images: From the Fleischner Society 2017; Radiology 2017; 284:228-243. 7. Minimal sigmoid diverticulosis.   Electronically Signed   By: Ilona Sorrel M.D.   On: 09/11/2017  09:09   Impression:  The patient has stage D, severe, symptomatic, low flow, low gradient aortic stenosis presenting recently with NYHA class 3-4 symptoms of shortness of breath and fatigue at rest with acute respiratory failure. He was diagnosed with parainfluenzae during his recent admission with respiratory failure and was found to have severe LV dysfunction with an EF of 15%. It is unclear if his LV dysfunction and respiratory failure was due to his AS or the viral infection. I have personally reviewed his echo, cath and CTA studies. The echo showed a trileaflet aortic valve with severely calcified leaflets and restricted mobility. The DI was 0.19 and the mean gradient only 25 mm Hg. His EF was 15% with a dilated and globally hypokinetic LV consistent with low EF, low gradient severe AS. His cath shows moderate stenosis of the ostial and proximal RCA estimated at 50%. I agree that AVR is indicated in this patient. His gated cardiac CT shows a bicuspid aortic valve with severely calcified leaflets and significant calcifications extending into the LVOT predominantly under the non-coronary leaflet. Annular measurement is suitable for a 26 mm Sapien 3 valve but the bicuspid nature and calcification extending into the LVOT are not ideal for TAVR and could increase the risk of paravalvular leak. His low EF would certainly increase his risk of open surgical AVR. If I was going to do an open surgical AVR I would bypass is RCA because I think the proximal stenosis is significant. I reviewed the cath, echo and CT images with him and his family and answered their questions. He is scheduled for a repeat echo to reassess his LV function on 10/09/2017 and I will wait until that has been done to decide about further treatment. Given his age and the bicuspid valve with calcium extending into the LVOT I wold favor open surgical AVR is his LV function looks better.   Plan:  The patient will have an echo on 10/09/2017 and  then we will decide about SAVR/CABG vs TAVR.   I spent 60 minutes performing this consultation and > 50% of this time was spent face to face counseling and coordinating the care of this patient's severe aortic stenosis.    Gaye Pollack, MD 10/03/2017

## 2017-10-09 ENCOUNTER — Other Ambulatory Visit: Payer: Self-pay | Admitting: Internal Medicine

## 2017-10-09 ENCOUNTER — Ambulatory Visit (HOSPITAL_COMMUNITY): Payer: Medicare HMO | Attending: Internal Medicine

## 2017-10-09 DIAGNOSIS — I051 Rheumatic mitral insufficiency: Secondary | ICD-10-CM | POA: Diagnosis not present

## 2017-10-09 DIAGNOSIS — I420 Dilated cardiomyopathy: Secondary | ICD-10-CM | POA: Insufficient documentation

## 2017-10-09 DIAGNOSIS — Z953 Presence of xenogenic heart valve: Secondary | ICD-10-CM

## 2017-10-13 ENCOUNTER — Encounter: Payer: Self-pay | Admitting: Thoracic Surgery (Cardiothoracic Vascular Surgery)

## 2017-10-13 ENCOUNTER — Other Ambulatory Visit: Payer: Self-pay

## 2017-10-13 ENCOUNTER — Institutional Professional Consult (permissible substitution): Payer: Medicare HMO | Admitting: Thoracic Surgery (Cardiothoracic Vascular Surgery)

## 2017-10-13 VITALS — BP 116/80 | HR 86 | Resp 20 | Ht 70.0 in | Wt 168.0 lb

## 2017-10-13 DIAGNOSIS — I350 Nonrheumatic aortic (valve) stenosis: Secondary | ICD-10-CM | POA: Diagnosis not present

## 2017-10-13 NOTE — Progress Notes (Signed)
[facility]  MULTIDISCIPLINARY HEART VALVE CLINIC  CARDIOTHORACIC SURGERY CONSULTATION REPORT  Referring Provider is Bensimhon, Shaune Pascal, MD PCP is Starlyn Skeans, PA-C  Chief Complaint  Patient presents with  . Aortic Stenosis    2nd evaluation    HPI:  Patient is a 66 year old male with long-standing history of heart murmur, recently discovered severe aortic stenosis, nonischemic cardiomyopathy, and COPD with ongoing tobacco abuse who has been referred for second surgical opinion to discuss treatment options for management of severe symptomatic aortic stenosis.  Patient states that he was told he had a heart murmur more than 20 years ago.  He was never formally evaluated by a cardiologist nor had he undergone transthoracic echocardiogram until recently.  He describes a long history of exertional shortness of breath and fatigue that has progressed slowly over the last few years.  Approximately 6 weeks ago he developed sudden onset of severe shortness of breath for which he presented acutely on September 01, 2017 in an acute hypoxemic respiratory failure requiring intubation and mechanical ventilation.  He was diagnosed with acute viral pneumonia secondary to parainfluenza virus complicated by the presence of underlying severe aortic stenosis with severe left ventricular systolic dysfunction and COPD.  During his hospitalization he underwent left and right heart catheterization on September 06, 2017 revealing mild nonobstructive coronary artery disease with relatively low filling pressures and mildly reduced cardiac output.  He was evaluated by Dr. Burt Knack from the multidisciplinary heart valve team and plans were made for delayed referral for surgical evaluation after CT angiography.  The patient was seen recently by Dr. Cyndia Bent who felt that the patient might best be treated with conventional surgery presuming that his cardiac function had improved since hospital discharge.  The  patient was referred for second surgical opinion.  Patient is divorced and lives alone near [place].  He has 2 adult daughters, 1 of whom lives next door, accompanies him for his consultation today and is very supportive.  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  He has remained functionally independent and reasonably active physically although he admits to slow gradual decline in his exercise tolerance for the last few years.  Since hospital discharge she has done well.  He states that he still gets short of breath with moderate level of exertion.  He denies any resting shortness of breath, PND, orthopnea, or lower extremity edema.  He has never had any chest pain or chest tightness either with activity or at rest.  His cough has nearly completely resolved since hospital discharge.  Unfortunately he continues to smoke cigarettes, currently 1/2 pack cigarettes daily.  His review of systems is also notable for problems with frequent heartburn which is made worse if he lays flat in bed at night.  Past Medical History:  Diagnosis Date  . Anxiety   . Aortic stenosis, severe    a. suspect low flow, low gradient severe AS.   Marland Kitchen COPD (chronic obstructive pulmonary disease) ([place])   . IBD (inflammatory bowel disease)    a. s/p partial colectomy in 2009  . LBBB (left bundle branch block)   . NICM (nonischemic cardiomyopathy) ([place])    a. 08/2014: EF 15% suspect to be endstage CM 2/2 severe AS  . Tobacco abuse     Past Surgical History:  Procedure Laterality Date  . COLON SURGERY    . HERNIA REPAIR    . IR FLUORO GUIDE CV  MIDLINE PICC LEFT  09/04/2017  . RIGHT/LEFT HEART CATH AND CORONARY ANGIOGRAPHY N/A 09/06/2017   Procedure: RIGHT/LEFT HEART CATH AND CORONARY ANGIOGRAPHY;  Surgeon: Jolaine Artist, MD;  Location: [facility];  Service: Cardiovascular;  Laterality: N/A;    Family History  Problem Relation  Age of Onset  . Hypertension Father   . Heart attack Father   . Lung cancer Brother     Social History   Socioeconomic History  . Marital status: Married    Spouse name: Not on file  . Number of children: Not on file  . Years of education: Not on file  . Highest education level: Not on file  Social Needs  . Financial resource strain: Not on file  . Food insecurity - worry: Not on file  . Food insecurity - inability: Not on file  . Transportation needs - medical: Not on file  . Transportation needs - non-medical: Not on file  Occupational History  . Not on file  Tobacco Use  . Smoking status: Current Every Day Smoker    Packs/day: 2.00    Types: Cigarettes    Start date: 2  . Smokeless tobacco: Former Network engineer and Sexual Activity  . Alcohol use: Yes    Comment: rarely  . Drug use: No  . Sexual activity: Not on file  Other Topics Concern  . Not on file  Social History Narrative  . Not on file    Current Outpatient Medications  Medication Sig Dispense Refill  . acetaminophen (TYLENOL) 500 MG tablet Take 500 mg by mouth every 6 (six) hours as needed.    Marland Kitchen atorvastatin (LIPITOR) 20 MG tablet Take 1 tablet (20 mg total) by mouth daily at 6 PM. 30 tablet 2  . clonazePAM (KLONOPIN) 0.5 MG tablet Take 0.5 mg by mouth daily as needed for anxiety.     . digoxin (LANOXIN) 0.125 MG tablet Take 1 tablet (0.125 mg total) by mouth daily. 30 tablet 2   No current facility-administered medications for this visit.     No Known Allergies    Review of Systems:   General:  normal appetite, decreased energy, no weight gain, + weight loss, no fever  Cardiac:  no chest pain with exertion, no chest pain at rest, + SOB with exertion, no resting SOB, no PND, no orthopnea, no palpitations, no arrhythmia, no atrial fibrillation, no LE edema, no dizzy spells, no syncope  Respiratory:  + shortness of breath, no home oxygen, no productive cough, + dry cough, no bronchitis, no  wheezing, no hemoptysis, no asthma, no pain with inspiration or cough, no sleep apnea, no CPAP at night  GI:   no difficulty swallowing, + reflux, + frequent heartburn, no hiatal hernia, no abdominal pain, no constipation, no diarrhea, no hematochezia, no hematemesis, no melena  GU:   no dysuria,  no frequency, no urinary tract infection, no hematuria, no enlarged prostate, no kidney stones, no kidney disease  Vascular:  no pain suggestive of claudication, no pain in feet, no leg cramps, no varicose veins, no DVT, no non-healing foot ulcer  Neuro:   no stroke, no TIA's, no seizures, no headaches, no temporary blindness one eye,  no slurred speech, no peripheral neuropathy, no chronic pain, no instability of gait, no memory/cognitive dysfunction  Musculoskeletal: no arthritis, no joint swelling, no myalgias, no difficulty walking, normal mobility   Skin:   no rash, no itching, no skin infections, no pressure sores or ulcerations  Psych:   +  anxiety, no depression, + nervousness, + unusual recent stress  Eyes:   no blurry vision, no floaters, no recent vision changes, + wears glasses or contacts  ENT:   no hearing loss, no loose or painful teeth, no dentures, last saw dentist 07/2017  Hematologic:  no easy bruising, no abnormal bleeding, no clotting disorder, no frequent epistaxis  Endocrine:  no diabetes, does not check CBG's at home           Physical Exam:   BP 116/80   Pulse 86   Resp 20   Ht 5' 10"  (1.778 m)   Wt 168 lb (76.2 kg)   SpO2 98% Comment: on RA  BMI 24.11 kg/m   General:    well-appearing  HEENT:  Unremarkable   Neck:   no JVD, no bruits, no adenopathy   Chest:   clear to auscultation, symmetrical breath sounds, no wheezes, no rhonchi   CV:   RRR, grade III/VI crescendo/decrescendo murmur heard best at RLSB,  no diastolic murmur  Abdomen:  soft, non-tender, no masses   Extremities:  warm, well-perfused, pulses palpable, no LE  edema  Rectal/GU  Deferred  Neuro:   Grossly non-focal and symmetrical throughout  Skin:   Clean and dry, no rashes, no breakdown   Diagnostic Tests:  Transthoracic Echocardiography  Patient:    Kenneth Mills, Kenneth Mills MR #:       [id number] Study Date: 10/09/2017 Gender:     M Age:        70 Height:     177.8 cm Weight:     73.5 kg BSA:        1.91 m^2 Pt. Status: Room:   ATTENDING    Dorris Carnes, M.D.  SONOGRAPHER  Diamond Nickel  ORDERING     Bensimhon, Glynis Smiles    Bensimhon, Quillian Quince  PERFORMING   [facility], Outpatient  cc:  -------------------------------------------------------------------  ------------------------------------------------------------------- Indications:      Z95.3 Status post TAVR.  ------------------------------------------------------------------- History:   PMH:  Left bundle branch block. Aortic stenosis. Congestive heart failure.  Chronic obstructive pulmonary disease. Risk factors:  Current tobacco use.  ------------------------------------------------------------------- Study Conclusions  - Left ventricle: LVEF is severely depressed at approximately 15 to   20% severe diffuse hypokinesis/akinesis Only the basal lateral   wall thickens well. The cavity size was severely dilated. Wall   thickness was increased in a pattern of mild LVH. - Aortic valve: AV is thickened, calcified with restricted motion.   Peak and mean gradients through the valve are 50 and 24 mm Hg   respectively. Velocity ratio is 0.14 consistent with severe AS. - Mitral valve: Moderately calcified annulus. Moderately thickened   leaflets . There was mild regurgitation.  ------------------------------------------------------------------- Study data:  Comparison was made to the study of 09/02/2017.  Study status:  Routine.  Procedure:  The patient reported no pain pre or post test. Transthoracic echocardiography. Image quality was adequate.  Study completion:  There  were no complications. Transthoracic echocardiography.  M-mode, complete 2D, spectral Doppler, and color Doppler.  Birthdate:  Patient birthdate: Jan 25, 1951.  Age:  Patient is 66 yr old.  Sex:  Gender: male. BMI: 23.2 kg/m^2.  Blood pressure:     110/78  Patient status: Outpatient.  Study date:  Study date: 10/09/2017. Study time: 10:17 AM.  Location:  [facility]  -------------------------------------------------------------------  ------------------------------------------------------------------- Left ventricle:  LVEF is severely depressed at approximately 15 to 20% severe diffuse hypokinesis/akinesis Only the basal lateral wall thickens well.  The cavity size was severely dilated. Wall thickness was increased in a pattern of mild LVH.  ------------------------------------------------------------------- Aortic valve:  AV is thickened, calcified with restricted motion. Peak and mean gradients through the valve are 50 and 24 mm Hg respectively. Velocity ratio is 0.14 consistent with severe AS. Doppler:     VTI ratio of LVOT to aortic valve: 0.13. Peak velocity ratio of LVOT to aortic valve: 0.14. Mean velocity ratio of LVOT to aortic valve: 0.13.    Mean gradient (S): 21 mm Hg. Peak gradient (S): 41 mm Hg.  ------------------------------------------------------------------- Mitral valve:   Moderately calcified annulus. Moderately thickened leaflets .  Doppler:  There was mild regurgitation.    Valve area by pressure half-time: 3.73 cm^2. Indexed valve area by pressure half-time: 1.96 cm^2/m^2.    Mean gradient (D): 3 mm Hg.  ------------------------------------------------------------------- Left atrium:  The atrium was normal in size.  ------------------------------------------------------------------- Right ventricle:  The cavity size was normal. Wall thickness was normal. Systolic function was  normal.  ------------------------------------------------------------------- Pulmonic valve:    Structurally normal valve.   Cusp separation was normal.  Doppler:  Transvalvular velocity was within the normal range. There was mild regurgitation.  ------------------------------------------------------------------- Tricuspid valve:   Structurally normal valve.   Leaflet separation was normal.  Doppler:  Transvalvular velocity was within the normal range. There was trivial regurgitation.  ------------------------------------------------------------------- Right atrium:  The atrium was normal in size.  ------------------------------------------------------------------- Pericardium:  There was no pericardial effusion.  ------------------------------------------------------------------- Measurements   Left ventricle                         Value          Reference  LV ID, ED, PLAX chordal        (H)     62.6  mm       43 - 52  LV ID, ES, PLAX chordal        (H)     60.2  mm       23 - 38  LV fx shortening, PLAX chordal (L)     4     %        >=29  LV PW thickness, ED                    12.6  mm       ----------  IVS/LV PW ratio, ED                    1.01           <=1.3  LV e&', lateral                         7.95  cm/s     ----------  LV e&', medial                          6.83  cm/s     ----------  LV e&', average                         7.39  cm/s     ----------    Ventricular septum                     Value          Reference  IVS thickness, ED  12.7  mm       ----------    LVOT                                   Value          Reference  LVOT peak velocity, S                  44.3  cm/s     ----------  LVOT mean velocity, S                  26.9  cm/s     ----------  LVOT VTI, S                            9.14  cm       ----------    Aortic valve                           Value          Reference  Aortic valve peak velocity, S          321   cm/s      ----------  Aortic valve mean velocity, S          202   cm/s     ----------  Aortic valve VTI, S                    69.8  cm       ----------  Aortic mean gradient, S                21    mm Hg    ----------  Aortic peak gradient, S                41    mm Hg    ----------  VTI ratio, LVOT/AV                     0.13           ----------  Velocity ratio, peak, LVOT/AV          0.14           ----------  Velocity ratio, mean, LVOT/AV          0.13           ----------    Left atrium                            Value          Reference  LA ID, A-P, ES                         48    mm       ----------  LA ID/bsa, A-P                 (H)     2.52  cm/m^2   <=2.2  LA volume, S                           65    ml       ----------  LA volume/bsa, S  34.1  ml/m^2   ----------  LA volume, ES, 1-p A4C                 50.1  ml       ----------  LA volume/bsa, ES, 1-p A4C             26.3  ml/m^2   ----------  LA volume, ES, 1-p A2C                 76.9  ml       ----------  LA volume/bsa, ES, 1-p A2C             40.3  ml/m^2   ----------    Mitral valve                           Value          Reference  Mitral mean velocity, D                67.7  cm/s     ----------  Mitral pressure half-time              59    ms       ----------  Mitral mean gradient, D                3     mm Hg    ----------  Mitral valve area, PHT, DP             3.73  cm^2     ----------  Mitral valve area/bsa, PHT, DP         1.96  cm^2/m^2 ----------  Mitral annulus VTI, D                  48.8  cm       ----------  Mitral regurg VTI, PISA                196   cm       ----------    Right atrium                           Value          Reference  RA ID, S-I, ES, A4C            (H)     49.9  mm       34 - 49  RA area, ES, A4C                       13.3  cm^2     8.3 - 19.5  RA volume, ES, A/L                     28.6  ml       ----------  RA volume/bsa, ES, A/L                 15    ml/m^2    ----------    Right ventricle                        Value          Reference  RV s&', lateral, S                      11.6  cm/s     ----------    Pulmonic valve                         Value          Reference  Pulmonic regurg velocity, ED           181   cm/s     ----------  Pulmonic regurg gradient, ED           13    mm Hg    ----------  Legend: (L)  and  (H)  mark values outside specified reference range.  ------------------------------------------------------------------- Prepared and Electronically Authenticated by  Dorris Carnes, M.D. 2018-12-03T17:27:35    RIGHT/LEFT HEART CATH AND CORONARY ANGIOGRAPHY  Conclusion     Prox RCA lesion, 50 %stenosed.  Mid RCA lesion, 30 %stenosed.  Mid LAD lesion, 30 %stenosed.  Mid Cx to Dist Cx lesion, 20 %stenosed.   Findings:  Ao = 96/67 (81) LV 140/19 RA = 4 RV = 31/7 PA = 28/13 (21) PCW = 14 Fick cardiac output/index =3.7/1.9 PVR = 1.9 WU Ao sat = 97% PA sat = 69%, 65%  Aortic valve  Peak gradient 37mHG Mean gradient 24 mmHG  Assessment: 1. Mild non-obstructive CAD with 50% proximal RCA with spasm 2. Severe NICM with EF 10-15% 3. Moderately reduced cardiac output 4. Likely low-gradient severe AS  Plan/Discussion:  Have discussed with TAVR team who will see him while inpatient to begin TAVR w/u.   Probable TAVR in 4-6 weeks after he recovers from parainfluenzae infection. With repeat echo prior.  Bensimhon, DQuillian Quince MD  9:33 AM      Indications   Nonrheumatic aortic valve stenosis [I35.0 (IEPP-29-JJ)] Acute systolic heart failure ([place] [I50.21 (ICD-10-CM)]  Procedural Details/Technique   Technical Details The risks and indication of the procedure were explained. Consent was signed and placed on the chart. An appropriate timeout was taken prior to the procedure.   The right AC fossa was prepped and draped in the routine sterile fashion and anesthetized with 1% local lidocaine. The  pre-existing PIV in the right [facility]was [facility] over a wire for a 5 FR venous sheath using a modified Seldinger technique. A standard Swan-Ganz catheter was used for the procedure.   After a normal Allen's test was confirmed, the right wrist was prepped and draped in the routine sterile fashion and anesthetized with 1% local lidocaine. A 5 FR arterial sheath was then placed in the right radial artery using a modified Seldinger technique. 32mIV verapamil was given through the sheath. Systemic heparin was administered. Standard catheters including a JL 3.5 and a JR 4 were used. The aortic valve was crossed with a JR4 and a straight wire. All catheter exchanges were made over a wire.   Estimated blood loss <50 mL.  During this procedure the patient was administered the following to achieve and maintain moderate conscious sedation: Versed 1 mg, Fentanyl 25 mcg, while the patient's heart rate, blood pressure, and oxygen saturation were continuously monitored. The period of conscious sedation was 32 minutes, of which I was present face-to-face 100% of this time.  Coronary Findings   Diagnostic  Dominance: Right  Left Anterior Descending  Mid LAD lesion 30% stenosed  Mid LAD lesion.  Left Circumflex  Mid Cx to Dist Cx lesion 20% stenosed  Mid Cx to Dist Cx lesion.  Right Coronary Artery  Prox RCA lesion 50% stenosed  Prox RCA lesion.  Mid  RCA lesion 30% stenosed  Mid RCA lesion.  Intervention   No interventions have been documented.  Wall Motion   EF 10-15%        Coronary Diagrams   Diagnostic Diagram       Implants     No implant documentation for this case.  MERGE Images   Show images for CARDIAC CATHETERIZATION   Link to Procedure Log   Procedure Log    Hemo Data    Most Recent Value  Fick Cardiac Output 3.73 L/min  Fick Cardiac Output Index 1.97 (L/min)/BSA  Aortic Mean Gradient 23.8 mmHg  Aortic Peak Gradient 34 mmHg  Aortic Valve Area 0.77  Aortic Value Area  Index 0.41 cm2/BSA  RA A Wave 9 mmHg  RA V Wave 4 mmHg  RA Mean 4 mmHg  RV Systolic Pressure 31 mmHg  RV Diastolic Pressure 2 mmHg  RV EDP 7 mmHg  PA Systolic Pressure 28 mmHg  PA Diastolic Pressure 13 mmHg  PA Mean 21 mmHg  PW A Wave 18 mmHg  PW V Wave 18 mmHg  PW Mean 14 mmHg  AO Systolic Pressure 96 mmHg  AO Diastolic Pressure 67 mmHg  AO Mean 81 mmHg  LV Systolic Pressure 701 mmHg  LV Diastolic Pressure 12 mmHg  LV EDP 19 mmHg  Arterial Occlusion Pressure Extended Systolic Pressure 779 mmHg  Arterial Occlusion Pressure Extended Diastolic Pressure 72 mmHg  Arterial Occlusion Pressure Extended Mean Pressure 88 mmHg  Left Ventricular Apex Extended Systolic Pressure 390 mmHg  Left Ventricular Apex Extended Diastolic Pressure 9 mmHg  Left Ventricular Apex Extended EDP Pressure 19 mmHg  QP/QS 1  TPVR Index 10.65 HRUI  TSVR Index 41.07 HRUI  PVR SVR Ratio 0.09  TPVR/TSVR Ratio 0.26      Cardiac TAVR CT  TECHNIQUE: The patient was scanned on a [employer]. A 120 kV retrospective scan was triggered in the descending thoracic aorta at 111 HU's. Gantry rotation speed was 250 msecs and collimation was .6 mm. No beta blockade or nitro were given. The 3D data set was reconstructed in 5% intervals of the R-R cycle. Systolic and diastolic phases were analyzed on a dedicated work station using MPR, MIP and VRT modes. The patient received 80 cc of contrast.  FINDINGS: Aortic Valve: Bicuspid with severely calcified leaflets and significant calcifications extending into the LVOT predominantly under the non-coronary leaflet.  Aorta:  Normal size, mild diffuse calcifications.  Sinotubular Junction:  31 x 20 mm  Ascending Thoracic Aorta:  36 x 36 mm  Aortic Arch:  25 x 23 mm  Descending Thoracic Aorta:  25 x 23 mm  Sinus of Valsalva Measurements:  36 x 32 mm  Coronary Artery Height above Annulus:  Left Main:  16 mm  Right Coronary:  14  mm  Virtual Basal Annulus Measurements:  Maximum/Minimum Diameter:  31.1 x 21.2 mm  Perimeter:  82.7 mm  Area:  501 mm2  Optimum Fluoroscopic Angle for Delivery:  LAO 2 CAU 1  IMPRESSION: 1. Bicuspid aortic valve with severely calcified leaflets and significant calcifications extending into the LVOT predominantly under the non-coronary leaflet. Annular measurements suitable for delivery of a 26 mm Edwards-SAPIEN 3 valve.  2.  Sufficient coronary to annulus distance.  3. Optimum Fluoroscopic Angle for Delivery:  LAO 2 CAU 1  4. No thrombus in the left atrial appendage.  Electronically Signed: By: Ena Dawley On: 09/08/2017 15:56   CT ANGIOGRAPHY CHEST, ABDOMEN AND PELVIS  TECHNIQUE: Multidetector  CT imaging through the chest, abdomen and pelvis was performed using the standard protocol during bolus administration of intravenous contrast. Multiplanar reconstructed images and MIPs were obtained and reviewed to evaluate the vascular anatomy.  CONTRAST:  80 cc Isovue 370 IV.  COMPARISON:  09/05/2017 chest radiograph. CT enterography of the abdomen and pelvis from 02/20/2008.  FINDINGS: CTA CHEST FINDINGS  Cardiovascular: Cardiomegaly with prominent dilatation of the left ventricle. No significant pericardial fluid/thickening. Left anterior descending, left circumflex and right coronary atherosclerosis. Aortic valve is coarsely calcified and thickened. Atherosclerotic nonaneurysmal thoracic aorta. No evidence of acute intramural hematoma, dissection, pseudoaneurysm or penetrating atherosclerotic ulcer in the thoracic aorta. Aortic arch branch vessels are patent. Normal caliber pulmonary arteries. No central pulmonary emboli. Left PICC terminates in the upper third of the superior vena cava.  Mediastinum/Nodes: No discrete thyroid nodules. Unremarkable esophagus. No pathologically enlarged axillary, mediastinal or hilar lymph  nodes.  Lungs/Pleura: No pneumothorax. Trace dependent bilateral pleural effusions. Moderate centrilobular emphysema with diffuse bronchial wall thickening. Mild dependent basilar bilateral lower lobe atelectasis, left greater than right. Otherwise no acute consolidative airspace disease or lung masses. Several scattered solid pulmonary nodules in both lungs, largest 7 mm in the peripheral left upper lobe (series 15/image 32).  Musculoskeletal: No aggressive appearing focal osseous lesions. Moderate thoracic spondylosis.  Review of the MIP images confirms the above findings.  CTA ABDOMEN AND PELVIS FINDINGS  Hepatobiliary: Normal liver size. There are a few scattered subcentimeter hyperdense foci throughout the liver, too small to characterize, for which no follow-up is required unless the patient has risk factors for liver malignancy. Normal gallbladder with no radiopaque cholelithiasis. No biliary ductal dilatation.  Pancreas: Normal, with no mass or duct dilation.  Spleen: Normal size. No mass.  Adrenals/Urinary Tract: Right adrenal 1.0 cm nodule, not convincingly changed since 02/20/2008 CT, compatible with a benign adenoma. No left adrenal nodules. Simple bilateral renal cysts, largest 4.8 cm in the lateral lower right kidney and 3.7 cm in the lower left kidney. Additional subcentimeter hypodense renal cortical lesions in both kidneys, too small to characterize, which require no follow-up. No hydronephrosis. Normal bladder.  Stomach/Bowel: Grossly normal stomach. Normal caliber small bowel with no small bowel wall thickening. Normal appendix. Minimal sigmoid diverticulosis, with no large bowel wall thickening or pericolonic fat stranding.  Vascular/Lymphatic: Atherosclerotic nonaneurysmal abdominal aorta. No pathologically enlarged lymph nodes in the abdomen or pelvis.  Reproductive: Normal size prostate.  Other: No pneumoperitoneum, ascites or focal  fluid collection.  Musculoskeletal: No aggressive appearing focal osseous lesions. Moderate lumbar spondylosis.  VASCULAR MEASUREMENTS PERTINENT TO TAVR:  AORTA:  Minimal Aortic Diameter -  15 x 13 mm (infrarenal abdominal aorta)  Severity of Aortic Calcification -  moderate  RIGHT PELVIS:  Right Common Iliac Artery -  Minimal Diameter - 10.3 x 6.6 mm  Tortuosity - mild-to-moderate  Calcification - mild  Right External Iliac Artery -  Minimal Diameter - 6.1 x 5.8 mm  Tortuosity - mild  Calcification - none  Right Common Femoral Artery -  Minimal Diameter - 6.8 x 6.4 mm  Tortuosity - mild  Calcification - none  LEFT PELVIS:  Left Common Iliac Artery -  Minimal Diameter - 7.7 x 5.1 mm  Tortuosity - mild-to-moderate  Calcification - mild  Left External Iliac Artery -  Minimal Diameter - 5.9 x 5.5 mm  Tortuosity - mild  Calcification - none  Left Common Femoral Artery -  Minimal Diameter - 6.6 x 5.9 mm  Tortuosity - mild  Calcification - none  Review of the MIP images confirms the above findings.  IMPRESSION: 1. Vascular findings and measurements pertinent to potential TAVR procedure, as detailed above. 2. Severe thickening and calcification of the aortic valve, compatible with the reported clinical history of symptomatic aortic stenosis. 3. Cardiomegaly.  Three-vessel coronary atherosclerosis. 4. Trace dependent bilateral pleural effusions with mild bibasilar atelectasis. 5. Aortic Atherosclerosis (ICD10-I70.0) and Emphysema (ICD10-J43.9). 6. Scattered bilateral solid pulmonary nodules, largest 7 mm in the left upper lobe. Non-contrast chest CT at 3-6 months is recommended. If the nodules are stable at time of repeat CT, then future CT at 18-24 months (from today's scan) is considered optional for low-risk patients, but is recommended for high-risk patients. This recommendation follows the consensus  statement: Guidelines for Management of Incidental Pulmonary Nodules Detected on CT Images: From the Fleischner Society 2017; Radiology 2017; 284:228-243. 7. Minimal sigmoid diverticulosis.   Electronically Signed   By: Ilona Sorrel M.D.   On: 09/11/2017 09:09   Pulmonary Function Tests  Baseline      Post-bronchodilator  FVC  4.16 L  (91% predicted) FVC  3.85 L  (84% predicted) FEV1  2.53 L  (75% predicted) FEV1  2.46 L  (72% predicted) FEF25-75 1.18 L  (44% predicted) FEF25-75 1.07 L  (40% predicted)  TLC  6.19 L  (88% predicted) RV  1.97 L  (82% predicted) DLCO  49% predicted   STS Risk Calculator Procedure: Isolated AVR CALCULATE  Risk of Mortality:  1.543%   Renal Failure:  1.060%   Permanent Stroke:  0.709%   Prolonged Ventilation:  12.143%   DSW Infection:  0.094%   Reoperation:  6.031%   Morbidity or Mortality:  17.678%   Short Length of Stay:  31.141%   Long Length of Stay:  5.284%      Impression:  Patient has stage D severe symptomatic low gradient low ejection fraction aortic stenosis.  He describes a slow gradual progression of symptoms of exertional shortness of breath and fatigue over the last few years.  He was hospitalized 6 weeks ago with acute hypoxic respiratory failure that developed in the setting of a viral pneumonia with acute exacerbation of chronic systolic congestive heart failure and COPD.  He currently describes stable symptoms of exertional shortness of breath and fatigue consistent with chronic systolic congestive heart failure, [place] Heart Association functional class IIb-III.  I have personally reviewed the patient's recent follow-up transthoracic echocardiogram, diagnostic cardiac catheterization, and CT angiograms.  Echocardiogram confirms the presence of bicuspid aortic valve with severe aortic stenosis with severe left ventricular systolic dysfunction.  Peak velocity across the aortic valve measured 3.2 m/s corresponding to mean  transvalvular gradient estimated 21 mmHg in the presence of underlying severe left ventricular systolic dysfunction with ejection fraction estimated 15-20%.  The DVI was very low at 0.13.  Diagnostic cardiac catheterization revealed mild nonobstructive coronary artery disease with question 50% proximal stenosis of the right coronary artery versus spasm.  At the time of catheterization pulmonary artery pressures were relatively low, measuring only 28/13 with pulmonary capillary wedge pressure 14 mmHg.  Cardiac output measured 3.7 L/min corresponding to cardiac index 1.9.  Mixed venous saturation ranged from 65-69%.  Cardiac gated CT angiography revealed no evidence for significant proximal stenosis of the right coronary artery and confirmed the presence of Sievers type 0 bicuspid aortic valve with severe aortic stenosis.  There was extensive bulky calcification extending through the aortic valve leaflets, with aortic annulus, and into the left  ventricular outflow tract.  CT angiography of the aorta and iliac vessels reveal no significant aneurysmal disease nor contraindications to peripheral cannulation for surgery or transfemoral access for transcatheter aortic valve replacement.  I suspect that the patient's underlying cardiomyopathy is likely related to long-standing severe aortic stenosis.  Patient's right heart catheterization findings were remarkably well compensated despite the fact that catheterization was performed so soon after the patient's acute presentation with respiratory failure.  Pulmonary function testing reveals only mild obstructive disease.  Despite the significant left ventricular systolic dysfunction, risks associated with conventional surgery should be relatively low.  The patient's aortic valve and aortic annulus appears relatively unfavorable for transcatheter aortic valve replacement with concerns for significant risk of paravalvular leak.     Plan:  The patient and his daughter  were counseled at length regarding treatment alternatives for management of severe symptomatic aortic stenosis. Alternative approaches such as conventional aortic valve replacement, transcatheter aortic valve replacement, and continued medical therapy were compared and contrasted at length.  The risks associated with conventional surgical aortic valve replacement were been discussed in detail, as were expectations for post-operative convalescence.  Surgical options were discussed at length including conventional surgical aortic valve replacement through either a full median sternotomy or using minimally invasive techniques.  Issues specific to transcatheter aortic valve replacement were discussed including questions about long term valve durability, the potential for paravalvular leak, possible increased risk of need for permanent pacemaker placement, and other technical complications related to the procedure itself.  Long-term prognosis with medical therapy was discussed. This discussion was placed in the context of the patient's own specific clinical presentation and past medical history.  All of their questions been addressed.  The patient desires to proceed with conventional surgical aortic valve replacement soon as practical.  He prefers a less invasive approach for surgery if feasible.  Discussion was held comparing the relative risks of mechanical valve replacement with need for lifelong anticoagulation versus use of a bioprosthetic tissue valve and the associated potential for late structural valve deterioration and failure.  This discussion was placed in the context of the patient's particular circumstances, and as a result the patient specifically requests that their valve be replaced using a bioprosthetic valve.   The patient understands and accepts all potential associated risks of surgery including but not limited to risk of death, stroke, myocardial infarction, congestive heart failure, respiratory  failure, renal failure, pneumonia, bleeding requiring blood transfusion and or reexploration, arrhythmia, heart block or bradycardia requiring permanent pacemaker, aortic dissection or other major vascular complication, pleural effusions or other delayed complications related to continued congestive heart failure, and other late complications related to valve replacement including structural valve deterioration and failure, thrombosis, endocarditis, or paravalvular leak.  Alternative surgical approaches have been discussed including a comparison between conventional sternotomy and minimally-invasive techniques.  The relative risks and benefits of each have been reviewed as they pertain to the patient's specific circumstances, and all of their questions have been addressed.  Specific risks potentially related to the minimally-invasive approach were discussed at length, including but not limited to risk of conversion to full or partial sternotomy, aortic dissection or other major vascular complication, unilateral acute lung injury or pulmonary edema, phrenic nerve dysfunction or paralysis, rib fracture, chronic pain, lung hernia, or lymphocele.   We plan to proceed with aortic valve replacement via right anterior minithoracotomy approach on Wednesday, October 18, 2017.   I spent in excess of 90 minutes during the conduct of this office consultation  and >50% of this time involved direct face-to-face encounter with the patient for counseling and/or coordination of their care.     Valentina Gu. Roxy Manns, MD 10/13/2017 10:04 AM

## 2017-10-13 NOTE — Patient Instructions (Signed)
Stop smoking immediately and permanently.  Continue taking all current medications without change through the day before surgery.  Have nothing to eat or drink after midnight the night before surgery.  On the morning of surgery take only Klonopin with a sip of water if needed

## 2017-10-16 NOTE — H&P (Signed)
[address]       ,[place] [address]             [phone]          CARDIOTHORACIC SURGERY HISTORY AND PHYSICAL EXAM  Referring Provider is Bensimhon, Shaune Pascal, MD PCP is Starlyn Skeans, PA-C      Chief Complaint  Patient presents with  . Aortic Stenosis    2nd evaluation    HPI:  Patient is a 66 year old male with long-standing history of heart murmur, recently discovered severe aortic stenosis, nonischemic cardiomyopathy, and COPD with ongoing tobacco abuse who has been referred for second surgical opinion to discuss treatment options for management of severe symptomatic aortic stenosis.  Patient states that he was told he had a heart murmur more than 20 years ago.  He was never formally evaluated by a cardiologist nor had he undergone transthoracic echocardiogram until recently.  He describes a long history of exertional shortness of breath and fatigue that has progressed slowly over the last few years.  Approximately 6 weeks ago he developed sudden onset of severe shortness of breath for which he presented acutely on September 01, 2017 in an acute hypoxemic respiratory failure requiring intubation and mechanical ventilation.  He was diagnosed with acute viral pneumonia secondary to parainfluenza virus complicated by the presence of underlying severe aortic stenosis with severe left ventricular systolic dysfunction and COPD.  During his hospitalization he underwent left and right heart catheterization on September 06, 2017 revealing mild nonobstructive coronary artery disease with relatively low filling pressures and mildly reduced cardiac output.  He was evaluated by Dr. Burt Knack from the multidisciplinary heart valve team and plans were made for delayed referral for surgical evaluation after CT angiography.  The patient was seen recently by Dr. Cyndia Bent who felt that the patient might best be treated with conventional surgery presuming that his cardiac function  had improved since hospital discharge.  The patient was referred for second surgical opinion.  Patient is divorced and lives alone near [place].  He has 2 adult daughters, 1 of whom lives next door, accompanies him for his consultation today and is very supportive.  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  He has remained functionally independent and reasonably active physically although he admits to slow gradual decline in his exercise tolerance for the last few years.  Since hospital discharge she has done well.  He states that he still gets short of breath with moderate level of exertion.  He denies any resting shortness of breath, PND, orthopnea, or lower extremity edema.  He has never had any chest pain or chest tightness either with activity or at rest.  His cough has nearly completely resolved since hospital discharge.  Unfortunately he continues to smoke cigarettes, currently 1/2 pack cigarettes daily.  His review of systems is also notable for problems with frequent heartburn which is made worse if he lays flat in bed at night.     Past Medical History:  Diagnosis Date  . Anxiety   . Aortic stenosis, severe    a. suspect low flow, low gradient severe AS.   Marland Kitchen COPD (chronic obstructive pulmonary disease) ([place])   . IBD (inflammatory bowel disease)    a. s/p partial colectomy in 2009  . LBBB (left bundle branch block)   . NICM (nonischemic cardiomyopathy) ([place])    a. 08/2014: EF 15% suspect to be  endstage CM 2/2 severe AS  . Tobacco abuse     Past Surgical History:  Procedure Laterality Date  . COLON SURGERY    . HERNIA REPAIR    . IR FLUORO GUIDE CV MIDLINE PICC LEFT  09/04/2017  . RIGHT/LEFT HEART CATH AND CORONARY ANGIOGRAPHY N/A 09/06/2017   Procedure: RIGHT/LEFT HEART CATH AND CORONARY ANGIOGRAPHY;  Surgeon: Jolaine Artist, MD;  Location: [facility];  Service: Cardiovascular;   Laterality: N/A;    Family History  Problem Relation Age of Onset  . Hypertension Father   . Heart attack Father   . Lung cancer Brother     Social History Social History   Tobacco Use  . Smoking status: Current Every Day Smoker    Packs/day: 2.00    Types: Cigarettes    Start date: 71  . Smokeless tobacco: Former Network engineer Use Topics  . Alcohol use: Yes    Comment: rarely  . Drug use: No    Prior to Admission medications   Medication Sig Start Date End Date Taking? Authorizing Provider  acetaminophen (TYLENOL) 500 MG tablet Take 500 mg by mouth every 6 (six) hours as needed.    [provider]  atorvastatin (LIPITOR) 20 MG tablet Take 1 tablet (20 mg total) by mouth daily at 6 PM. 10/05/17   Bensimhon, Shaune Pascal, MD  clonazePAM (KLONOPIN) 0.5 MG tablet Take 0.5 mg by mouth daily as needed for anxiety.  06/05/17   [provider]  digoxin (LANOXIN) 0.125 MG tablet Take 1 tablet (0.125 mg total) by mouth daily. 10/05/17   Bensimhon, Shaune Pascal, MD    No Known Allergies   Review of Systems:              General:                      normal appetite, decreased energy, no weight gain, + weight loss, no fever             Cardiac:                       no chest pain with exertion, no chest pain at rest, + SOB with exertion, no resting SOB, no PND, no orthopnea, no palpitations, no arrhythmia, no atrial fibrillation, no LE edema, no dizzy spells, no syncope             Respiratory:                 + shortness of breath, no home oxygen, no productive cough, + dry cough, no bronchitis, no wheezing, no hemoptysis, no asthma, no pain with inspiration or cough, no sleep apnea, no CPAP at night             GI:                               no difficulty swallowing, + reflux, + frequent heartburn, no hiatal hernia, no abdominal pain, no constipation, no diarrhea, no hematochezia, no hematemesis, no melena             GU:                              no dysuria,  no  frequency, no urinary tract infection, no hematuria, no enlarged prostate, no kidney stones, no  kidney disease             Vascular:                     no pain suggestive of claudication, no pain in feet, no leg cramps, no varicose veins, no DVT, no non-healing foot ulcer             Neuro:                         no stroke, no TIA's, no seizures, no headaches, no temporary blindness one eye,  no slurred speech, no peripheral neuropathy, no chronic pain, no instability of gait, no memory/cognitive dysfunction             Musculoskeletal:         no arthritis, no joint swelling, no myalgias, no difficulty walking, normal mobility              Skin:                            no rash, no itching, no skin infections, no pressure sores or ulcerations             Psych:                         + anxiety, no depression, + nervousness, + unusual recent stress             Eyes:                           no blurry vision, no floaters, no recent vision changes, + wears glasses or contacts             ENT:                            no hearing loss, no loose or painful teeth, no dentures, last saw dentist 07/2017             Hematologic:               no easy bruising, no abnormal bleeding, no clotting disorder, no frequent epistaxis             Endocrine:                   no diabetes, does not check CBG's at home                                                       Physical Exam:              BP 116/80   Pulse 86   Resp 20   Ht 5' 10"  (1.778 m)   Wt 168 lb (76.2 kg)   SpO2 98% Comment: on RA  BMI 24.11 kg/m              General:                        well-appearing             HEENT:  Unremarkable              Neck:                           no JVD, no bruits, no adenopathy              Chest:                          clear to auscultation, symmetrical breath sounds, no wheezes, no rhonchi              CV:                              RRR, grade III/VI  crescendo/decrescendo murmur heard best at RLSB,  no diastolic murmur             Abdomen:                    soft, non-tender, no masses              Extremities:                 warm, well-perfused, pulses palpable, no LE edema             Rectal/GU                   Deferred             Neuro:                         Grossly non-focal and symmetrical throughout             Skin:                            Clean and dry, no rashes, no breakdown   Diagnostic Tests:  Transthoracic Echocardiography  Patient: Kenneth Mills, Kenneth Mills MR #: [id number] Study Date: 10/09/2017 Gender: M Age: 20 Height: 177.8 cm Weight: 73.5 kg BSA: 1.91 m^2 Pt. Status: Room:  ATTENDING Dorris Carnes, M.D. SONOGRAPHER Diamond Nickel ORDERING Bensimhon, Glynis Smiles Bensimhon, Quillian Quince PERFORMING [facility], Outpatient  cc:  -------------------------------------------------------------------  ------------------------------------------------------------------- Indications: Z95.3 Status post TAVR.  ------------------------------------------------------------------- History: PMH: Left bundle branch block. Aortic stenosis. Congestive heart failure. Chronic obstructive pulmonary disease. Risk factors: Current tobacco use.  ------------------------------------------------------------------- Study Conclusions  - Left ventricle: LVEF is severely depressed at approximately 15 to 20% severe diffuse hypokinesis/akinesis Only the basal lateral wall thickens well. The cavity size was severely dilated. Wall thickness was increased in a pattern of mild LVH. - Aortic valve: AV is thickened, calcified with restricted motion. Peak and mean gradients through the valve are 50 and 24 mm Hg respectively. Velocity ratio is 0.14 consistent with severe AS. - Mitral valve: Moderately calcified annulus. Moderately thickened leaflets . There was  mild regurgitation.  ------------------------------------------------------------------- Study data: Comparison was made to the study of 09/02/2017. Study status: Routine. Procedure: The patient reported no pain pre or post test. Transthoracic echocardiography. Image quality was adequate. Study completion: There were no complications. Transthoracic echocardiography. M-mode, complete 2D, spectral Doppler, and color Doppler. Birthdate: Patient birthdate: 07/03/1951. Age: Patient is 66 yr old. Sex: Gender: male. BMI: 23.2 kg/m^2. Blood pressure: 110/78 Patient status: Outpatient. Study date: Study date: 10/09/2017. Study time: 10:17 AM. Location:  [facility]  -------------------------------------------------------------------  ------------------------------------------------------------------- Left ventricle: LVEF is severely depressed at approximately 15 to 20% severe diffuse hypokinesis/akinesis Only the basal lateral wall thickens well. The cavity size was severely dilated. Wall thickness was increased in a pattern of mild LVH.  ------------------------------------------------------------------- Aortic valve: AV is thickened, calcified with restricted motion. Peak and mean gradients through the valve are 50 and 24 mm Hg respectively. Velocity ratio is 0.14 consistent with severe AS. Doppler: VTI ratio of LVOT to aortic valve: 0.13. Peak velocity ratio of LVOT to aortic valve: 0.14. Mean velocity ratio of LVOT to aortic valve: 0.13. Mean gradient (S): 21 mm Hg. Peak gradient (S): 41 mm Hg.  ------------------------------------------------------------------- Mitral valve: Moderately calcified annulus. Moderately thickened leaflets . Doppler: There was mild regurgitation. Valve area by pressure half-time: 3.73 cm^2. Indexed valve area by pressure half-time: 1.96 cm^2/m^2. Mean gradient (D): 3 mm  Hg.  ------------------------------------------------------------------- Left atrium: The atrium was normal in size.  ------------------------------------------------------------------- Right ventricle: The cavity size was normal. Wall thickness was normal. Systolic function was normal.  ------------------------------------------------------------------- Pulmonic valve: Structurally normal valve. Cusp separation was normal. Doppler: Transvalvular velocity was within the normal range. There was mild regurgitation.  ------------------------------------------------------------------- Tricuspid valve: Structurally normal valve. Leaflet separation was normal. Doppler: Transvalvular velocity was within the normal range. There was trivial regurgitation.  ------------------------------------------------------------------- Right atrium: The atrium was normal in size.  ------------------------------------------------------------------- Pericardium: There was no pericardial effusion.  ------------------------------------------------------------------- Measurements  Left ventricle Value Reference LV ID, ED, PLAX chordal (H) 62.6 mm 43 - 52 LV ID, ES, PLAX chordal (H) 60.2 mm 23 - 38 LV fx shortening, PLAX chordal (L) 4 % >=29 LV PW thickness, ED 12.6 mm ---------- IVS/LV PW ratio, ED 1.01 <=1.3 LV e&', lateral 7.95 cm/s ---------- LV e&', medial 6.83 cm/s ---------- LV e&', average 7.39 cm/s ----------  Ventricular septum Value Reference IVS thickness, ED 12.7 mm ----------  LVOT Value Reference LVOT peak velocity, S  44.3 cm/s ---------- LVOT mean velocity, S 26.9 cm/s ---------- LVOT VTI, S 9.14 cm ----------  Aortic valve Value Reference Aortic valve peak velocity, S 321 cm/s ---------- Aortic valve mean velocity, S 202 cm/s ---------- Aortic valve VTI, S 69.8 cm ---------- Aortic mean gradient, S 21 mm Hg ---------- Aortic peak gradient, S 41 mm Hg ---------- VTI ratio, LVOT/AV 0.13 ---------- Velocity ratio, peak, LVOT/AV 0.14 ---------- Velocity ratio, mean, LVOT/AV 0.13 ----------  Left atrium Value Reference LA ID, A-P, ES 48 mm ---------- LA ID/bsa, A-P (H) 2.52 cm/m^2 <=2.2 LA volume, S 65 ml ---------- LA volume/bsa, S 34.1 ml/m^2 ---------- LA volume, ES, 1-p A4C 50.1 ml ---------- LA volume/bsa, ES, 1-p A4C 26.3 ml/m^2 ---------- LA volume, ES, 1-p A2C 76.9 ml ---------- LA volume/bsa, ES, 1-p A2C 40.3 ml/m^2 ----------  Mitral valve Value Reference Mitral mean velocity, D 67.7 cm/s ---------- Mitral pressure half-time 59 ms ---------- Mitral mean gradient, D 3 mm Hg ---------- Mitral valve area, PHT, DP 3.73 cm^2 ---------- Mitral valve area/bsa, PHT, DP 1.96 cm^2/m^2 ---------- Mitral annulus VTI, D 48.8 cm ---------- Mitral regurg VTI, PISA 196 cm ----------  Right atrium  Value Reference RA ID, S-I, ES, A4C (H) 49.9 mm 34 - 49 RA area, ES, A4C 13.3 cm^2 8.3 - 19.5 RA volume, ES, A/L 28.6 ml ---------- RA volume/bsa, ES, A/L 15 ml/m^2 ----------  Right ventricle Value Reference RV s&', lateral, S 11.6 cm/s ----------  Pulmonic valve Value Reference Pulmonic regurg velocity, ED 181 cm/s ---------- Pulmonic regurg gradient, ED  13 mm Hg ----------  Legend: (L) and (H) mark values outside specified reference range.  ------------------------------------------------------------------- Prepared and Electronically Authenticated by  Dorris Carnes, M.D. 2018-12-03T17:27:35    RIGHT/LEFT HEART CATH AND CORONARY ANGIOGRAPHY  Conclusion     Prox RCA lesion, 50 %stenosed.  Mid RCA lesion, 30 %stenosed.  Mid LAD lesion, 30 %stenosed.  Mid Cx to Dist Cx lesion, 20 %stenosed.  Findings:  Ao = 96/67 (81) LV 140/19 RA = 4 RV = 31/7 PA = 28/13 (21) PCW = 14 Fick cardiac output/index =3.7/1.9 PVR = 1.9 WU Ao sat = 97% PA sat = 69%, 65%  Aortic valve Peak gradient 80mHG Mean gradient 24 mmHG  Assessment: 1. Mild non-obstructive CAD with 50% proximal RCA with spasm 2. Severe NICM with EF 10-15% 3. Moderately reduced cardiac output 4. Likely low-gradient severe AS  Plan/Discussion:  Have discussed with TAVR team who will see him while inpatient to begin TAVR w/u.   Probable TAVR in 4-6 weeks after he recovers from parainfluenzae infection. With repeat echo prior.  Bensimhon, DQuillian Quince MD 9:33 AM      Indications   Nonrheumatic aortic valve stenosis [I35.0 (IATF-57-DU)] Acute systolic heart failure ([place] [I50.21 (ICD-10-CM)]  Procedural Details/Technique    Technical Details The risks and indication of the procedure were explained. Consent was signed and placed on the chart. An appropriate timeout was taken prior to the procedure.   The right AC fossa was prepped and draped in the routine sterile fashion and anesthetized with 1% local lidocaine. The pre-existing PIV in the [facility] over a wire for a 5 FR venous sheath using a modified Seldinger technique. A standard Swan-Ganz catheter was used for the procedure.   After a normal Allen's test was confirmed, the right wrist was prepped and draped in the routine sterile fashion and anesthetized with 1% local lidocaine. A 5 FR arterial sheath was then placed in the right radial artery using a modified Seldinger technique. 362mIV verapamil was given through the sheath. Systemic heparin was administered. Standard catheters including a JL 3.5 and a JR 4 were used. The aortic valve was crossed with a JR4 and a straight wire. All catheter exchanges were made over a wire.   Estimated blood loss <50 mL.  During this procedure the patient was administered the following to achieve and maintain moderate conscious sedation: Versed 1 mg, Fentanyl 25 mcg, while the patient's heart rate, blood pressure, and oxygen saturation were continuously monitored. The period of conscious sedation was 32 minutes, of which I was present face-to-face 100% of this time.  Coronary Findings   Diagnostic  Dominance: Right  Left Anterior Descending  Mid LAD lesion 30% stenosed  Mid LAD lesion.  Left Circumflex  Mid Cx to Dist Cx lesion 20% stenosed  Mid Cx to Dist Cx lesion.  Right Coronary Artery  Prox RCA lesion 50% stenosed  Prox RCA lesion.  Mid RCA lesion 30% stenosed  Mid RCA lesion.  Intervention   No interventions have been documented.  Wall Motion   EF 10-15%        Coronary Diagrams   Diagnostic Diagram       Implants        No implant documentation for this case.  MERGE  Images   Show images for CARDIAC CATHETERIZATION   Link to Procedure Log   Procedure Log    Hemo Data    Most Recent Value  Fick Cardiac Output 3.73 L/min  Fick  Cardiac Output Index 1.97 (L/min)/BSA  Aortic Mean Gradient 23.8 mmHg  Aortic Peak Gradient 34 mmHg  Aortic Valve Area 0.77  Aortic Value Area Index 0.41 cm2/BSA  RA A Wave 9 mmHg  RA V Wave 4 mmHg  RA Mean 4 mmHg  RV Systolic Pressure 31 mmHg  RV Diastolic Pressure 2 mmHg  RV EDP 7 mmHg  PA Systolic Pressure 28 mmHg  PA Diastolic Pressure 13 mmHg  PA Mean 21 mmHg  PW A Wave 18 mmHg  PW V Wave 18 mmHg  PW Mean 14 mmHg  AO Systolic Pressure 96 mmHg  AO Diastolic Pressure 67 mmHg  AO Mean 81 mmHg  LV Systolic Pressure 193 mmHg  LV Diastolic Pressure 12 mmHg  LV EDP 19 mmHg  Arterial Occlusion Pressure Extended Systolic Pressure 790 mmHg  Arterial Occlusion Pressure Extended Diastolic Pressure 72 mmHg  Arterial Occlusion Pressure Extended Mean Pressure 88 mmHg  Left Ventricular Apex Extended Systolic Pressure 240 mmHg  Left Ventricular Apex Extended Diastolic Pressure 9 mmHg  Left Ventricular Apex Extended EDP Pressure 19 mmHg  QP/QS 1  TPVR Index 10.65 HRUI  TSVR Index 41.07 HRUI  PVR SVR Ratio 0.09  TPVR/TSVR Ratio 0.26      Cardiac TAVR CT  TECHNIQUE: The patient was scanned on a [employer]. A 120 kV retrospective scan was triggered in the descending thoracic aorta at 111 HU's. Gantry rotation speed was 250 msecs and collimation was .6 mm. No beta blockade or nitro were given. The 3D data set was reconstructed in 5% intervals of the R-R cycle. Systolic and diastolic phases were analyzed on a dedicated work station using MPR, MIP and VRT modes. The patient received 80 cc of contrast.  FINDINGS: Aortic Valve: Bicuspid with severely calcified leaflets and significant calcifications extending into the LVOT predominantly under the non-coronary leaflet.  Aorta: Normal size, mild  diffuse calcifications.  Sinotubular Junction: 31 x 20 mm  Ascending Thoracic Aorta: 36 x 36 mm  Aortic Arch: 25 x 23 mm  Descending Thoracic Aorta: 25 x 23 mm  Sinus of Valsalva Measurements:  36 x 32 mm  Coronary Artery Height above Annulus:  Left Main: 16 mm  Right Coronary: 14 mm  Virtual Basal Annulus Measurements:  Maximum/Minimum Diameter: 31.1 x 21.2 mm  Perimeter: 82.7 mm  Area: 501 mm2  Optimum Fluoroscopic Angle for Delivery: LAO 2 CAU 1  IMPRESSION: 1. Bicuspid aortic valve with severely calcified leaflets and significant calcifications extending into the LVOT predominantly under the non-coronary leaflet. Annular measurements suitable for delivery of a 26 mm Edwards-SAPIEN 3 valve.  2. Sufficient coronary to annulus distance.  3. Optimum Fluoroscopic Angle for Delivery: LAO 2 CAU 1  4. No thrombus in the left atrial appendage.  Electronically Signed: By: Ena Dawley On: 09/08/2017 15:56   CT ANGIOGRAPHY CHEST, ABDOMEN AND PELVIS  TECHNIQUE: Multidetector CT imaging through the chest, abdomen and pelvis was performed using the standard protocol during bolus administration of intravenous contrast. Multiplanar reconstructed images and MIPs were obtained and reviewed to evaluate the vascular anatomy.  CONTRAST: 80 cc Isovue 370 IV.  COMPARISON: 09/05/2017 chest radiograph. CT enterography of the abdomen and pelvis from 02/20/2008.  FINDINGS: CTA CHEST FINDINGS  Cardiovascular: Cardiomegaly with prominent dilatation of the left ventricle. No significant pericardial fluid/thickening. Left anterior descending, left circumflex and right coronary atherosclerosis. Aortic valve is coarsely calcified and thickened. Atherosclerotic nonaneurysmal thoracic aorta. No evidence of acute intramural hematoma, dissection, pseudoaneurysm or penetrating atherosclerotic ulcer in the  thoracic aorta. Aortic arch  branch vessels are patent. Normal caliber pulmonary arteries. No central pulmonary emboli. Left PICC terminates in the upper third of the superior vena cava.  Mediastinum/Nodes: No discrete thyroid nodules. Unremarkable esophagus. No pathologically enlarged axillary, mediastinal or hilar lymph nodes.  Lungs/Pleura: No pneumothorax. Trace dependent bilateral pleural effusions. Moderate centrilobular emphysema with diffuse bronchial wall thickening. Mild dependent basilar bilateral lower lobe atelectasis, left greater than right. Otherwise no acute consolidative airspace disease or lung masses. Several scattered solid pulmonary nodules in both lungs, largest 7 mm in the peripheral left upper lobe (series 15/image 32).  Musculoskeletal: No aggressive appearing focal osseous lesions. Moderate thoracic spondylosis.  Review of the MIP images confirms the above findings.  CTA ABDOMEN AND PELVIS FINDINGS  Hepatobiliary: Normal liver size. There are a few scattered subcentimeter hyperdense foci throughout the liver, too small to characterize, for which no follow-up is required unless the patient has risk factors for liver malignancy. Normal gallbladder with no radiopaque cholelithiasis. No biliary ductal dilatation.  Pancreas: Normal, with no mass or duct dilation.  Spleen: Normal size. No mass.  Adrenals/Urinary Tract: Right adrenal 1.0 cm nodule, not convincingly changed since 02/20/2008 CT, compatible with a benign adenoma. No left adrenal nodules. Simple bilateral renal cysts, largest 4.8 cm in the lateral lower right kidney and 3.7 cm in the lower left kidney. Additional subcentimeter hypodense renal cortical lesions in both kidneys, too small to characterize, which require no follow-up. No hydronephrosis. Normal bladder.  Stomach/Bowel: Grossly normal stomach. Normal caliber small bowel with no small bowel wall thickening. Normal appendix. Minimal sigmoid  diverticulosis, with no large bowel wall thickening or pericolonic fat stranding.  Vascular/Lymphatic: Atherosclerotic nonaneurysmal abdominal aorta. No pathologically enlarged lymph nodes in the abdomen or pelvis.  Reproductive: Normal size prostate.  Other: No pneumoperitoneum, ascites or focal fluid collection.  Musculoskeletal: No aggressive appearing focal osseous lesions. Moderate lumbar spondylosis.  VASCULAR MEASUREMENTS PERTINENT TO TAVR:  AORTA:  Minimal Aortic Diameter - 15 x 13 mm (infrarenal abdominal aorta)  Severity of Aortic Calcification - moderate  RIGHT PELVIS:  Right Common Iliac Artery -  Minimal Diameter - 10.3 x 6.6 mm  Tortuosity - mild-to-moderate  Calcification - mild  Right External Iliac Artery -  Minimal Diameter - 6.1 x 5.8 mm  Tortuosity - mild  Calcification - none  Right Common Femoral Artery -  Minimal Diameter - 6.8 x 6.4 mm  Tortuosity - mild  Calcification - none  LEFT PELVIS:  Left Common Iliac Artery -  Minimal Diameter - 7.7 x 5.1 mm  Tortuosity - mild-to-moderate  Calcification - mild  Left External Iliac Artery -  Minimal Diameter - 5.9 x 5.5 mm  Tortuosity - mild  Calcification - none  Left Common Femoral Artery -  Minimal Diameter - 6.6 x 5.9 mm  Tortuosity - mild  Calcification - none  Review of the MIP images confirms the above findings.  IMPRESSION: 1. Vascular findings and measurements pertinent to potential TAVR procedure, as detailed above. 2. Severe thickening and calcification of the aortic valve, compatible with the reported clinical history of symptomatic aortic stenosis. 3. Cardiomegaly. Three-vessel coronary atherosclerosis. 4. Trace dependent bilateral pleural effusions with mild bibasilar atelectasis. 5. Aortic Atherosclerosis (ICD10-I70.0) and Emphysema (ICD10-J43.9). 6. Scattered bilateral solid pulmonary nodules, largest 7 mm in  the left upper lobe. Non-contrast chest CT at 3-6 months is recommended. If the nodules are stable at time of repeat CT, then future CT at 18-24 months (from today's scan)  is considered optional for low-risk patients, but is recommended for high-risk patients. This recommendation follows the consensus statement: Guidelines for Management of Incidental Pulmonary Nodules Detected on CT Images: From the Fleischner Society 2017; Radiology 2017; 284:228-243. 7. Minimal sigmoid diverticulosis.   Electronically Signed By: Ilona Sorrel M.D. On: 09/11/2017 09:09   Pulmonary Function Tests  Baseline                                                                      Post-bronchodilator  FVC                 4.16 L  (91% predicted)          FVC                 3.85 L  (84% predicted) FEV1               2.53 L  (75% predicted)          FEV1               2.46 L  (72% predicted) FEF25-75        1.18 L  (44% predicted)          FEF25-75        1.07 L  (40% predicted)  TLC                 6.19 L  (88% predicted) RV                   1.97 L  (82% predicted) DLCO              49% predicted   STS Risk Calculator Procedure: Isolated AVR CALCULATE  Risk of Mortality:  1.543%   Renal Failure:  1.060%   Permanent Stroke:  0.709%   Prolonged Ventilation:  12.143%   DSW Infection:  0.094%   Reoperation:  6.031%   Morbidity or Mortality:  17.678%   Short Length of Stay:  31.141%   Long Length of Stay:  5.284%      Impression:  Patient has stage D severe symptomatic low gradient low ejection fraction aortic stenosis.  He describes a slow gradual progression of symptoms of exertional shortness of breath and fatigue over the last few years.  He was hospitalized 6 weeks ago with acute hypoxic respiratory failure that developed in the setting of a viral pneumonia with acute exacerbation of chronic systolic congestive heart failure and COPD.  He currently describes stable symptoms of  exertional shortness of breath and fatigue consistent with chronic systolic congestive heart failure, New York Heart Association functional class IIb-III.  I have personally reviewed the patient's recent follow-up transthoracic echocardiogram, diagnostic cardiac catheterization, and CT angiograms.  Echocardiogram confirms the presence of bicuspid aortic valve with severe aortic stenosis with severe left ventricular systolic dysfunction.  Peak velocity across the aortic valve measured 3.2 m/s corresponding to mean transvalvular gradient estimated 21 mmHg in the presence of underlying severe left ventricular systolic dysfunction with ejection fraction estimated 15-20%.  The DVI was very low at 0.13.  Diagnostic cardiac catheterization revealed mild nonobstructive coronary artery disease with question 50% proximal stenosis of the right coronary artery versus spasm.  At  the time of catheterization pulmonary artery pressures were relatively low, measuring only 28/13 with pulmonary capillary wedge pressure 14 mmHg.  Cardiac output measured 3.7 L/min corresponding to cardiac index 1.9.  Mixed venous saturation ranged from 65-69%.  Cardiac gated CT angiography revealed no evidence for significant proximal stenosis of the right coronary artery and confirmed the presence of Sievers type 0 bicuspid aortic valve with severe aortic stenosis.  There was extensive bulky calcification extending through the aortic valve leaflets, with aortic annulus, and into the left ventricular outflow tract.  CT angiography of the aorta and iliac vessels reveal no significant aneurysmal disease nor contraindications to peripheral cannulation for surgery or transfemoral access for transcatheter aortic valve replacement.  I suspect that the patient's underlying cardiomyopathy is likely related to long-standing severe aortic stenosis.  Patient's right heart catheterization findings were remarkably well compensated despite the fact that  catheterization was performed so soon after the patient's acute presentation with respiratory failure.  Pulmonary function testing reveals only mild obstructive disease.  Despite the significant left ventricular systolic dysfunction, risks associated with conventional surgery should be relatively low.  The patient's aortic valve and aortic annulus appears relatively unfavorable for transcatheter aortic valve replacement with concerns for significant risk of paravalvular leak.     Plan:  The patient and his daughter were counseled at length regarding treatment alternatives for management of severe symptomatic aortic stenosis. Alternative approaches such as conventional aortic valve replacement, transcatheter aortic valve replacement, and continued medical therapy were compared and contrasted at length.  The risks associated with conventional surgical aortic valve replacement were been discussed in detail, as were expectations for post-operative convalescence.  Surgical options were discussed at length including conventional surgical aortic valve replacement through either a full median sternotomy or using minimally invasive techniques.  Issues specific to transcatheter aortic valve replacement were discussed including questions about long term valve durability, the potential for paravalvular leak, possible increased risk of need for permanent pacemaker placement, and other technical complications related to the procedure itself.  Long-term prognosis with medical therapy was discussed. This discussion was placed in the context of the patient's own specific clinical presentation and past medical history.  All of their questions been addressed.  The patient desires to proceed with conventional surgical aortic valve replacement soon as practical.  He prefers a less invasive approach for surgery if feasible.  Discussion was held comparing the relative risks of mechanical valve replacement with need for  lifelong anticoagulation versus use of a bioprosthetic tissue valve and the associated potential for late structural valve deterioration and failure.  This discussion was placed in the context of the patient's particular circumstances, and as a result the patient specifically requests that their valve be replaced using a bioprosthetic valve.   The patient understands and accepts all potential associated risks of surgery including but not limited to risk of death, stroke, myocardial infarction, congestive heart failure, respiratory failure, renal failure, pneumonia, bleeding requiring blood transfusion and or reexploration, arrhythmia, heart block or bradycardia requiring permanent pacemaker, aortic dissection or other major vascular complication, pleural effusions or other delayed complications related to continued congestive heart failure, and other late complications related to valve replacement including structural valve deterioration and failure, thrombosis, endocarditis, or paravalvular leak.  Alternative surgical approaches have been discussed including a comparison between conventional sternotomy and minimally-invasive techniques.  The relative risks and benefits of each have been reviewed as they pertain to the patient's specific circumstances, and all of their questions have been addressed.  Specific risks potentially related to the minimally-invasive approach were discussed at length, including but not limited to risk of conversion to full or partial sternotomy, aortic dissection or other major vascular complication, unilateral acute lung injury or pulmonary edema, phrenic nerve dysfunction or paralysis, rib fracture, chronic pain, lung hernia, or lymphocele.   We plan to proceed with aortic valve replacement via right anterior minithoracotomy approach on Wednesday, October 18, 2017.    Valentina Gu. Roxy Manns, MD 10/13/2017 10:04 AM

## 2017-10-16 NOTE — Pre-Procedure Instructions (Signed)
Kenneth Mills  10/16/2017      [facility] [place], [place] [address] [place] [address] Phone: [phone] Fax: [phone]    Your procedure is scheduled on Wed. Dec. 12  Report to [facility] Admitting at 6:30 A.M.  Call this number if you have problems the morning of surgery:  [phone]   Remember:  Do not eat food or drink liquids after midnight on Tues. Dec. 11   Take these medicines the morning of surgery with A SIP OF WATER : tylenol if needed,clonazepam (klonopin), digoxin (lanoxin)             7 days prior to surgery STOP taking any Aspirin(unless otherwise instructed by your surgeon), Aleve, Naproxen, Ibuprofen, Motrin, Advil, Goody's, BC's, all herbal medications, fish oil, and all vitamins   Do not wear jewelry, make-up or nail polish.  Do not wear lotions, powders, or perfumes, or deodorant.  Do not shave 48 hours prior to surgery.  Men may shave face and neck.  Do not bring valuables to the hospital.  [facility] is not responsible for any belongings or valuables.  Contacts, dentures or bridgework may not be worn into surgery.  Leave your suitcase in the car.  After surgery it may be brought to your room.  For patients admitted to the hospital, discharge time will be determined by your treatment team.  Patients discharged the day of surgery will not be allowed to drive home.    Special instructions:   [place]- Preparing For Surgery  Before surgery, you can play an important role. Because skin is not sterile, your skin needs to be as free of germs as possible. You can reduce the number of germs on your skin by washing with CHG (chlorahexidine gluconate) Soap before surgery.  CHG is an antiseptic cleaner which kills germs and bonds with the skin to continue killing germs even after washing.  Please do not use if you have an allergy to CHG or antibacterial soaps. If your skin  becomes reddened/irritated stop using the CHG.  Do not shave (including legs and underarms) for at least 48 hours prior to first CHG shower. It is OK to shave your face.  Please follow these instructions carefully.   1. Shower the NIGHT BEFORE SURGERY and the MORNING OF SURGERY with CHG.   2. If you chose to wash your hair, wash your hair first as usual with your normal shampoo.  3. After you shampoo, rinse your hair and body thoroughly to remove the shampoo.  4. Use CHG as you would any other liquid soap. You can apply CHG directly to the skin and wash gently with a scrungie or a clean washcloth.   5. Apply the CHG Soap to your body ONLY FROM THE NECK DOWN.  Do not use on open wounds or open sores. Avoid contact with your eyes, ears, mouth and genitals (private parts). Wash Face and genitals (private parts)  with your normal soap.  6. Wash thoroughly, paying special attention to the area where your surgery will be performed.  7. Thoroughly rinse your body with warm water from the neck down.  8. DO NOT shower/wash with your normal soap after using and rinsing off the CHG Soap.  9. Pat yourself dry with a CLEAN TOWEL.  10. Wear CLEAN PAJAMAS to bed the night before surgery, wear comfortable clothes the morning of surgery  11. Place CLEAN SHEETS  on your bed the night of your first shower and DO NOT SLEEP WITH PETS.    Day of Surgery: Do not apply any deodorants/lotions. Please wear clean clothes to the hospital/surgery center.      Please read over the following fact sheets that you were given. Coughing and Deep Breathing, MRSA Information and Surgical Site Infection Prevention

## 2017-10-17 ENCOUNTER — Other Ambulatory Visit (HOSPITAL_COMMUNITY): Payer: Self-pay | Admitting: *Deleted

## 2017-10-17 ENCOUNTER — Ambulatory Visit (HOSPITAL_COMMUNITY)
Admission: RE | Admit: 2017-10-17 | Discharge: 2017-10-17 | Disposition: A | Discharge status: Home / Self Care | Payer: Medicare HMO | Source: Ambulatory Visit | Attending: Thoracic Surgery (Cardiothoracic Vascular Surgery) | Admitting: Thoracic Surgery (Cardiothoracic Vascular Surgery)

## 2017-10-17 ENCOUNTER — Ambulatory Visit (HOSPITAL_BASED_OUTPATIENT_CLINIC_OR_DEPARTMENT_OTHER)
Admission: RE | Admit: 2017-10-17 | Discharge: 2017-10-17 | Disposition: A | Discharge status: Home / Self Care | Payer: Medicare HMO | Source: Ambulatory Visit | Attending: Thoracic Surgery (Cardiothoracic Vascular Surgery) | Admitting: Thoracic Surgery (Cardiothoracic Vascular Surgery)

## 2017-10-17 ENCOUNTER — Encounter (HOSPITAL_COMMUNITY)
Admission: RE | Admit: 2017-10-17 | Discharge: 2017-10-17 | Disposition: A | Discharge status: Home / Self Care | Payer: Medicare HMO | Source: Ambulatory Visit | Attending: Thoracic Surgery (Cardiothoracic Vascular Surgery) | Admitting: Thoracic Surgery (Cardiothoracic Vascular Surgery)

## 2017-10-17 ENCOUNTER — Encounter (HOSPITAL_COMMUNITY): Payer: Self-pay

## 2017-10-17 ENCOUNTER — Other Ambulatory Visit: Payer: Self-pay

## 2017-10-17 DIAGNOSIS — J9811 Atelectasis: Secondary | ICD-10-CM | POA: Diagnosis not present

## 2017-10-17 DIAGNOSIS — Z79899 Other long term (current) drug therapy: Secondary | ICD-10-CM | POA: Diagnosis not present

## 2017-10-17 DIAGNOSIS — I350 Nonrheumatic aortic (valve) stenosis: Secondary | ICD-10-CM

## 2017-10-17 DIAGNOSIS — Z01812 Encounter for preprocedural laboratory examination: Secondary | ICD-10-CM | POA: Insufficient documentation

## 2017-10-17 DIAGNOSIS — K219 Gastro-esophageal reflux disease without esophagitis: Secondary | ICD-10-CM | POA: Diagnosis present

## 2017-10-17 DIAGNOSIS — D6959 Other secondary thrombocytopenia: Secondary | ICD-10-CM | POA: Diagnosis not present

## 2017-10-17 DIAGNOSIS — Z801 Family history of malignant neoplasm of trachea, bronchus and lung: Secondary | ICD-10-CM | POA: Diagnosis not present

## 2017-10-17 DIAGNOSIS — J90 Pleural effusion, not elsewhere classified: Secondary | ICD-10-CM | POA: Diagnosis not present

## 2017-10-17 DIAGNOSIS — Z01818 Encounter for other preprocedural examination: Secondary | ICD-10-CM

## 2017-10-17 DIAGNOSIS — I080 Rheumatic disorders of both mitral and aortic valves: Secondary | ICD-10-CM | POA: Diagnosis not present

## 2017-10-17 DIAGNOSIS — I700 Atherosclerosis of aorta: Secondary | ICD-10-CM

## 2017-10-17 DIAGNOSIS — J449 Chronic obstructive pulmonary disease, unspecified: Secondary | ICD-10-CM | POA: Diagnosis present

## 2017-10-17 DIAGNOSIS — F17210 Nicotine dependence, cigarettes, uncomplicated: Secondary | ICD-10-CM | POA: Diagnosis present

## 2017-10-17 DIAGNOSIS — I472 Ventricular tachycardia: Secondary | ICD-10-CM | POA: Diagnosis not present

## 2017-10-17 DIAGNOSIS — F419 Anxiety disorder, unspecified: Secondary | ICD-10-CM | POA: Diagnosis present

## 2017-10-17 DIAGNOSIS — I2510 Atherosclerotic heart disease of native coronary artery without angina pectoris: Secondary | ICD-10-CM | POA: Diagnosis present

## 2017-10-17 DIAGNOSIS — I440 Atrioventricular block, first degree: Secondary | ICD-10-CM | POA: Diagnosis not present

## 2017-10-17 DIAGNOSIS — D62 Acute posthemorrhagic anemia: Secondary | ICD-10-CM | POA: Diagnosis not present

## 2017-10-17 DIAGNOSIS — I358 Other nonrheumatic aortic valve disorders: Secondary | ICD-10-CM | POA: Diagnosis not present

## 2017-10-17 DIAGNOSIS — I5021 Acute systolic (congestive) heart failure: Secondary | ICD-10-CM | POA: Diagnosis not present

## 2017-10-17 DIAGNOSIS — I5022 Chronic systolic (congestive) heart failure: Secondary | ICD-10-CM | POA: Diagnosis not present

## 2017-10-17 DIAGNOSIS — R001 Bradycardia, unspecified: Secondary | ICD-10-CM | POA: Diagnosis not present

## 2017-10-17 DIAGNOSIS — I429 Cardiomyopathy, unspecified: Secondary | ICD-10-CM | POA: Diagnosis present

## 2017-10-17 DIAGNOSIS — R7303 Prediabetes: Secondary | ICD-10-CM | POA: Diagnosis present

## 2017-10-17 DIAGNOSIS — I5023 Acute on chronic systolic (congestive) heart failure: Secondary | ICD-10-CM | POA: Diagnosis present

## 2017-10-17 DIAGNOSIS — Z953 Presence of xenogenic heart valve: Secondary | ICD-10-CM | POA: Diagnosis not present

## 2017-10-17 DIAGNOSIS — I447 Left bundle-branch block, unspecified: Secondary | ICD-10-CM | POA: Diagnosis not present

## 2017-10-17 DIAGNOSIS — R931 Abnormal findings on diagnostic imaging of heart and coronary circulation: Secondary | ICD-10-CM | POA: Diagnosis not present

## 2017-10-17 DIAGNOSIS — I340 Nonrheumatic mitral (valve) insufficiency: Secondary | ICD-10-CM | POA: Diagnosis present

## 2017-10-17 DIAGNOSIS — Z01810 Encounter for preprocedural cardiovascular examination: Secondary | ICD-10-CM

## 2017-10-17 DIAGNOSIS — Z8249 Family history of ischemic heart disease and other diseases of the circulatory system: Secondary | ICD-10-CM | POA: Diagnosis not present

## 2017-10-17 DIAGNOSIS — Z9049 Acquired absence of other specified parts of digestive tract: Secondary | ICD-10-CM | POA: Diagnosis not present

## 2017-10-17 DIAGNOSIS — I5043 Acute on chronic combined systolic (congestive) and diastolic (congestive) heart failure: Secondary | ICD-10-CM | POA: Diagnosis not present

## 2017-10-17 DIAGNOSIS — K529 Noninfective gastroenteritis and colitis, unspecified: Secondary | ICD-10-CM | POA: Diagnosis present

## 2017-10-17 DIAGNOSIS — R918 Other nonspecific abnormal finding of lung field: Secondary | ICD-10-CM | POA: Diagnosis present

## 2017-10-17 HISTORY — DX: Pneumonia, unspecified organism: J18.9

## 2017-10-17 HISTORY — DX: Cardiac murmur, unspecified: R01.1

## 2017-10-17 LAB — COMPREHENSIVE METABOLIC PANEL
ALT: 16 U/L — AB (ref 17–63)
ANION GAP: 10 (ref 5–15)
AST: 22 U/L (ref 15–41)
Albumin: 4.1 g/dL (ref 3.5–5.0)
Alkaline Phosphatase: 69 U/L (ref 38–126)
BUN: 13 mg/dL (ref 6–20)
CHLORIDE: 108 mmol/L (ref 101–111)
CO2: 19 mmol/L — AB (ref 22–32)
CREATININE: 0.92 mg/dL (ref 0.61–1.24)
Calcium: 9.2 mg/dL (ref 8.9–10.3)
Glucose, Bld: 113 mg/dL — ABNORMAL HIGH (ref 65–99)
POTASSIUM: 4.1 mmol/L (ref 3.5–5.1)
SODIUM: 137 mmol/L (ref 135–145)
Total Bilirubin: 1.2 mg/dL (ref 0.3–1.2)
Total Protein: 6.6 g/dL (ref 6.5–8.1)

## 2017-10-17 LAB — BLOOD GAS, ARTERIAL
ACID-BASE DEFICIT: 0.8 mmol/L (ref 0.0–2.0)
Bicarbonate: 22.9 mmol/L (ref 20.0–28.0)
Drawn by: 470591
FIO2: 21
O2 SAT: 97.8 %
PATIENT TEMPERATURE: 98.6
PCO2 ART: 34.7 mmHg (ref 32.0–48.0)
PO2 ART: 104 mmHg (ref 83.0–108.0)
pH, Arterial: 7.433 (ref 7.350–7.450)

## 2017-10-17 LAB — TYPE AND SCREEN
ABO/RH(D): O POS
Antibody Screen: NEGATIVE

## 2017-10-17 LAB — URINALYSIS, ROUTINE W REFLEX MICROSCOPIC
BILIRUBIN URINE: NEGATIVE
Glucose, UA: NEGATIVE mg/dL
Hgb urine dipstick: NEGATIVE
Ketones, ur: NEGATIVE mg/dL
Leukocytes, UA: NEGATIVE
NITRITE: NEGATIVE
Protein, ur: NEGATIVE mg/dL
SPECIFIC GRAVITY, URINE: 1.005 (ref 1.005–1.030)
pH: 5 (ref 5.0–8.0)

## 2017-10-17 LAB — CBC
HCT: 47.6 % (ref 39.0–52.0)
Hemoglobin: 16.2 g/dL (ref 13.0–17.0)
MCH: 31.8 pg (ref 26.0–34.0)
MCHC: 34 g/dL (ref 30.0–36.0)
MCV: 93.5 fL (ref 78.0–100.0)
PLATELETS: 130 10*3/uL — AB (ref 150–400)
RBC: 5.09 MIL/uL (ref 4.22–5.81)
RDW: 14.4 % (ref 11.5–15.5)
WBC: 8.5 10*3/uL (ref 4.0–10.5)

## 2017-10-17 LAB — HEMOGLOBIN A1C
HEMOGLOBIN A1C: 6 % — AB (ref 4.8–5.6)
MEAN PLASMA GLUCOSE: 125.5 mg/dL

## 2017-10-17 LAB — SURGICAL PCR SCREEN
MRSA, PCR: NEGATIVE
STAPHYLOCOCCUS AUREUS: NEGATIVE

## 2017-10-17 MED ORDER — PLASMA-LYTE 148 IV SOLN
INTRAVENOUS | Status: DC
  Filled 2017-10-17: qty 2.5

## 2017-10-17 MED ORDER — NITROGLYCERIN IN D5W 200-5 MCG/ML-% IV SOLN
2.0000 ug/min | INTRAVENOUS | Status: DC
  Filled 2017-10-17: qty 250

## 2017-10-17 MED ORDER — POTASSIUM CHLORIDE 2 MEQ/ML IV SOLN
80.0000 meq | INTRAVENOUS | Status: DC
  Filled 2017-10-17: qty 40

## 2017-10-17 MED ORDER — KENNESTONE BLOOD CARDIOPLEGIA (KBC) MANNITOL SYRINGE (20%, 32ML)
32.0000 mL | INTRAVENOUS | Status: DC
  Filled 2017-10-17: qty 1

## 2017-10-17 MED ORDER — DEXMEDETOMIDINE HCL IN NACL 400 MCG/100ML IV SOLN
0.1000 ug/kg/h | INTRAVENOUS | Status: AC
  Administered 2017-10-18: .5 ug/kg/h via INTRAVENOUS
  Filled 2017-10-17: qty 100

## 2017-10-17 MED ORDER — DEXTROSE 5 % IV SOLN
1.5000 g | INTRAVENOUS | Status: AC
  Administered 2017-10-18: 1.5 g via INTRAVENOUS
  Filled 2017-10-17: qty 1.5

## 2017-10-17 MED ORDER — SODIUM CHLORIDE 0.9 % IV SOLN
INTRAVENOUS | Status: DC
  Filled 2017-10-17: qty 30

## 2017-10-17 MED ORDER — SODIUM CHLORIDE 0.9 % IV SOLN
30.0000 ug/min | INTRAVENOUS | Status: AC
  Administered 2017-10-18: 20 ug/min via INTRAVENOUS
  Filled 2017-10-17: qty 2

## 2017-10-17 MED ORDER — SODIUM CHLORIDE 0.9 % IV SOLN
INTRAVENOUS | Status: AC
  Administered 2017-10-18: 1.3 [IU]/h via INTRAVENOUS
  Filled 2017-10-17: qty 1

## 2017-10-17 MED ORDER — DOPAMINE-DEXTROSE 3.2-5 MG/ML-% IV SOLN
0.0000 ug/kg/min | INTRAVENOUS | Status: DC
  Filled 2017-10-17: qty 250

## 2017-10-17 MED ORDER — KENNESTONE BLOOD CARDIOPLEGIA VIAL
13.0000 mL | Status: DC
  Filled 2017-10-17: qty 1

## 2017-10-17 MED ORDER — VANCOMYCIN HCL 1000 MG IV SOLR
INTRAVENOUS | Status: AC
  Administered 2017-10-18: 10:00:00
  Filled 2017-10-17: qty 1000

## 2017-10-17 MED ORDER — MAGNESIUM SULFATE 50 % IJ SOLN
40.0000 meq | INTRAMUSCULAR | Status: DC
  Filled 2017-10-17: qty 9.85

## 2017-10-17 MED ORDER — SODIUM CHLORIDE 0.9 % IV SOLN
1.5000 mg/kg/h | INTRAVENOUS | Status: AC
  Administered 2017-10-18: 1.5 mg/kg/h via INTRAVENOUS
  Filled 2017-10-17: qty 25

## 2017-10-17 MED ORDER — EPINEPHRINE PF 1 MG/ML IJ SOLN
0.0000 ug/min | INTRAMUSCULAR | Status: DC
  Filled 2017-10-17: qty 4

## 2017-10-17 MED ORDER — TRANEXAMIC ACID (OHS) PUMP PRIME SOLUTION
2.0000 mg/kg | INTRAVENOUS | Status: DC
  Filled 2017-10-17: qty 1.52

## 2017-10-17 MED ORDER — VANCOMYCIN HCL 10 G IV SOLR
1250.0000 mg | INTRAVENOUS | Status: AC
  Administered 2017-10-18: 1250 mg via INTRAVENOUS
  Filled 2017-10-17 (×2): qty 1250

## 2017-10-17 MED ORDER — DEXTROSE 5 % IV SOLN
750.0000 mg | INTRAVENOUS | Status: DC
  Filled 2017-10-17: qty 750

## 2017-10-17 MED ORDER — TRANEXAMIC ACID (OHS) BOLUS VIA INFUSION
15.0000 mg/kg | INTRAVENOUS | Status: AC
  Administered 2017-10-18: 1143 mg via INTRAVENOUS
  Administered 2017-10-18: 150 mg via INTRAVENOUS
  Filled 2017-10-17: qty 1143

## 2017-10-17 NOTE — Progress Notes (Addendum)
Pre AVR Vascular workup.  Carotid -completed 09/07/2017  Right Palmer Arch:waveform increases less than 50% with radial compression and reverses with ulnar compression.  Left Palmer Arch:waveform obliterates with radial compression and is unchanged with ulnar compression.   Bilateral resting ABI's are within normal range.  Lita Mains- RDMS, RVT 3:23 PM  10/17/2017

## 2017-10-17 NOTE — Progress Notes (Addendum)
Pt's only heart history is the Aortic stenosis. He states he has noticed sob and heaviness in his chest with exertion. Pt denies any other cardiac history. States he is not diabetic. Positive Stop Bang Assessment sent to pt's PCP

## 2017-10-17 NOTE — Pre-Procedure Instructions (Addendum)
   Kenneth Mills  10/17/2017    Your procedure is scheduled on Wednesday, October 18, 2017 at 8:30 AM.   Report to [facility] Entrance "A" Admitting Office at 6:30 A.M.   Call this number if you have problems the morning of surgery: [phone]    Remember:  Do not eat food or drink liquids after midnight tonight.  Take these medicines the morning of surgery with A SIP OF WATER: Digoxin (Lanoxin), Clonazepam (Klonopin) - if needed, Tylenol - if needed.  No smoking as of now until after surgery.        Do not wear jewelry.  Do not wear lotions, powders, cologne or deodorant.  Men may shave face and neck.  Do not bring valuables to the hospital.  [facility] is not responsible for any belongings or valuables.  Contacts, dentures or bridgework may not be worn into surgery.  Leave your suitcase in the car.  After surgery it may be brought to your room.  For patients admitted to the hospital, discharge time will be determined by your treatment team.  [facility]- Preparing For Surgery  Before surgery, you can play an important role. Because skin is not sterile, your skin needs to be as free of germs as possible. You can reduce the number of germs on your skin by washing with CHG (chlorahexidine gluconate) Soap before surgery.  CHG is an antiseptic cleaner which kills germs and bonds with the skin to continue killing germs even after washing.  Please do not use if you have an allergy to CHG or antibacterial soaps. If your skin becomes reddened/irritated stop using the CHG.  Do not shave (including legs and underarms) for at least 48 hours prior to first CHG shower. It is OK to shave your face.  Please follow these instructions carefully.   1. Shower the NIGHT BEFORE SURGERY and the MORNING OF SURGERY with CHG.   2. If you chose to wash your hair, wash your hair first as usual with your normal shampoo.  3. After you shampoo, rinse your hair and body thoroughly to remove  the shampoo.  4. Use CHG as you would any other liquid soap. You can apply CHG directly to the skin and wash gently with a scrungie or a clean washcloth.   5. Apply the CHG Soap to your body ONLY FROM THE NECK DOWN.  Do not use on open wounds or open sores. Avoid contact with your eyes, ears, mouth and genitals (private parts). Wash Face and genitals (private parts)  with your normal soap.  6. Wash thoroughly, paying special attention to the area where your surgery will be performed.  7. Thoroughly rinse your body with warm water from the neck down.  8. DO NOT shower/wash with your normal soap after using and rinsing off the CHG Soap.  9. Pat yourself dry with a CLEAN TOWEL.  10. Wear CLEAN PAJAMAS to bed the night before surgery.  11. Place CLEAN SHEETS on your bed the night of your first shower and DO NOT SLEEP WITH PETS.    Day of Surgery: Shower as above. Do not apply any deodorants/lotions. Please wear clean clothes to the hospital.     Please read over the fact sheets that you were given.

## 2017-10-17 NOTE — Progress Notes (Signed)
   10/17/17 1111  OBSTRUCTIVE SLEEP APNEA  Have you ever been diagnosed with sleep apnea through a sleep study? No  Do you snore loudly (loud enough to be heard through closed doors)?  1  Do you often feel tired, fatigued, or sleepy during the daytime (such as falling asleep during driving or talking to someone)? 1  Has anyone observed you stop breathing during your sleep? 0  Do you have, or are you being treated for high blood pressure? 0  BMI more than 35 kg/m2? 0  Age > 50 (1-yes) 1  Neck circumference greater than:Male 16 inches or larger, Male 17inches or larger? 1  Male Gender (Yes=1) 1  Obstructive Sleep Apnea Score 5  Score 5 or greater  Results sent to PCP

## 2017-10-18 ENCOUNTER — Inpatient Hospital Stay (HOSPITAL_COMMUNITY): Payer: Medicare HMO

## 2017-10-18 ENCOUNTER — Encounter (HOSPITAL_COMMUNITY): Payer: Self-pay | Admitting: *Deleted

## 2017-10-18 ENCOUNTER — Encounter (HOSPITAL_COMMUNITY)
Admission: RE | Disposition: A | Discharge status: Home / Self Care | Payer: Self-pay | Source: Ambulatory Visit | Attending: Thoracic Surgery (Cardiothoracic Vascular Surgery)

## 2017-10-18 ENCOUNTER — Other Ambulatory Visit: Payer: Self-pay

## 2017-10-18 ENCOUNTER — Inpatient Hospital Stay (HOSPITAL_COMMUNITY): Payer: Medicare HMO | Admitting: Anesthesiology

## 2017-10-18 ENCOUNTER — Ambulatory Visit (HOSPITAL_COMMUNITY): Payer: Medicare HMO

## 2017-10-18 ENCOUNTER — Inpatient Hospital Stay (HOSPITAL_COMMUNITY)
Admission: RE | Admit: 2017-10-18 | Discharge: 2017-10-27 | DRG: 219 | Disposition: A | Discharge status: Home / Self Care | Payer: Medicare HMO | Source: Ambulatory Visit | Attending: Thoracic Surgery (Cardiothoracic Vascular Surgery) | Admitting: Thoracic Surgery (Cardiothoracic Vascular Surgery)

## 2017-10-18 DIAGNOSIS — Z79899 Other long term (current) drug therapy: Secondary | ICD-10-CM | POA: Diagnosis not present

## 2017-10-18 DIAGNOSIS — I5043 Acute on chronic combined systolic (congestive) and diastolic (congestive) heart failure: Secondary | ICD-10-CM | POA: Diagnosis not present

## 2017-10-18 DIAGNOSIS — Z8249 Family history of ischemic heart disease and other diseases of the circulatory system: Secondary | ICD-10-CM | POA: Diagnosis not present

## 2017-10-18 DIAGNOSIS — I340 Nonrheumatic mitral (valve) insufficiency: Secondary | ICD-10-CM | POA: Diagnosis present

## 2017-10-18 DIAGNOSIS — I447 Left bundle-branch block, unspecified: Secondary | ICD-10-CM | POA: Diagnosis not present

## 2017-10-18 DIAGNOSIS — F419 Anxiety disorder, unspecified: Secondary | ICD-10-CM | POA: Diagnosis present

## 2017-10-18 DIAGNOSIS — J449 Chronic obstructive pulmonary disease, unspecified: Secondary | ICD-10-CM | POA: Diagnosis present

## 2017-10-18 DIAGNOSIS — K529 Noninfective gastroenteritis and colitis, unspecified: Secondary | ICD-10-CM | POA: Diagnosis present

## 2017-10-18 DIAGNOSIS — R918 Other nonspecific abnormal finding of lung field: Secondary | ICD-10-CM | POA: Diagnosis present

## 2017-10-18 DIAGNOSIS — F17210 Nicotine dependence, cigarettes, uncomplicated: Secondary | ICD-10-CM | POA: Diagnosis present

## 2017-10-18 DIAGNOSIS — R7303 Prediabetes: Secondary | ICD-10-CM | POA: Diagnosis present

## 2017-10-18 DIAGNOSIS — Z953 Presence of xenogenic heart valve: Secondary | ICD-10-CM | POA: Diagnosis not present

## 2017-10-18 DIAGNOSIS — D6959 Other secondary thrombocytopenia: Secondary | ICD-10-CM | POA: Diagnosis not present

## 2017-10-18 DIAGNOSIS — J9811 Atelectasis: Secondary | ICD-10-CM

## 2017-10-18 DIAGNOSIS — I350 Nonrheumatic aortic (valve) stenosis: Secondary | ICD-10-CM | POA: Diagnosis present

## 2017-10-18 DIAGNOSIS — Z9049 Acquired absence of other specified parts of digestive tract: Secondary | ICD-10-CM

## 2017-10-18 DIAGNOSIS — K219 Gastro-esophageal reflux disease without esophagitis: Secondary | ICD-10-CM | POA: Diagnosis present

## 2017-10-18 DIAGNOSIS — J90 Pleural effusion, not elsewhere classified: Secondary | ICD-10-CM

## 2017-10-18 DIAGNOSIS — I429 Cardiomyopathy, unspecified: Secondary | ICD-10-CM | POA: Diagnosis present

## 2017-10-18 DIAGNOSIS — Z801 Family history of malignant neoplasm of trachea, bronchus and lung: Secondary | ICD-10-CM

## 2017-10-18 DIAGNOSIS — I2510 Atherosclerotic heart disease of native coronary artery without angina pectoris: Secondary | ICD-10-CM | POA: Diagnosis present

## 2017-10-18 DIAGNOSIS — I5023 Acute on chronic systolic (congestive) heart failure: Secondary | ICD-10-CM | POA: Diagnosis present

## 2017-10-18 DIAGNOSIS — I700 Atherosclerosis of aorta: Secondary | ICD-10-CM | POA: Diagnosis present

## 2017-10-18 DIAGNOSIS — D62 Acute posthemorrhagic anemia: Secondary | ICD-10-CM | POA: Diagnosis not present

## 2017-10-18 DIAGNOSIS — I472 Ventricular tachycardia: Secondary | ICD-10-CM | POA: Diagnosis not present

## 2017-10-18 DIAGNOSIS — R001 Bradycardia, unspecified: Secondary | ICD-10-CM | POA: Diagnosis not present

## 2017-10-18 DIAGNOSIS — I509 Heart failure, unspecified: Secondary | ICD-10-CM

## 2017-10-18 DIAGNOSIS — I5022 Chronic systolic (congestive) heart failure: Secondary | ICD-10-CM | POA: Diagnosis not present

## 2017-10-18 DIAGNOSIS — I358 Other nonrheumatic aortic valve disorders: Secondary | ICD-10-CM | POA: Diagnosis not present

## 2017-10-18 DIAGNOSIS — I440 Atrioventricular block, first degree: Secondary | ICD-10-CM | POA: Diagnosis not present

## 2017-10-18 DIAGNOSIS — I428 Other cardiomyopathies: Secondary | ICD-10-CM

## 2017-10-18 HISTORY — PX: STERNOTOMY: SHX1057

## 2017-10-18 HISTORY — DX: Presence of xenogenic heart valve: Z95.3

## 2017-10-18 HISTORY — PX: TEE WITHOUT CARDIOVERSION: SHX5443

## 2017-10-18 HISTORY — PX: AORTIC VALVE REPLACEMENT: SHX41

## 2017-10-18 LAB — GLUCOSE, CAPILLARY
GLUCOSE-CAPILLARY: 133 mg/dL — AB (ref 65–99)
GLUCOSE-CAPILLARY: 132 mg/dL — AB (ref 65–99)
GLUCOSE-CAPILLARY: 145 mg/dL — AB (ref 65–99)
GLUCOSE-CAPILLARY: 136 mg/dL — AB (ref 65–99)
GLUCOSE-CAPILLARY: 133 mg/dL — AB (ref 65–99)
GLUCOSE-CAPILLARY: 171 mg/dL — AB (ref 65–99)
GLUCOSE-CAPILLARY: 150 mg/dL — AB (ref 65–99)
Glucose-Capillary: 127 mg/dL — ABNORMAL HIGH (ref 65–99)
Glucose-Capillary: 136 mg/dL — ABNORMAL HIGH (ref 65–99)
Glucose-Capillary: 138 mg/dL — ABNORMAL HIGH (ref 65–99)

## 2017-10-18 LAB — POCT I-STAT, CHEM 8
BUN: 16 mg/dL (ref 6–20)
BUN: 15 mg/dL (ref 6–20)
BUN: 16 mg/dL (ref 6–20)
BUN: 16 mg/dL (ref 6–20)
BUN: 17 mg/dL (ref 6–20)
BUN: 15 mg/dL (ref 6–20)
BUN: 12 mg/dL (ref 6–20)
CALCIUM ION: 1.05 mmol/L — AB (ref 1.15–1.40)
CALCIUM ION: 1.1 mmol/L — AB (ref 1.15–1.40)
CHLORIDE: 104 mmol/L (ref 101–111)
CHLORIDE: 107 mmol/L (ref 101–111)
CHLORIDE: 104 mmol/L (ref 101–111)
CHLORIDE: 109 mmol/L (ref 101–111)
CREATININE: 0.9 mg/dL (ref 0.61–1.24)
CREATININE: 0.8 mg/dL (ref 0.61–1.24)
CREATININE: 0.9 mg/dL (ref 0.61–1.24)
CREATININE: 0.9 mg/dL (ref 0.61–1.24)
CREATININE: 0.8 mg/dL (ref 0.61–1.24)
Calcium, Ion: 1.22 mmol/L (ref 1.15–1.40)
Calcium, Ion: 1.21 mmol/L (ref 1.15–1.40)
Calcium, Ion: 1.08 mmol/L — ABNORMAL LOW (ref 1.15–1.40)
Calcium, Ion: 1.29 mmol/L (ref 1.15–1.40)
Calcium, Ion: 1.18 mmol/L (ref 1.15–1.40)
Chloride: 106 mmol/L (ref 101–111)
Chloride: 105 mmol/L (ref 101–111)
Chloride: 105 mmol/L (ref 101–111)
Creatinine, Ser: 0.9 mg/dL (ref 0.61–1.24)
Creatinine, Ser: 0.9 mg/dL (ref 0.61–1.24)
GLUCOSE: 118 mg/dL — AB (ref 65–99)
GLUCOSE: 130 mg/dL — AB (ref 65–99)
GLUCOSE: 124 mg/dL — AB (ref 65–99)
GLUCOSE: 157 mg/dL — AB (ref 65–99)
GLUCOSE: 131 mg/dL — AB (ref 65–99)
Glucose, Bld: 124 mg/dL — ABNORMAL HIGH (ref 65–99)
Glucose, Bld: 140 mg/dL — ABNORMAL HIGH (ref 65–99)
HCT: 35 % — ABNORMAL LOW (ref 39.0–52.0)
HCT: 33 % — ABNORMAL LOW (ref 39.0–52.0)
HCT: 33 % — ABNORMAL LOW (ref 39.0–52.0)
HCT: 35 % — ABNORMAL LOW (ref 39.0–52.0)
HEMATOCRIT: 46 % (ref 39.0–52.0)
HEMATOCRIT: 42 % (ref 39.0–52.0)
HEMATOCRIT: 35 % — AB (ref 39.0–52.0)
HEMOGLOBIN: 15.6 g/dL (ref 13.0–17.0)
HEMOGLOBIN: 14.3 g/dL (ref 13.0–17.0)
HEMOGLOBIN: 11.9 g/dL — AB (ref 13.0–17.0)
HEMOGLOBIN: 11.2 g/dL — AB (ref 13.0–17.0)
Hemoglobin: 11.9 g/dL — ABNORMAL LOW (ref 13.0–17.0)
Hemoglobin: 11.2 g/dL — ABNORMAL LOW (ref 13.0–17.0)
Hemoglobin: 11.9 g/dL — ABNORMAL LOW (ref 13.0–17.0)
POTASSIUM: 4.5 mmol/L (ref 3.5–5.1)
POTASSIUM: 4.9 mmol/L (ref 3.5–5.1)
POTASSIUM: 4.9 mmol/L (ref 3.5–5.1)
POTASSIUM: 4.5 mmol/L (ref 3.5–5.1)
POTASSIUM: 4.2 mmol/L (ref 3.5–5.1)
Potassium: 5.5 mmol/L — ABNORMAL HIGH (ref 3.5–5.1)
Potassium: 5.8 mmol/L — ABNORMAL HIGH (ref 3.5–5.1)
SODIUM: 142 mmol/L (ref 135–145)
SODIUM: 143 mmol/L (ref 135–145)
Sodium: 141 mmol/L (ref 135–145)
Sodium: 142 mmol/L (ref 135–145)
Sodium: 137 mmol/L (ref 135–145)
Sodium: 137 mmol/L (ref 135–145)
Sodium: 141 mmol/L (ref 135–145)
TCO2: 27 mmol/L (ref 22–32)
TCO2: 26 mmol/L (ref 22–32)
TCO2: 27 mmol/L (ref 22–32)
TCO2: 24 mmol/L (ref 22–32)
TCO2: 25 mmol/L (ref 22–32)
TCO2: 26 mmol/L (ref 22–32)
TCO2: 23 mmol/L (ref 22–32)

## 2017-10-18 LAB — POCT I-STAT 3, ART BLOOD GAS (G3+)
ACID-BASE DEFICIT: 1 mmol/L (ref 0.0–2.0)
Acid-base deficit: 6 mmol/L — ABNORMAL HIGH (ref 0.0–2.0)
Acid-base deficit: 3 mmol/L — ABNORMAL HIGH (ref 0.0–2.0)
BICARBONATE: 27.2 mmol/L (ref 20.0–28.0)
BICARBONATE: 26.7 mmol/L (ref 20.0–28.0)
BICARBONATE: 21.4 mmol/L (ref 20.0–28.0)
Bicarbonate: 21.3 mmol/L (ref 20.0–28.0)
O2 SAT: 89 %
O2 Saturation: 100 %
O2 Saturation: 100 %
O2 Saturation: 99 %
PCO2 ART: 48.2 mmHg — AB (ref 32.0–48.0)
PH ART: 7.31 — AB (ref 7.350–7.450)
PH ART: 7.253 — AB (ref 7.350–7.450)
PH ART: 7.383 (ref 7.350–7.450)
PO2 ART: 407 mmHg — AB (ref 83.0–108.0)
PO2 ART: 65 mmHg — AB (ref 83.0–108.0)
PO2 ART: 124 mmHg — AB (ref 83.0–108.0)
Patient temperature: 36.9
TCO2: 29 mmol/L (ref 22–32)
TCO2: 28 mmol/L (ref 22–32)
TCO2: 23 mmol/L (ref 22–32)
TCO2: 22 mmol/L (ref 22–32)
pCO2 arterial: 54.1 mmHg — ABNORMAL HIGH (ref 32.0–48.0)
pCO2 arterial: 57.3 mmHg — ABNORMAL HIGH (ref 32.0–48.0)
pCO2 arterial: 35.7 mmHg (ref 32.0–48.0)
pH, Arterial: 7.276 — ABNORMAL LOW (ref 7.350–7.450)
pO2, Arterial: 449 mmHg — ABNORMAL HIGH (ref 83.0–108.0)

## 2017-10-18 LAB — CBC
HCT: 39.4 % (ref 39.0–52.0)
HEMATOCRIT: 37.3 % — AB (ref 39.0–52.0)
HEMOGLOBIN: 12.4 g/dL — AB (ref 13.0–17.0)
Hemoglobin: 13 g/dL (ref 13.0–17.0)
MCH: 31.4 pg (ref 26.0–34.0)
MCH: 31.5 pg (ref 26.0–34.0)
MCHC: 33 g/dL (ref 30.0–36.0)
MCHC: 33.2 g/dL (ref 30.0–36.0)
MCV: 95.2 fL (ref 78.0–100.0)
MCV: 94.7 fL (ref 78.0–100.0)
Platelets: 87 10*3/uL — ABNORMAL LOW (ref 150–400)
Platelets: 87 10*3/uL — ABNORMAL LOW (ref 150–400)
RBC: 4.14 MIL/uL — ABNORMAL LOW (ref 4.22–5.81)
RBC: 3.94 MIL/uL — AB (ref 4.22–5.81)
RDW: 14.6 % (ref 11.5–15.5)
RDW: 14.4 % (ref 11.5–15.5)
WBC: 16.2 10*3/uL — ABNORMAL HIGH (ref 4.0–10.5)
WBC: 12.9 10*3/uL — ABNORMAL HIGH (ref 4.0–10.5)

## 2017-10-18 LAB — POCT I-STAT 4, (NA,K, GLUC, HGB,HCT)
GLUCOSE: 129 mg/dL — AB (ref 65–99)
HEMATOCRIT: 37 % — AB (ref 39.0–52.0)
Hemoglobin: 12.6 g/dL — ABNORMAL LOW (ref 13.0–17.0)
Potassium: 4.7 mmol/L (ref 3.5–5.1)
Sodium: 144 mmol/L (ref 135–145)

## 2017-10-18 LAB — PROTIME-INR
INR: 1.04
INR: 1.46
Prothrombin Time: 13.5 seconds (ref 11.4–15.2)
Prothrombin Time: 17.6 seconds — ABNORMAL HIGH (ref 11.4–15.2)

## 2017-10-18 LAB — APTT
APTT: 37 s — AB (ref 24–36)
APTT: 43 s — AB (ref 24–36)

## 2017-10-18 LAB — HEMOGLOBIN AND HEMATOCRIT, BLOOD
HEMATOCRIT: 37.1 % — AB (ref 39.0–52.0)
Hemoglobin: 12.1 g/dL — ABNORMAL LOW (ref 13.0–17.0)

## 2017-10-18 LAB — CREATININE, SERUM
Creatinine, Ser: 0.97 mg/dL (ref 0.61–1.24)
GFR calc non Af Amer: >60 mL/min (ref >60)

## 2017-10-18 LAB — PLATELET COUNT: PLATELETS: 90 10*3/uL — AB (ref 150–400)

## 2017-10-18 LAB — MAGNESIUM: Magnesium: 3.1 mg/dL — ABNORMAL HIGH (ref 1.7–2.4)

## 2017-10-18 SURGERY — REPLACEMENT, AORTIC VALVE, OPEN
Anesthesia: General | Site: Chest

## 2017-10-18 MED ORDER — ROCURONIUM BROMIDE 10 MG/ML (PF) SYRINGE
PREFILLED_SYRINGE | INTRAVENOUS | Status: AC
  Filled 2017-10-18: qty 10

## 2017-10-18 MED ORDER — NOREPINEPHRINE BITARTRATE 1 MG/ML IV SOLN
0.0000 ug/min | INTRAVENOUS | Status: DC
  Administered 2017-10-18: 10 ug/min via INTRAVENOUS
  Filled 2017-10-18: qty 16

## 2017-10-18 MED ORDER — ONDANSETRON HCL 4 MG/2ML IJ SOLN: 4.0000 mg | Freq: Four times a day (QID) | INTRAMUSCULAR | Status: DC | PRN

## 2017-10-18 MED ORDER — ALBUMIN HUMAN 5 % IV SOLN
250.0000 mL | INTRAVENOUS | Status: AC | PRN
  Administered 2017-10-18 (×3): 250 mL via INTRAVENOUS
  Filled 2017-10-18: qty 250

## 2017-10-18 MED ORDER — PHENYLEPHRINE 40 MCG/ML (10ML) SYRINGE FOR IV PUSH (FOR BLOOD PRESSURE SUPPORT)
PREFILLED_SYRINGE | INTRAVENOUS | Status: AC
  Filled 2017-10-18: qty 10

## 2017-10-18 MED ORDER — LACTATED RINGERS IV SOLN
INTRAVENOUS | Status: DC | PRN
  Administered 2017-10-18: 08:00:00 via INTRAVENOUS

## 2017-10-18 MED ORDER — SODIUM BICARBONATE 8.4 % IV SOLN
50.0000 meq | INTRAVENOUS | Status: AC
  Administered 2017-10-18: 50 meq via INTRAVENOUS

## 2017-10-18 MED ORDER — ROCURONIUM BROMIDE 10 MG/ML (PF) SYRINGE
PREFILLED_SYRINGE | INTRAVENOUS | Status: DC | PRN
  Administered 2017-10-18: 50 mg via INTRAVENOUS
  Administered 2017-10-18: 30 mg via INTRAVENOUS
  Administered 2017-10-18 (×2): 50 mg via INTRAVENOUS

## 2017-10-18 MED ORDER — ASPIRIN 81 MG PO CHEW: 324.0000 mg | CHEWABLE_TABLET | Freq: Every day | ORAL | Status: DC

## 2017-10-18 MED ORDER — MORPHINE SULFATE (PF) 2 MG/ML IV SOLN: 1.0000 mg | INTRAVENOUS | Status: DC | PRN

## 2017-10-18 MED ORDER — CHLORHEXIDINE GLUCONATE 0.12 % MT SOLN
15.0000 mL | OROMUCOSAL | Status: AC
  Administered 2017-10-18: 15 mL via OROMUCOSAL

## 2017-10-18 MED ORDER — LACTATED RINGERS IV SOLN
INTRAVENOUS | Status: DC
  Administered 2017-10-20 – 2017-10-24 (×2): via INTRAVENOUS

## 2017-10-18 MED ORDER — VANCOMYCIN HCL IN DEXTROSE 1-5 GM/200ML-% IV SOLN
1000.0000 mg | Freq: Once | INTRAVENOUS | Status: AC
  Administered 2017-10-18: 1000 mg via INTRAVENOUS
  Filled 2017-10-18: qty 200

## 2017-10-18 MED ORDER — ETOMIDATE 2 MG/ML IV SOLN
INTRAVENOUS | Status: DC | PRN
  Administered 2017-10-18: 12 mg via INTRAVENOUS

## 2017-10-18 MED ORDER — SODIUM CHLORIDE 0.9 % IJ SOLN
INTRAMUSCULAR | Status: DC | PRN
  Administered 2017-10-18 (×3): via TOPICAL

## 2017-10-18 MED ORDER — EPHEDRINE SULFATE 50 MG/ML IJ SOLN
INTRAMUSCULAR | Status: DC | PRN
  Administered 2017-10-18: 10 mg via INTRAVENOUS

## 2017-10-18 MED ORDER — ACETAMINOPHEN 500 MG PO TABS
1000.0000 mg | ORAL_TABLET | Freq: Four times a day (QID) | ORAL | Status: AC
  Administered 2017-10-18 – 2017-10-23 (×18): 1000 mg via ORAL
  Filled 2017-10-18 (×19): qty 2

## 2017-10-18 MED ORDER — METOPROLOL TARTRATE 12.5 MG HALF TABLET
12.5000 mg | ORAL_TABLET | Freq: Once | ORAL | Status: AC
  Administered 2017-10-18: 12.5 mg via ORAL
  Filled 2017-10-18: qty 1

## 2017-10-18 MED ORDER — SODIUM CHLORIDE 0.9% FLUSH
3.0000 mL | Freq: Two times a day (BID) | INTRAVENOUS | Status: DC
  Administered 2017-10-20: 3 mL via INTRAVENOUS

## 2017-10-18 MED ORDER — PANTOPRAZOLE SODIUM 40 MG PO TBEC
40.0000 mg | DELAYED_RELEASE_TABLET | Freq: Every day | ORAL | Status: DC
  Administered 2017-10-20 – 2017-10-27 (×8): 40 mg via ORAL
  Filled 2017-10-18 (×8): qty 1

## 2017-10-18 MED ORDER — CHLORHEXIDINE GLUCONATE 4 % EX LIQD: 30.0000 mL | CUTANEOUS | Status: DC

## 2017-10-18 MED ORDER — SODIUM CHLORIDE 0.9% FLUSH
10.0000 mL | Freq: Two times a day (BID) | INTRAVENOUS | Status: DC
  Administered 2017-10-18 – 2017-10-23 (×5): 10 mL

## 2017-10-18 MED ORDER — ASPIRIN EC 325 MG PO TBEC
325.0000 mg | DELAYED_RELEASE_TABLET | Freq: Every day | ORAL | Status: DC
  Administered 2017-10-19 – 2017-10-27 (×9): 325 mg via ORAL
  Filled 2017-10-18 (×9): qty 1

## 2017-10-18 MED ORDER — POTASSIUM CHLORIDE 10 MEQ/50ML IV SOLN: 10.0000 meq | INTRAVENOUS | Status: AC

## 2017-10-18 MED ORDER — SODIUM CHLORIDE 0.9 % IV SOLN
INTRAVENOUS | Status: DC | PRN
  Administered 2017-10-18: 80 ug via INTRAVENOUS
  Administered 2017-10-18: 120 ug via INTRAVENOUS

## 2017-10-18 MED ORDER — CEFUROXIME SODIUM 750 MG IJ SOLR
750.0000 mg | Freq: Three times a day (TID) | INTRAMUSCULAR | Status: DC
  Administered 2017-10-18: .75 g via INTRAMUSCULAR
  Filled 2017-10-18: qty 750

## 2017-10-18 MED ORDER — SODIUM CHLORIDE 0.9 % IV SOLN
0.0000 ug/kg/h | INTRAVENOUS | Status: DC
  Administered 2017-10-18: 0.7 ug/kg/h via INTRAVENOUS
  Filled 2017-10-18 (×2): qty 2

## 2017-10-18 MED ORDER — CALCIUM CHLORIDE 10 % IV SOLN
INTRAVENOUS | Status: AC
  Filled 2017-10-18: qty 20

## 2017-10-18 MED ORDER — DEXTROSE 5 % IV SOLN
0.0000 ug/min | INTRAVENOUS | Status: DC
  Administered 2017-10-18: 10 ug/min via INTRAVENOUS
  Filled 2017-10-18: qty 4

## 2017-10-18 MED ORDER — SODIUM CHLORIDE 0.9% FLUSH
10.0000 mL | INTRAVENOUS | Status: DC | PRN
  Administered 2017-10-24: 10 mL
  Filled 2017-10-18: qty 40

## 2017-10-18 MED ORDER — MAGNESIUM SULFATE 4 GM/100ML IV SOLN
4.0000 g | Freq: Once | INTRAVENOUS | Status: AC
  Administered 2017-10-18: 4 g via INTRAVENOUS
  Filled 2017-10-18: qty 100

## 2017-10-18 MED ORDER — EPINEPHRINE PF 1 MG/10ML IJ SOSY
PREFILLED_SYRINGE | INTRAMUSCULAR | Status: AC
  Filled 2017-10-18: qty 10

## 2017-10-18 MED ORDER — SODIUM CHLORIDE 0.9 % IJ SOLN
INTRAMUSCULAR | Status: AC
  Filled 2017-10-18: qty 10

## 2017-10-18 MED ORDER — SODIUM CHLORIDE 0.9 % IV SOLN
INTRAVENOUS | Status: DC | PRN
  Administered 2017-10-18: 13:00:00 via INTRAVENOUS

## 2017-10-18 MED ORDER — PROTAMINE SULFATE 10 MG/ML IV SOLN
INTRAVENOUS | Status: AC
  Filled 2017-10-18: qty 5

## 2017-10-18 MED ORDER — PROPOFOL 10 MG/ML IV BOLUS
INTRAVENOUS | Status: AC
  Filled 2017-10-18: qty 20

## 2017-10-18 MED ORDER — INSULIN REGULAR BOLUS VIA INFUSION
0.0000 [IU] | Freq: Three times a day (TID) | INTRAVENOUS | Status: DC
  Filled 2017-10-18: qty 10

## 2017-10-18 MED ORDER — CHLORHEXIDINE GLUCONATE CLOTH 2 % EX PADS
6.0000 | MEDICATED_PAD | Freq: Every day | CUTANEOUS | Status: DC
Start: 2017-10-18 — End: 2017-10-21
  Administered 2017-10-18 – 2017-10-21 (×4): 6 via TOPICAL

## 2017-10-18 MED ORDER — SODIUM CHLORIDE 0.9 % IV SOLN
250.0000 mL | INTRAVENOUS | Status: DC
  Administered 2017-10-25: 23:00:00 via INTRAVENOUS

## 2017-10-18 MED ORDER — LACTATED RINGERS IV SOLN
500.0000 mL | Freq: Once | INTRAVENOUS | Status: AC | PRN
  Administered 2017-10-18: 500 mL via INTRAVENOUS

## 2017-10-18 MED ORDER — NITROGLYCERIN IN D5W 200-5 MCG/ML-% IV SOLN: 0.0000 ug/min | INTRAVENOUS | Status: DC

## 2017-10-18 MED ORDER — BISACODYL 10 MG RE SUPP: 10.0000 mg | Freq: Every day | RECTAL | Status: DC

## 2017-10-18 MED ORDER — MILRINONE LACTATE IN DEXTROSE 20-5 MG/100ML-% IV SOLN
0.1000 ug/kg/min | INTRAVENOUS | Status: DC
  Administered 2017-10-18 – 2017-10-19 (×2): 0.5 ug/kg/min via INTRAVENOUS
  Administered 2017-10-19 – 2017-10-20 (×2): 0.375 ug/kg/min via INTRAVENOUS
  Administered 2017-10-20 – 2017-10-21 (×2): 0.3 ug/kg/min via INTRAVENOUS
  Administered 2017-10-22: 0.2 ug/kg/min via INTRAVENOUS
  Administered 2017-10-25: 0.1 ug/kg/min via INTRAVENOUS
  Filled 2017-10-18 (×9): qty 100

## 2017-10-18 MED ORDER — SODIUM CHLORIDE 0.9 % IV SOLN
INTRAVENOUS | Status: DC
  Administered 2017-10-18: 14:00:00 via INTRAVENOUS

## 2017-10-18 MED ORDER — ETOMIDATE 2 MG/ML IV SOLN
INTRAVENOUS | Status: AC
  Filled 2017-10-18: qty 10

## 2017-10-18 MED ORDER — MORPHINE SULFATE (PF) 4 MG/ML IV SOLN
1.0000 mg | INTRAVENOUS | Status: DC | PRN
  Administered 2017-10-19 – 2017-10-20 (×4): 2 mg via INTRAVENOUS
  Filled 2017-10-18 (×4): qty 1

## 2017-10-18 MED ORDER — DOCUSATE SODIUM 100 MG PO CAPS
200.0000 mg | ORAL_CAPSULE | Freq: Every day | ORAL | Status: DC
  Administered 2017-10-19 – 2017-10-22 (×4): 200 mg via ORAL
  Filled 2017-10-18 (×7): qty 2

## 2017-10-18 MED ORDER — ALBUMIN HUMAN 5 % IV SOLN
INTRAVENOUS | Status: DC | PRN
  Administered 2017-10-18 (×2): via INTRAVENOUS

## 2017-10-18 MED ORDER — MIDAZOLAM HCL 2 MG/2ML IJ SOLN: 2.0000 mg | INTRAMUSCULAR | Status: DC | PRN

## 2017-10-18 MED ORDER — DEXTROSE 5 % IV SOLN
0.0000 ug/min | INTRAVENOUS | Status: DC
  Administered 2017-10-18: 3 ug/min via INTRAVENOUS
  Filled 2017-10-18: qty 4

## 2017-10-18 MED ORDER — SODIUM CHLORIDE 0.9 % IV SOLN
0.0000 ug/min | INTRAVENOUS | Status: DC
  Administered 2017-10-18: 50 ug/min via INTRAVENOUS
  Filled 2017-10-18 (×2): qty 2

## 2017-10-18 MED ORDER — MIDAZOLAM HCL 5 MG/5ML IJ SOLN
INTRAMUSCULAR | Status: DC | PRN
  Administered 2017-10-18: 1 mg via INTRAVENOUS
  Administered 2017-10-18: 3 mg via INTRAVENOUS
  Administered 2017-10-18: 1 mg via INTRAVENOUS
  Administered 2017-10-18: 2 mg via INTRAVENOUS
  Administered 2017-10-18: 1 mg via INTRAVENOUS

## 2017-10-18 MED ORDER — HEPARIN SODIUM (PORCINE) 1000 UNIT/ML IJ SOLN
INTRAMUSCULAR | Status: DC | PRN
  Administered 2017-10-18: 27000 [IU] via INTRAVENOUS

## 2017-10-18 MED ORDER — SODIUM CHLORIDE 0.9% FLUSH: 3.0000 mL | INTRAVENOUS | Status: DC | PRN

## 2017-10-18 MED ORDER — ACETAMINOPHEN 160 MG/5ML PO SOLN: 650.0000 mg | Freq: Once | ORAL | Status: AC

## 2017-10-18 MED ORDER — CHLORHEXIDINE GLUCONATE 0.12% ORAL RINSE (MEDLINE KIT)
15.0000 mL | Freq: Two times a day (BID) | OROMUCOSAL | Status: DC
  Administered 2017-10-18: 15 mL via OROMUCOSAL

## 2017-10-18 MED ORDER — FAMOTIDINE IN NACL 20-0.9 MG/50ML-% IV SOLN
20.0000 mg | Freq: Two times a day (BID) | INTRAVENOUS | Status: AC
  Administered 2017-10-18 – 2017-10-19 (×2): 20 mg via INTRAVENOUS
  Filled 2017-10-18: qty 50

## 2017-10-18 MED ORDER — ACETAMINOPHEN 650 MG RE SUPP
650.0000 mg | Freq: Once | RECTAL | Status: AC
  Administered 2017-10-18: 650 mg via RECTAL

## 2017-10-18 MED ORDER — CHLORHEXIDINE GLUCONATE 0.12 % MT SOLN
15.0000 mL | Freq: Once | OROMUCOSAL | Status: AC
  Administered 2017-10-18: 15 mL via OROMUCOSAL
  Filled 2017-10-18: qty 15

## 2017-10-18 MED ORDER — ORAL CARE MOUTH RINSE
15.0000 mL | Freq: Four times a day (QID) | OROMUCOSAL | Status: DC
  Administered 2017-10-19: 15 mL via OROMUCOSAL

## 2017-10-18 MED ORDER — HEPARIN SODIUM (PORCINE) 1000 UNIT/ML IJ SOLN
INTRAMUSCULAR | Status: AC
  Filled 2017-10-18: qty 1

## 2017-10-18 MED ORDER — PROTAMINE SULFATE 10 MG/ML IV SOLN
INTRAVENOUS | Status: DC | PRN
  Administered 2017-10-18: 270 mg via INTRAVENOUS
  Administered 2017-10-18: 10 mg via INTRAVENOUS

## 2017-10-18 MED ORDER — PROTAMINE SULFATE 10 MG/ML IV SOLN
INTRAVENOUS | Status: AC
  Filled 2017-10-18: qty 25

## 2017-10-18 MED ORDER — MILRINONE LACTATE IN DEXTROSE 20-5 MG/100ML-% IV SOLN
0.2500 ug/kg/min | Freq: Once | INTRAVENOUS | Status: AC
  Administered 2017-10-18: .3 ug/kg/min via INTRAVENOUS
  Filled 2017-10-18: qty 100

## 2017-10-18 MED ORDER — ACETAMINOPHEN 160 MG/5ML PO SOLN: 1000.0000 mg | Freq: Four times a day (QID) | ORAL | Status: DC

## 2017-10-18 MED ORDER — BISACODYL 5 MG PO TBEC
10.0000 mg | DELAYED_RELEASE_TABLET | Freq: Every day | ORAL | Status: DC
  Administered 2017-10-19 – 2017-10-22 (×4): 10 mg via ORAL
  Filled 2017-10-18 (×6): qty 2

## 2017-10-18 MED ORDER — OXYCODONE HCL 5 MG PO TABS
5.0000 mg | ORAL_TABLET | ORAL | Status: DC | PRN
  Administered 2017-10-19 – 2017-10-20 (×3): 5 mg via ORAL
  Filled 2017-10-18: qty 2
  Filled 2017-10-18 (×4): qty 1

## 2017-10-18 MED ORDER — DEXTROSE 5 % IV SOLN
1.5000 g | Freq: Two times a day (BID) | INTRAVENOUS | Status: AC
  Administered 2017-10-18 – 2017-10-20 (×4): 1.5 g via INTRAVENOUS
  Filled 2017-10-18 (×4): qty 1.5

## 2017-10-18 MED ORDER — LACTATED RINGERS IV SOLN: INTRAVENOUS | Status: DC

## 2017-10-18 MED ORDER — SODIUM CHLORIDE 0.45 % IV SOLN
INTRAVENOUS | Status: DC | PRN
  Administered 2017-10-18: 14:00:00 via INTRAVENOUS

## 2017-10-18 MED ORDER — TRAMADOL HCL 50 MG PO TABS
50.0000 mg | ORAL_TABLET | ORAL | Status: DC | PRN
  Administered 2017-10-19: 100 mg via ORAL
  Administered 2017-10-21: 50 mg via ORAL
  Administered 2017-10-22: 100 mg via ORAL
  Filled 2017-10-18: qty 2
  Filled 2017-10-18: qty 1
  Filled 2017-10-18: qty 2

## 2017-10-18 MED ORDER — INSULIN REGULAR HUMAN 100 UNIT/ML IJ SOLN
INTRAMUSCULAR | Status: DC
  Filled 2017-10-18 (×2): qty 1

## 2017-10-18 MED ORDER — EPHEDRINE 5 MG/ML INJ
INTRAVENOUS | Status: AC
  Filled 2017-10-18: qty 10

## 2017-10-18 MED ORDER — FENTANYL CITRATE (PF) 250 MCG/5ML IJ SOLN
INTRAMUSCULAR | Status: DC | PRN
  Administered 2017-10-18: 150 ug via INTRAVENOUS
  Administered 2017-10-18: 50 ug via INTRAVENOUS
  Administered 2017-10-18: 150 ug via INTRAVENOUS
  Administered 2017-10-18: 250 ug via INTRAVENOUS
  Administered 2017-10-18: 150 ug via INTRAVENOUS
  Administered 2017-10-18 (×2): 100 ug via INTRAVENOUS
  Administered 2017-10-18: 50 ug via INTRAVENOUS

## 2017-10-18 MED ORDER — CALCIUM GLUCONATE 10 % IV SOLN
INTRAVENOUS | Status: DC | PRN
  Administered 2017-10-18: 500 mg via INTRAVENOUS
  Administered 2017-10-18 (×3): 200 mg via INTRAVENOUS
  Administered 2017-10-18: 500 mg via INTRAVENOUS

## 2017-10-18 MED ORDER — MIDAZOLAM HCL 10 MG/2ML IJ SOLN
INTRAMUSCULAR | Status: AC
  Filled 2017-10-18: qty 2

## 2017-10-18 MED ORDER — FENTANYL CITRATE (PF) 250 MCG/5ML IJ SOLN
INTRAMUSCULAR | Status: AC
  Filled 2017-10-18: qty 25

## 2017-10-18 MED ORDER — METOPROLOL TARTRATE 5 MG/5ML IV SOLN
2.5000 mg | INTRAVENOUS | Status: DC | PRN
  Administered 2017-10-20: 5 mg via INTRAVENOUS
  Filled 2017-10-18: qty 5

## 2017-10-18 SURGICAL SUPPLY — 101 items
ADAPTER CARDIO PERF ANTE/RETRO (ADAPTER) ×4 IMPLANT
BAG DECANTER FOR FLEXI CONT (MISCELLANEOUS) ×4 IMPLANT
BLADE STERNUM SYSTEM 6 (BLADE) ×4 IMPLANT
CABLE PACING FASLOC BIEGE (MISCELLANEOUS) ×4 IMPLANT
CANISTER SUCT 3000ML PPV (MISCELLANEOUS) ×4 IMPLANT
CANNULA AORTIC ROOT 9FR (CANNULA) ×4 IMPLANT
CANNULA EZ GLIDE AORTIC 21FR (CANNULA) ×4 IMPLANT
CANNULA GUNDRY RCSP 15FR (MISCELLANEOUS) ×4 IMPLANT
CANNULA MC2 2 STG 36/46 NON-V (CANNULA) ×3 IMPLANT
CANNULA VENOUS 2 STG 34/46 (CANNULA) ×1
CELLS DAT CNTRL 66122 CELL SVR (MISCELLANEOUS) ×3 IMPLANT
CONN ST 1/4X3/8  BEN (MISCELLANEOUS) ×2
CONN ST 1/4X3/8 BEN (MISCELLANEOUS) ×6 IMPLANT
CONT SPEC 4OZ CLIKSEAL STRL BL (MISCELLANEOUS) ×4 IMPLANT
COVER BACK TABLE 24X17X13 BIG (DRAPES) ×4 IMPLANT
CRADLE DONUT ADULT HEAD (MISCELLANEOUS) ×4 IMPLANT
DERMABOND ADVANCED (GAUZE/BANDAGES/DRESSINGS) ×2
DERMABOND ADVANCED .7 DNX12 (GAUZE/BANDAGES/DRESSINGS) ×6 IMPLANT
DEVICE PMI PUNCTURE CLOSURE (MISCELLANEOUS) ×4 IMPLANT
DEVICE SUT CK QUICK LOAD INDV (Prosthesis & Implant Heart) ×8 IMPLANT
DEVICE SUT CK QUICK LOAD MINI (Prosthesis & Implant Heart) ×4 IMPLANT
DEVICE TROCAR PUNCTURE CLOSURE (ENDOMECHANICALS) ×4 IMPLANT
DRAIN CHANNEL 32F RND 10.7 FF (WOUND CARE) ×8 IMPLANT
DRAPE BILATERAL SPLIT (DRAPES) ×4 IMPLANT
DRAPE CV SPLIT W-CLR ANES SCRN (DRAPES) ×4 IMPLANT
DRAPE INCISE IOBAN 66X45 STRL (DRAPES) ×8 IMPLANT
DRAPE SLUSH/WARMER DISC (DRAPES) ×4 IMPLANT
DRSG AQUACEL AG ADV 3.5X14 (GAUZE/BANDAGES/DRESSINGS) ×4 IMPLANT
ELECT BLADE 4.0 EZ CLEAN MEGAD (MISCELLANEOUS) ×4
ELECT BLADE 6.5 EXT (BLADE) ×4 IMPLANT
ELECT REM PT RETURN 9FT ADLT (ELECTROSURGICAL) ×8
ELECTRODE BLDE 4.0 EZ CLN MEGD (MISCELLANEOUS) ×3 IMPLANT
ELECTRODE REM PT RTRN 9FT ADLT (ELECTROSURGICAL) ×6 IMPLANT
FELT TEFLON 1X6 (MISCELLANEOUS) ×8 IMPLANT
FLUID NSS /IRRIG 3000 ML XXX (IV SOLUTION) ×4 IMPLANT
GAUZE SPONGE 4X4 12PLY STRL (GAUZE/BANDAGES/DRESSINGS) ×4 IMPLANT
GAUZE SPONGE 4X4 12PLY STRL LF (GAUZE/BANDAGES/DRESSINGS) ×4 IMPLANT
GLOVE ORTHO TXT STRL SZ7.5 (GLOVE) ×12 IMPLANT
GOWN STRL REUS W/ TWL LRG LVL3 (GOWN DISPOSABLE) ×24 IMPLANT
GOWN STRL REUS W/TWL LRG LVL3 (GOWN DISPOSABLE) ×8
KIT BASIN OR (CUSTOM PROCEDURE TRAY) ×4 IMPLANT
KIT DILATOR VASC 18G NDL (KITS) ×4 IMPLANT
KIT DRAINAGE VACCUM ASSIST (KITS) ×4 IMPLANT
KIT ROOM TURNOVER OR (KITS) ×4 IMPLANT
KIT SUCTION CATH 14FR (SUCTIONS) ×4 IMPLANT
KIT SUT CK MINI COMBO 4X17 (Prosthesis & Implant Heart) ×4 IMPLANT
LEAD PACING MYOCARDI (MISCELLANEOUS) ×4 IMPLANT
LINE VENT (MISCELLANEOUS) ×4 IMPLANT
NS IRRIG 1000ML POUR BTL (IV SOLUTION) ×28 IMPLANT
PACK OPEN HEART (CUSTOM PROCEDURE TRAY) ×4 IMPLANT
PAD ARMBOARD 7.5X6 YLW CONV (MISCELLANEOUS) ×8 IMPLANT
PAD ELECT DEFIB RADIOL ZOLL (MISCELLANEOUS) ×4 IMPLANT
RTRCTR WOUND ALEXIS 18CM MED (MISCELLANEOUS) ×4
SET CANNULATION TOURNIQUET (MISCELLANEOUS) ×8 IMPLANT
SET CARDIOPLEGIA MPS 5001102 (MISCELLANEOUS) ×4 IMPLANT
SET IRRIG TUBING LAPAROSCOPIC (IRRIGATION / IRRIGATOR) ×4 IMPLANT
SOLUTION ANTI FOG 6CC (MISCELLANEOUS) ×4 IMPLANT
SUT BONE WAX W31G (SUTURE) ×8 IMPLANT
SUT ETHIBOND 2 0 SH (SUTURE) ×1
SUT ETHIBOND 2 0 SH 36X2 (SUTURE) ×3 IMPLANT
SUT ETHIBOND NAB MH 2-0 36IN (SUTURE) ×4 IMPLANT
SUT ETHIBOND X763 2 0 SH 1 (SUTURE) ×12 IMPLANT
SUT GORETEX CV 4 TH 22 36 (SUTURE) ×4 IMPLANT
SUT GORETEX CV4 TH-18 (SUTURE) ×8 IMPLANT
SUT MNCRL AB 3-0 PS2 18 (SUTURE) ×8 IMPLANT
SUT PROLENE 3 0 SH 1 (SUTURE) ×16 IMPLANT
SUT PROLENE 3 0 SH DA (SUTURE) ×16 IMPLANT
SUT PROLENE 3 0 SH1 36 (SUTURE) ×24 IMPLANT
SUT PROLENE 4 0 RB 1 (SUTURE) ×10
SUT PROLENE 4 0 SH DA (SUTURE) ×4 IMPLANT
SUT PROLENE 4-0 RB1 .5 CRCL 36 (SUTURE) ×30 IMPLANT
SUT PROLENE 5 0 C 1 36 (SUTURE) ×8 IMPLANT
SUT PROLENE 6 0 C 1 30 (SUTURE) ×16 IMPLANT
SUT SILK  1 MH (SUTURE) ×8
SUT SILK 1 MH (SUTURE) ×24 IMPLANT
SUT SILK 1 TIES 10X30 (SUTURE) ×4 IMPLANT
SUT SILK 2 0 SH CR/8 (SUTURE) ×8 IMPLANT
SUT SILK 2 0 TIES 10X30 (SUTURE) ×4 IMPLANT
SUT SILK 2 0SH CR/8 30 (SUTURE) ×8 IMPLANT
SUT SILK 3 0 (SUTURE) ×1
SUT SILK 3 0 SH CR/8 (SUTURE) ×4 IMPLANT
SUT SILK 3 0SH CR/8 30 (SUTURE) ×8 IMPLANT
SUT SILK 3-0 18XBRD TIE 12 (SUTURE) ×3 IMPLANT
SUT STEEL STERNAL CCS#1 18IN (SUTURE) ×4 IMPLANT
SUT STEEL SZ 6 DBL 3X14 BALL (SUTURE) ×8 IMPLANT
SUT TEM PAC WIRE 2 0 SH (SUTURE) ×16 IMPLANT
SUT VIC AB 2-0 CTX 36 (SUTURE) ×8 IMPLANT
SUT VIC AB 2-0 UR6 27 (SUTURE) ×8 IMPLANT
SUT VIC AB 3-0 SH 8-18 (SUTURE) ×12 IMPLANT
SUT VICRYL 2 TP 1 (SUTURE) ×4 IMPLANT
SYSTEM SAHARA CHEST DRAIN ATS (WOUND CARE) ×4 IMPLANT
TAPE CLOTH SURG 4X10 WHT LF (GAUZE/BANDAGES/DRESSINGS) ×4 IMPLANT
TAPE PAPER 2X10 WHT MICROPORE (GAUZE/BANDAGES/DRESSINGS) ×4 IMPLANT
TOWEL OR 17X26 10 PK STRL BLUE (TOWEL DISPOSABLE) ×4 IMPLANT
TRAY FOLEY SILVER 16FR TEMP (SET/KITS/TRAYS/PACK) ×4 IMPLANT
TROCAR XCEL BLADELESS 5X75MML (TROCAR) IMPLANT
TROCAR XCEL NON-BLD 11X100MML (ENDOMECHANICALS) ×8 IMPLANT
TUBE SUCT INTRACARD DLP 20F (MISCELLANEOUS) ×4 IMPLANT
UNDERPAD 30X30 (UNDERPADS AND DIAPERS) ×4 IMPLANT
VALVE MAGNA EASE AORTIC 25MM (Prosthesis & Implant Heart) ×4 IMPLANT
WATER STERILE IRR 1000ML POUR (IV SOLUTION) ×8 IMPLANT

## 2017-10-18 NOTE — Anesthesia Preprocedure Evaluation (Signed)
Anesthesia Evaluation  Patient identified by MRN, date of birth, ID band Patient awake    Reviewed: Allergy & Precautions, NPO status , Patient's Chart, lab work & pertinent test results  Airway Mallampati: II  TM Distance: >3 FB     Dental  (+) Teeth Intact, Dental Advisory Given   Pulmonary Current Smoker,    breath sounds clear to auscultation       Cardiovascular  Rhythm:Regular + Systolic murmurs    Neuro/Psych    GI/Hepatic   Endo/Other    Renal/GU      Musculoskeletal   Abdominal   Peds  Hematology   Anesthesia Other Findings   Reproductive/Obstetrics                             Anesthesia Physical Anesthesia Plan  ASA: III  Anesthesia Plan: General   Post-op Pain Management:    Induction: Intravenous  PONV Risk Score and Plan: Dexamethasone and Ondansetron  Airway Management Planned: Double Lumen EBT  Additional Equipment: Arterial line, CVP, PA Cath and 3D TEE  Intra-op Plan:   Post-operative Plan: Extubation in OR  Informed Consent: I have reviewed the patients History and Physical, chart, labs and discussed the procedure including the risks, benefits and alternatives for the proposed anesthesia with the patient or authorized representative who has indicated his/her understanding and acceptance.   Dental advisory given  Plan Discussed with: CRNA and Anesthesiologist  Anesthesia Plan Comments:         Anesthesia Quick Evaluation

## 2017-10-18 NOTE — Brief Op Note (Signed)
10/18/2017  1:29 PM  PATIENT:  Kenneth Mills  66 y.o. male  PRE-OPERATIVE DIAGNOSIS:  Aortic Stenosis  POST-OPERATIVE DIAGNOSIS:  Aortic Stenosis  PROCEDURE:  Procedure(s): AORTIC VALVE REPLACEMENT (N/A) TRANSESOPHAGEAL ECHOCARDIOGRAM (TEE) (N/A) STERNOTOMY  SURGEON:  Surgeon(s) and Role:    Rexene Alberts, MD - Primary  PHYSICIAN ASSISTANT:  Nicholes Rough, PA-C   ANESTHESIA:   general  EBL:  800 mL   BLOOD ADMINISTERED:none  DRAINS: ROUTINE   LOCAL MEDICATIONS USED:  NONE  SPECIMEN:  Source of Specimen:  aortic valve leaflets  DISPOSITION OF SPECIMEN:  PATHOLOGY  COUNTS:  YES  TOURNIQUET:  * No tourniquets in log *  DICTATION: .Dragon Dictation  PLAN OF CARE: Admit to inpatient   PATIENT DISPOSITION:  ICU - intubated and hemodynamically stable.   Delay start of Pharmacological VTE agent (>24hrs) due to surgical blood loss or risk of bleeding: yes

## 2017-10-18 NOTE — Anesthesia Procedure Notes (Signed)
Procedure Name: Intubation Date/Time: 10/18/2017 9:02 AM Performed by: White, Amedeo Plenty, CRNA Pre-anesthesia Checklist: Patient identified, Emergency Drugs available, Suction available and Patient being monitored Induction Type: Inhalational induction Laryngoscope Size: Mac and 4 Grade View: Grade I Tube size: 8.0 mm Number of attempts: 1 Placement Confirmation: ETT inserted through vocal cords under direct vision and breath sounds checked- equal and bilateral Secured at: 23 cm Tube secured with: Tape Dental Injury: Teeth and Oropharynx as per pre-operative assessment  Comments: DLT exchanged via cook catheter

## 2017-10-18 NOTE — Anesthesia Procedure Notes (Signed)
Central Venous Catheter Insertion Performed by: Roberts Gaudy, MD, anesthesiologist Start/End12/10/2017 7:55 AM, 10/18/2017 8:10 AM Preanesthetic checklist: patient identified, IV checked, site marked, risks and benefits discussed, surgical consent, monitors and equipment checked, pre-op evaluation and timeout performed Position: supine Hand hygiene performed , maximum sterile barriers used  and Seldinger technique used Catheter size: 9 Fr PA cath was placed.Procedure performed using ultrasound guided technique. Ultrasound Notes:anatomy identified, needle tip was noted to be adjacent to the nerve/plexus identified and no ultrasound evidence of intravascular and/or intraneural injection Attempts: 1 Following insertion, line sutured, dressing applied and Biopatch. Post procedure assessment: blood return through all ports, free fluid flow and no air  Patient tolerated the procedure well with no immediate complications.

## 2017-10-18 NOTE — Progress Notes (Signed)
Attempted recruitment post-abg.  Pt tolerated for approximately 10 seconds until BP transiently fell to SBP of 89.  Recruitment stopped, patient placed back on SIMV/PS/PRVC, and BP normalized with SBP in low 100s.  SpO2 98% post-recruitment.

## 2017-10-18 NOTE — Anesthesia Procedure Notes (Addendum)
Procedure Name: Intubation Date/Time: 10/18/2017 8:45 AM Performed by: White, Amedeo Plenty, CRNA Pre-anesthesia Checklist: Patient identified, Emergency Drugs available, Suction available and Patient being monitored Patient Re-evaluated:Patient Re-evaluated prior to induction Oxygen Delivery Method: Circle System Utilized Preoxygenation: Pre-oxygenation with 100% oxygen Induction Type: IV induction Ventilation: Mask ventilation without difficulty and Oral airway inserted - appropriate to patient size Laryngoscope Size: Mac and 4 Grade View: Grade I Tube type: Oral Endobronchial tube: Left, EBT position confirmed by auscultation and EBT position confirmed by fiberoptic bronchoscope and 39 Fr Number of attempts: 1 Airway Equipment and Method: Stylet and Oral airway Placement Confirmation: ETT inserted through vocal cords under direct vision,  positive ETCO2 and breath sounds checked- equal and bilateral Secured at: 32 cm Tube secured with: Tape Dental Injury: Teeth and Oropharynx as per pre-operative assessment

## 2017-10-18 NOTE — Op Note (Signed)
CARDIOTHORACIC SURGERY OPERATIVE NOTE  Date of Procedure:  10/18/2017  Preoperative Diagnosis:   Severe Aortic Stenosis   Non-ischemic Cardiomyopathy  Postoperative Diagnosis: Same   Procedure:    Aortic Valve Replacement  [place] Magna Ease Pericardial Tissue Valve (size 25 mm, model # 3300TFX, serial # [id number])   Surgeon: Valentina Gu. Roxy Manns, MD  Assistant: Nicholes Rough, PA-C  Anesthesia: Roberts Gaudy, MD  Operative Findings:  [place] type I bicuspid aortic valve with severe aortic stenosis  Dilated left ventricle with severe global left ventricular systolic dysfunction, EF <57%  Severe calcification of aortic valve and aortic annulus, extending into interventricular septum, intervalvular fibrosa and anterior leaflet of the mitral valve        BRIEF CLINICAL NOTE AND INDICATIONS FOR SURGERY  Patient is a 66 year old male with long-standing history of heart murmur, recently discovered severe aortic stenosis, nonischemic cardiomyopathy, and COPD with ongoing tobacco abuse who has been referred for second surgical opinion to discuss treatment options for management of severe symptomatic aortic stenosis. Patient states that he was told he had a heart murmur more than 20 years ago. He was never formallyevaluated by a cardiologist nor had he undergone transthoracic echocardiogram until recently. He describes a long history of exertional shortness of breath and fatigue that has progressed slowly over the last few years. Approximately 6 weeks ago he developed sudden onset of severe shortness of breath for which he presented acutely on September 01, 2017 inan acute hypoxemic respiratory failure requiring intubation and mechanical ventilation. He was diagnosed with acute viral pneumonia secondary to parainfluenza virus complicated by the presence of underlying severe aortic stenosis with severe left ventricular systolic dysfunction and COPD. During his hospitalization he underwent  left and right heart catheterization on September 06, 2017 revealing mild nonobstructive coronary artery disease with relatively low filling pressures and mildly reduced cardiac output. He was evaluated by Dr. Burt Knack from the multidisciplinary heart valve team and plans were made for delayed referral for surgical evaluation after CT angiography. The patient was seen recently by Dr. Adan Sis felt that the patient might best be treated with conventional surgery presuming that his cardiac function had improved since hospital discharge. The patient was referred for second surgical opinion.  The patient has been seen in consultation and counseled at length regarding the indications, risks and potential benefits of surgery.  All questions have been answered, and the patient provides full informed consent for the operation as described.      DETAILS OF THE OPERATIVE PROCEDURE  Preparation:  The patient is brought to the operating room on the above mentioned date and central monitoring was established by the anesthesia team including placement of Swan-Ganz catheter and radial arterial line.  Baseline pulmonary artery pressures were moderately elevated.  The patient is placed in the supine position on the operating table.  Intravenous antibiotics are administered. General endotracheal anesthesia is induced uneventfully. A Foley catheter is placed.  Baseline transesophageal echocardiogram was performed.  Findings were notable for severe left ventricular chamber enlargement with severe global left ventricular systolic dysfunction.  Ejection fraction was estimated less than 50%.  The aortic valve was severely calcified and functionally bicuspid.  The fused left and right leaflets were heavily calcified and essentially immobile.  The noncoronary leaflet was severely thickened and restricted in mobility.  There was minimal mitral regurgitation and no aortic insufficiency.  The patient's chest, abdomen, both  groins, and both lower extremities are prepared and draped in a sterile manner. A time out procedure is  performed.   Surgical Approach:  A median sternotomy incision was performed and the pericardium is opened. The ascending aorta has some calcification in the posterior wall and the distal ascending aorta but the remainder is reasonably normal in appearance.   Extracorporeal Cardiopulmonary Bypass and Myocardial Protection:  The ascending aorta and the right atrium are cannulated for cardiopulmonary bypass.  Adequate heparinization is verified.   A retrograde cardioplegia cannula is placed through the right atrium into the coronary sinus.  The operative field was continuously flooded with carbon dioxide gas.  The entire pre-bypass portion of the operation was notable for stable hemodynamics.  Cardiopulmonary bypass was begun and the surface of the heart is inspected.  A cardioplegia cannula is placed in the ascending aorta.  A temperature probe was placed in the interventricular septum.  The patient is cooled to 34C systemic temperature.  The aortic cross clamp is applied and cardioplegia is delivered initially in an antegrade fashion through the aortic root using modified [place] cold blood cardioplegia ([place] blood cardioplegia protocol).   The initial cardioplegic arrest is rapid with early diastolic arrest.  Myocardial protection was felt to be excellent.   Aortic Valve Replacement:  An oblique transverse aortotomy incision was performed.  The aortic valve was inspected and notable for [place] type I bicuspid aortic valve with severe aortic stenosis.  The fused left and right leaflets of the valve were extremely calcified and completely immobile.  The noncoronary leaflet was moderate to severely calcified and fibrotic.  There was a very large bar of calcification that extended through the aortic annulus along the commissure between the right and noncoronary leaflets, extending into  the interventricular septum and the vicinity of the AV node.  The bar of calcification also extended beneath the annulus through the intra-valvular fibrosis into the anterior leaflet of the mitral valve.  The aortic valve leaflets were excised sharply and the aortic annulus decalcified.  Decalcification was notably challenging and required extensive debridement.  The aortic annulus was sized to accept a 25 mm prosthesis.  The aortic root and left ventricle were irrigated with copious cold saline solution.  Aortic valve replacement was performed using interrupted horizontal mattress 2-0 Ethibond pledgeted sutures with pledgets in the subannular position.  An [facility] Ease pericardial tissue valve (size 25 mm, model # 3300TFX, serial # [id number]) was implanted uneventfully. The valve seated appropriately with adequate space beneath the left main and right coronary artery.   Procedure Completion:  The aortotomy was closed using a 2-layer closure of running 4-0 Prolene suture.  One final dose of warm retrograde "reanimation dose" cardioplegia was administered retrograde through the coronary sinus catheter while all air was evacuated through the aortic root.  The aortic cross clamp was removed after a total cross clamp time of 79 minutes.  Initially upon removal of the aortic cross-clamp the left ventricle becomes dilated.  With careful inspection it is noted that the left ventricular vent is malpositioned.  The vent is subsequently repositioned across the mitral valve into the left ventricle and satisfactory decompression is achieved.  Epicardial pacing wires are fixed to the right ventricular outflow tract and to the right atrial appendage. The patient is rewarmed to 37C temperature. The aortic and left ventricular vents are removed.  The patient is weaned and disconnected from cardiopulmonary bypass.  The patient's rhythm at separation from bypass was AV paced.  The patient was weaned from cardiopulmonary  bypass on milrinone at 0.5 mcg/kg/min and levophed for BP support.  Total cardiopulmonary bypass time for the operation was 128 minutes.  Followup transesophageal echocardiogram performed after separation from bypass revealed a well-seated aortic valve prosthesis that was functioning normally and without any sign of perivalvular leak.  Left ventricular function was unchanged from preoperatively.  Peak and mean transvalvular gradients across the aortic valve was estimated 9 and 4 mmHg, respectively.  The aortic and venous cannula were removed uneventfully. Protamine was administered to reverse the anticoagulation. The mediastinum and pleural space were inspected for hemostasis and irrigated with saline solution. The mediastinum was drained using 2 chest tubes placed through separate stab incisions inferiorly.  The soft tissues anterior to the aorta were reapproximated loosely. The sternum is closed with double strength sternal wire. The soft tissues anterior to the sternum were closed in multiple layers and the skin is closed with a running subcuticular skin closure.  The post-bypass portion of the operation was notable for stable rhythm and hemodynamics.  No blood products were administered during the operation.   Disposition:  The patient tolerated the procedure well and is transported to the surgical intensive care in stable condition. There are no intraoperative complications. All sponge instrument and needle counts are verified correct at completion of the operation.    Valentina Gu. Roxy Manns MD 10/18/2017 1:29 PM

## 2017-10-18 NOTE — Progress Notes (Signed)
Called Dr Roxy Manns regarding post-op arrival ABG, see CHL results, orders for vent changes (incr. rate to 18), 1 amp bicarb, respiratory to do recruitment maneuver and recheck ABG 1 hr. Orders placed and implemented. Will continue to monitor.

## 2017-10-18 NOTE — Anesthesia Procedure Notes (Signed)
Arterial Line Insertion Start/End7/15/2018 10:14 AM Performed by: Verdie Drown, CRNA, CRNA  Preanesthetic checklist: patient identified, IV checked, site marked, risks and benefits discussed, surgical consent, monitors and equipment checked, pre-op evaluation, timeout performed and anesthesia consent Lidocaine 1% used for infiltration and patient sedated Right, radial was placed Catheter size: 20 G Hand hygiene performed  and maximum sterile barriers used  Allen's test indicative of satisfactory collateral circulation Attempts: 2 (attempt by K. White, CRNA on left radial x1) Procedure performed without using ultrasound guided technique. Following insertion, dressing applied and Biopatch. Post procedure assessment: normal  Patient tolerated the procedure well with no immediate complications.

## 2017-10-18 NOTE — Transfer of Care (Signed)
Immediate Anesthesia Transfer of Care Note  Patient: Kenneth Mills  Procedure(s) Performed: AORTIC VALVE REPLACEMENT (N/A Chest) TRANSESOPHAGEAL ECHOCARDIOGRAM (TEE) (N/A ) STERNOTOMY  Patient Location: ICU  Anesthesia Type:General  Level of Consciousness: Patient remains intubated per anesthesia plan  Airway & Oxygen Therapy: Patient remains intubated per anesthesia plan and Patient placed on Ventilator (see vital sign flow sheet for setting)  Post-op Assessment: Report given to RN and Post -op Vital signs reviewed and stable  Post vital signs: Reviewed and stable  Last Vitals:  Vitals:   10/18/17 0635  BP: 117/82  Pulse: 72  Resp: 20  Temp: 36.8 C  SpO2: 98%    Last Pain: There were no vitals filed for this visit.       Complications: No apparent anesthesia complications

## 2017-10-18 NOTE — Progress Notes (Signed)
Patient ID: Kenneth Mills, male   DOB: [birth year], 66 y.o.   MRN: [id number]  SICU Evening Rounds:   Hemodynamically stable on milrinone 0.5, levophed 10, neo 40 CI = 2.1  Has started to wake up on vent.  Urine output good  CT output low  CBC    Component Value Date/Time   WBC 16.2 (H) 10/18/2017 1403   RBC 4.14 (L) 10/18/2017 1403   HGB 12.6 (L) 10/18/2017 1411   HCT 37.0 (L) 10/18/2017 1411   PLT 87 (L) 10/18/2017 1403   MCV 95.2 10/18/2017 1403   MCH 31.4 10/18/2017 1403   MCHC 33.0 10/18/2017 1403   RDW 14.6 10/18/2017 1403   LYMPHSABS 2.5 09/08/2017 0648   MONOABS 0.7 09/08/2017 0648   EOSABS 0.0 09/08/2017 0648   BASOSABS 0.0 09/08/2017 0648     BMET    Component Value Date/Time   NA 144 10/18/2017 1411   K 4.7 10/18/2017 1411   CL 105 10/18/2017 1251   CO2 19 (L) 10/17/2017 1132   GLUCOSE 129 (H) 10/18/2017 1411   BUN 15 10/18/2017 1251   CREATININE 0.80 10/18/2017 1251   CALCIUM 9.2 10/17/2017 1132   GFRNONAA >60 10/17/2017 1132   GFRAA >60 10/17/2017 1132     A/P:  Stable postop course. Continue current plans

## 2017-10-18 NOTE — Interval H&P Note (Signed)
History and Physical Interval Note:  10/18/2017 6:48 AM  Kenneth Mills  has presented today for surgery, with the diagnosis of Aortic Stenosis  The various methods of treatment have been discussed with the patient and family. After consideration of risks, benefits and other options for treatment, the patient has consented to  Procedure(s): MINIMALLY INVASIVE AORTIC VALVE REPLACEMENT (AVR)MINIMALLY INVASIVE THORACOTOMY,TEE (N/A) MINIMALLY INVASIVE AORTIC VALVE REPLACEMENT,THORACIC APPROACH (N/A) TRANSESOPHAGEAL ECHOCARDIOGRAM (TEE) (N/A) as a surgical intervention .  The patient's history has been reviewed, patient examined, no change in status, stable for surgery.  I have reviewed the patient's chart and labs.  Questions were answered to the patient's satisfaction.     Rexene Alberts

## 2017-10-18 NOTE — Anesthesia Procedure Notes (Signed)
Central Venous Catheter Insertion Performed by: Roberts Gaudy, MD, anesthesiologist Start/End12/10/2017 7:55 AM, 10/18/2017 8:10 AM Patient location: Pre-op. Position: supine Hand hygiene performed  and maximum sterile barriers used  Catheter size: 9 Fr MAC introducer Swan type:thermodilution Procedure performed using ultrasound guided technique. Attempts: 1

## 2017-10-18 NOTE — Progress Notes (Signed)
Attempted to start rapid wean protocol on pt who followed commands.  Pts back-up rate kicked in due to a respiratory rate of 8.  Will attempt wean again at a later time once pt has had more time to wake-up.

## 2017-10-18 NOTE — Progress Notes (Signed)
Rapid wean attempted with respiratory therapy. Pt not breathing enough on own, RR low, pt not alert enough. Attempt aborted. Vent settings changed back to 50% FiO2, 5 PEEP, SIMV+PS rate of 18. Will continue to monitor.  Sherlie Ban, RN

## 2017-10-19 ENCOUNTER — Inpatient Hospital Stay (HOSPITAL_COMMUNITY): Payer: Medicare HMO

## 2017-10-19 DIAGNOSIS — I350 Nonrheumatic aortic (valve) stenosis: Principal | ICD-10-CM

## 2017-10-19 DIAGNOSIS — Z953 Presence of xenogenic heart valve: Secondary | ICD-10-CM

## 2017-10-19 LAB — CBC
HCT: 39.4 % (ref 39.0–52.0)
HEMATOCRIT: 39 % (ref 39.0–52.0)
HEMOGLOBIN: 12.9 g/dL — AB (ref 13.0–17.0)
HEMOGLOBIN: 12.8 g/dL — AB (ref 13.0–17.0)
MCH: 31.2 pg (ref 26.0–34.0)
MCH: 31.2 pg (ref 26.0–34.0)
MCHC: 32.7 g/dL (ref 30.0–36.0)
MCHC: 32.8 g/dL (ref 30.0–36.0)
MCV: 95.2 fL (ref 78.0–100.0)
MCV: 95.1 fL (ref 78.0–100.0)
PLATELETS: 84 10*3/uL — AB (ref 150–400)
Platelets: 60 10*3/uL — ABNORMAL LOW (ref 150–400)
RBC: 4.14 MIL/uL — AB (ref 4.22–5.81)
RBC: 4.1 MIL/uL — AB (ref 4.22–5.81)
RDW: 14.6 % (ref 11.5–15.5)
RDW: 14.8 % (ref 11.5–15.5)
WBC: 15.6 10*3/uL — AB (ref 4.0–10.5)
WBC: 13 10*3/uL — AB (ref 4.0–10.5)

## 2017-10-19 LAB — POCT I-STAT 3, ART BLOOD GAS (G3+)
ACID-BASE DEFICIT: 5 mmol/L — AB (ref 0.0–2.0)
Acid-base deficit: 5 mmol/L — ABNORMAL HIGH (ref 0.0–2.0)
BICARBONATE: 20.6 mmol/L (ref 20.0–28.0)
Bicarbonate: 21 mmol/L (ref 20.0–28.0)
O2 Saturation: 97 %
O2 Saturation: 95 %
PH ART: 7.315 — AB (ref 7.350–7.450)
TCO2: 22 mmol/L (ref 22–32)
TCO2: 22 mmol/L (ref 22–32)
pCO2 arterial: 41.4 mmHg (ref 32.0–48.0)
pCO2 arterial: 39.6 mmHg (ref 32.0–48.0)
pH, Arterial: 7.326 — ABNORMAL LOW (ref 7.350–7.450)
pO2, Arterial: 98 mmHg (ref 83.0–108.0)
pO2, Arterial: 81 mmHg — ABNORMAL LOW (ref 83.0–108.0)

## 2017-10-19 LAB — GLUCOSE, CAPILLARY
GLUCOSE-CAPILLARY: 113 mg/dL — AB (ref 65–99)
GLUCOSE-CAPILLARY: 116 mg/dL — AB (ref 65–99)
GLUCOSE-CAPILLARY: 123 mg/dL — AB (ref 65–99)
GLUCOSE-CAPILLARY: 104 mg/dL — AB (ref 65–99)
GLUCOSE-CAPILLARY: 120 mg/dL — AB (ref 65–99)
GLUCOSE-CAPILLARY: 125 mg/dL — AB (ref 65–99)
GLUCOSE-CAPILLARY: 147 mg/dL — AB (ref 65–99)
GLUCOSE-CAPILLARY: 160 mg/dL — AB (ref 65–99)
Glucose-Capillary: 108 mg/dL — ABNORMAL HIGH (ref 65–99)
Glucose-Capillary: 117 mg/dL — ABNORMAL HIGH (ref 65–99)
Glucose-Capillary: 108 mg/dL — ABNORMAL HIGH (ref 65–99)
Glucose-Capillary: 105 mg/dL — ABNORMAL HIGH (ref 65–99)
Glucose-Capillary: 99 mg/dL (ref 65–99)
Glucose-Capillary: 106 mg/dL — ABNORMAL HIGH (ref 65–99)
Glucose-Capillary: 113 mg/dL — ABNORMAL HIGH (ref 65–99)

## 2017-10-19 LAB — COOXEMETRY PANEL
CARBOXYHEMOGLOBIN: 1.3 % (ref 0.5–1.5)
METHEMOGLOBIN: 1.2 % (ref 0.0–1.5)
O2 Saturation: 60.1 %
Total hemoglobin: 12 g/dL (ref 12.0–16.0)

## 2017-10-19 LAB — BASIC METABOLIC PANEL
ANION GAP: 7 (ref 5–15)
BUN: 13 mg/dL (ref 6–20)
CHLORIDE: 110 mmol/L (ref 101–111)
CO2: 23 mmol/L (ref 22–32)
Calcium: 8.2 mg/dL — ABNORMAL LOW (ref 8.9–10.3)
Creatinine, Ser: 0.94 mg/dL (ref 0.61–1.24)
GFR calc Af Amer: >60 mL/min (ref >60)
GLUCOSE: 115 mg/dL — AB (ref 65–99)
POTASSIUM: 4.4 mmol/L (ref 3.5–5.1)
SODIUM: 140 mmol/L (ref 135–145)

## 2017-10-19 LAB — POCT I-STAT, CHEM 8
BUN: 15 mg/dL (ref 6–20)
CREATININE: 0.8 mg/dL (ref 0.61–1.24)
Calcium, Ion: 1.14 mmol/L — ABNORMAL LOW (ref 1.15–1.40)
Chloride: 105 mmol/L (ref 101–111)
GLUCOSE: 153 mg/dL — AB (ref 65–99)
HCT: 39 % (ref 39.0–52.0)
HEMOGLOBIN: 13.3 g/dL (ref 13.0–17.0)
Potassium: 4.4 mmol/L (ref 3.5–5.1)
Sodium: 142 mmol/L (ref 135–145)
TCO2: 26 mmol/L (ref 22–32)

## 2017-10-19 LAB — MAGNESIUM
MAGNESIUM: 2.4 mg/dL (ref 1.7–2.4)
MAGNESIUM: 2.1 mg/dL (ref 1.7–2.4)

## 2017-10-19 LAB — CREATININE, SERUM: Creatinine, Ser: 0.99 mg/dL (ref 0.61–1.24)

## 2017-10-19 MED ORDER — ATORVASTATIN CALCIUM 20 MG PO TABS
20.0000 mg | ORAL_TABLET | Freq: Every day | ORAL | Status: DC
  Administered 2017-10-20 – 2017-10-26 (×7): 20 mg via ORAL
  Filled 2017-10-19 (×8): qty 1

## 2017-10-19 MED ORDER — IPRATROPIUM-ALBUTEROL 0.5-2.5 (3) MG/3ML IN SOLN: 3.0000 mL | Freq: Four times a day (QID) | RESPIRATORY_TRACT | Status: DC | PRN

## 2017-10-19 MED ORDER — FUROSEMIDE 10 MG/ML IJ SOLN
20.0000 mg | Freq: Four times a day (QID) | INTRAMUSCULAR | Status: AC
  Administered 2017-10-19 – 2017-10-20 (×3): 20 mg via INTRAVENOUS
  Filled 2017-10-19 (×3): qty 2

## 2017-10-19 MED ORDER — ORAL CARE MOUTH RINSE
15.0000 mL | Freq: Two times a day (BID) | OROMUCOSAL | Status: DC
  Administered 2017-10-19 – 2017-10-23 (×6): 15 mL via OROMUCOSAL

## 2017-10-19 MED ORDER — INSULIN ASPART 100 UNIT/ML ~~LOC~~ SOLN
0.0000 [IU] | SUBCUTANEOUS | Status: DC
  Administered 2017-10-19: 4 [IU] via SUBCUTANEOUS
  Administered 2017-10-19 – 2017-10-20 (×3): 2 [IU] via SUBCUTANEOUS

## 2017-10-19 MED ORDER — FUROSEMIDE 10 MG/ML IJ SOLN
20.0000 mg | Freq: Once | INTRAMUSCULAR | Status: AC
  Administered 2017-10-19: 20 mg via INTRAVENOUS
  Filled 2017-10-19: qty 2

## 2017-10-19 NOTE — Progress Notes (Signed)
Pt more awake at this time, RR decreased to 12 on ventilator. RT aware. Will continue to monitor.  Sherlie Ban, RN

## 2017-10-19 NOTE — Consult Note (Signed)
Advanced Heart Failure Team Consult Note   Primary Physician: Primary Cardiologist:  Dr Haroldine Laws   Reason for Consultation: Heart Failure   HPI:     Kenneth Mills is seen today for evaluation of heart failure at the request of Dr Roxy Manns.  Kenneth Mills is a 66 y.o. male with h/o COPD, IBD s/p partial colectomy, Systolic CHF, and severe aortic stenosis.  Admitted for AVR. S/P AVR 10/18/17. Remains on milrinone 0.341mg. All other drips weaned off. Todays CO-OX is 60%.    Complaining of pain.   TEE Intraop 10/18/17  Mitral valve: The mitral valve leaflets were restricted in mobility due to lateral displacement of the papillary muscles. There was a reduced zone of leaflet coaptation. There was a central jet of mitral regurgitation which was graded as mild. There was moderate mitral annular calcification which was most prominent at the base of the anterior leaflet. In the post-bypass study, there was persistent mild mitral regurgitation with a central jet.  Right ventricle: The right ventricular cavity was of normal size. There was normal appearing right ventricular systolic function.  Tricuspid valve: The tricuspid valve appeared structurally normal. There was trace to 1+ tricuspid insufficiency with a central jet.  Pulmonic valve: The pulmonic valve appeared structurally normal and there was trace pulmonic insufficiency.  Right atrium: Cavity is mildly dilated. Review of Systems: [y] = yes, [ ]  = no   General: Weight gain [Y ]; Weight loss [ ] ; Anorexia [ ] ; Fatigue [Y ]; Fever [ ] ; Chills [ ] ; Weakness [ ]   Cardiac: Chest pain/pressure [ ] ; Resting SOB [ ] ; Exertional SOB [Y ]; Orthopnea [ ] ; Pedal Edema [ ] ; Palpitations [ ] ; Syncope [ ] ; Presyncope [ ] ; Paroxysmal nocturnal dyspnea[ ]   Pulmonary: Cough [ ] ; Wheezing[ ] ; Hemoptysis[ ] ; Sputum [ ] ; Snoring [ ]   GI: Vomiting[ ] ; Dysphagia[ ] ; Melena[ ] ; Hematochezia [ ] ; Heartburn[ ] ; Abdominal pain [ ] ; Constipation [ ] ;  Diarrhea [ ] ; BRBPR [ ]   GU: Hematuria[ ] ; Dysuria [ ] ; Nocturia[ ]   Vascular: Pain in legs with walking [ ] ; Pain in feet with lying flat [ ] ; Non-healing sores [ ] ; Stroke [ ] ; TIA [ ] ; Slurred speech [ ] ;  Neuro: Headaches[ ] ; Vertigo[ ] ; Seizures[ ] ; Paresthesias[ ] ;Blurred vision [ ] ; Diplopia [ ] ; Vision changes [ ]   Ortho/Skin: Arthritis [ ] ; Joint pain [ Y]; Muscle pain [ ] ; Joint swelling [ ] ; Back Pain [ ] ; Rash [ ]   Psych: Depression[ ] ; Anxiety[ ]   Heme: Bleeding problems [ ] ; Clotting disorders [ ] ; Anemia [ ]   Endocrine: Diabetes [ ] ; Thyroid dysfunction[ ]   Home Medications Prior to Admission medications   Medication Sig Start Date End Date Taking? Authorizing Provider  acetaminophen (TYLENOL) 500 MG tablet Take 500 mg by mouth every 6 (six) hours as needed.   Yes [provider]  atorvastatin (LIPITOR) 20 MG tablet Take 1 tablet (20 mg total) by mouth daily at 6 PM. 10/05/17  Yes Zacharia Sowles, DShaune Pascal MD  clonazePAM (KLONOPIN) 0.5 MG tablet Take 0.5 mg by mouth daily as needed for anxiety.  06/05/17  Yes [provider]  digoxin (LANOXIN) 0.125 MG tablet Take 1 tablet (0.125 mg total) by mouth daily. 10/05/17  Yes Jacquelene Kopecky, DShaune Pascal MD  spironolactone (ALDACTONE) 25 MG tablet Take 25 mg by mouth daily.   Yes [provider]    Past Medical History: Past Medical History:  Diagnosis Date  . Anxiety   .  Aortic stenosis, severe    a. suspect low flow, low gradient severe AS.   [facility] Cancer (HCC)    squamous cell - right ear  . COPD (chronic obstructive pulmonary disease) ([place])   . GERD (gastroesophageal reflux disease)   . Headache   . Heart murmur   . IBD (inflammatory bowel disease)    a. s/p partial colectomy in 2009  . LBBB (left bundle branch block)   . NICM (nonischemic cardiomyopathy) ([place])    a. 08/2014: EF 15% suspect to be endstage CM 2/2 severe AS  . Pneumonia 2018  . S/P aortic valve replacement with bioprosthetic valve 10/18/2017    25 mm [facility] Ease stented bovine pericardial tissue valve  . Tobacco abuse     Past Surgical History: Past Surgical History:  Procedure Laterality Date  . COLON SURGERY     bowel resection, for blockage  . HERNIA REPAIR Left    left inguinal  . IR FLUORO GUIDE CV MIDLINE PICC LEFT  09/04/2017  . RIGHT/LEFT HEART CATH AND CORONARY ANGIOGRAPHY N/A 09/06/2017   Procedure: RIGHT/LEFT HEART CATH AND CORONARY ANGIOGRAPHY;  Surgeon: Jolaine Artist, MD;  Location: [facility];  Service: Cardiovascular;  Laterality: N/A;    Family History: Family History  Problem Relation Age of Onset  . Hypertension Father   . Heart attack Father   . Lung cancer Brother     Social History: Social History   Socioeconomic History  . Marital status: Married    Spouse name: None  . Number of children: None  . Years of education: None  . Highest education level: None  Social Needs  . Financial resource strain: None  . Food insecurity - worry: None  . Food insecurity - inability: None  . Transportation needs - medical: None  . Transportation needs - non-medical: None  Occupational History  . None  Tobacco Use  . Smoking status: Current Every Day Smoker    Packs/day: 2.00    Types: Cigarettes    Start date: 48  . Smokeless tobacco: Former Systems developer  . Tobacco comment: as of 10/17/17 has not smoked for 26 hours  Substance and Sexual Activity  . Alcohol use: Yes    Comment: rarely  . Drug use: No  . Sexual activity: None  Other Topics Concern  . None  Social History Narrative  . None    Allergies:  No Known Allergies  Objective:    Vital Signs:   Temp:  [97.3 F (36.3 C)-100 F (37.8 C)] 99 F (37.2 C) (12/13 0800) Pulse Rate:  [47-105] 93 (12/13 0935) Resp:  [0-29] 27 (12/13 0935) BP: (87-137)/(64-83) 111/65 (12/13 0900) SpO2:  [90 %-99 %] 91 % (12/13 0935) Arterial Line BP: (67-145)/(48-70) 120/58 (12/13 0900) FiO2 (%):  [40 %-50 %] 40 % (12/13 0050) Weight:   [166 lb 14.2 oz (75.7 kg)-175 lb 4.3 oz (79.5 kg)] 175 lb 4.3 oz (79.5 kg) (12/13 0600) Last BM Date: (PTA)  Weight change: Filed Weights   10/18/17 1405 10/19/17 0600  Weight: 166 lb 14.2 oz (75.7 kg) 175 lb 4.3 oz (79.5 kg)    Intake/Output:   Intake/Output Summary (Last 24 hours) at 10/19/2017 0938 Last data filed at 10/19/2017 0900 Gross per 24 hour  Intake 7574.91 ml  Output 5365 ml  Net 2209.91 ml      Physical Exam   CVP 12 General:  No resp difficulty HEENT: normal Neck: RIJ swan . JVP 9-10 . Carotids 2+ bilat;  no bruits. No lymphadenopathy or thyromegaly appreciated. Cor: PMI nondisplaced. Regular rate & rhythm. No rubs, gallops or murmurs. Sternall dressing in tact  Lungs: decreased on 4 liters oxygen.  Abdomen: soft, nontender, nondistended. No hepatosplenomegaly. No bruits or masses. Good bowel sounds. Extremities: no cyanosis, clubbing, rash, edema. R and LLE SCDs.  Neuro: alert & orientedx3, cranial nerves grossly intact. moves all 4 extremities w/o difficulty. Affect pleasant   Telemetry   NSR 90s personally reviewed.   EKG    NSR 96 bpm   Labs   Basic Metabolic Panel: Recent Labs  Lab 10/17/17 1132  10/18/17 1119 10/18/17 1204 10/18/17 1251 10/18/17 1411 10/18/17 1924 10/18/17 1936 10/19/17 0500  NA 137   < > 137 137 141 144  --  143 140  K 4.1   < > 5.5* 5.8* 4.5 4.7  --  4.2 4.4  CL 108   < > 104 105 105  --   --  109 110  CO2 19*  --   --   --   --   --   --   --  23  GLUCOSE 113*   < > 124* 157* 131* 129*  --  140* 115*  BUN 13   < > 16 17 15   --   --  12 13  CREATININE 0.92   < > 0.90 0.90 0.80  --  0.97 0.90 0.94  CALCIUM 9.2  --   --   --   --   --   --   --  8.2*  MG  --   --   --   --   --   --  3.1*  --  2.4   < > = values in this interval not displayed.    Liver Function Tests: Recent Labs  Lab 10/17/17 1132  AST 22  ALT 16*  ALKPHOS 69  BILITOT 1.2  PROT 6.6  ALBUMIN 4.1   No results for input(s): LIPASE, AMYLASE  in the last 168 hours. No results for input(s): AMMONIA in the last 168 hours.  CBC: Recent Labs  Lab 10/17/17 1132  10/18/17 1135  10/18/17 1403 10/18/17 1411 10/18/17 1924 10/18/17 1936 10/19/17 0500  WBC 8.5  --   --   --  16.2*  --  12.9*  --  15.6*  HGB 16.2   < > 12.1*   < > 13.0 12.6* 12.4* 11.9* 12.9*  HCT 47.6   < > 37.1*   < > 39.4 37.0* 37.3* 35.0* 39.4  MCV 93.5  --   --   --  95.2  --  94.7  --  95.2  PLT 130*  --  90*  --  87*  --  87*  --  84*   < > = values in this interval not displayed.    Cardiac Enzymes: No results for input(s): CKTOTAL, CKMB, CKMBINDEX, TROPONINI in the last 168 hours.  BNP: BNP (last 3 results) Recent Labs    09/01/17 2155 09/03/17 0139  BNP 885.5* 1,167.7*    ProBNP (last 3 results) No results for input(s): PROBNP in the last 8760 hours.   CBG: Recent Labs  Lab 10/19/17 0454 10/19/17 0557 10/19/17 0700 10/19/17 0809 10/19/17 0911  GLUCAP 116* 105* 123* 104* 99    Coagulation Studies: Recent Labs    10/18/17 0642 10/18/17 1403  LABPROT 13.5 17.6*  INR 1.04 1.46     Imaging   Dg Chest [facility] 1 View  Result  Date: 10/19/2017 CLINICAL DATA:  66 year old male postoperative day 1 aortic valve replacement. EXAM: PORTABLE CHEST 1 VIEW COMPARISON:  10/18/2017 and earlier. FINDINGS: Portable AP semi upright view at 0609 hours. Extubated. Enteric tube removed. Stable mediastinal drain. Stable left IJ approach Swan-Ganz catheter, tip at the right main pulmonary artery. Epicardial pacer wires remain. Stable lung volumes and pulmonary vascularity. No pneumothorax or overt edema. Patchy retrocardiac opacity is stable. IMPRESSION: 1. Extubated and enteric tube removed. Otherwise stable lines and tubes. 2. Stable ventilation with basilar atelectasis and pulmonary vascular congestion without overt edema. Electronically Signed   By: Genevie Ann M.D.   On: 10/19/2017 08:05   Dg Chest Port 1 View  Result Date: 10/18/2017 CLINICAL DATA:   Status post aortic valve repair. EXAM: PORTABLE CHEST 1 VIEW COMPARISON:  Radiographs of October 17, 2017. FINDINGS: Stable cardiomediastinal silhouette. Atherosclerosis of thoracic aorta is noted. Aortic valve prosthesis is noted. Endotracheal and nasogastric tubes are in grossly good position. Left internal jugular Swan-Ganz catheter is noted with tip directed into right pulmonary artery. No pneumothorax or pleural effusion is noted. Mild bibasilar subsegmental atelectasis is noted. Bony thorax is unremarkable. IMPRESSION: Status post aortic valve replacement. Endotracheal and nasogastric tubes are in grossly good position. Mild bibasilar subsegmental atelectasis. Electronically Signed   By: Marijo Conception, M.D.   On: 10/18/2017 14:38      Medications:     Current Medications: . acetaminophen  1,000 mg Oral Q6H  . aspirin EC  325 mg Oral Daily  . bisacodyl  10 mg Oral Daily   Or  . bisacodyl  10 mg Rectal Daily  . Chlorhexidine Gluconate Cloth  6 each Topical Daily  . docusate sodium  200 mg Oral Daily  . insulin aspart  0-24 Units Subcutaneous Q4H  . insulin regular  0-10 Units Intravenous TID WC  . mouth rinse  15 mL Mouth Rinse BID  . [START ON 10/20/2017] pantoprazole  40 mg Oral Daily  . sodium chloride flush  10-40 mL Intracatheter Q12H  . sodium chloride flush  3 mL Intravenous Q12H     Infusions: . sodium chloride    . albumin human Stopped (10/18/17 1630)  . cefUROXime (ZINACEF)  IV Stopped (10/19/17 0530)  . insulin (NOVOLIN-R) infusion 1.2 Units/hr (10/19/17 0912)  . lactated ringers 10 mL/hr at 10/19/17 0700  . lactated ringers 10 mL/hr at 10/19/17 0700  . milrinone 0.375 mcg/kg/min (10/19/17 0700)  . norepinephrine (LEVOPHED) Adult infusion Stopped (10/19/17 0913)  . phenylephrine (NEO-SYNEPHRINE) Adult infusion Stopped (10/19/17 0530)       Patient Profile    Kenneth Mills is a 66 y.o. male with h/o COPD, IBD s/p partial colectomy, Systolic CHF, and  severe aortic stenosis.  Admitted for AVR.   Assessment/Plan   1. S/P AVR  Off all drips except milrinone 0.3 mcg. CO-OX 60% On aspirin 325 mg daily  2. Chronic Systolic Heart Failure  ECHO 1//2018 EF 15%.  Check CVP. If elevated will give 40 mg IV lasix x1.  --> CVP 12. Give 20 mg IV lasix x1.  Hold bb/arb for now.   3. CAD Mild non-obstructive.  Restart statin  4. Pulmonary Nodules  Noted on CT 09/2017 Repeat CT 3-6 months  5. Thrombocytopenia Plts 84. Watch CBC daily.    Medication concerns reviewed with patient and pharmacy team. Barriers identified:  Length of Stay: [place], NP  10/19/2017, 9:38 AM  Advanced Heart Failure Team Pager [phone] (M-F; 7a -  4p)  Please contact [facility] for night-coverage after hours (4p -7a ) and weekends on [email or web address]  Patient seen and examined with Darrick Grinder, NP. We discussed all aspects of the encounter. I agree with the assessment and plan as stated above.   He is doing remarkably well POD #1 AVR despite very low EF. Hemodynamics ok. Renal function stable Agree with gentle diuresis. Will continue milrinone for now. Given degree of calcium debulking aroung AoV, I am amazed that his rhythm is so stable. Will continue to follow closely. D/w Dr. Roxy Manns at bedside.  Glori Bickers, MD  2:40 PM

## 2017-10-19 NOTE — Anesthesia Postprocedure Evaluation (Signed)
Anesthesia Post Note  Patient: Kenneth Mills  Procedure(s) Performed: AORTIC VALVE REPLACEMENT (N/A Chest) TRANSESOPHAGEAL ECHOCARDIOGRAM (TEE) (N/A ) STERNOTOMY     Patient location during evaluation: SICU Anesthesia Type: General Level of consciousness: sedated and patient remains intubated per anesthesia plan Vital Signs Assessment: post-procedure vital signs reviewed and stable Respiratory status: patient remains intubated per anesthesia plan and patient on ventilator - see flowsheet for VS Anesthetic complications: no    Last Vitals:  Vitals:   10/19/17 0400 10/19/17 0500  BP: 129/74 124/81  Pulse: 98 99  Resp: (!) 21 (!) 23  Temp: 37.2 C 37.3 C  SpO2: 95% 96%    Last Pain:  Vitals:   10/19/17 0400  TempSrc: Core  PainSc:                  Latrell Potempa COKER

## 2017-10-19 NOTE — Progress Notes (Signed)
[facility] BRIEF SICU PROGRESS NOTE  1 Day Post-Op  S/P Procedure(s) (LRB): AORTIC VALVE REPLACEMENT (N/A) TRANSESOPHAGEAL ECHOCARDIOGRAM (TEE) (N/A) STERNOTOMY   Stable day.  Ambulated remarkably well NSR w/ stable BP on milrinone 0.375 Breathing comfortably w/ O2 sats 93% on 4 L/min Diuresing some  Plan: Continue current plan  Rexene Alberts, MD 10/19/2017 8:04 PM

## 2017-10-19 NOTE — Procedures (Signed)
Extubation Procedure Note  Patient Details:   Name: Kenneth Mills DOB: 1951/04/03 MRN: [id number]   Airway Documentation:  Patient extubated to 4 lpm nasal cannula.  VC 976m, NIF -35, patient able to follow commands and hold head off bed.  Patient able to breathe around deflated cuff and speak post procedure.  Tolerated well, no complications.  Evaluation  O2 sats: stable throughout Complications: No apparent complications Patient did tolerate procedure well. Bilateral Breath Sounds: Clear   Yes  Eliseo Withers P 10/19/2017, 1:30 AM

## 2017-10-19 NOTE — Progress Notes (Signed)
Rapid wean protocol initiated at this time. Will continue to closely monitor.  Sherlie Ban, RN

## 2017-10-19 NOTE — Care Management Note (Addendum)
Case Management Note Marvetta Gibbons RN, BSN Unit 4E-Case Manager-- [place] coverage [phone]  Patient Details  Name: Kenneth Mills MRN: [id number] Date of Birth: 06-Jun-1951  Subjective/Objective:  Pt admitted s/p AVR on 12/12- with post op HF- remains on IV milrinone                 Action/Plan: PTA pt lived at home- independent- CM to follow for transition needs-   Expected Discharge Date:                  Expected Discharge Plan:  Home/Self Care  In-House Referral:     Discharge planning Services  CM Consult  Post Acute Care Choice:    Choice offered to:     DME Arranged:    DME Agency:     HH Arranged:    [place] Agency:     Status of Service:  In process, will continue to follow  If discussed at Long Length of Stay Meetings, dates discussed:    Discharge Disposition:   Additional Comments:  Dawayne Patricia, RN 10/19/2017, 2:20 PM

## 2017-10-19 NOTE — Progress Notes (Addendum)
[address]       [place] [address]             [phone]        CARDIOTHORACIC SURGERY PROGRESS NOTE   R1 Day Post-Op Procedure(s) (LRB): AORTIC VALVE REPLACEMENT (N/A) TRANSESOPHAGEAL ECHOCARDIOGRAM (TEE) (N/A) STERNOTOMY  Subjective: Looks good.  Minimal soreness in chest.  Denies SOB  Objective: Vital signs: BP Readings from Last 1 Encounters:  10/19/17 115/80   Pulse Readings from Last 1 Encounters:  10/19/17 96   Resp Readings from Last 1 Encounters:  10/19/17 (!) 27   Temp Readings from Last 1 Encounters:  10/19/17 99 F (37.2 C)    Hemodynamics: PAP: (18-39)/(10-26) 18/10 CO:  [2.7 L/min-5.5 L/min] 5.5 L/min CI:  [1.4 L/min/m2-2.9 L/min/m2] 2.9 L/min/m2  Physical Exam:  Rhythm:   sinus  Breath sounds: clear  Heart sounds:  RRR  Incisions:  Dressing dry, intact  Abdomen:  Soft, non-distended, non-tender  Extremities:  Warm, well-perfused  Chest tubes:  low volume thin serosanguinous output, no air leak    Intake/Output from previous day: 12/12 0701 - 12/13 0700 In: 7418.2 [I.V.:5378.2; Blood:250; NG/GT:40; IV Piggyback:1750] Out: 4967 [Urine:3665; Emesis/NG output:75; Blood:800; Chest Tube:690] Intake/Output this shift: No intake/output data recorded.  Lab Results:  CBC: Recent Labs    10/18/17 1924 10/18/17 1936 10/19/17 0500  WBC 12.9*  --  15.6*  HGB 12.4* 11.9* 12.9*  HCT 37.3* 35.0* 39.4  PLT 87*  --  84*    BMET:  Recent Labs    10/17/17 1132  10/18/17 1936 10/19/17 0500  NA 137   < > 143 140  K 4.1   < > 4.2 4.4  CL 108   < > 109 110  CO2 19*  --   --  23  GLUCOSE 113*   < > 140* 115*  BUN 13   < > 12 13  CREATININE 0.92   < > 0.90 0.94  CALCIUM 9.2  --   --  8.2*   < > = values in this interval not displayed.     PT/INR:   Recent Labs    10/18/17 1403  LABPROT 17.6*  INR 1.46    CBG (last 3)  Recent Labs    10/19/17 0454 10/19/17 0557 10/19/17 0700  GLUCAP 116* 105* 123*     ABG    Component Value Date/Time   PHART 7.326 (L) 10/19/2017 0233   PCO2ART 39.6 10/19/2017 0233   PO2ART 81.0 (L) 10/19/2017 0233   HCO3 20.6 10/19/2017 0233   TCO2 22 10/19/2017 0233   ACIDBASEDEF 5.0 (H) 10/19/2017 0233   O2SAT 60.1 10/19/2017 0615    CXR: PORTABLE CHEST 1 VIEW  COMPARISON:  10/18/2017 and earlier.  FINDINGS: Portable AP semi upright view at 0609 hours. Extubated. Enteric tube removed. Stable mediastinal drain. Stable left IJ approach Swan-Ganz catheter, tip at the right main pulmonary artery. Epicardial pacer wires remain.  Stable lung volumes and pulmonary vascularity. No pneumothorax or overt edema. Patchy retrocardiac opacity is stable.  IMPRESSION: 1. Extubated and enteric tube removed. Otherwise stable lines and tubes. 2. Stable ventilation with basilar atelectasis and pulmonary vascular congestion without overt edema.   Electronically Signed   By: Genevie Ann M.D.   On: 10/19/2017 08:05   EKG: NSR w/out acute ischemic changes, LBBB (old)   Assessment/Plan: S/P Procedure(s) (LRB): AORTIC VALVE REPLACEMENT (N/A) TRANSESOPHAGEAL ECHOCARDIOGRAM (TEE) (N/A) STERNOTOMY  Doing well POD1 Maintaining NSR w/  stable hemodynamics on milrinone 0/5 and low dose levophed, PA pressures low, cardiac index >>2 L/min and co-ox 60% Breathing comfortably w/ O2 sats 92-94% on 4 L/min Acute on chronic systolic CHF with expected post-op volume excess, weight reportedly 9 lbs > preop Expected post op acute blood loss anemia, mild Expected post op atelectasis, mild Post op thrombocytopenia, platelet count 84k stable COPD   Mobilize  D/C lines  Wean milrinone slowly  Wean levophed as tolerated   Rexene Alberts, MD 10/19/2017 8:14 AM

## 2017-10-20 ENCOUNTER — Encounter (HOSPITAL_COMMUNITY): Payer: Self-pay | Admitting: Thoracic Surgery (Cardiothoracic Vascular Surgery)

## 2017-10-20 ENCOUNTER — Inpatient Hospital Stay (HOSPITAL_COMMUNITY): Payer: Medicare HMO

## 2017-10-20 DIAGNOSIS — I5023 Acute on chronic systolic (congestive) heart failure: Secondary | ICD-10-CM

## 2017-10-20 LAB — GLUCOSE, CAPILLARY
GLUCOSE-CAPILLARY: 125 mg/dL — AB (ref 65–99)
GLUCOSE-CAPILLARY: 126 mg/dL — AB (ref 65–99)
GLUCOSE-CAPILLARY: 139 mg/dL — AB (ref 65–99)
GLUCOSE-CAPILLARY: 157 mg/dL — AB (ref 65–99)
Glucose-Capillary: 123 mg/dL — ABNORMAL HIGH (ref 65–99)
Glucose-Capillary: 139 mg/dL — ABNORMAL HIGH (ref 65–99)

## 2017-10-20 LAB — BASIC METABOLIC PANEL
ANION GAP: 10 (ref 5–15)
BUN: 16 mg/dL (ref 6–20)
CALCIUM: 8.6 mg/dL — AB (ref 8.9–10.3)
CO2: 24 mmol/L (ref 22–32)
Chloride: 105 mmol/L (ref 101–111)
Creatinine, Ser: 1.12 mg/dL (ref 0.61–1.24)
Glucose, Bld: 140 mg/dL — ABNORMAL HIGH (ref 65–99)
Potassium: 3.9 mmol/L (ref 3.5–5.1)
SODIUM: 139 mmol/L (ref 135–145)

## 2017-10-20 LAB — COOXEMETRY PANEL
CARBOXYHEMOGLOBIN: 1.3 % (ref 0.5–1.5)
Methemoglobin: 0.9 % (ref 0.0–1.5)
O2 Saturation: 59.8 %
Total hemoglobin: 12.3 g/dL (ref 12.0–16.0)

## 2017-10-20 LAB — CBC
HCT: 38.2 % — ABNORMAL LOW (ref 39.0–52.0)
HEMOGLOBIN: 12.5 g/dL — AB (ref 13.0–17.0)
MCH: 31.3 pg (ref 26.0–34.0)
MCHC: 32.7 g/dL (ref 30.0–36.0)
MCV: 95.7 fL (ref 78.0–100.0)
PLATELETS: 59 10*3/uL — AB (ref 150–400)
RBC: 3.99 MIL/uL — AB (ref 4.22–5.81)
RDW: 15 % (ref 11.5–15.5)
WBC: 12.2 10*3/uL — AB (ref 4.0–10.5)

## 2017-10-20 MED ORDER — SPIRONOLACTONE 12.5 MG HALF TABLET
12.5000 mg | ORAL_TABLET | Freq: Every day | ORAL | Status: DC
  Administered 2017-10-20 – 2017-10-22 (×3): 12.5 mg via ORAL
  Filled 2017-10-20 (×3): qty 1

## 2017-10-20 MED ORDER — CLONAZEPAM 0.5 MG PO TABS
0.5000 mg | ORAL_TABLET | Freq: Two times a day (BID) | ORAL | Status: DC | PRN
  Administered 2017-10-21 – 2017-10-26 (×4): 0.5 mg via ORAL
  Filled 2017-10-20 (×4): qty 1

## 2017-10-20 MED ORDER — DIGOXIN 125 MCG PO TABS
0.1250 mg | ORAL_TABLET | Freq: Every day | ORAL | Status: DC
  Administered 2017-10-20 – 2017-10-27 (×8): 0.125 mg via ORAL
  Filled 2017-10-20 (×8): qty 1

## 2017-10-20 MED ORDER — POTASSIUM CHLORIDE CRYS ER 20 MEQ PO TBCR: 40.0000 meq | EXTENDED_RELEASE_TABLET | Freq: Once | ORAL | Status: DC

## 2017-10-20 MED ORDER — MOVING RIGHT ALONG BOOK
Freq: Once | Status: DC
  Filled 2017-10-20: qty 1

## 2017-10-20 MED ORDER — LOSARTAN POTASSIUM 25 MG PO TABS
25.0000 mg | ORAL_TABLET | Freq: Every day | ORAL | Status: DC
  Administered 2017-10-20: 25 mg via ORAL
  Filled 2017-10-20: qty 1

## 2017-10-20 MED ORDER — INSULIN ASPART 100 UNIT/ML ~~LOC~~ SOLN
0.0000 [IU] | SUBCUTANEOUS | Status: DC
  Administered 2017-10-20 – 2017-10-22 (×6): 2 [IU] via SUBCUTANEOUS
  Administered 2017-10-22 – 2017-10-23 (×2): 4 [IU] via SUBCUTANEOUS

## 2017-10-20 MED FILL — Electrolyte-R (PH 7.4) Solution: INTRAVENOUS | Qty: 3000 | Status: AC

## 2017-10-20 MED FILL — Sodium Chloride IV Soln 0.9%: INTRAVENOUS | Qty: 2000 | Status: AC

## 2017-10-20 MED FILL — Sodium Bicarbonate IV Soln 8.4%: INTRAVENOUS | Qty: 50 | Status: AC

## 2017-10-20 MED FILL — Mannitol IV Soln 20%: INTRAVENOUS | Qty: 500 | Status: AC

## 2017-10-20 NOTE — Progress Notes (Signed)
[address]       [place] [address]             [phone]        CARDIOTHORACIC SURGERY PROGRESS NOTE   R2 Days Post-Op Procedure(s) (LRB): AORTIC VALVE REPLACEMENT (N/A) TRANSESOPHAGEAL ECHOCARDIOGRAM (TEE) (N/A) STERNOTOMY  Subjective: Looks good.  Denies pain, SOB  Objective: Vital signs: BP Readings from Last 1 Encounters:  10/20/17 138/80   Pulse Readings from Last 1 Encounters:  10/20/17 94   Resp Readings from Last 1 Encounters:  10/20/17 (!) 27   Temp Readings from Last 1 Encounters:  10/20/17 98.4 F (36.9 C) (Oral)    Hemodynamics: CVP:  [8 mmHg-17 mmHg] 17 mmHg  Mixed venous co-ox 60%  Physical Exam:  Rhythm:   sinus  Breath sounds: clear  Heart sounds:  RRR  Incisions:  Dressing dry, intact  Abdomen:  Soft, non-distended, non-tender  Extremities:  Warm, well-perfused    Intake/Output from previous day: 12/13 0701 - 12/14 0700 In: 5436 [P.O.:420; I.V.:570; IV Piggyback:50] Out: 2505 [Urine:2285; Chest Tube:220] Intake/Output this shift: No intake/output data recorded.  Lab Results:  CBC: Recent Labs    10/19/17 1700 10/19/17 1706 10/20/17 0440  WBC 13.0*  --  12.2*  HGB 12.8* 13.3 12.5*  HCT 39.0 39.0 38.2*  PLT 60*  --  59*    BMET:  Recent Labs    10/19/17 0500  10/19/17 1706 10/20/17 0440  NA 140  --  142 139  K 4.4  --  4.4 3.9  CL 110  --  105 105  CO2 23  --   --  24  GLUCOSE 115*  --  153* 140*  BUN 13  --  15 16  CREATININE 0.94   < > 0.80 1.12  CALCIUM 8.2*  --   --  8.6*   < > = values in this interval not displayed.     PT/INR:   Recent Labs    10/18/17 1403  LABPROT 17.6*  INR 1.46    CBG (last 3)  Recent Labs    10/20/17 0053 10/20/17 0427 10/20/17 0805  GLUCAP 139* 126* 157*    ABG    Component Value Date/Time   PHART 7.326 (L) 10/19/2017 0233   PCO2ART 39.6 10/19/2017 0233   PO2ART 81.0 (L) 10/19/2017 0233   HCO3 20.6 10/19/2017 0233   TCO2 26 10/19/2017  1706   ACIDBASEDEF 5.0 (H) 10/19/2017 0233   O2SAT 59.8 10/20/2017 0440    CXR: PORTABLE CHEST 1 VIEW  COMPARISON:  10/19/2017 .  FINDINGS: Interim removal Swan-Ganz catheter. Left IJ sheath in stable position . Mediastinal drainage catheter stable position. Prior cardiac valve replacement. Stable cardiomegaly. Low lung volumes right base atelectasis/ infiltrate. Small right pleural effusion. No pneumothorax.  IMPRESSION: 1. Interim removal Swan-Ganz catheter. Left IJ sheath in stable position. Mediastinal drainage catheter stable position.  2.  Prior cardiac valve replacement.  Stable cardiomegaly.  3. Low lung volumes with progressive right base atelectasis/ infiltrate. Small right pleural effusion.   Electronically Signed   By: Marcello Moores  Register   On: 10/20/2017 07:26   Assessment/Plan: S/P Procedure(s) (LRB): AORTIC VALVE REPLACEMENT (N/A) TRANSESOPHAGEAL ECHOCARDIOGRAM (TEE) (N/A) STERNOTOMY  Doing very well POD2 Maintaining NSR w/ stable BP on milrinone 0.375 off levophed w/ good BP, co-ox 60% Breathing comfortably w/ O2 sats 92-95% on 5 L/min Acute on chronic systolic CHF with expected post-op volume excess, diuresing, negative 1.5 L yesterday and  weight down 4 lbs but still reportedly 5 lbs > preop Expected post op acute blood loss anemia, mild Expected post op atelectasis, mild Post op thrombocytopenia, platelet count 59k stable COPD   Mobilize  D/C tubes and foley  Insert PICC and D/C sleeve  Wean milrinone slowly  Remainder of meds per advanced heart failure team   Rexene Alberts, MD 10/20/2017 8:49 AM

## 2017-10-20 NOTE — Progress Notes (Signed)
Advanced Heart Failure Rounding Note  Primary Cardiologist: Dr. Haroldine Laws   Subjective:    Admitted for AVR. S/P AVR 10/18/17.  Feeling OK this am. Getting anxious at times with mild SOB and "chest tightness". Otherwise pain well controlled.   Negative 1.4 L and down 4 lbs.   Creatinine stable at 1.12. K 3.9  Per-op Echo 10/09/17 LVEF 15-20%, Severe AS  Objective:   Weight Range: 171 lb 1.2 oz (77.6 kg) Body mass index is 23.86 kg/m.   Vital Signs:   Temp:  [97.8 F (36.6 C)-99 F (37.2 C)] 97.8 F (36.6 C) (12/14 0300) Pulse Rate:  [89-115] 94 (12/14 0700) Resp:  [23-36] 27 (12/14 0700) BP: (102-161)/(65-93) 138/80 (12/14 0700) SpO2:  [89 %-96 %] 93 % (12/14 0700) Arterial Line BP: (106-179)/(53-78) 162/78 (12/14 0600) Weight:  [171 lb 1.2 oz (77.6 kg)] 171 lb 1.2 oz (77.6 kg) (12/14 0500) Last BM Date: (PTA)  Weight change: Filed Weights   10/18/17 1405 10/19/17 0600 10/20/17 0500  Weight: 166 lb 14.2 oz (75.7 kg) 175 lb 4.3 oz (79.5 kg) 171 lb 1.2 oz (77.6 kg)    Intake/Output:   Intake/Output Summary (Last 24 hours) at 10/20/2017 0739 Last data filed at 10/20/2017 0700 Gross per 24 hour  Intake 1040.03 ml  Output 2505 ml  Net -1464.97 ml      Physical Exam    CVP 8-9 cm.  General: NAD.  HEENT: Normal Neck: Supple. JVP 8-9. Carotids 2+ bilat; no bruits. No thyromegaly or nodule noted. Cor: PMI nondisplaced. RRR, No M/G/R noted. Sternal dressing in tact.  Lungs: Diminished on 4L O2. Abdomen: Soft, non-tender, non-distended, no HSM. No bruits or masses. +BS  Extremities: No cyanosis, clubbing, or rash. BLE SCDs. No edema noted.  Neuro: Alert & orientedx3, cranial nerves grossly intact. moves all 4 extremities w/o difficulty. Affect pleasant   Telemetry    NSR 90s, Personally reviewed  EKG    No new tracing.   Labs    CBC Recent Labs    10/19/17 1700 10/19/17 1706 10/20/17 0440  WBC 13.0*  --  12.2*  HGB 12.8* 13.3 12.5*  HCT  39.0 39.0 38.2*  MCV 95.1  --  95.7  PLT 60*  --  59*   Basic Metabolic Panel Recent Labs    10/19/17 0500 10/19/17 1700 10/19/17 1706 10/20/17 0440  NA 140  --  142 139  K 4.4  --  4.4 3.9  CL 110  --  105 105  CO2 23  --   --  24  GLUCOSE 115*  --  153* 140*  BUN 13  --  15 16  CREATININE 0.94 0.99 0.80 1.12  CALCIUM 8.2*  --   --  8.6*  MG 2.4 2.1  --   --    Liver Function Tests Recent Labs    10/17/17 1132  AST 22  ALT 16*  ALKPHOS 69  BILITOT 1.2  PROT 6.6  ALBUMIN 4.1   No results for input(s): LIPASE, AMYLASE in the last 72 hours. Cardiac Enzymes No results for input(s): CKTOTAL, CKMB, CKMBINDEX, TROPONINI in the last 72 hours.  BNP: BNP (last 3 results) Recent Labs    09/01/17 2155 09/03/17 0139  BNP 885.5* 1,167.7*    ProBNP (last 3 results) No results for input(s): PROBNP in the last 8760 hours.   D-Dimer No results for input(s): DDIMER in the last 72 hours. Hemoglobin A1C Recent Labs    10/17/17 1133  HGBA1C  6.0*   Fasting Lipid Panel No results for input(s): CHOL, HDL, LDLCALC, TRIG, CHOLHDL, LDLDIRECT in the last 72 hours. Thyroid Function Tests No results for input(s): TSH, T4TOTAL, T3FREE, THYROIDAB in the last 72 hours.  Invalid input(s): FREET3  Other results:  Imaging    No results found.  Medications:    Scheduled Medications: . acetaminophen  1,000 mg Oral Q6H  . aspirin EC  325 mg Oral Daily  . atorvastatin  20 mg Oral q1800  . bisacodyl  10 mg Oral Daily   Or  . bisacodyl  10 mg Rectal Daily  . Chlorhexidine Gluconate Cloth  6 each Topical Daily  . docusate sodium  200 mg Oral Daily  . furosemide  20 mg Intravenous Q6H  . insulin regular  0-10 Units Intravenous TID WC  . mouth rinse  15 mL Mouth Rinse BID  . moving right along book   Does not apply Once  . pantoprazole  40 mg Oral Daily  . sodium chloride flush  10-40 mL Intracatheter Q12H  . sodium chloride flush  3 mL Intravenous Q12H      Infusions: . sodium chloride    . insulin (NOVOLIN-R) infusion Stopped (10/19/17 1255)  . lactated ringers 10 mL/hr at 10/19/17 0700  . lactated ringers 10 mL/hr at 10/20/17 0222  . milrinone 0.375 mcg/kg/min (10/20/17 0604)     PRN Medications:  ipratropium-albuterol, metoprolol tartrate, morphine injection, ondansetron (ZOFRAN) IV, oxyCODONE, sodium chloride flush, sodium chloride flush, traMADol  Patient Profile   Kenneth Cryer Pearmanis a 66 y.o.malewith h/o COPD, IBD s/p partial colectomy, Systolic CHF, and severe aortic stenosis.  Admitted for bioprosthetic AVR.   Assessment/Plan   1. S/P AVR 10/18/17 - Remains on milrinone 0.375 mcg/kg/min. Coox 59.3% today.  - Continue ASA 325 mg daily.   2. Chronic Systolic Heart Failure  - ECHO 10/2017 EF 15-20%. (Pre-op) - Volume status improved. CVP 8-9 - Agree with 20 mg IV lasix this am. Continue to follow fluid status.  - Hold BB for now with marginal output on milrinone.  - Add spironolactone 12.5 mg daily. - Add losartan 25 mg qhs. Consider Entresto in the furture - Add digoxin 0.125 mg daily.   3. CAD - Mild non-obstructive.  - Continue 20 mg atorvastatin.   4. Pulmonary Nodules  - Noted on CT 09/2017 - Repeat CT 3-6 months. No change.   5. Thrombocytopenia - Plts 84 -> 60 -> 59 - Continue to follow daily.   Continues to improve. Volume status improved with IV lasix yesterday. In for additional dose this am. Adding HF meds slowly as tolerated  Medication concerns reviewed with patient and pharmacy team. Barriers identified: None at this time.   Length of Stay: 2  Annamaria Helling  10/20/2017, 7:39 AM  Advanced Heart Failure Team Pager [phone] (M-F; 7a - 4p)  Please contact [facility] for night-coverage after hours (4p -7a ) and weekends on [email or web address]  Patient seen and examined with the above-signed Advanced Practice Provider and/or Housestaff. I personally reviewed laboratory  data, imaging studies and relevant notes. I independently examined the patient and formulated the important aspects of the plan. I have edited the note to reflect any of my changes or salient points. I have personally discussed the plan with the patient and/or family.  Doing very well POD #3. Co-ox 59% on milrinone 0.375. Volume status improving with lasix. Will decrease milrinone to 0.3. Continue IV diuresis with low-dose lasix at least one more day.  Agree with dig, losartan and spiro.   Will mobilize. Rhythm stable.   Glori Bickers, MD  5:02 PM

## 2017-10-20 NOTE — Addendum Note (Signed)
Addendum  created 10/20/17 0753 by Roberts Gaudy, MD   Sign clinical note

## 2017-10-20 NOTE — Progress Notes (Signed)
Anesthesiology Follow-up:  Awake and alert, sitting in chair, hemodynamically stable on milrinone 0.375 mcg/kg/min. Maintaining sinus rhythm.  VS: T- 36.6 BP- 139/75 HR- 98 RR- 25 O2 Sat -94% on 4L [place]  K-3.9 Na- 139 BUN/Cr. 16/1.12 H/H- 12.5/38.2 platelets- 59,000  Extubated 11 hours post-op.  66 year old male 2 days S/P AVR with 25 mm bioprosthetic valve for severe longstanding AS with a bicuspid aortic valve and severe LV dysfunction. Stable post-op course, wean milrinone as tolerated.  Roberts Gaudy

## 2017-10-21 DIAGNOSIS — I5043 Acute on chronic combined systolic (congestive) and diastolic (congestive) heart failure: Secondary | ICD-10-CM

## 2017-10-21 LAB — GLUCOSE, CAPILLARY
GLUCOSE-CAPILLARY: 119 mg/dL — AB (ref 65–99)
GLUCOSE-CAPILLARY: 126 mg/dL — AB (ref 65–99)
GLUCOSE-CAPILLARY: 117 mg/dL — AB (ref 65–99)
GLUCOSE-CAPILLARY: 106 mg/dL — AB (ref 65–99)
Glucose-Capillary: 110 mg/dL — ABNORMAL HIGH (ref 65–99)
Glucose-Capillary: 130 mg/dL — ABNORMAL HIGH (ref 65–99)
Glucose-Capillary: 88 mg/dL (ref 65–99)

## 2017-10-21 LAB — CBC
HEMATOCRIT: 35.2 % — AB (ref 39.0–52.0)
Hemoglobin: 11.3 g/dL — ABNORMAL LOW (ref 13.0–17.0)
MCH: 31 pg (ref 26.0–34.0)
MCHC: 32.1 g/dL (ref 30.0–36.0)
MCV: 96.7 fL (ref 78.0–100.0)
PLATELETS: 59 10*3/uL — AB (ref 150–400)
RBC: 3.64 MIL/uL — AB (ref 4.22–5.81)
RDW: 14.9 % (ref 11.5–15.5)
WBC: 10.4 10*3/uL (ref 4.0–10.5)

## 2017-10-21 LAB — COOXEMETRY PANEL
Carboxyhemoglobin: 1.4 % (ref 0.5–1.5)
Methemoglobin: 0.9 % (ref 0.0–1.5)
O2 SAT: 61.6 %
TOTAL HEMOGLOBIN: 11.7 g/dL — AB (ref 12.0–16.0)

## 2017-10-21 LAB — BASIC METABOLIC PANEL
Anion gap: 8 (ref 5–15)
BUN: 20 mg/dL (ref 6–20)
CHLORIDE: 102 mmol/L (ref 101–111)
CO2: 28 mmol/L (ref 22–32)
CREATININE: 0.82 mg/dL (ref 0.61–1.24)
Calcium: 8.4 mg/dL — ABNORMAL LOW (ref 8.9–10.3)
GFR calc non Af Amer: >60 mL/min (ref >60)
Glucose, Bld: 133 mg/dL — ABNORMAL HIGH (ref 65–99)
POTASSIUM: 3.8 mmol/L (ref 3.5–5.1)
Sodium: 138 mmol/L (ref 135–145)

## 2017-10-21 MED ORDER — SODIUM CHLORIDE 0.9% FLUSH: 10.0000 mL | INTRAVENOUS | Status: DC | PRN

## 2017-10-21 MED ORDER — LOSARTAN POTASSIUM 25 MG PO TABS
25.0000 mg | ORAL_TABLET | Freq: Two times a day (BID) | ORAL | Status: DC
  Administered 2017-10-21 – 2017-10-27 (×12): 25 mg via ORAL
  Filled 2017-10-21 (×12): qty 1

## 2017-10-21 MED ORDER — CHLORHEXIDINE GLUCONATE CLOTH 2 % EX PADS
6.0000 | MEDICATED_PAD | Freq: Every day | CUTANEOUS | Status: DC
  Administered 2017-10-22 – 2017-10-23 (×2): 6 via TOPICAL

## 2017-10-21 MED ORDER — NICOTINE 14 MG/24HR TD PT24
14.0000 mg | MEDICATED_PATCH | Freq: Every day | TRANSDERMAL | Status: DC
  Administered 2017-10-21 – 2017-10-27 (×6): 14 mg via TRANSDERMAL
  Filled 2017-10-21 (×7): qty 1

## 2017-10-21 MED ORDER — SODIUM CHLORIDE 0.9% FLUSH
10.0000 mL | Freq: Two times a day (BID) | INTRAVENOUS | Status: DC
  Administered 2017-10-22: 10 mL
  Administered 2017-10-23: 20 mL
  Administered 2017-10-24 – 2017-10-27 (×4): 10 mL

## 2017-10-21 MED ORDER — ENSURE ENLIVE PO LIQD
237.0000 mL | Freq: Two times a day (BID) | ORAL | Status: DC
  Administered 2017-10-21 – 2017-10-24 (×3): 237 mL via ORAL

## 2017-10-21 NOTE — Progress Notes (Signed)
Peripherally Inserted Central Catheter/Midline Placement  The IV Nurse has discussed with the patient and/or persons authorized to consent for the patient, the purpose of this procedure and the potential benefits and risks involved with this procedure.  The benefits include less needle sticks, lab draws from the catheter, and the patient may be discharged home with the catheter. Risks include, but not limited to, infection, bleeding, blood clot (thrombus formation), and puncture of an artery; nerve damage and irregular heartbeat and possibility to perform a PICC exchange if needed/ordered by physician.  Alternatives to this procedure were also discussed.  [employer] PICC patient education guide, fact sheet on infection prevention and patient information card has been provided to patient /or left at bedside.    PICC/Midline Placement Documentation  PICC Double Lumen 10/21/17 PICC Right Brachial 37 cm 0 cm (Active)  Indication for Insertion or Continuance of Line Vasoactive infusions 10/21/2017 10:52 AM  Exposed Catheter (cm) 0 cm 10/21/2017 10:52 AM  Site Assessment Clean;Dry;Intact 10/21/2017 10:52 AM  Lumen #1 Status Flushed;Saline locked;Blood return noted 10/21/2017 10:52 AM  Lumen #2 Status Flushed;Saline locked;Blood return noted 10/21/2017 10:52 AM  Dressing Type Transparent 10/21/2017 10:52 AM  Dressing Status Clean;Dry;Intact;Antimicrobial disc in place 10/21/2017 10:52 AM  Line Care Connections checked and tightened 10/21/2017 10:52 AM  Line Adjustment (NICU/IV Team Only) No 10/21/2017 10:52 AM  Dressing Intervention New dressing 10/21/2017 10:52 AM  Dressing Change Due 10/28/17 10/21/2017 10:52 AM       Rolena Infante 10/21/2017, 10:55 AM

## 2017-10-21 NOTE — Progress Notes (Addendum)
[facility] DAILY ICU PROGRESS NOTE                   [address]            ,[place] [address]          [phone]   3 Days Post-Op Procedure(s) (LRB): AORTIC VALVE REPLACEMENT (N/A) TRANSESOPHAGEAL ECHOCARDIOGRAM (TEE) (N/A) STERNOTOMY  Total Length of Stay:  LOS: 3 days   Subjective: Patient sitting in chair still eating dinner. He is passing flatus but no bowel movement yet. He does not want a laxative at this time.  Objective: Vital signs in last 24 hours: Temp:  [97.6 F (36.4 C)-98.3 F (36.8 C)] 97.6 F (36.4 C) (12/15 1544) Pulse Rate:  [96-103] 102 (12/15 1700) Cardiac Rhythm: Normal sinus rhythm;Bundle branch block (12/15 1155) Resp:  [13-31] 23 (12/15 1700) BP: (104-142)/(65-122) 130/80 (12/15 1700) SpO2:  [90 %-100 %] 93 % (12/15 1700) Weight:  [170 lb 13.7 oz (77.5 kg)] 170 lb 13.7 oz (77.5 kg) (12/15 0600)  Filed Weights   10/19/17 0600 10/20/17 0500 10/21/17 0600  Weight: 175 lb 4.3 oz (79.5 kg) 171 lb 1.2 oz (77.6 kg) 170 lb 13.7 oz (77.5 kg)    Weight change: -3.5 oz (-0.1 kg)   Hemodynamic parameters for last 24 hours: CVP:  [5 mmHg-17 mmHg] 5 mmHg  Intake/Output from previous day: 12/14 0701 - 12/15 0700 In: 888.1 [P.O.:480; I.V.:408.1] Out: 1810 [Urine:1780; Chest Tube:30]  Intake/Output this shift: Total I/O In: 369.8 [P.O.:240; I.V.:129.8] Out: 550 [Urine:550]  Current Meds: Scheduled Meds: . acetaminophen  1,000 mg Oral Q6H  . aspirin EC  325 mg Oral Daily  . atorvastatin  20 mg Oral q1800  . bisacodyl  10 mg Oral Daily   Or  . bisacodyl  10 mg Rectal Daily  . Chlorhexidine Gluconate Cloth  6 each Topical Daily  . digoxin  0.125 mg Oral Daily  . docusate sodium  200 mg Oral Daily  . feeding supplement (ENSURE ENLIVE)  237 mL Oral BID BM  . insulin aspart  0-24 Units Subcutaneous Q4H  . losartan  25 mg Oral BID  . mouth rinse  15 mL Mouth Rinse BID  . moving right along book   Does not apply Once  . pantoprazole  40  mg Oral Daily  . sodium chloride flush  10-40 mL Intracatheter Q12H  . sodium chloride flush  10-40 mL Intracatheter Q12H  . sodium chloride flush  3 mL Intravenous Q12H  . spironolactone  12.5 mg Oral Daily   Continuous Infusions: . sodium chloride    . lactated ringers 10 mL/hr at 10/19/17 0700  . lactated ringers 10 mL/hr at 10/20/17 0222  . milrinone 0.2 mcg/kg/min (10/21/17 1549)   PRN Meds:.clonazePAM, ipratropium-albuterol, metoprolol tartrate, morphine injection, ondansetron (ZOFRAN) IV, oxyCODONE, sodium chloride flush, sodium chloride flush, sodium chloride flush, traMADol  General appearance: alert, cooperative and no distress Heart: RRR Lungs: Slightly diminshed at bases Abdomen: Soft, non tender, spoardic bowel sounds Extremities: Warm Wound: Aquacel intact  Lab Results: CBC: Recent Labs    10/20/17 0440 10/21/17 0456  WBC 12.2* 10.4  HGB 12.5* 11.3*  HCT 38.2* 35.2*  PLT 59* 59*   BMET:  Recent Labs    10/20/17 0440 10/21/17 0456  NA 139 138  K 3.9 3.8  CL 105 102  CO2 24 28  GLUCOSE 140* 133*  BUN 16 20  CREATININE 1.12 0.82  CALCIUM 8.6* 8.4*  CMET: Lab Results  Component Value Date   WBC 10.4 10/21/2017   HGB 11.3 (L) 10/21/2017   HCT 35.2 (L) 10/21/2017   PLT 59 (L) 10/21/2017   GLUCOSE 133 (H) 10/21/2017   CHOL 145 09/07/2017   TRIG 90 09/07/2017   HDL 51 09/07/2017   LDLCALC 76 09/07/2017   ALT 16 (L) 10/17/2017   AST 22 10/17/2017   NA 138 10/21/2017   K 3.8 10/21/2017   CL 102 10/21/2017   CREATININE 0.82 10/21/2017   BUN 20 10/21/2017   CO2 28 10/21/2017   INR 1.46 10/18/2017   HGBA1C 6.0 (H) 10/17/2017      PT/INR: No results for input(s): LABPROT, INR in the last 72 hours. Radiology: No results found.   Assessment/Plan: S/P Procedure(s) (LRB): AORTIC VALVE REPLACEMENT (N/A) TRANSESOPHAGEAL ECHOCARDIOGRAM (TEE) (N/A) STERNOTOMY  1. CV-Slightly tachycardic in low 100's. On Milrinone drip. Co ox earlier this am  62%, CVP 5 this evening. On Spironolactone 12.5 mg daily, Losartan 25 mg bid, Digoxin 0.125 mg daily. Heart failure following. 2. Pulmonary-On 4 liters of oxygen via [place]. Will need repeat CT scan (after discharge) to follow pulmonary nodules 3. Chronic systolic heart failure-per heart failure, no Lasix today. 4. ABL anemia-H and H decreased to 11.3 and 35.2 5. Thrombocytopenia-Platelet count remains 59,000. Platelets upon admission 130,000. If decreases further, can check HIT. Not on Heparin 6. History of tobacco abuse-Nicotine patch requested  Nani Skillern PA-C 10/21/2017 6:55 PM   patient examined and medical record reviewed,agree with above note. Tharon Aquas Trigt III 10/22/2017

## 2017-10-21 NOTE — Progress Notes (Signed)
Patient ID: Kenneth Mills, male   DOB: 31-Dec-1950, 66 y.o.   MRN: [id number]     Advanced Heart Failure Rounding Note  Primary Cardiologist: Dr. Haroldine Laws   Subjective:    Admitted for AVR. S/P AVR 10/18/17.  Having a good day.  Walked this morning without much dyspnea.  Got a dose of IV Lasix yesterday,  Weight down 1 lb. Co-ox 62% today with CVP 6.   Creatinine 0.82.  Per-op Echo 10/09/17 LVEF 15-20%, Severe AS  Objective:   Weight Range: 170 lb 13.7 oz (77.5 kg) Body mass index is 23.83 kg/m.   Vital Signs:   Temp:  [97.6 F (36.4 C)-98.3 F (36.8 C)] 97.6 F (36.4 C) (12/15 1544) Pulse Rate:  [94-103] 100 (12/15 1400) Resp:  [13-31] 28 (12/15 1400) BP: (104-166)/(65-122) 130/75 (12/15 1400) SpO2:  [90 %-100 %] 94 % (12/15 1400) Weight:  [170 lb 13.7 oz (77.5 kg)] 170 lb 13.7 oz (77.5 kg) (12/15 0600) Last BM Date: (PTA)  Weight change: Filed Weights   10/19/17 0600 10/20/17 0500 10/21/17 0600  Weight: 175 lb 4.3 oz (79.5 kg) 171 lb 1.2 oz (77.6 kg) 170 lb 13.7 oz (77.5 kg)    Intake/Output:   Intake/Output Summary (Last 24 hours) at 10/21/2017 1547 Last data filed at 10/21/2017 1300 Gross per 24 hour  Intake 852.8 ml  Output 1100 ml  Net -247.2 ml      Physical Exam    CVP 6 cm.  General: NAD Neck: No JVD, no thyromegaly or thyroid nodule.  Lungs: Clear to auscultation bilaterally with normal respiratory effort. CV: Nondisplaced PMI.  Heart regular S1/S2, no S3/S4, 2/6 HSM LLSB.  No peripheral edema.   Abdomen: Soft, nontender, no hepatosplenomegaly, no distention.  Skin: Intact without lesions or rashes.  Neurologic: Alert and oriented x 3.  Psych: Normal affect. Extremities: No clubbing or cyanosis.  HEENT: Normal.   Telemetry    HR around 100, difficult to tell rhythm (personally reviewed)  Labs    CBC Recent Labs    10/20/17 0440 10/21/17 0456  WBC 12.2* 10.4  HGB 12.5* 11.3*  HCT 38.2* 35.2*  MCV 95.7 96.7  PLT 59* 59*    Basic Metabolic Panel Recent Labs    10/19/17 0500 10/19/17 1700  10/20/17 0440 10/21/17 0456  NA 140  --    < > 139 138  K 4.4  --    < > 3.9 3.8  CL 110  --    < > 105 102  CO2 23  --   --  24 28  GLUCOSE 115*  --    < > 140* 133*  BUN 13  --    < > 16 20  CREATININE 0.94 0.99   < > 1.12 0.82  CALCIUM 8.2*  --   --  8.6* 8.4*  MG 2.4 2.1  --   --   --    < > = values in this interval not displayed.   Liver Function Tests No results for input(s): AST, ALT, ALKPHOS, BILITOT, PROT, ALBUMIN in the last 72 hours. No results for input(s): LIPASE, AMYLASE in the last 72 hours. Cardiac Enzymes No results for input(s): CKTOTAL, CKMB, CKMBINDEX, TROPONINI in the last 72 hours.  BNP: BNP (last 3 results) Recent Labs    09/01/17 2155 09/03/17 0139  BNP 885.5* 1,167.7*    ProBNP (last 3 results) No results for input(s): PROBNP in the last 8760 hours.   D-Dimer No results for input(s):  DDIMER in the last 72 hours. Hemoglobin A1C No results for input(s): HGBA1C in the last 72 hours. Fasting Lipid Panel No results for input(s): CHOL, HDL, LDLCALC, TRIG, CHOLHDL, LDLDIRECT in the last 72 hours. Thyroid Function Tests No results for input(s): TSH, T4TOTAL, T3FREE, THYROIDAB in the last 72 hours.  Invalid input(s): FREET3  Other results:  Imaging   No results found.  Medications:    Scheduled Medications: . acetaminophen  1,000 mg Oral Q6H  . aspirin EC  325 mg Oral Daily  . atorvastatin  20 mg Oral q1800  . bisacodyl  10 mg Oral Daily   Or  . bisacodyl  10 mg Rectal Daily  . Chlorhexidine Gluconate Cloth  6 each Topical Daily  . digoxin  0.125 mg Oral Daily  . docusate sodium  200 mg Oral Daily  . feeding supplement (ENSURE ENLIVE)  237 mL Oral BID BM  . insulin aspart  0-24 Units Subcutaneous Q4H  . losartan  25 mg Oral BID  . mouth rinse  15 mL Mouth Rinse BID  . moving right along book   Does not apply Once  . pantoprazole  40 mg Oral Daily  . sodium  chloride flush  10-40 mL Intracatheter Q12H  . sodium chloride flush  10-40 mL Intracatheter Q12H  . sodium chloride flush  3 mL Intravenous Q12H  . spironolactone  12.5 mg Oral Daily    Infusions: . sodium chloride    . lactated ringers 10 mL/hr at 10/19/17 0700  . lactated ringers 10 mL/hr at 10/20/17 0222  . milrinone 0.3 mcg/kg/min (10/21/17 1033)    PRN Medications: clonazePAM, ipratropium-albuterol, metoprolol tartrate, morphine injection, ondansetron (ZOFRAN) IV, oxyCODONE, sodium chloride flush, sodium chloride flush, sodium chloride flush, traMADol  Patient Profile   Kenneth Mills a 66 y.o.malewith h/o COPD, IBD s/p partial colectomy, Systolic CHF, and severe aortic stenosis.  Admitted for bioprosthetic AVR.   Assessment/Plan   1. S/P AVR 10/18/17 for severe AS.  2. Chronic Systolic Heart Failure: ECHO 10/2017 EF 15-20% (Pre-op).  CVP 6, co-ox 62% today.  - Decrease milrinone to 0.2, repeat co-ox in am.  - No Lasix today.  - Hold BB for now with marginal output on milrinone.  - Continue spironolactone.  - Increase losartan to 25 mg bid.  If tolerates, can transition to [employer].  - Continue digoxin 0.125 mg daily.  3. CAD: Mild non-obstructive.  - Continue 20 mg atorvastatin.  4. Pulmonary Nodules: Noted on CT 09/2017 - Repeat CT 3-6 months. No change.  5. Thrombocytopenia - Plts 84 -> 60 -> 59 -> 59 - Continue to follow daily.  6. Difficult to tell rhythm, will get ECG.   Length of Stay: 3  Loralie Champagne, MD  10/21/2017, 3:47 PM  Advanced Heart Failure Team Pager [phone] (M-F; 7a - 4p)  Please contact [facility] for night-coverage after hours (4p -7a ) and weekends on [email or web address]

## 2017-10-21 NOTE — Progress Notes (Signed)
Paged IV team a beginning of shift to touch base about order for PICC placement from dayshift. They said pt was on their list but it could not be placed until the AM because there is not an RN available to place PICC's on night shift. All drips are still infusing through introducer at this time.

## 2017-10-22 ENCOUNTER — Inpatient Hospital Stay (HOSPITAL_COMMUNITY): Payer: Medicare HMO

## 2017-10-22 DIAGNOSIS — I5022 Chronic systolic (congestive) heart failure: Secondary | ICD-10-CM

## 2017-10-22 LAB — CBC WITH DIFFERENTIAL/PLATELET
BASOS ABS: 0 10*3/uL (ref 0.0–0.1)
BASOS PCT: 0 %
Eosinophils Absolute: 0 10*3/uL (ref 0.0–0.7)
Eosinophils Relative: 1 %
HEMATOCRIT: 33.5 % — AB (ref 39.0–52.0)
HEMOGLOBIN: 11 g/dL — AB (ref 13.0–17.0)
Lymphocytes Relative: 19 %
Lymphs Abs: 1.4 10*3/uL (ref 0.7–4.0)
MCH: 31.5 pg (ref 26.0–34.0)
MCHC: 32.8 g/dL (ref 30.0–36.0)
MCV: 96 fL (ref 78.0–100.0)
Monocytes Absolute: 0.8 10*3/uL (ref 0.1–1.0)
Monocytes Relative: 11 %
NEUTROS ABS: 5.1 10*3/uL (ref 1.7–7.7)
NEUTROS PCT: 70 %
Platelets: 71 10*3/uL — ABNORMAL LOW (ref 150–400)
RBC: 3.49 MIL/uL — ABNORMAL LOW (ref 4.22–5.81)
RDW: 14.8 % (ref 11.5–15.5)
WBC: 7.3 10*3/uL (ref 4.0–10.5)

## 2017-10-22 LAB — GLUCOSE, CAPILLARY
GLUCOSE-CAPILLARY: 179 mg/dL — AB (ref 65–99)
GLUCOSE-CAPILLARY: 100 mg/dL — AB (ref 65–99)
Glucose-Capillary: 87 mg/dL (ref 65–99)
Glucose-Capillary: 92 mg/dL (ref 65–99)
Glucose-Capillary: 135 mg/dL — ABNORMAL HIGH (ref 65–99)
Glucose-Capillary: 110 mg/dL — ABNORMAL HIGH (ref 65–99)

## 2017-10-22 LAB — BASIC METABOLIC PANEL
ANION GAP: 7 (ref 5–15)
BUN: 20 mg/dL (ref 6–20)
CALCIUM: 8.3 mg/dL — AB (ref 8.9–10.3)
CO2: 27 mmol/L (ref 22–32)
Chloride: 104 mmol/L (ref 101–111)
Creatinine, Ser: 0.67 mg/dL (ref 0.61–1.24)
Glucose, Bld: 98 mg/dL (ref 65–99)
Potassium: 3.3 mmol/L — ABNORMAL LOW (ref 3.5–5.1)
Sodium: 138 mmol/L (ref 135–145)

## 2017-10-22 LAB — COOXEMETRY PANEL
CARBOXYHEMOGLOBIN: 1 % (ref 0.5–1.5)
METHEMOGLOBIN: 1.1 % (ref 0.0–1.5)
O2 Saturation: 57.3 %
Total hemoglobin: 11.4 g/dL — ABNORMAL LOW (ref 12.0–16.0)

## 2017-10-22 MED ORDER — SORBITOL 70 % SOLN
60.0000 mL | Freq: Every day | Status: DC | PRN
  Filled 2017-10-22: qty 60

## 2017-10-22 MED ORDER — POTASSIUM CHLORIDE 10 MEQ/50ML IV SOLN
10.0000 meq | INTRAVENOUS | Status: AC
  Administered 2017-10-22 (×3): 10 meq via INTRAVENOUS
  Filled 2017-10-22 (×3): qty 50

## 2017-10-22 MED ORDER — SPIRONOLACTONE 25 MG PO TABS
25.0000 mg | ORAL_TABLET | Freq: Every day | ORAL | Status: DC
  Administered 2017-10-23 – 2017-10-24 (×2): 25 mg via ORAL
  Filled 2017-10-22 (×2): qty 1

## 2017-10-22 MED ORDER — SORBITOL 70 % PO SOLN
60.0000 mL | Freq: Every day | ORAL | Status: DC | PRN
  Filled 2017-10-22: qty 60

## 2017-10-22 MED ORDER — SPIRONOLACTONE 12.5 MG HALF TABLET
12.5000 mg | ORAL_TABLET | Freq: Once | ORAL | Status: AC
  Administered 2017-10-22: 12.5 mg via ORAL
  Filled 2017-10-22: qty 1

## 2017-10-22 MED ORDER — SORBITOL 70 % SOLN: 30.0000 mL | Freq: Every day | Status: DC | PRN

## 2017-10-22 NOTE — Progress Notes (Signed)
Patient ID: Kenneth Mills, male   DOB: 06/11/1951, 66 y.o.   MRN: [id number] P    Advanced Heart Failure Rounding Note  Primary Cardiologist: Dr. Haroldine Laws   Subjective:    Admitted for AVR. S/P AVR 10/18/17.  No complaints today.  CVP 3-5 with co-ox 57%.   Creatinine 0.67.  Per-op Echo 10/09/17 LVEF 15-20%, Severe AS  Objective:   Weight Range: 165 lb 2 oz (74.9 kg) Body mass index is 23.03 kg/m.   Vital Signs:   Temp:  [97.6 F (36.4 C)-98.1 F (36.7 C)] 97.8 F (36.6 C) (12/16 0741) Pulse Rate:  [52-102] 52 (12/16 0900) Resp:  [19-28] 28 (12/16 0900) BP: (85-135)/(63-101) 96/70 (12/16 0900) SpO2:  [91 %-100 %] 96 % (12/16 0900) Weight:  [165 lb 2 oz (74.9 kg)] 165 lb 2 oz (74.9 kg) (12/16 0600) Last BM Date: (PTA)  Weight change: Filed Weights   10/20/17 0500 10/21/17 0600 10/22/17 0600  Weight: 171 lb 1.2 oz (77.6 kg) 170 lb 13.7 oz (77.5 kg) 165 lb 2 oz (74.9 kg)    Intake/Output:   Intake/Output Summary (Last 24 hours) at 10/22/2017 1026 Last data filed at 10/22/2017 0950 Gross per 24 hour  Intake 468.28 ml  Output 850 ml  Net -381.72 ml      Physical Exam    CVP 3-5.  General: NAD Neck: No JVD, no thyromegaly or thyroid nodule.  Lungs: Clear to auscultation bilaterally with normal respiratory effort. CV: Nondisplaced PMI.  Heart regular S1/S2, no S3/S4, 2/6 HSM LLSB.  No peripheral edema.   Abdomen: Soft, nontender, no hepatosplenomegaly, no distention.  Skin: Intact without lesions or rashes.  Neurologic: Alert and oriented x 3.  Psych: Normal affect. Extremities: No clubbing or cyanosis.  HEENT: Normal.     Telemetry    HR around 100, difficult to tell rhythm => probably NSR with 1st degree AV block (personally reviewed)  Labs    CBC Recent Labs    10/21/17 0456 10/22/17 0446  WBC 10.4 7.3  NEUTROABS  --  5.1  HGB 11.3* 11.0*  HCT 35.2* 33.5*  MCV 96.7 96.0  PLT 59* 71*   Basic Metabolic Panel Recent Labs     10/19/17 1700  10/21/17 0456 10/22/17 0446  NA  --    < > 138 138  K  --    < > 3.8 3.3*  CL  --    < > 102 104  CO2  --    < > 28 27  GLUCOSE  --    < > 133* 98  BUN  --    < > 20 20  CREATININE 0.99   < > 0.82 0.67  CALCIUM  --    < > 8.4* 8.3*  MG 2.1  --   --   --    < > = values in this interval not displayed.   Liver Function Tests No results for input(s): AST, ALT, ALKPHOS, BILITOT, PROT, ALBUMIN in the last 72 hours. No results for input(s): LIPASE, AMYLASE in the last 72 hours. Cardiac Enzymes No results for input(s): CKTOTAL, CKMB, CKMBINDEX, TROPONINI in the last 72 hours.  BNP: BNP (last 3 results) Recent Labs    09/01/17 2155 09/03/17 0139  BNP 885.5* 1,167.7*    ProBNP (last 3 results) No results for input(s): PROBNP in the last 8760 hours.   D-Dimer No results for input(s): DDIMER in the last 72 hours. Hemoglobin A1C No results for input(s): HGBA1C in the  last 72 hours. Fasting Lipid Panel No results for input(s): CHOL, HDL, LDLCALC, TRIG, CHOLHDL, LDLDIRECT in the last 72 hours. Thyroid Function Tests No results for input(s): TSH, T4TOTAL, T3FREE, THYROIDAB in the last 72 hours.  Invalid input(s): FREET3  Other results:  Imaging   Dg Chest Port 1 View  Result Date: 10/22/2017 CLINICAL DATA:  66 year old male postoperative day 4 aortic valve replacement. EXAM: PORTABLE CHEST 1 VIEW COMPARISON:  10/20/2017 and earlier. FINDINGS: Portable AP semi upright view at 0436 hours. Left IJ introducer sheath removed. A right PICC line has been placed, tip just below the level of the carina. A right pleural effusion appears increased since 10/19/2017. Mildly improved ventilation at the left base since yesterday. No pneumothorax or pulmonary edema. Stable cardiac size and mediastinal contours. Decreased bowel gas in the left upper quadrant. IMPRESSION: 1. Left IJ introducer sheath removed and right PICC line placed. PICC tip is at the lower SVC level. 2. Small  right pleural effusion has progressed since 10/19/2017. Mild left lung base atelectasis. Electronically Signed   By: Genevie Ann M.D.   On: 10/22/2017 07:21    Medications:    Scheduled Medications: . acetaminophen  1,000 mg Oral Q6H  . aspirin EC  325 mg Oral Daily  . atorvastatin  20 mg Oral q1800  . bisacodyl  10 mg Oral Daily   Or  . bisacodyl  10 mg Rectal Daily  . Chlorhexidine Gluconate Cloth  6 each Topical Daily  . digoxin  0.125 mg Oral Daily  . docusate sodium  200 mg Oral Daily  . feeding supplement (ENSURE ENLIVE)  237 mL Oral BID BM  . insulin aspart  0-24 Units Subcutaneous Q4H  . losartan  25 mg Oral BID  . mouth rinse  15 mL Mouth Rinse BID  . moving right along book   Does not apply Once  . nicotine  14 mg Transdermal Daily  . pantoprazole  40 mg Oral Daily  . sodium chloride flush  10-40 mL Intracatheter Q12H  . sodium chloride flush  10-40 mL Intracatheter Q12H  . sodium chloride flush  3 mL Intravenous Q12H  . spironolactone  12.5 mg Oral Daily    Infusions: . sodium chloride    . lactated ringers 10 mL/hr at 10/19/17 0700  . lactated ringers 10 mL/hr at 10/20/17 0222  . milrinone 0.2 mcg/kg/min (10/22/17 0330)  . potassium chloride 10 mEq (10/22/17 0950)    PRN Medications: clonazePAM, ipratropium-albuterol, metoprolol tartrate, morphine injection, ondansetron (ZOFRAN) IV, oxyCODONE, sodium chloride flush, sodium chloride flush, sodium chloride flush, traMADol  Patient Profile   Kenneth Mills a 66 y.o.malewith h/o COPD, IBD s/p partial colectomy, Systolic CHF, and severe aortic stenosis.  Admitted for bioprosthetic AVR.   Assessment/Plan   1. S/P AVR 10/18/17 for severe AS.  2. Chronic Systolic Heart Failure: ECHO 10/2017 EF 15-20% (Pre-op).  CVP 3-5, co-ox 57% today.  - Decrease milrinone to 0.1, repeat co-ox in am.  - No Lasix today.  - Hold BB for now with marginal output on milrinone.  - Increase spironolactone to 25 mg daily.  -  Continue losartan 25 mg bid. Can eventually transition to [facility].  - Continue digoxin 0.125 mg daily.  3. CAD: Mild non-obstructive.  - Continue 20 mg atorvastatin.  4. Pulmonary Nodules: Noted on CT 09/2017 - Repeat CT 3-6 months. No change.  5. Thrombocytopenia - Plts 84 -> 60 -> 59 -> 59 -> 71 - Continue to follow daily.  6. Difficult to tell rhythm, will get ECG.  Probably sinus with long 1st degree AVB.   Length of Stay: 4  Loralie Champagne, MD  10/22/2017, 10:26 AM  Advanced Heart Failure Team Pager [phone] (M-F; 7a - 4p)  Please contact [facility] for night-coverage after hours (4p -7a ) and weekends on [email or web address]

## 2017-10-22 NOTE — Progress Notes (Signed)
4 Days Post-Op Procedure(s) (LRB): AORTIC VALVE REPLACEMENT (N/A) TRANSESOPHAGEAL ECHOCARDIOGRAM (TEE) (N/A) STERNOTOMY Subjective: Walking Weaning milrinone  Feels stronger nsr Objective: Vital signs in last 24 hours: Temp:  [97.5 F (36.4 C)-98 F (36.7 C)] 97.5 F (36.4 C) (12/16 1131) Pulse Rate:  [52-104] 103 (12/16 1200) Cardiac Rhythm: Normal sinus rhythm;Bundle branch block (12/16 0745) Resp:  [19-29] 29 (12/16 1200) BP: (85-130)/(63-101) 117/65 (12/16 1200) SpO2:  [91 %-98 %] 98 % (12/16 1200) Weight:  [165 lb 2 oz (74.9 kg)] 165 lb 2 oz (74.9 kg) (12/16 0600)  Hemodynamic parameters for last 24 hours: CVP:  [4 mmHg-7 mmHg] 4 mmHg  Intake/Output from previous day: 12/15 0701 - 12/16 0700 In: 608.3 [P.O.:240; I.V.:368.3] Out: 550 [Urine:550] Intake/Output this shift: Total I/O In: 168 [I.V.:18; IV Piggyback:150] Out: 550 [Urine:550]       Exam    General- alert and comfortable   Lungs- clear without rales, wheezes   Cor- regular rate and rhythm, no murmur , gallop   Abdomen- soft, non-tender   Extremities - warm, non-tender, minimal edema   Neuro- oriented, appropriate, no focal weakness   Lab Results: Recent Labs    10/21/17 0456 10/22/17 0446  WBC 10.4 7.3  HGB 11.3* 11.0*  HCT 35.2* 33.5*  PLT 59* 71*   BMET:  Recent Labs    10/21/17 0456 10/22/17 0446  NA 138 138  K 3.8 3.3*  CL 102 104  CO2 28 27  GLUCOSE 133* 98  BUN 20 20  CREATININE 0.82 0.67  CALCIUM 8.4* 8.3*    PT/INR: No results for input(s): LABPROT, INR in the last 72 hours. ABG    Component Value Date/Time   PHART 7.326 (L) 10/19/2017 0233   HCO3 20.6 10/19/2017 0233   TCO2 26 10/19/2017 1706   ACIDBASEDEF 5.0 (H) 10/19/2017 0233   O2SAT 57.3 10/22/2017 0507   CBG (last 3)  Recent Labs    10/22/17 0432 10/22/17 0739 10/22/17 1130  GLUCAP 87 92 179*    Assessment/Plan: S/P Procedure(s) (LRB): AORTIC VALVE REPLACEMENT (N/A) TRANSESOPHAGEAL ECHOCARDIOGRAM  (TEE) (N/A) STERNOTOMY Cont current care   LOS: 4 days    Kenneth Mills 10/22/2017

## 2017-10-23 LAB — COOXEMETRY PANEL
Carboxyhemoglobin: 1.7 % — ABNORMAL HIGH (ref 0.5–1.5)
METHEMOGLOBIN: 0.8 % (ref 0.0–1.5)
O2 Saturation: 50.7 %
Total hemoglobin: 6.3 g/dL — CL (ref 12.0–16.0)

## 2017-10-23 LAB — BASIC METABOLIC PANEL
ANION GAP: 6 (ref 5–15)
BUN: 21 mg/dL — ABNORMAL HIGH (ref 6–20)
CHLORIDE: 108 mmol/L (ref 101–111)
CO2: 26 mmol/L (ref 22–32)
CREATININE: 0.75 mg/dL (ref 0.61–1.24)
Calcium: 8.6 mg/dL — ABNORMAL LOW (ref 8.9–10.3)
GFR calc non Af Amer: >60 mL/min (ref >60)
Glucose, Bld: 112 mg/dL — ABNORMAL HIGH (ref 65–99)
POTASSIUM: 3.5 mmol/L (ref 3.5–5.1)
Sodium: 140 mmol/L (ref 135–145)

## 2017-10-23 LAB — GLUCOSE, CAPILLARY
GLUCOSE-CAPILLARY: 104 mg/dL — AB (ref 65–99)
GLUCOSE-CAPILLARY: 171 mg/dL — AB (ref 65–99)
GLUCOSE-CAPILLARY: 127 mg/dL — AB (ref 65–99)
Glucose-Capillary: 83 mg/dL (ref 65–99)
Glucose-Capillary: 78 mg/dL (ref 65–99)

## 2017-10-23 LAB — CBC WITH DIFFERENTIAL/PLATELET
BASOS ABS: 0 10*3/uL (ref 0.0–0.1)
Basophils Relative: 0 %
EOS PCT: 2 %
Eosinophils Absolute: 0.1 10*3/uL (ref 0.0–0.7)
HCT: 25.9 % — ABNORMAL LOW (ref 39.0–52.0)
Hemoglobin: 8.6 g/dL — ABNORMAL LOW (ref 13.0–17.0)
LYMPHS PCT: 25 %
Lymphs Abs: 1.2 10*3/uL (ref 0.7–4.0)
MCH: 31.6 pg (ref 26.0–34.0)
MCHC: 33.2 g/dL (ref 30.0–36.0)
MCV: 95.2 fL (ref 78.0–100.0)
Monocytes Absolute: 0.5 10*3/uL (ref 0.1–1.0)
Monocytes Relative: 10 %
NEUTROS ABS: 3 10*3/uL (ref 1.7–7.7)
Neutrophils Relative %: 63 %
PLATELETS: 68 10*3/uL — AB (ref 150–400)
RBC: 2.72 MIL/uL — AB (ref 4.22–5.81)
RDW: 14.6 % (ref 11.5–15.5)
WBC: 4.8 10*3/uL (ref 4.0–10.5)

## 2017-10-23 MED ORDER — SODIUM CHLORIDE 0.9% FLUSH: 3.0000 mL | INTRAVENOUS | Status: DC | PRN

## 2017-10-23 MED ORDER — INSULIN ASPART 100 UNIT/ML ~~LOC~~ SOLN
0.0000 [IU] | SUBCUTANEOUS | Status: DC
  Administered 2017-10-23 – 2017-10-24 (×2): 2 [IU] via SUBCUTANEOUS

## 2017-10-23 MED ORDER — SODIUM CHLORIDE 0.9% FLUSH
3.0000 mL | Freq: Two times a day (BID) | INTRAVENOUS | Status: DC
  Administered 2017-10-23: 3 mL via INTRAVENOUS

## 2017-10-23 MED ORDER — POTASSIUM CHLORIDE 10 MEQ/50ML IV SOLN
10.0000 meq | INTRAVENOUS | Status: AC
  Administered 2017-10-23 (×3): 10 meq via INTRAVENOUS
  Filled 2017-10-23: qty 50

## 2017-10-23 MED ORDER — SODIUM CHLORIDE 0.9 % IV SOLN: 250.0000 mL | INTRAVENOUS | Status: DC | PRN

## 2017-10-23 NOTE — Progress Notes (Addendum)
[facility] DAILY ICU PROGRESS NOTE                   [address]            [place] [address]          [phone]   5 Days Post-Op Procedure(s) (LRB): AORTIC VALVE REPLACEMENT (N/A) TRANSESOPHAGEAL ECHOCARDIOGRAM (TEE) (N/A) STERNOTOMY  Total Length of Stay:  LOS: 5 days   Subjective: No issues overnight. Patient is anxious to get to the telemetry floor so he can do more for himself.   Objective: Vital signs in last 24 hours: Temp:  [97.5 F (36.4 C)-98.7 F (37.1 C)] 97.7 F (36.5 C) (12/17 0836) Pulse Rate:  [52-111] 81 (12/17 0800) Cardiac Rhythm: Normal sinus rhythm (12/17 0800) Resp:  [20-32] 25 (12/17 0800) BP: (94-119)/(65-93) 109/69 (12/17 0800) SpO2:  [88 %-98 %] 96 % (12/17 0800) Weight:  [164 lb 7.4 oz (74.6 kg)] 164 lb 7.4 oz (74.6 kg) (12/17 0407)  Filed Weights   10/21/17 0600 10/22/17 0600 10/23/17 0407  Weight: 170 lb 13.7 oz (77.5 kg) 165 lb 2 oz (74.9 kg) 164 lb 7.4 oz (74.6 kg)    Weight change: -10.6 oz (-0.3 kg)   Hemodynamic parameters for last 24 hours: CVP:  [4 mmHg-10 mmHg] 10 mmHg  Intake/Output from previous day: 12/16 0701 - 12/17 0700 In: 550.9 [P.O.:120; I.V.:280.9; IV Piggyback:150] Out: 551 [Urine:550; Stool:1]  Intake/Output this shift: Total I/O In: 36.9 [I.V.:36.9] Out: -   Current Meds: Scheduled Meds: . acetaminophen  1,000 mg Oral Q6H  . aspirin EC  325 mg Oral Daily  . atorvastatin  20 mg Oral q1800  . bisacodyl  10 mg Oral Daily   Or  . bisacodyl  10 mg Rectal Daily  . Chlorhexidine Gluconate Cloth  6 each Topical Daily  . digoxin  0.125 mg Oral Daily  . docusate sodium  200 mg Oral Daily  . feeding supplement (ENSURE ENLIVE)  237 mL Oral BID BM  . insulin aspart  0-24 Units Subcutaneous Q4H  . losartan  25 mg Oral BID  . mouth rinse  15 mL Mouth Rinse BID  . moving right along book   Does not apply Once  . nicotine  14 mg Transdermal Daily  . pantoprazole  40 mg Oral Daily  . sodium chloride  flush  10-40 mL Intracatheter Q12H  . sodium chloride flush  10-40 mL Intracatheter Q12H  . sodium chloride flush  3 mL Intravenous Q12H  . spironolactone  25 mg Oral Daily   Continuous Infusions: . sodium chloride    . lactated ringers 10 mL/hr at 10/19/17 0700  . lactated ringers 10 mL/hr at 10/23/17 0800  . milrinone 0.1 mcg/kg/min (10/23/17 0800)  . potassium chloride     PRN Meds:.clonazePAM, ipratropium-albuterol, metoprolol tartrate, morphine injection, ondansetron (ZOFRAN) IV, oxyCODONE, sodium chloride flush, sodium chloride flush, sodium chloride flush, sorbitol, traMADol  General appearance: alert, cooperative and no distress Heart: sinus tachycardia Lungs: clear to auscultation bilaterally Abdomen: soft, non-tender; bowel sounds normal; no masses,  no organomegaly Extremities: extremities normal, atraumatic, no cyanosis or edema Wound: clean and dry  Lab Results: CBC: Recent Labs    10/22/17 0446 10/23/17 0350  WBC 7.3 4.8  HGB 11.0* 8.6*  HCT 33.5* 25.9*  PLT 71* 68*   BMET:  Recent Labs    10/22/17 0446 10/23/17 0539  NA 138 140  K 3.3* 3.5  CL 104 108  CO2 27  26  GLUCOSE 98 112*  BUN 20 21*  CREATININE 0.67 0.75  CALCIUM 8.3* 8.6*    CMET: Lab Results  Component Value Date   WBC 4.8 10/23/2017   HGB 8.6 (L) 10/23/2017   HCT 25.9 (L) 10/23/2017   PLT 68 (L) 10/23/2017   GLUCOSE 112 (H) 10/23/2017   CHOL 145 09/07/2017   TRIG 90 09/07/2017   HDL 51 09/07/2017   LDLCALC 76 09/07/2017   ALT 16 (L) 10/17/2017   AST 22 10/17/2017   NA 140 10/23/2017   K 3.5 10/23/2017   CL 108 10/23/2017   CREATININE 0.75 10/23/2017   BUN 21 (H) 10/23/2017   CO2 26 10/23/2017   INR 1.46 10/18/2017   HGBA1C 6.0 (H) 10/17/2017      PT/INR: No results for input(s): LABPROT, INR in the last 72 hours. Radiology: No results found.   Assessment/Plan: S/P Procedure(s) (LRB): AORTIC VALVE REPLACEMENT (N/A) TRANSESOPHAGEAL ECHOCARDIOGRAM (TEE)  (N/A) STERNOTOMY  1. CV-Slightly tachycardic in low 100's. On Milrinone drip. Co ox earlier this am 50.7%. On Spironolactone 25 mg daily, Losartan 25 mg bid, Digoxin 0.125 mg daily. Heart failure following and assisting.  2. Pulmonary-On room air with good oxygen saturation. Will need repeat CT scan (6 months after discharge) to follow pulmonary nodules 3. Chronic systolic heart failure-per heart failure, no Lasix. Weight trending down.  4. ABL anemia-H and H decreased to 8.6 and 25.9, will monitor. 5. Thrombocytopenia-Platelet count  68,000. Platelets upon admission 130,000. Not on Heparin. Increasing slowly.  6. History of tobacco abuse-Nicotine patch ordered  Plan: If able to wean off milrinone today then will transfer to the telemetry floor. Continue incentive spirometer and ambulation.    Elgie Collard 10/23/2017 8:37 AM   I have seen and examined the patient and agree with the assessment and plan as outlined.  Transfer step down if okay w/ heart failure team  Rexene Alberts, MD 10/23/2017 9:08 AM

## 2017-10-23 NOTE — Progress Notes (Signed)
Initial Nutrition Assessment  DOCUMENTATION CODES:   Not applicable  INTERVENTION:   Ensure Enlive po BID, each supplement provides 350 kcal and 20 grams of protein  NUTRITION DIAGNOSIS:   Increased nutrient needs related to wound healing as evidenced by estimated needs.  GOAL:   Patient will meet greater than or equal to 90% of their needs  MONITOR:   PO intake, Supplement acceptance, Weight trends, Labs  REASON FOR ASSESSMENT:   Malnutrition Screening Tool    ASSESSMENT:   Pt with PMH significant heart murmur, aortic stenosis, nonischemic cardiomyopathy, COPD, and tobacco abuse. Recently underwent left/right catheretization revealing CAD. Presents this admission for aortic valve replacement 12/12.    Spoke with pt at bedside. Reports having "fair" appetite prior to admission, consuming 3-4 meals per day. Pt typically eats a bigger breakfast, a smaller lunch, and a bigger dinner. Meal completions charted as 53% average for his last 5 meals. Pt drinking Ensure provided to him and would like to continue. Appetite remains stable this admission per pt's reports. Pt admits to a 10 lb weight loss during his last hospitalization in October, but has recently gained 5 lb back. Records indicate pt has gained 9 lb since November 2018. Do not suspect any recent weight loss. Nutrition-Focused physical exam completed.   Medications reviewed and include: SSI, LR @ 20 ml/hr Labs reviewed: BUN 21 (H)   NUTRITION - FOCUSED PHYSICAL EXAM:    Most Recent Value  Orbital Region  No depletion  Upper Arm Region  No depletion  Thoracic and Lumbar Region  Unable to assess  Buccal Region  No depletion  Temple Region  No depletion  Clavicle Bone Region  Moderate depletion  Clavicle and Acromion Bone Region  Moderate depletion  Scapular Bone Region  Unable to assess  Dorsal Hand  Mild depletion  Patellar Region  Mild depletion  Anterior Thigh Region  Mild depletion  Posterior Calf Region  Mild  depletion  Edema (RD Assessment)  None  Hair  Reviewed  Eyes  Reviewed  Mouth  Reviewed  Skin  Reviewed  Nails  Reviewed       Diet Order:  Diet Heart Room service appropriate? Yes with Assist; Fluid consistency: Thin  EDUCATION NEEDS:   Not appropriate for education at this time  Skin:  Skin Assessment: Skin Integrity Issues: Skin Integrity Issues:: Incisions Incisions: chest  Last BM:  10/23/17  Height:   Ht Readings from Last 1 Encounters:  10/19/17 5' 11"  (1.803 m)    Weight:   Wt Readings from Last 1 Encounters:  10/23/17 164 lb 7.4 oz (74.6 kg)    Ideal Body Weight:  78.2 kg  BMI:  Body mass index is 22.94 kg/m.  Estimated Nutritional Needs:   Kcal:  1900-2100 kcal/day  Protein:  95-105 g/day  Fluid:  >1.9 L/day    Mariana Single RD, LDN Clinical Nutrition Pager # - [phone]

## 2017-10-23 NOTE — Progress Notes (Signed)
      [address]       [place] [address]             [phone]      Awaiting bed on step down  BP 116/80   Pulse (!) 101   Temp 98 F (36.7 C) (Oral)   Resp (!) 26   Ht 5' 11"  (1.803 m)   Wt 164 lb 7.4 oz (74.6 kg)   SpO2 100%   BMI 22.94 kg/m    Intake/Output Summary (Last 24 hours) at 10/23/2017 1858 Last data filed at 10/23/2017 1200 Gross per 24 hour  Intake 391.4 ml  Output 0 ml  Net 391.4 ml   Still on 0.1 of milrinone  Remo Lipps C. Roxan Hockey, MD [facility] [phone]

## 2017-10-23 NOTE — Plan of Care (Signed)
  Progressing Activity: Risk for activity intolerance will decrease 10/23/2017 2125 - Progressing by Netta Corrigan, RN Note Pt ambulated the entire unit, multiple times today without difficulty.  Elimination: Will not experience complications related to bowel motility 10/23/2017 2125 - Progressing by Netta Corrigan, RN Note Pt had bowel movement today Skin Integrity: Risk for impaired skin integrity will decrease 10/23/2017 2125 - Progressing by Netta Corrigan, RN Note Pt continues to have intact skin. Respiratory: Respiratory status will improve 10/23/2017 2125 - Progressing by Netta Corrigan, RN Note Pt is on room air without any complications.  Urinary Elimination: Ability to achieve and maintain adequate renal perfusion and functioning will improve 10/23/2017 2125 - Progressing by Netta Corrigan, RN Note Pt is making adequate urine output.

## 2017-10-23 NOTE — Progress Notes (Signed)
Patient ID: Kenneth Mills, male   DOB: [birth year], 66 y.o.   MRN: [id number] P    Advanced Heart Failure Rounding Note  Primary Cardiologist: Dr. Haroldine Laws   Subjective:    Admitted for AVR. S/P AVR 10/18/17.  Coox 50.7% on milrinone 0.1 mcg/kg/min.   Feeling great this am.  Denies SOB. No wheezing.  No lightheadedness walking the halls.    Creatinine 0.75.  Per-op Echo 10/09/17 LVEF 15-20%, Severe AS  Objective:   Weight Range: 164 lb 7.4 oz (74.6 kg) Body mass index is 22.94 kg/m.   Vital Signs:   Temp:  [97.5 F (36.4 C)-98.7 F (37.1 C)] 97.7 F (36.5 C) (12/17 0836) Pulse Rate:  [66-111] 109 (12/17 0900) Resp:  [20-32] 22 (12/17 0900) BP: (94-119)/(65-93) 98/73 (12/17 0900) SpO2:  [88 %-99 %] 99 % (12/17 0900) Weight:  [164 lb 7.4 oz (74.6 kg)] 164 lb 7.4 oz (74.6 kg) (12/17 0407) Last BM Date: 10/22/17  Weight change: Filed Weights   10/21/17 0600 10/22/17 0600 10/23/17 0407  Weight: 170 lb 13.7 oz (77.5 kg) 165 lb 2 oz (74.9 kg) 164 lb 7.4 oz (74.6 kg)    Intake/Output:   Intake/Output Summary (Last 24 hours) at 10/23/2017 0930 Last data filed at 10/23/2017 0900 Gross per 24 hour  Intake 550.07 ml  Output 551 ml  Net -0.93 ml      Physical Exam    CVP 4-5  General: Well appearing. No resp difficulty. HEENT: Normal Neck: Supple. JVP 5-6. Carotids 2+ bilat; no bruits. No thyromegaly or nodule noted. Cor: PMI nondisplaced. RRR, 2/6 HMS LLSB.  Lungs: CTAB, normal effort. Abdomen: Soft, non-tender, non-distended, no HSM. No bruits or masses. +BS  Extremities: No cyanosis, clubbing, or rash. R and LLE no edema.  Neuro: Alert & orientedx3, cranial nerves grossly intact. moves all 4 extremities w/o difficulty. Affect pleasant   Telemetry    NSR 90-100s, personally reviewed.    EKG   EKG 10/22/17 Sinus tach with 1st degree block.   Labs    CBC Recent Labs    10/22/17 0446 10/23/17 0350  WBC 7.3 4.8  NEUTROABS 5.1 3.0  HGB 11.0* 8.6*   HCT 33.5* 25.9*  MCV 96.0 95.2  PLT 71* 68*   Basic Metabolic Panel Recent Labs    10/22/17 0446 10/23/17 0539  NA 138 140  K 3.3* 3.5  CL 104 108  CO2 27 26  GLUCOSE 98 112*  BUN 20 21*  CREATININE 0.67 0.75  CALCIUM 8.3* 8.6*   Liver Function Tests No results for input(s): AST, ALT, ALKPHOS, BILITOT, PROT, ALBUMIN in the last 72 hours. No results for input(s): LIPASE, AMYLASE in the last 72 hours. Cardiac Enzymes No results for input(s): CKTOTAL, CKMB, CKMBINDEX, TROPONINI in the last 72 hours.  BNP: BNP (last 3 results) Recent Labs    09/01/17 2155 09/03/17 0139  BNP 885.5* 1,167.7*    ProBNP (last 3 results) No results for input(s): PROBNP in the last 8760 hours.   D-Dimer No results for input(s): DDIMER in the last 72 hours. Hemoglobin A1C No results for input(s): HGBA1C in the last 72 hours. Fasting Lipid Panel No results for input(s): CHOL, HDL, LDLCALC, TRIG, CHOLHDL, LDLDIRECT in the last 72 hours. Thyroid Function Tests No results for input(s): TSH, T4TOTAL, T3FREE, THYROIDAB in the last 72 hours.  Invalid input(s): FREET3  Other results:  Imaging   No results found.  Medications:    Scheduled Medications: . acetaminophen  1,000 mg Oral  Q6H  . aspirin EC  325 mg Oral Daily  . atorvastatin  20 mg Oral q1800  . bisacodyl  10 mg Oral Daily   Or  . bisacodyl  10 mg Rectal Daily  . Chlorhexidine Gluconate Cloth  6 each Topical Daily  . digoxin  0.125 mg Oral Daily  . docusate sodium  200 mg Oral Daily  . feeding supplement (ENSURE ENLIVE)  237 mL Oral BID BM  . insulin aspart  0-24 Units Subcutaneous Q4H  . losartan  25 mg Oral BID  . mouth rinse  15 mL Mouth Rinse BID  . moving right along book   Does not apply Once  . nicotine  14 mg Transdermal Daily  . pantoprazole  40 mg Oral Daily  . sodium chloride flush  10-40 mL Intracatheter Q12H  . sodium chloride flush  10-40 mL Intracatheter Q12H  . sodium chloride flush  3 mL Intravenous  Q12H  . spironolactone  25 mg Oral Daily    Infusions: . sodium chloride    . lactated ringers 10 mL/hr at 10/19/17 0700  . lactated ringers 10 mL/hr at 10/23/17 0900  . milrinone 0.1 mcg/kg/min (10/23/17 0900)  . potassium chloride 10 mEq (10/23/17 0901)    PRN Medications: clonazePAM, ipratropium-albuterol, metoprolol tartrate, morphine injection, ondansetron (ZOFRAN) IV, oxyCODONE, sodium chloride flush, sodium chloride flush, sodium chloride flush, sorbitol, traMADol  Patient Profile   Kenneth Mills a 66 y.o.malewith h/o COPD, IBD s/p partial colectomy, Systolic CHF, and severe aortic stenosis.  Admitted for bioprosthetic AVR.   Assessment/Plan   1. S/P AVR 10/18/17 for severe AS.  2. Chronic Systolic Heart Failure: ECHO 10/2017 EF 15-20% (Pre-op).  CVP 3-5, co-ox 57% today.  - Coox 50% on milrinone 0.1 mcg/kg/min.  - Hold BB for now with marginal output on milrinone.  - Continue spironolactone 25 mg daily.  - Continue losartan 25 mg BID. No room for Entresto today.  - Continue digoxin 0.125 mg daily.  3. CAD: Mild non-obstructive.  - Denies CP.  - Continue 20 mg atorvastatin.  4. Pulmonary Nodules: Noted on CT 09/2017 - Repeat CT 3-6 months. No change.  5. Thrombocytopenia - Plts 84 -> 60 -> 59 -> 59 -> 71 -> 68 - Continue to follow daily.  6. Rhythm - EKG 10/22/17 Sinus tach with 1st degree block.   Continue current medications for today with marginal coox. OK to move to stepdown.   Length of Stay: [place], [place]  10/23/2017, 9:30 AM  Advanced Heart Failure Team Pager [phone] (M-F; 7a - 4p)  Please contact [facility] for night-coverage after hours (4p -7a ) and weekends on [email or web address]  Patient seen and examined with the above-signed Advanced Practice Provider and/or Housestaff. I personally reviewed laboratory data, imaging studies and relevant notes. I independently examined the patient and formulated the important aspects of  the plan. I have edited the note to reflect any of my changes or salient points. I have personally discussed the plan with the patient and/or family.  Co-ox remains low on low-dose milrinone. Otherwise doing well. Volume status ok. Will continue current regimen including milrinone and follow. Ok to go to floor. D/w Dr. Roxy Manns at bedside.   Glori Bickers, MD  10:11 AM

## 2017-10-24 LAB — CBC WITH DIFFERENTIAL/PLATELET
BASOS ABS: 0 10*3/uL (ref 0.0–0.1)
BASOS PCT: 0 %
EOS ABS: 0.1 10*3/uL (ref 0.0–0.7)
EOS PCT: 1 %
HEMATOCRIT: 34.5 % — AB (ref 39.0–52.0)
Hemoglobin: 11.5 g/dL — ABNORMAL LOW (ref 13.0–17.0)
Lymphocytes Relative: 20 %
Lymphs Abs: 1.4 10*3/uL (ref 0.7–4.0)
MCH: 31.3 pg (ref 26.0–34.0)
MCHC: 33 g/dL (ref 30.0–36.0)
MCV: 94.8 fL (ref 78.0–100.0)
MONO ABS: 1 10*3/uL (ref 0.1–1.0)
MONOS PCT: 13 %
Neutro Abs: 4.7 10*3/uL (ref 1.7–7.7)
Neutrophils Relative %: 66 %
Platelets: 116 10*3/uL — ABNORMAL LOW (ref 150–400)
RBC: 3.64 MIL/uL — ABNORMAL LOW (ref 4.22–5.81)
RDW: 14.5 % (ref 11.5–15.5)
WBC: 7.2 10*3/uL (ref 4.0–10.5)

## 2017-10-24 LAB — BASIC METABOLIC PANEL
Anion gap: 9 (ref 5–15)
BUN: 20 mg/dL (ref 6–20)
CHLORIDE: 108 mmol/L (ref 101–111)
CO2: 23 mmol/L (ref 22–32)
CREATININE: 0.76 mg/dL (ref 0.61–1.24)
Calcium: 8.6 mg/dL — ABNORMAL LOW (ref 8.9–10.3)
GFR calc non Af Amer: >60 mL/min (ref >60)
Glucose, Bld: 104 mg/dL — ABNORMAL HIGH (ref 65–99)
Potassium: 3.9 mmol/L (ref 3.5–5.1)
Sodium: 140 mmol/L (ref 135–145)

## 2017-10-24 LAB — COOXEMETRY PANEL
Carboxyhemoglobin: 1 % (ref 0.5–1.5)
Carboxyhemoglobin: 1.6 % — ABNORMAL HIGH (ref 0.5–1.5)
Methemoglobin: 1.1 % (ref 0.0–1.5)
Methemoglobin: 0.7 % (ref 0.0–1.5)
O2 SAT: 49.2 %
O2 Saturation: 66.1 %
TOTAL HEMOGLOBIN: 12.1 g/dL (ref 12.0–16.0)
Total hemoglobin: 11.4 g/dL — ABNORMAL LOW (ref 12.0–16.0)

## 2017-10-24 LAB — GLUCOSE, CAPILLARY
GLUCOSE-CAPILLARY: 110 mg/dL — AB (ref 65–99)
GLUCOSE-CAPILLARY: 137 mg/dL — AB (ref 65–99)
GLUCOSE-CAPILLARY: 149 mg/dL — AB (ref 65–99)
GLUCOSE-CAPILLARY: 91 mg/dL (ref 65–99)
Glucose-Capillary: 154 mg/dL — ABNORMAL HIGH (ref 65–99)
Glucose-Capillary: 94 mg/dL (ref 65–99)

## 2017-10-24 MED ORDER — INSULIN ASPART 100 UNIT/ML ~~LOC~~ SOLN
0.0000 [IU] | Freq: Three times a day (TID) | SUBCUTANEOUS | Status: DC
  Administered 2017-10-26: 2 [IU] via SUBCUTANEOUS

## 2017-10-24 MED ORDER — SODIUM CHLORIDE 0.9 % IV BOLUS (SEPSIS)
250.0000 mL | Freq: Once | INTRAVENOUS | Status: AC
  Administered 2017-10-24: 250 mL via INTRAVENOUS

## 2017-10-24 NOTE — Progress Notes (Signed)
CARDIAC REHAB PHASE I   PRE:  Rate/Rhythm: 108 ST  BP:  Supine:   Sitting: 101/78  Standing:    SaO2: 96%RA  MODE:  Ambulation: 470 ft   POST:  Rate/Rhythm: 119 ST  BP:  Supine:   Sitting: 122/78  Standing:    SaO2: 96%RA 1110-1143 Pt walked 470 ft on RA holding to IV pole with steady gait. Did not have to stop and rest. To sitting on side of bed after walk. Coughing up productively.   Graylon Good, RN BSN  10/24/2017 11:38 AM

## 2017-10-24 NOTE — Care Management Important Message (Signed)
Important Message  Patient Details  Name: Kenneth Mills MRN: [id number] Date of Birth: [birth year]   Medicare Important Message Given:  Yes    Oluwatosin Bracy Abena 10/24/2017, 10:09 AM

## 2017-10-24 NOTE — Discharge Summary (Signed)
Physician Discharge Summary  Patient ID: Kenneth Mills MRN: [id number] DOB/AGE: 66/05/1951 66 y.o.  Admit date: 10/18/2017 Discharge date: 10/27/2017  Admission Diagnoses: 1. Severe aortic stenosis 2. Non ischemic cardiomyopathy  Patient Active Problem List   Diagnosis Date Noted  . Aortic stenosis, severe   . COPD (chronic obstructive pulmonary disease) ([facility])   . IBD (inflammatory bowel disease)   . NICM (nonischemic cardiomyopathy) ([facility])   . Tobacco abuse   . Anxiety   . LBBB (left bundle branch block)   . Acute systolic heart failure ()   . Respiratory failure ([facility]) 09/01/2017    Active Diagnoses:  1. COPD (chronic obstructive pulmonary disease) ([facility]) 2. LBBB (left bundle branch block) 3. IBD (inflammatory bowel disease)-s/p partial colectomy in 2009 4. Tobacco abuse 5. Anxiety 6. Thrombocytopenia post op but then resolved 7. Chronic systolic heart failure 8. Bilateral pulmonary nodules 9. ABL anemia   Discharged Condition: good  HPI:  Patient is a 66 year old male with long-standing history of heart murmur, recently discovered severe aortic stenosis, nonischemic cardiomyopathy, and COPD with ongoing tobacco abuse who has been referred for second surgical opinion to discuss treatment options for management of severe symptomatic aortic stenosis. Patient states that he was told he had a heart murmur more than 20 years ago. He was never formallyevaluated by a cardiologist nor had he undergone transthoracic echocardiogram until recently. He describes a long history of exertional shortness of breath and fatigue that has progressed slowly over the last few years. Approximately 6 weeks ago he developed sudden onset of severe shortness of breath for which he presented acutely on September 01, 2017 inan acute hypoxemic respiratory failure requiring intubation and mechanical ventilation. He was diagnosed with acute viral pneumonia secondary to parainfluenza virus complicated  by the presence of underlying severe aortic stenosis with severe left ventricular systolic dysfunction and COPD. During his hospitalization he underwent left and right heart catheterization on September 06, 2017 revealing mild nonobstructive coronary artery disease with relatively low filling pressures and mildly reduced cardiac output. He was evaluated by Dr. Burt Knack from the multidisciplinary heart valve team and plans were made for delayed referral for surgical evaluation after CT angiography. The patient was seen recently by Dr. Adan Sis felt that the patient might best be treated with conventional surgery presuming that his cardiac function had improved since hospital discharge. The patient was referred for second surgical opinion.  Patient is divorced and lives alone near [place]. He has 2 adult daughters, 1 of whom lives next door,accompanies him for his consultation today and is very supportive. 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 Since hospital discharge she has done well. He states that he still gets short of breath with moderate level of exertion. He denies any resting shortness of breath, PND, orthopnea, or lower extremity edema. He has never had any chest pain or chest tightness either with activity or at rest. His cough has nearly completely resolved since hospital discharge. Unfortunately he continues to smoke cigarettes, currently 1/2 pack cigarettes daily. His review of systems is also notable for problems with frequent heartburn which is made worse if he lays flat in bed at night.    Hospital Course:  On 10/18/2017 [name] underwent a aortic valve replacement with Dr. Roxy Manns.  He tolerated the procedure well and  was  transferred to the ICU.  He was extubated in a timely manner.We consulted the heart failure team for assistance with medication titration.  At this time he remained on milrinone 0.375 mcg.  Postop day 1 he was maintaining normal sinus rhythm with stable hemodynamics.  He remained on low-dose Levophed.  His coox was 60% on milrinone.  He did have some acute on chronic systolic CHF with expected postop volume excess.  He remained thrombocytopenic with a platelet count of 84,000.  We discontinued his arterial line.  We began to mobilize the patient.  Postop day 2 we were able to wean the patient off of Levophed.  He was breathing comfortably on 5 L/min with good oxygen saturation.  He continued to be fluid overloaded but had a negative fluid balance.  He remained on milrinone for inotropic support.  We discontinued his chest tubes and Foley catheter.  We inserted a PICC line and DC'd the sleeve.  Heart failure continues to follow and assist with medication titration.  Postop day 2 he was slightly tachycardic and remained on milrinone.  Spironolactone, losartan, and digoxin were initiated.  He was weaned off oxygen as tolerated.  His hemoglobin and hematocrit remained stable.  Last H and H were 11.8 and 35.4. He did have thrombocytopenia post op but this did resolve. His last platelet count was up to 201,000.   He continued to ambulate around the unit without difficulty.  He was stable to transfer to the telemetry floor on 10/23/2017.  Heart failure continued to titrate Milrinone and monitor co ox.  He was weaned off of Milrinone on 12/20. Co ox 12/21 was 51.7. Because his SVP was low, both Spironolactone and Lasix were stopped. He has been tolerating a diet and has had a bowel movement. All incisions are healing well. His pre op HGA1C was 6 and he is likely pre diabetic. He should follow up with his primary care provider regarding further surveillance of this. Diet recommendations have been included in his discharge  paperwork. He is felt surgically stable for discharge today.   Consults: cardiology  Significant Diagnostic Studies:  CLINICAL DATA:  66 year old male postoperative day 4 aortic valve replacement.  EXAM: PORTABLE CHEST 1 VIEW  COMPARISON:  10/20/2017 and earlier.  FINDINGS: Portable AP semi upright view at 0436 hours. Left IJ introducer sheath removed. A right PICC line has been placed, tip just below the level of the carina. A right pleural effusion appears increased since 10/19/2017. Mildly improved ventilation at the left base since yesterday. No pneumothorax or pulmonary edema. Stable cardiac size and mediastinal contours. Decreased bowel gas in the left upper quadrant.  IMPRESSION: 1. Left IJ introducer sheath removed and right PICC line placed. PICC tip is at the lower SVC level. 2. Small right pleural effusion has progressed since 10/19/2017. Mild left lung base atelectasis.   Electronically Signed   By: Genevie Ann M.D.   On: 10/22/2017 07:21   Treatments: CARDIOTHORACIC SURGERY OPERATIVE NOTE  Date of Procedure:                10/18/2017  Preoperative Diagnosis:        Severe Aortic Stenosis   Non-ischemic Cardiomyopathy  Postoperative Diagnosis:    Same   Procedure:        Aortic Valve Replacement             Edwards Magna Ease Pericardial Tissue Valve (size 25 mm, model # 3300TFX, serial # [id number])  Surgeon:        Valentina Gu. Roxy Manns, MD  Assistant:       Nicholes Rough, PA-C  Anesthesia:    Roberts Gaudy, MD  Operative Findings: ? Sievers type I bicuspid aortic valve with severe aortic stenosis ? Dilated left ventricle with severe global left ventricular systolic dysfunction, EF <00% ? Severe calcification of aortic valve and aortic annulus, extending into interventricular septum, intervalvular fibrosa and anterior leaflet of the mitral valve    Discharge Exam: Blood pressure 105/62, pulse 73, temperature 97.9 F (36.6  C), temperature source Oral, resp. rate 16, height 5' 11"  (1.803 m), weight 160 lb 11.2 oz (72.9 kg), SpO2 95 %.   Cardiovascular: RRR Pulmonary: Clear to auscultation bilaterally Abdomen: Soft, non tender, bowel sounds present. Extremities: No lower extremity edema. Wounds: Clean and dry.  No erythema or signs of infection.   Disposition: 06-Home-Health Care Svc  Discharge Instructions    Discharge patient   Complete by:  As directed    Discharge disposition:  01-Home or Self Care   Discharge patient date:  10/27/2017     Allergies as of 10/27/2017   No Known Allergies     Medication List    STOP taking these medications   acetaminophen 500 MG tablet Commonly known as:  TYLENOL   spironolactone 25 MG tablet Commonly known as:  ALDACTONE     TAKE these medications   aspirin 325 MG EC tablet Take 1 tablet (325 mg total) by mouth daily. Start taking on:  10/28/2017   atorvastatin 20 MG tablet Commonly known as:  LIPITOR Take 1 tablet (20 mg total) by mouth daily at 6 PM.   clonazePAM 0.5 MG tablet Commonly known as:  KLONOPIN Take 0.5 mg by mouth daily as needed for anxiety.   digoxin 0.125 MG tablet Commonly known as:  LANOXIN Take 1 tablet (0.125 mg total) by mouth daily.   furosemide 20 MG tablet Commonly known as:  LASIX Take 1 tablet (20 mg total) by mouth daily as needed. For weight gain more than 3 pounds   ivabradine 5 MG Tabs tablet Commonly known as:  CORLANOR Take 1 tablet (5 mg total) by mouth 2 (two) times daily with a meal.   losartan 25 MG tablet Commonly known as:  COZAAR Take 1 tablet (25 mg total) by mouth 2 (two) times daily.   nicotine 14 mg/24hr patch Commonly known as:  NICODERM CQ - dosed in mg/24 hours Place 1 patch (14 mg total) onto the skin daily. Start taking on:  10/28/2017   oxyCODONE 5 MG immediate release tablet Commonly known as:  Oxy IR/ROXICODONE Take 1-2 tablets (5-10 mg total) by mouth every 6 (six) hours as  needed for severe pain.      The patient has been discharged on:   1.Beta Blocker:  Yes [   ]                              No   [ x  ]                              If No, reason: Low LVEF  2.Ace Inhibitor/ARB: Yes [ x  ]  No  [    ]                                     If No, reason:  3.Statin:   Yes [  x ]                  No  [   ]                  If No, reason:  4.Ecasa:  Yes  [ x  ]                  No   [   ]                  If No, reason: Follow-up Information    Rexene Alberts, MD Follow up.   Specialty:  Cardiothoracic Surgery Why:  Your appointment is on 12/11/2017 at 11:00am. Please arrive at 10:30am for a chest xray located at [facility] which is on the first floor of our building.  Contact information: [address] [place] [address] [phone]        Starlyn Skeans, PA-C. Call today.   Specialty:  Internal Medicine Why:  for a follow up appoinment 4 weeks regarding further surveillance of HGA1C 6 (pre diabetes). Contact information: [place] [address] [phone]        [facility] Follow up on 11/16/2017.   Specialty:  Cardiology Why:  Please arrive at 1:15 pm for your appointment. Parking & entrance available on [address] beside ER (blue awning). Please take all of your medications the day of your appointment and bring a list of medication and/or all of your bottles with you. Contact information: [address] [phone] [place] [phone]            Signed: John Giovanni PA-C 10/27/2017, 2:16 PM

## 2017-10-24 NOTE — Progress Notes (Signed)
Patient assist x1 to chair this AM  Will monitor patient. Teretha Chalupa, Bettina Gavia rN

## 2017-10-24 NOTE — Progress Notes (Addendum)
      [address]       [place] [address]             [phone]      6 Days Post-Op Procedure(s) (LRB): AORTIC VALVE REPLACEMENT (N/A) TRANSESOPHAGEAL ECHOCARDIOGRAM (TEE) (N/A) STERNOTOMY Subjective: Feels good this morning. Slept okay. Plans to walk in the halls today.   Objective: Vital signs in last 24 hours: Temp:  [97.6 F (36.4 C)-98.2 F (36.8 C)] 97.6 F (36.4 C) (12/18 0413) Pulse Rate:  [81-109] 108 (12/17 2203) Cardiac Rhythm: Normal sinus rhythm;Bundle branch block;Heart block (12/18 0716) Resp:  [20-34] 20 (12/18 0413) BP: (98-138)/(64-84) 101/76 (12/18 0413) SpO2:  [91 %-100 %] 91 % (12/18 0413) Weight:  [164 lb 11.2 oz (74.7 kg)] 164 lb 11.2 oz (74.7 kg) (12/18 0413)  Hemodynamic parameters for last 24 hours: CVP:  [9 mmHg-10 mmHg] 9 mmHg  Intake/Output from previous day: 12/17 0701 - 12/18 0700 In: 966.8 [P.O.:720; I.V.:196.8; IV Piggyback:50] Out: 220 [Urine:220] Intake/Output this shift: No intake/output data recorded.  General appearance: alert, cooperative and no distress Heart: sinus tachycardia Lungs: clear to auscultation bilaterally Abdomen: soft, non-tender; bowel sounds normal; no masses,  no organomegaly Extremities: extremities normal, atraumatic, no cyanosis or edema Wound: clean and dry  Lab Results: Recent Labs    10/23/17 0350 10/24/17 0342  WBC 4.8 7.2  HGB 8.6* 11.5*  HCT 25.9* 34.5*  PLT 68* 116*   BMET:  Recent Labs    10/23/17 0539 10/24/17 0342  NA 140 140  K 3.5 3.9  CL 108 108  CO2 26 23  GLUCOSE 112* 104*  BUN 21* 20  CREATININE 0.75 0.76  CALCIUM 8.6* 8.6*    PT/INR: No results for input(s): LABPROT, INR in the last 72 hours. ABG    Component Value Date/Time   PHART 7.326 (L) 10/19/2017 0233   HCO3 20.6 10/19/2017 0233   TCO2 26 10/19/2017 1706   ACIDBASEDEF 5.0 (H) 10/19/2017 0233   O2SAT 49.2 10/24/2017 0400   CBG (last 3)  Recent Labs    10/23/17 2037 10/24/17 0022  10/24/17 0411  GLUCAP 127* 91 110*    Assessment/Plan: S/P Procedure(s) (LRB): AORTIC VALVE REPLACEMENT (N/A) TRANSESOPHAGEAL ECHOCARDIOGRAM (TEE) (N/A) STERNOTOMY  1. CV-Slightly tachycardic in low 100's. On low-dose Milrinone gtt. Co ox earlier this am 49.2%. On Spironolactone 25 mg daily, Losartan 25 mg bid, Digoxin 0.125 mg daily. EPW remain in place. Heart failure following and assisting.  2. Pulmonary-On room air with good oxygen saturation. Will need repeat CT scan (6 months after discharge) to follow pulmonary nodules 3. Chronic systolic heart failure-per heart failure, no Lasix. Weight trending down. On spironolactone 15m daily.  4. ABL anemia-H and H increased  to11.5 and 34.5, will monitor. 5. Thrombocytopenia-Platelet count  116,000.Platelets upon admission 130,000. Not on Heparin. Increasing slowly.  6. History of tobacco abuse-Nicotine patch ordered 7. Endo-A1C is 6.0, blood glucose has been reasonably controlled on SSI.   Plan: Continue incentive spirometer and ambulation. HF assisting with medical management. Incisions healing well.    LOS: 6 days    TElgie Collard12/18/2018   I have seen and examined the patient and agree with the assessment and plan as outlined.  CRexene Alberts MD 10/24/2017 10:40 AM

## 2017-10-24 NOTE — Progress Notes (Signed)
Patient ID: Kenneth Mills, male   DOB: [birth year], 66 y.o.   MRN: [id number] P    Advanced Heart Failure Rounding Note  Primary Cardiologist: Dr. Haroldine Laws   Subjective:    Admitted for AVR. S/P AVR 10/18/17.  CO-OX 49% on milrinone 0.1 mcg. CVP 1  Complaining of congestion. Denies SOB or CP. No dizziness   Per-op Echo 10/09/17 LVEF 15-20%, Severe AS  Objective:   Weight Range: 164 lb 11.2 oz (74.7 kg) Body mass index is 22.97 kg/m.   Vital Signs:   Temp:  [97.6 F (36.4 C)-98.2 F (36.8 C)] 97.6 F (36.4 C) (12/18 0413) Pulse Rate:  [101-109] 108 (12/17 2203) Resp:  [20-34] 20 (12/18 0413) BP: (98-138)/(64-84) 101/76 (12/18 0413) SpO2:  [91 %-100 %] 91 % (12/18 0413) Weight:  [164 lb 11.2 oz (74.7 kg)] 164 lb 11.2 oz (74.7 kg) (12/18 0413) Last BM Date: 10/23/17(per patient report)  Weight change: Filed Weights   10/22/17 0600 10/23/17 0407 10/24/17 0413  Weight: 165 lb 2 oz (74.9 kg) 164 lb 7.4 oz (74.6 kg) 164 lb 11.2 oz (74.7 kg)    Intake/Output:   Intake/Output Summary (Last 24 hours) at 10/24/2017 0844 Last data filed at 10/24/2017 0400 Gross per 24 hour  Intake 929.9 ml  Output 220 ml  Net 709.9 ml      Physical Exam   CVP 1 General:   No resp difficulty. Sitting in chair HEENT: normal Neck: supple. no JVD. Carotids 2+ bilat; no bruits. No lymphadenopathy or thryomegaly appreciated. Cor: PMI nondisplaced. Regular rate & rhythm. No rubs, gallops. 2/6 HMS LLSB. Sternal incision approximated.  Lungs: clear decreased in the bases on room air. No wheeze Abdomen: soft, nontender, nondistended. No hepatosplenomegaly. No bruits or masses. Good bowel sounds. Extremities: no cyanosis, clubbing, rash, edema. RUE PICC Neuro: alert & orientedx3, cranial nerves grossly intact. moves all 4 extremities w/o difficulty. Affect pleasant   Telemetry    Sinus Tach 100s personally reviewed.    EKG   EKG 10/22/17 Sinus tach with 1st degree block.   Labs      CBC Recent Labs    10/23/17 0350 10/24/17 0342  WBC 4.8 7.2  NEUTROABS 3.0 4.7  HGB 8.6* 11.5*  HCT 25.9* 34.5*  MCV 95.2 94.8  PLT 68* 599*   Basic Metabolic Panel Recent Labs    10/23/17 0539 10/24/17 0342  NA 140 140  K 3.5 3.9  CL 108 108  CO2 26 23  GLUCOSE 112* 104*  BUN 21* 20  CREATININE 0.75 0.76  CALCIUM 8.6* 8.6*   Liver Function Tests No results for input(s): AST, ALT, ALKPHOS, BILITOT, PROT, ALBUMIN in the last 72 hours. No results for input(s): LIPASE, AMYLASE in the last 72 hours. Cardiac Enzymes No results for input(s): CKTOTAL, CKMB, CKMBINDEX, TROPONINI in the last 72 hours.  BNP: BNP (last 3 results) Recent Labs    09/01/17 2155 09/03/17 0139  BNP 885.5* 1,167.7*    ProBNP (last 3 results) No results for input(s): PROBNP in the last 8760 hours.   D-Dimer No results for input(s): DDIMER in the last 72 hours. Hemoglobin A1C No results for input(s): HGBA1C in the last 72 hours. Fasting Lipid Panel No results for input(s): CHOL, HDL, LDLCALC, TRIG, CHOLHDL, LDLDIRECT in the last 72 hours. Thyroid Function Tests No results for input(s): TSH, T4TOTAL, T3FREE, THYROIDAB in the last 72 hours.  Invalid input(s): FREET3  Other results:  Imaging   No results found.  Medications:  Scheduled Medications: . aspirin EC  325 mg Oral Daily  . atorvastatin  20 mg Oral q1800  . bisacodyl  10 mg Oral Daily   Or  . bisacodyl  10 mg Rectal Daily  . Chlorhexidine Gluconate Cloth  6 each Topical Daily  . digoxin  0.125 mg Oral Daily  . docusate sodium  200 mg Oral Daily  . feeding supplement (ENSURE ENLIVE)  237 mL Oral BID BM  . insulin aspart  0-24 Units Subcutaneous Q4H  . losartan  25 mg Oral BID  . moving right along book   Does not apply Once  . nicotine  14 mg Transdermal Daily  . pantoprazole  40 mg Oral Daily  . sodium chloride flush  10-40 mL Intracatheter Q12H  . sodium chloride flush  3 mL Intravenous Q12H  . sodium chloride  flush  3 mL Intravenous Q12H  . spironolactone  25 mg Oral Daily    Infusions: . sodium chloride    . sodium chloride    . lactated ringers 10 mL/hr at 10/23/17 2100  . milrinone 0.1 mcg/kg/min (10/23/17 2100)    PRN Medications: sodium chloride, clonazePAM, ipratropium-albuterol, metoprolol tartrate, ondansetron (ZOFRAN) IV, oxyCODONE, sodium chloride flush, sodium chloride flush, sodium chloride flush, sorbitol, traMADol  Patient Profile   Kenneth Mills a 66 y.o.malewith h/o COPD, IBD s/p partial colectomy, Systolic CHF, and severe aortic stenosis.  Admitted for bioprosthetic AVR.   Assessment/Plan   1. S/P AVR 10/18/17 for severe AS.  2. Chronic Systolic Heart Failure: ECHO 10/2017 EF 15-20% (Pre-op).   -CO-OX 49%. Continue milrinone 0.1 mcg.   - Hold BB for now with marginal output on milrinone.  CVP 1. No lasix.  - Continue spironolactone 25 mg daily.  - Continue losartan 25 mg BID. Would not switch to entresto with low CVP.  - Continue digoxin 0.125 mg daily.  - Needs to use IS.  3. CAD: Mild non-obstructive.  - Continue 20 mg atorvastatin.  4. Pulmonary Nodules: Noted on CT 09/2017 - Repeat CT 3-6 months. No change.  5. Thrombocytopenia - Plts 84 -> 60 -> 59 -> 59 -> 71 -> 68-->116 Improving.  - Continue to follow daily.  6. Rhythm - EKG 10/22/17 Sinus tach with 1st degree block.    Length of Stay: [place], NP  10/24/2017, 8:44 AM  Advanced Heart Failure Team Pager [phone] (M-F; 7a - 4p)  Please contact [facility] for night-coverage after hours (4p -7a ) and weekends on [email or web address]  Patient seen and examined with Darrick Grinder, NP. We discussed all aspects of the encounter. I agree with the assessment and plan as stated above.   Co-ox remains low but other wise doing well post-op. CVP quite low. Will stop spiro. May need to give some fluid back - I will give 250cc as weight below pre-op and co-ox low. Continue milrinone for now. Continue to  ambulate.   Glori Bickers, MD  6:12 PM

## 2017-10-24 NOTE — Progress Notes (Signed)
Patient ambulated in hallway with nursing staff approximately 400 feet. x1 assist. 2 standing rest breaks. Will monitor patient. Stephanny Tsutsui, Bettina Gavia rN

## 2017-10-24 NOTE — Plan of Care (Signed)
  Education: Knowledge of General Education information will improve 10/24/2017 2232 - Progressing by Drenda Freeze, RN   Activity: Risk for activity intolerance will decrease 10/24/2017 2232 - Progressing by Drenda Freeze, RN

## 2017-10-25 LAB — CBC WITH DIFFERENTIAL/PLATELET
Basophils Absolute: 0 10*3/uL (ref 0.0–0.1)
Basophils Relative: 0 %
Eosinophils Absolute: 0.1 10*3/uL (ref 0.0–0.7)
Eosinophils Relative: 1 %
HCT: 35.6 % — ABNORMAL LOW (ref 39.0–52.0)
HEMOGLOBIN: 11.8 g/dL — AB (ref 13.0–17.0)
LYMPHS ABS: 1.5 10*3/uL (ref 0.7–4.0)
LYMPHS PCT: 18 %
MCH: 31.6 pg (ref 26.0–34.0)
MCHC: 33.1 g/dL (ref 30.0–36.0)
MCV: 95.4 fL (ref 78.0–100.0)
Monocytes Absolute: 0.9 10*3/uL (ref 0.1–1.0)
Monocytes Relative: 11 %
NEUTROS PCT: 70 %
Neutro Abs: 6 10*3/uL (ref 1.7–7.7)
Platelets: 142 10*3/uL — ABNORMAL LOW (ref 150–400)
RBC: 3.73 MIL/uL — AB (ref 4.22–5.81)
RDW: 14.6 % (ref 11.5–15.5)
WBC: 8.5 10*3/uL (ref 4.0–10.5)

## 2017-10-25 LAB — BASIC METABOLIC PANEL
Anion gap: 11 (ref 5–15)
BUN: 18 mg/dL (ref 6–20)
CHLORIDE: 106 mmol/L (ref 101–111)
CO2: 22 mmol/L (ref 22–32)
Calcium: 8.8 mg/dL — ABNORMAL LOW (ref 8.9–10.3)
Creatinine, Ser: 0.79 mg/dL (ref 0.61–1.24)
GFR calc Af Amer: >60 mL/min (ref >60)
GFR calc non Af Amer: >60 mL/min (ref >60)
GLUCOSE: 114 mg/dL — AB (ref 65–99)
POTASSIUM: 3.7 mmol/L (ref 3.5–5.1)
Sodium: 139 mmol/L (ref 135–145)

## 2017-10-25 LAB — COOXEMETRY PANEL
Carboxyhemoglobin: 1.2 % (ref 0.5–1.5)
METHEMOGLOBIN: 1.3 % (ref 0.0–1.5)
O2 SAT: 50.9 %
TOTAL HEMOGLOBIN: 11.9 g/dL — AB (ref 12.0–16.0)

## 2017-10-25 LAB — GLUCOSE, CAPILLARY
GLUCOSE-CAPILLARY: 117 mg/dL — AB (ref 65–99)
Glucose-Capillary: 105 mg/dL — ABNORMAL HIGH (ref 65–99)
Glucose-Capillary: 126 mg/dL — ABNORMAL HIGH (ref 65–99)
Glucose-Capillary: 123 mg/dL — ABNORMAL HIGH (ref 65–99)

## 2017-10-25 MED ORDER — IVABRADINE HCL 5 MG PO TABS
2.5000 mg | ORAL_TABLET | Freq: Two times a day (BID) | ORAL | Status: DC
  Administered 2017-10-25: 2.5 mg via ORAL
  Filled 2017-10-25 (×2): qty 1

## 2017-10-25 NOTE — Progress Notes (Addendum)
      [address]       ,[place] [address]             [phone]      7 Days Post-Op Procedure(s) (LRB): AORTIC VALVE REPLACEMENT (N/A) TRANSESOPHAGEAL ECHOCARDIOGRAM (TEE) (N/A) STERNOTOMY Subjective: Feels good this morning. No issues overnight.  Objective: Vital signs in last 24 hours: Temp:  [97.5 F (36.4 C)-97.9 F (36.6 C)] 97.9 F (36.6 C) (12/19 0400) Pulse Rate:  [104-112] 104 (12/18 2135) Cardiac Rhythm: Heart block;Bundle branch block (12/19 0701) Resp:  [18-20] 20 (12/19 0400) BP: (101-118)/(74-90) 118/74 (12/18 2135) SpO2:  [94 %-98 %] 94 % (12/19 0400) Weight:  [162 lb 9.6 oz (73.8 kg)] 162 lb 9.6 oz (73.8 kg) (12/19 0404)     Intake/Output from previous day: 12/18 0701 - 12/19 0700 In: 85.9 [I.V.:85.9] Out: -  Intake/Output this shift: No intake/output data recorded.  General appearance: alert, cooperative and no distress Heart: regular rate and rhythm, S1, S2 normal, no murmur, click, rub or gallop Lungs: clear to auscultation bilaterally Abdomen: soft, non-tender; bowel sounds normal; no masses,  no organomegaly Extremities: extremities normal, atraumatic, no cyanosis or edema Wound: clean and dry  Lab Results: Recent Labs    10/24/17 0342 10/25/17 0437  WBC 7.2 8.5  HGB 11.5* 11.8*  HCT 34.5* 35.6*  PLT 116* 142*   BMET:  Recent Labs    10/24/17 0342 10/25/17 0437  NA 140 139  K 3.9 3.7  CL 108 106  CO2 23 22  GLUCOSE 104* 114*  BUN 20 18  CREATININE 0.76 0.79  CALCIUM 8.6* 8.8*    PT/INR: No results for input(s): LABPROT, INR in the last 72 hours. ABG    Component Value Date/Time   PHART 7.326 (L) 10/19/2017 0233   HCO3 20.6 10/19/2017 0233   TCO2 26 10/19/2017 1706   ACIDBASEDEF 5.0 (H) 10/19/2017 0233   O2SAT 66.1 10/24/2017 1230   CBG (last 3)  Recent Labs    10/24/17 1646 10/24/17 2132 10/25/17 0635  GLUCAP 94 137* 105*    Assessment/Plan: S/P Procedure(s) (LRB): AORTIC VALVE  REPLACEMENT (N/A) TRANSESOPHAGEAL ECHOCARDIOGRAM (TEE) (N/A) STERNOTOMY  1. CV-Slightly tachycardic in low 90's-100's. On low-dose Milrinone gtt. Co ox earlier this amis 66.1%.On Losartan 25 mg bid, Digoxin 0.125 mg daily. EPW remain in place. Heart failure following and assisting. 2. Pulmonary-Onroom air with good oxygen saturation. Will need repeat CT scan (6 monthsafter discharge) to follow pulmonary nodules 3. Chronic systolic heart failure-per heart failure, no Lasix.Weight trending down now below baseline. Creatinine 0.79 4. ABL anemia-H and H increased  to11.8 and 35.6, will monitor. 5. Thrombocytopenia-Platelet count142,000.Platelets upon admission 130,000.  6. History of tobacco abuse-Nicotine patchordered 7. Endo-A1C is 6.0, blood glucose has been reasonably controlled on SSI.  Plan:  Continue incentive spirometer and ambulation. HF assisting with medical management-may trial off milrinone today. Incisions healing well.       LOS: 7 days    Elgie Collard 10/25/2017  I have seen and examined the patient and agree with the assessment and plan as outlined.  Should be able to stop milrinone today.  Anticipate possible d/c home 1-2 days.  Rexene Alberts, MD 10/25/2017 2:43 PM

## 2017-10-25 NOTE — Plan of Care (Signed)
  Education: Knowledge of General Education information will improve 10/25/2017 2150 - Progressing by Drenda Freeze, RN   Activity: Risk for activity intolerance will decrease 10/25/2017 2150 - Progressing by Drenda Freeze, RN

## 2017-10-25 NOTE — Progress Notes (Signed)
Patient ID: Kenneth Mills, male   DOB: 11-11-1950, 66 y.o.   MRN: [id number] P    Advanced Heart Failure Rounding Note  Primary Cardiologist: Dr. Haroldine Laws   Subjective:    Admitted for AVR. S/P AVR 10/18/17.  CO-OX improved to 66.1% yesterday afternoon on milrinone 0.1 mcg/kg/min. Coox pending this am.   Given fluid back and spiro held yesterday.   Feeling better today. Denies SOB or CP. No further lightheadedness or dizziness.   Per-op Echo 10/09/17 LVEF 15-20%, Severe AS  Objective:   Weight Range: 162 lb 9.6 oz (73.8 kg) Body mass index is 22.68 kg/m.   Vital Signs:   Temp:  [97.5 F (36.4 C)-97.9 F (36.6 C)] 97.9 F (36.6 C) (12/19 0400) Pulse Rate:  [104-112] 104 (12/18 2135) Resp:  [18-20] 20 (12/19 0400) BP: (101-118)/(74-90) 118/74 (12/18 2135) SpO2:  [94 %-98 %] 94 % (12/19 0400) Weight:  [162 lb 9.6 oz (73.8 kg)] 162 lb 9.6 oz (73.8 kg) (12/19 0404) Last BM Date: 10/24/17  Weight change: Filed Weights   10/23/17 0407 10/24/17 0413 10/25/17 0404  Weight: 164 lb 7.4 oz (74.6 kg) 164 lb 11.2 oz (74.7 kg) 162 lb 9.6 oz (73.8 kg)    Intake/Output:   Intake/Output Summary (Last 24 hours) at 10/25/2017 0936 Last data filed at 10/25/2017 0600 Gross per 24 hour  Intake 85.9 ml  Output -  Net 85.9 ml      Physical Exam   CVP 3-4  General: Well appearing. No resp difficulty. HEENT: Normal Neck: Supple. JVP flat. Carotids 2+ bilat; no bruits. No thyromegaly or nodule noted. Cor: PMI nondisplaced. RRR, 2/6 HMS LLSB. Sternal incision approximated.  Lungs: CTAB, normal effort. Abdomen: Soft, non-tender, non-distended, no HSM. No bruits or masses. +BS  Extremities: No cyanosis, clubbing, or rash. R and LLE no edema. RUE PICC Neuro: Alert & orientedx3, cranial nerves grossly intact. moves all 4 extremities w/o difficulty. Affect pleasant   Telemetry    Sinus tach 100-110s, personally reviewed.   EKG   EKG 10/22/17 Sinus tach with 1st degree block.     Labs    CBC Recent Labs    10/24/17 0342 10/25/17 0437  WBC 7.2 8.5  NEUTROABS 4.7 6.0  HGB 11.5* 11.8*  HCT 34.5* 35.6*  MCV 94.8 95.4  PLT 116* 761*   Basic Metabolic Panel Recent Labs    10/24/17 0342 10/25/17 0437  NA 140 139  K 3.9 3.7  CL 108 106  CO2 23 22  GLUCOSE 104* 114*  BUN 20 18  CREATININE 0.76 0.79  CALCIUM 8.6* 8.8*   Liver Function Tests No results for input(s): AST, ALT, ALKPHOS, BILITOT, PROT, ALBUMIN in the last 72 hours. No results for input(s): LIPASE, AMYLASE in the last 72 hours. Cardiac Enzymes No results for input(s): CKTOTAL, CKMB, CKMBINDEX, TROPONINI in the last 72 hours.  BNP: BNP (last 3 results) Recent Labs    09/01/17 2155 09/03/17 0139  BNP 885.5* 1,167.7*    ProBNP (last 3 results) No results for input(s): PROBNP in the last 8760 hours.   D-Dimer No results for input(s): DDIMER in the last 72 hours. Hemoglobin A1C No results for input(s): HGBA1C in the last 72 hours. Fasting Lipid Panel No results for input(s): CHOL, HDL, LDLCALC, TRIG, CHOLHDL, LDLDIRECT in the last 72 hours. Thyroid Function Tests No results for input(s): TSH, T4TOTAL, T3FREE, THYROIDAB in the last 72 hours.  Invalid input(s): FREET3  Other results:  Imaging   No results found.  Medications:    Scheduled Medications: . aspirin EC  325 mg Oral Daily  . atorvastatin  20 mg Oral q1800  . bisacodyl  10 mg Oral Daily   Or  . bisacodyl  10 mg Rectal Daily  . Chlorhexidine Gluconate Cloth  6 each Topical Daily  . digoxin  0.125 mg Oral Daily  . docusate sodium  200 mg Oral Daily  . feeding supplement (ENSURE ENLIVE)  237 mL Oral BID BM  . insulin aspart  0-24 Units Subcutaneous TID WC  . losartan  25 mg Oral BID  . moving right along book   Does not apply Once  . nicotine  14 mg Transdermal Daily  . pantoprazole  40 mg Oral Daily  . sodium chloride flush  10-40 mL Intracatheter Q12H    Infusions: . sodium chloride    . lactated  ringers 10 mL/hr at 10/24/17 1904  . milrinone 0.1 mcg/kg/min (10/25/17 0600)    PRN Medications: clonazePAM, ipratropium-albuterol, metoprolol tartrate, ondansetron (ZOFRAN) IV, oxyCODONE, sorbitol, traMADol  Patient Profile   Kenneth Burridge Pearmanis a 66 y.o.malewith h/o COPD, IBD s/p partial colectomy, Systolic CHF, and severe aortic stenosis.  Admitted for bioprosthetic AVR.   Assessment/Plan   1. S/P AVR 10/18/17 for severe AS.  2. Chronic Systolic Heart Failure: ECHO 10/2017 EF 15-20% (Pre-op).   - CO-OX pending this am. Had improved yesterday afternoon. Continue milrinone 0.1 mcg pending coox.  - Hold BB for now with marginal output on milrinone.  - CVP 3-4. Off lasix - Holding spiro with low CVP yesterday. No room to add back today.  - Continue losartan 25 mg BID. Would not switch to entresto with low CVP.  - Continue digoxin 0.125 mg daily.  - Encouraged IS.  3. CAD: Mild non-obstructive.  - Continue 20 mg atorvastatin.  - No s/s of ischemia.  4. Pulmonary Nodules: Noted on CT 09/2017 - Repeat CT 3-6 months. No change.  5. Thrombocytopenia - Plts 84 -> 60 -> 59 -> 59 -> 71 -> 68-->116 -> 142 - Continue to follow daily.  6. Rhythm - EKG 10/22/17 Sinus tach with 1st degree block. No change.   No room to increase meds today. Await coox to decide on milrinone cessation.   Length of Stay: [place], [place]  10/25/2017, 9:36 AM  Advanced Heart Failure Team Pager [phone] (M-F; 7a - 4p)  Please contact [facility] for night-coverage after hours (4p -7a ) and weekends on [email or web address]  Patient seen and examined with the above-signed Advanced Practice Provider and/or Housestaff. I personally reviewed laboratory data, imaging studies and relevant notes. I independently examined the patient and formulated the important aspects of the plan. I have edited the note to reflect any of my changes or salient points. I have personally discussed the plan with the  patient and/or family.  Feels good. Volume status a bit better with holding spiro. Co-ox back down today. Will continue milrinone one more day. I suspect rhythm is sinus tach but not 100% sure. Will start ivabradine.   Glori Bickers, MD  3:01 PM

## 2017-10-25 NOTE — Progress Notes (Signed)
CARDIAC REHAB PHASE I   PRE:  Rate/Rhythm: 109 ST  BP:  Supine:   Sitting: 126/71  Standing:    SaO2: 94%RA  MODE:  Ambulation: 470 ft   POST:  Rate/Rhythm: 116 ST  BP:  Supine:   Sitting: 110/79  Standing:    SaO2: 99%RA 1015-1045 Pt walked 470 ft on RA independently after I got wires and lines in order. He held to IV pole and walked. He stated he did not think he got to walk again yesterday after we walked. His left hip started to bother him some during walk. Back to sitting on side of bed after walk.     Graylon Good, RN BSN  10/25/2017 10:40 AM

## 2017-10-26 LAB — BASIC METABOLIC PANEL
ANION GAP: 7 (ref 5–15)
BUN: 15 mg/dL (ref 6–20)
CALCIUM: 8.8 mg/dL — AB (ref 8.9–10.3)
CO2: 23 mmol/L (ref 22–32)
Chloride: 107 mmol/L (ref 101–111)
Creatinine, Ser: 0.78 mg/dL (ref 0.61–1.24)
Glucose, Bld: 144 mg/dL — ABNORMAL HIGH (ref 65–99)
Potassium: 3.6 mmol/L (ref 3.5–5.1)
Sodium: 137 mmol/L (ref 135–145)

## 2017-10-26 LAB — CBC WITH DIFFERENTIAL/PLATELET
BASOS ABS: 0 10*3/uL (ref 0.0–0.1)
BASOS PCT: 0 %
EOS PCT: 1 %
Eosinophils Absolute: 0.1 10*3/uL (ref 0.0–0.7)
HEMATOCRIT: 34.4 % — AB (ref 39.0–52.0)
Hemoglobin: 11.3 g/dL — ABNORMAL LOW (ref 13.0–17.0)
Lymphocytes Relative: 20 %
Lymphs Abs: 1.6 10*3/uL (ref 0.7–4.0)
MCH: 31.7 pg (ref 26.0–34.0)
MCHC: 32.8 g/dL (ref 30.0–36.0)
MCV: 96.4 fL (ref 78.0–100.0)
MONO ABS: 0.5 10*3/uL (ref 0.1–1.0)
Monocytes Relative: 6 %
NEUTROS ABS: 5.9 10*3/uL (ref 1.7–7.7)
Neutrophils Relative %: 73 %
PLATELETS: 161 10*3/uL (ref 150–400)
RBC: 3.57 MIL/uL — ABNORMAL LOW (ref 4.22–5.81)
RDW: 14.9 % (ref 11.5–15.5)
WBC: 8.1 10*3/uL (ref 4.0–10.5)

## 2017-10-26 LAB — GLUCOSE, CAPILLARY
GLUCOSE-CAPILLARY: 129 mg/dL — AB (ref 65–99)
Glucose-Capillary: 118 mg/dL — ABNORMAL HIGH (ref 65–99)
Glucose-Capillary: 102 mg/dL — ABNORMAL HIGH (ref 65–99)

## 2017-10-26 LAB — COOXEMETRY PANEL
CARBOXYHEMOGLOBIN: 1.1 % (ref 0.5–1.5)
Methemoglobin: 1.2 % (ref 0.0–1.5)
O2 SAT: 47.3 %
TOTAL HEMOGLOBIN: 12.2 g/dL (ref 12.0–16.0)

## 2017-10-26 MED ORDER — POTASSIUM CHLORIDE CRYS ER 20 MEQ PO TBCR
40.0000 meq | EXTENDED_RELEASE_TABLET | Freq: Once | ORAL | Status: AC
  Administered 2017-10-26: 40 meq via ORAL
  Filled 2017-10-26: qty 2

## 2017-10-26 MED ORDER — IVABRADINE HCL 5 MG PO TABS
5.0000 mg | ORAL_TABLET | Freq: Two times a day (BID) | ORAL | Status: DC
  Administered 2017-10-26 – 2017-10-27 (×2): 5 mg via ORAL
  Filled 2017-10-26 (×4): qty 1

## 2017-10-26 NOTE — Progress Notes (Signed)
Patient ID: Kenneth Mills, male   DOB: 12-01-1950, 66 y.o.   MRN: [id number] P    Advanced Heart Failure Rounding Note  Primary Cardiologist: Dr. Haroldine Laws   Subjective:    Admitted for AVR. S/P AVR 10/18/17.  Remains on milrinone 0.1 mcg. Todays CO-OX is 47. Off lasix and spiro due to low CVP.   Overall feeling ok. Yesterday he walked 3 times. Denies SOB. No dizziness.    Per-op Echo 10/09/17 LVEF 15-20%, Severe AS  Objective:   Weight Range: 161 lb 11.2 oz (73.3 kg) Body mass index is 22.55 kg/m.   Vital Signs:   Temp:  [97.7 F (36.5 C)-97.8 F (36.6 C)] 97.7 F (36.5 C) (12/20 0424) Pulse Rate:  [103-105] 103 (12/20 0424) Resp:  [18-19] 18 (12/20 0424) BP: (100-109)/(64-73) 100/69 (12/20 0424) SpO2:  [96 %] 96 % (12/19 1500) Weight:  [161 lb 11.2 oz (73.3 kg)] 161 lb 11.2 oz (73.3 kg) (12/20 0424) Last BM Date: 10/24/17  Weight change: Filed Weights   10/24/17 0413 10/25/17 0404 10/26/17 0424  Weight: 164 lb 11.2 oz (74.7 kg) 162 lb 9.6 oz (73.8 kg) 161 lb 11.2 oz (73.3 kg)    Intake/Output:   Intake/Output Summary (Last 24 hours) at 10/26/2017 0807 Last data filed at 10/26/2017 0427 Gross per 24 hour  Intake 274.5 ml  Output 1000 ml  Net -725.5 ml      Physical Exam   CVP 7 personally checked. General:  Well appearing. No resp difficulty. Sitting in the chair.  HEENT: normal Neck: supple. JVP 5-6. Carotids 2+ bilat; no bruits. No lymphadenopathy or thryomegaly appreciated. Cor: PMI laterally displaced. Regular rate & rhythm. No rubs, gallops or murmurs. Sternal incision looks good  Lungs: clear Abdomen: soft, nontender, nondistended. No hepatosplenomegaly. No bruits or masses. Good bowel sounds. Extremities: no cyanosis, clubbing, rash, edema. RUE PICC Neuro: alert & orientedx3, cranial nerves grossly intact. moves all 4 extremities w/o difficulty. Affect pleasant    Telemetry  Sinus Tach 100s 3 beats NSVT personally reviewed.   EKG   EKG  10/22/17 Sinus tach with 1st degree block.   Labs    CBC Recent Labs    10/25/17 0437 10/26/17 0437  WBC 8.5 8.1  NEUTROABS 6.0 5.9  HGB 11.8* 11.3*  HCT 35.6* 34.4*  MCV 95.4 96.4  PLT 142* 196   Basic Metabolic Panel Recent Labs    10/25/17 0437 10/26/17 0437  NA 139 137  K 3.7 3.6  CL 106 107  CO2 22 23  GLUCOSE 114* 144*  BUN 18 15  CREATININE 0.79 0.78  CALCIUM 8.8* 8.8*   Liver Function Tests No results for input(s): AST, ALT, ALKPHOS, BILITOT, PROT, ALBUMIN in the last 72 hours. No results for input(s): LIPASE, AMYLASE in the last 72 hours. Cardiac Enzymes No results for input(s): CKTOTAL, CKMB, CKMBINDEX, TROPONINI in the last 72 hours.  BNP: BNP (last 3 results) Recent Labs    09/01/17 2155 09/03/17 0139  BNP 885.5* 1,167.7*    ProBNP (last 3 results) No results for input(s): PROBNP in the last 8760 hours.   D-Dimer No results for input(s): DDIMER in the last 72 hours. Hemoglobin A1C No results for input(s): HGBA1C in the last 72 hours. Fasting Lipid Panel No results for input(s): CHOL, HDL, LDLCALC, TRIG, CHOLHDL, LDLDIRECT in the last 72 hours. Thyroid Function Tests No results for input(s): TSH, T4TOTAL, T3FREE, THYROIDAB in the last 72 hours.  Invalid input(s): FREET3  Other results:  Imaging  No results found.  Medications:    Scheduled Medications: . aspirin EC  325 mg Oral Daily  . atorvastatin  20 mg Oral q1800  . bisacodyl  10 mg Oral Daily   Or  . bisacodyl  10 mg Rectal Daily  . Chlorhexidine Gluconate Cloth  6 each Topical Daily  . digoxin  0.125 mg Oral Daily  . docusate sodium  200 mg Oral Daily  . feeding supplement (ENSURE ENLIVE)  237 mL Oral BID BM  . insulin aspart  0-24 Units Subcutaneous TID WC  . ivabradine  2.5 mg Oral BID WC  . losartan  25 mg Oral BID  . moving right along book   Does not apply Once  . nicotine  14 mg Transdermal Daily  . pantoprazole  40 mg Oral Daily  . potassium chloride  40 mEq  Oral Once  . sodium chloride flush  10-40 mL Intracatheter Q12H    Infusions: . sodium chloride 10 mL/hr at 10/25/17 2251  . lactated ringers 10 mL/hr at 10/24/17 1904  . milrinone 0.1 mcg/kg/min (10/25/17 2248)    PRN Medications: clonazePAM, ipratropium-albuterol, metoprolol tartrate, ondansetron (ZOFRAN) IV, oxyCODONE, sorbitol, traMADol  Patient Profile   Kenneth Mills a 66 y.o.malewith h/o COPD, IBD s/p partial colectomy, Systolic CHF, and severe aortic stenosis.  Admitted for bioprosthetic AVR.   Assessment/Plan   1. S/P AVR 10/18/17 for severe AS.  2. Chronic Systolic Heart Failure: ECHO 10/2017 EF 15-20% (Pre-op).   -  Continue milrinone 0.1 mcg. CO-OX 47%. May need home milrinone.   - Hold BB for now with marginal output on milrinone.  -Off lasix and sprio.  - Continue losartan 25 mg BID. Would not switch to entresto with low CVP.  - Continue digoxin 0.125 mg daily.  - Increase ivabradine to 5 mg twice a day.  3. CAD: Mild non-obstructive.  - Continue 20 mg atorvastatin.  - No s/s of ischemia.  4. Pulmonary Nodules: Noted on CT 09/2017 - Repeat CT 3-6 months. No change.  5. Thrombocytopenia - Plts 84 -> 60 -> 59 -> 59 -> 71 -> 68-->116 -> 142->161 - Continue to follow daily.  6. Rhythm - EKG 10/22/17 Sinus tach with 1st degree block. No change.   May need home milrinone. Briefly discussed. He is ok with home milrinone if needed.   He doesn't feel back will try to get off milrinone.   I discussed with Dr Haroldine Laws and we will try to stop milrinone today and check CO-OX in am.    Length of Stay: [place], [place]  10/26/2017, 8:07 AM  Advanced Heart Failure Team Pager [phone] (M-F; 7a - 4p)  Please contact [facility] for night-coverage after hours (4p -7a ) and weekends on [email or web address]  Patient seen and examined with Darrick Grinder, NP. We discussed all aspects of the encounter. I agree with the assessment and plan as stated above.   He feels  well but remains tachycardic and co-ox remains low. Will try to avoid home mirlinone if possible. Will stop milrinone and increase ivabradine. If co-ox dropping overnight will need to place tunneled PICC for home milrinone.   Glori Bickers, MD  11:57 AM

## 2017-10-26 NOTE — Progress Notes (Addendum)
      [address]       [place] [address]             [phone]        8 Days Post-Op Procedure(s) (LRB): AORTIC VALVE REPLACEMENT (N/A) TRANSESOPHAGEAL ECHOCARDIOGRAM (TEE) (N/A) STERNOTOMY  Subjective: Patient had bowel movement this am. He is eating breakfast and has no specific complaints this am.  Objective: Vital signs in last 24 hours: Temp:  [97.7 F (36.5 C)-97.8 F (36.6 C)] 97.7 F (36.5 C) (12/20 0424) Pulse Rate:  [103-105] 103 (12/20 0424) Cardiac Rhythm: Heart block;Bundle branch block (12/19 2315) Resp:  [18-19] 18 (12/20 0424) BP: (100-109)/(64-73) 100/69 (12/20 0424) SpO2:  [96 %] 96 % (12/19 1500) Weight:  [161 lb 11.2 oz (73.3 kg)] 161 lb 11.2 oz (73.3 kg) (12/20 0424)  Pre op weight 75.7 kg Current Weight  10/26/17 161 lb 11.2 oz (73.3 kg)      Intake/Output from previous day: 12/19 0701 - 12/20 0700 In: 274.5 [P.O.:240; I.V.:34.5] Out: 1000 [Urine:1000]   Physical Exam:  Cardiovascular: Slightly tachy Pulmonary: Clear to auscultation bilaterally Abdomen: Soft, non tender, bowel sounds present. Extremities: Mild bilateral lower extremity edema. Wounds: Clean and dry.  No erythema or signs of infection.  Lab Results: CBC: Recent Labs    10/25/17 0437 10/26/17 0437  WBC 8.5 8.1  HGB 11.8* 11.3*  HCT 35.6* 34.4*  PLT 142* 161   BMET:  Recent Labs    10/25/17 0437 10/26/17 0437  NA 139 137  K 3.7 3.6  CL 106 107  CO2 22 23  GLUCOSE 114* 144*  BUN 18 15  CREATININE 0.79 0.78  CALCIUM 8.8* 8.8*    PT/INR:  Lab Results  Component Value Date   INR 1.46 10/18/2017   INR 1.04 10/18/2017   INR 1.15 09/02/2017   ABG:  INR: Will add last result for INR, ABG once components are confirmed Will add last 4 CBG results once components are confirmed  Assessment/Plan:  1. CV - Non ischemic cardiomyopathy. First degree heart block, missed beats, a few runs of bradycardia over last 24 hours. HR in the low  100's this am. On Milrinone drip. Co ox this am decreased to 47.3. Also on Digoxin 0.125 mg daily, Losartan 25 mg bid, Ivabradine 2.5 mg bid. Heart failure following.  2.  Pulmonary - On room air. Encourage incentive spirometer. 3. Chronic systolic heart failure-pre op LVEF 15-20%. Spironolactone held yesterday secondary to low CVP 4.  Acute blood loss anemia - H and H 11.3 and 34.4 5. Supplement potassium 6. Bilateral pulmonary nodules-will need repeat CT scan after discharge to follow these 7. Thrombocytopenia resolved-platelets this am up to 161,000  Donielle M ZimmermanPA-C 10/26/2017,7:53 AM  I have seen and examined the patient and agree with the assessment and plan as outlined.  Rexene Alberts, MD 10/26/2017 5:09 PM

## 2017-10-26 NOTE — Progress Notes (Signed)
CARDIAC REHAB PHASE I   PRE:  Rate/Rhythm: 99-107 ST  BP:  Supine:   Sitting: 122/73  Standing:    SaO2: 95%RA  MODE:  Ambulation: 580 ft   POST:  Rate/Rhythm: 114 ST  BP:  Supine:   Sitting: 121/86  Standing:    SaO2: 94%RA 1005-1032 Pt walked 580 ft pushing IV pole with minimal asst. Tolerated well except slightly SOB. Gave pt fake cigarette and discussed smoking cessation. Encouraged to call [email or web address] if needed. Pt back to recliner after walk. Encouraged pt to use IS and had him demonstrate.    Graylon Good, RN BSN  10/26/2017 10:28 AM

## 2017-10-27 LAB — DIGOXIN LEVEL: DIGOXIN LVL: 0.3 ng/mL — AB (ref 0.8–2.0)

## 2017-10-27 LAB — CBC
HEMATOCRIT: 35.4 % — AB (ref 39.0–52.0)
HEMOGLOBIN: 11.8 g/dL — AB (ref 13.0–17.0)
MCH: 31.7 pg (ref 26.0–34.0)
MCHC: 33.3 g/dL (ref 30.0–36.0)
MCV: 95.2 fL (ref 78.0–100.0)
Platelets: 201 10*3/uL (ref 150–400)
RBC: 3.72 MIL/uL — ABNORMAL LOW (ref 4.22–5.81)
RDW: 14.6 % (ref 11.5–15.5)
WBC: 8.2 10*3/uL (ref 4.0–10.5)

## 2017-10-27 LAB — BASIC METABOLIC PANEL
ANION GAP: 6 (ref 5–15)
BUN: 11 mg/dL (ref 6–20)
CHLORIDE: 108 mmol/L (ref 101–111)
CO2: 24 mmol/L (ref 22–32)
Calcium: 8.8 mg/dL — ABNORMAL LOW (ref 8.9–10.3)
Creatinine, Ser: 0.7 mg/dL (ref 0.61–1.24)
GFR calc Af Amer: >60 mL/min (ref >60)
GLUCOSE: 119 mg/dL — AB (ref 65–99)
POTASSIUM: 3.7 mmol/L (ref 3.5–5.1)
SODIUM: 138 mmol/L (ref 135–145)

## 2017-10-27 LAB — COOXEMETRY PANEL
Carboxyhemoglobin: 1.3 % (ref 0.5–1.5)
Methemoglobin: 0.6 % (ref 0.0–1.5)
O2 SAT: 51.7 %
TOTAL HEMOGLOBIN: 12.1 g/dL (ref 12.0–16.0)

## 2017-10-27 MED ORDER — FUROSEMIDE 20 MG PO TABS: 20.0000 mg | ORAL_TABLET | Freq: Every day | ORAL | 0 refills | Status: DC | PRN

## 2017-10-27 MED ORDER — NICOTINE 14 MG/24HR TD PT24: 14.0000 mg | MEDICATED_PATCH | Freq: Every day | TRANSDERMAL | 0 refills | Status: DC

## 2017-10-27 MED ORDER — ASPIRIN 325 MG PO TBEC: 325.0000 mg | DELAYED_RELEASE_TABLET | Freq: Every day | ORAL | Status: DC

## 2017-10-27 MED ORDER — IVABRADINE HCL 5 MG PO TABS: 5.0000 mg | ORAL_TABLET | Freq: Two times a day (BID) | ORAL | 1 refills | Status: DC

## 2017-10-27 MED ORDER — POTASSIUM CHLORIDE CRYS ER 20 MEQ PO TBCR: 40.0000 meq | EXTENDED_RELEASE_TABLET | Freq: Once | ORAL | Status: DC

## 2017-10-27 MED ORDER — OXYCODONE HCL 5 MG PO TABS: 5.0000 mg | ORAL_TABLET | Freq: Four times a day (QID) | ORAL | 0 refills | Status: DC | PRN

## 2017-10-27 MED ORDER — LOSARTAN POTASSIUM 25 MG PO TABS: 25.0000 mg | ORAL_TABLET | Freq: Two times a day (BID) | ORAL | 1 refills | Status: DC

## 2017-10-27 NOTE — Discharge Instructions (Signed)
Aortic Valve Replacement  Aortic valve replacement is surgical replacement of an aortic valve that cannot be repaired. This procedure may be done to replace:  A narrow aortic valve (aortic valve stenosis).  A deformed aortic valve (bicuspid aortic valve).  An aortic valve that does not close all the way (aortic insufficiency).  The aortic valve is replaced with artificial (prosthetic) valve. Three types of prosthetic valves are available:  Mechanical valves made entirely from man-made materials.  Donor valves from human donors. These are only used in special situations.  Biological valves made from animal tissues.  The type of prosthetic valve used will be determined based on various factors, including your age, your lifestyle, and other medical conditions you have. This procedure is done using an open surgical technique, meaning that a large incision will be made in your chest over your heart. Tell a health care provider about:  Any allergies you have.  All medicines you are taking, including vitamins, herbs, eye drops, creams, and over-the-counter medicines.  Any problems you or family members have had with anesthetic medicines.  Any blood disorders you have.  Any surgeries you have had.  Any medical conditions you have.  Whether you are pregnant or may be pregnant. What are the risks? Generally, this is a safe procedure. However, problems may occur, including:  Infection.  Bleeding.  Allergic reactions to medicines.  Damage to other structures or organs.  Blood clotting caused by the prosthetic valve.  Failure of the prosthetic valve.  What happens before the procedure?  Ask your health care provider about: ? Changing or stopping your regular medicines. This is especially important if you are taking diabetes medicines or blood thinners. ? Taking medicines such as aspirin and ibuprofen. These medicines can thin your blood. Do not take these medicines before your  procedure if your health care provider instructs you not to.  Follow instructions from your health care provider about eating or drinking restrictions.  You may be given antibiotic medicine to help prevent infection.  You may have tests, such as: ? Echocardiogram. ? Electrocardiogram (ECG). ? Blood tests. ? Urine tests.  Plan to have someone take you home when you leave the hospital.  Plan to have someone with you for at least 24 hours after you leave the hospital. What happens during the procedure?  To reduce your risk of infection: ? Your health care team will wash or sanitize their hands. ? Your skin will be washed with soap.  An IV tube will be inserted into one of your veins.  You will be given one or more of the following: ? A medicine to help you relax (sedative). ? A medicine to make you fall asleep (general anesthetic).  You will be placed on a heart-lung bypass machine. This machine provides oxygen to your blood while your heart is undergoing surgery.  An incision will be made in your chest, over your heart.  Your damaged aortic valve will be removed.  A prosthetic valve will be sewn into your heart.  Your incision will be closed with stitches (sutures), skin glue, or adhesive tape.  A bandage (dressing) will be placed over your incision. The procedure may vary among health care providers and hospitals. What happens after the procedure?  Your blood pressure, heart rate, breathing rate, and blood oxygen level will be monitored often until the medicines you were given have worn off.  Do not drive until your health care provider approves. This information is not intended to replace  advice given to you by your health care provider. Make sure you discuss any questions you have with your health care provider. Document Released: 03/15/2005 Document Revised: 03/31/2016 Document Reviewed: 09/27/2015 Elsevier Interactive Patient Education  2017 Elsevier  Inc.  Preventing Type 2 Diabetes Mellitus Type 2 diabetes (type 2 diabetes mellitus) is a long-term (chronic) disease that affects blood sugar (glucose) levels. Normally, a hormone called insulin allows glucose to enter cells in the body. The cells use glucose for energy. In type 2 diabetes, one or both of these problems may be present:  The body does not make enough insulin.  The body does not respond properly to insulin that it makes (insulin resistance).  Insulin resistance or lack of insulin causes excess glucose to build up in the blood instead of going into cells. As a result, high blood glucose (hyperglycemia) develops, which can cause many complications. Being overweight or obese and having an inactive (sedentary) lifestyle can increase your risk for diabetes. Type 2 diabetes can be delayed or prevented by making certain nutrition and lifestyle changes. What nutrition changes can be made?  Eat healthy meals and snacks regularly. Keep a healthy snack with you for when you get hungry between meals, such as fruit or a handful of nuts.  Eat lean meats and proteins that are low in saturated fats, such as chicken, fish, egg whites, and beans. Avoid processed meats.  Eat plenty of fruits and vegetables and plenty of grains that have not been processed (whole grains). It is recommended that you eat: ? 1?2 cups of fruit every day. ? 2?3 cups of vegetables every day. ? 6?8 oz of whole grains every day, such as oats, whole wheat, bulgur, brown rice, quinoa, and millet.  Eat low-fat dairy products, such as milk, yogurt, and cheese.  Eat foods that contain healthy fats, such as nuts, avocado, olive oil, and canola oil.  Drink water throughout the day. Avoid drinks that contain added sugar, such as soda or sweet tea.  Follow instructions from your health care provider about specific eating or drinking restrictions.  Control how much food you eat at a time (portion size). ? Check food labels  to find out the serving sizes of foods. ? Use a kitchen scale to weigh amounts of foods.  Saute or steam food instead of frying it. Cook with water or broth instead of oils or butter.  Limit your intake of: ? Salt (sodium). Have no more than 1 tsp (2,400 mg) of sodium a day. If you have heart disease or high blood pressure, have less than ? tsp (1,500 mg) of sodium a day. ? Saturated fat. This is fat that is solid at room temperature, such as butter or fat on meat. What lifestyle changes can be made?  Activity  Do moderate-intensity physical activity for at least 30 minutes on at least 5 days of the week, or as much as told by your health care provider.  Ask your health care provider what activities are safe for you. A mix of physical activities may be best, such as walking, swimming, cycling, and strength training.  Try to add physical activity into your day. For example: ? Park in spots that are farther away than usual, so that you walk more. For example, park in a far corner of the parking lot when you go to the office or the grocery store. ? Take a walk during your lunch break. ? Use stairs instead of elevators or escalators. Weight Loss  Lose  weight as directed. Your health care provider can determine how much weight loss is best for you and can help you lose weight safely.  If you are overweight or obese, you may be instructed to lose at least 5?7 % of your body weight. Alcohol and Tobacco   Limit alcohol intake to no more than 1 drink a day for nonpregnant women and 2 drinks a day for men. One drink equals 12 oz of beer, 5 oz of wine, or 1 oz of hard liquor.  Do not use any tobacco products, such as cigarettes, chewing tobacco, and e-cigarettes. If you need help quitting, ask your health care provider. Work With [facility] Provider  Have your blood glucose tested regularly, as told by your health care provider.  Discuss your risk factors and how you can reduce your  risk for diabetes.  Get screening tests as told by your health care provider. You may have screening tests regularly, especially if you have certain risk factors for type 2 diabetes.  Make an appointment with a diet and nutrition specialist (registered dietitian). A registered dietitian can help you make a healthy eating plan and can help you understand portion sizes and food labels. Why are these changes important?  It is possible to prevent or delay type 2 diabetes and related health problems by making lifestyle and nutrition changes.  It can be difficult to recognize signs of type 2 diabetes. The best way to avoid possible damage to your body is to take actions to prevent the disease before you develop symptoms. What can happen if changes are not made?  Your blood glucose levels may keep increasing. Having high blood glucose for a long time is dangerous. Too much glucose in your blood can damage your blood vessels, heart, kidneys, nerves, and eyes.  You may develop prediabetes or type 2 diabetes. Type 2 diabetes can lead to many chronic health problems and complications, such as: ? Heart disease. ? Stroke. ? Blindness. ? Kidney disease. ? Depression. ? Poor circulation in the feet and legs, which could lead to surgical removal (amputation) in severe cases. Where to find support:  Ask your health care provider to recommend a registered dietitian, diabetes educator, or weight loss program.  Look for local or online weight loss groups.  Join a gym, fitness club, or outdoor activity group, such as a walking club. Where to find more information: To learn more about diabetes and diabetes prevention, visit:  American Diabetes Association (ADA): www.diabetes.[employer] of Diabetes and Digestive and Kidney Diseases: FindSpin.nl  To learn more about healthy eating, visit:  The U.S. Department of Agriculture Scientist, research (physical sciences)), Choose My Plate:  http://wiley-williams.com/  Office of Disease Prevention and Health Promotion (ODPHP), Dietary Guidelines: SurferLive.at  Summary  You can reduce your risk for type 2 diabetes by increasing your physical activity, eating healthy foods, and losing weight as directed.  Talk with your health care provider about your risk for type 2 diabetes. Ask about any blood tests or screening tests that you need to have. This information is not intended to replace advice given to you by your health care provider. Make sure you discuss any questions you have with your health care provider. Document Released: 02/15/2016 Document Revised: 03/31/2016 Document Reviewed: 12/15/2015 Elsevier Interactive Patient Education  [employer].

## 2017-10-27 NOTE — Progress Notes (Signed)
Patient ID: Kenneth Mills, male   DOB: [birth year], 66 y.o.   MRN: [id number] P    Advanced Heart Failure Rounding Note  Primary Cardiologist: Dr. Haroldine Laws   Subjective:    Admitted for AVR. S/P AVR 10/18/17.  Off milrinone. CO-OX 51%. Yesterday milrinone stopped and ivabradine 5 mg twice a day.   Denies SOB. Able to walk 4 times in the hall.   Per-op Echo 10/09/17 LVEF 15-20%, Severe AS  Objective:   Weight Range: 160 lb 11.2 oz (72.9 kg) Body mass index is 22.41 kg/m.   Vital Signs:   Temp:  [97.5 F (36.4 C)-98.7 F (37.1 C)] 97.5 F (36.4 C) (12/21 0305) Pulse Rate:  [80] 80 (12/20 1319) Resp:  [20-22] 22 (12/21 0305) BP: (102)/(69) 102/69 (12/20 1319) SpO2:  [92 %-99 %] 99 % (12/21 0305) Weight:  [160 lb 11.2 oz (72.9 kg)] 160 lb 11.2 oz (72.9 kg) (12/21 0305) Last BM Date: 10/26/17  Weight change: Filed Weights   10/25/17 0404 10/26/17 0424 10/27/17 0305  Weight: 162 lb 9.6 oz (73.8 kg) 161 lb 11.2 oz (73.3 kg) 160 lb 11.2 oz (72.9 kg)    Intake/Output:  No intake or output data in the 24 hours ending 10/27/17 0843    Physical Exam  CVP 4-5  General:  Well appearing. No resp difficulty. In bed.  HEENT: normal Neck: supple. JVP 6-7. Carotids 2+ bilat; no bruits. No lymphadenopathy or thryomegaly appreciated. Cor: PMI nondisplaced. Regular rate & rhythm. No rubs, gallops or murmurs. Lungs: clear Abdomen: soft, nontender, nondistended. No hepatosplenomegaly. No bruits or masses. Good bowel sounds. Extremities: no cyanosis, clubbing, rash, edema Neuro: alert & orientedx3, cranial nerves grossly intact. moves all 4 extremities w/o difficulty. Affect pleasant   Telemetry  NSR 90s personally reviewed.   EKG   EKG 10/22/17 Sinus tach with 1st degree block.   Labs    CBC Recent Labs    10/25/17 0437 10/26/17 0437  WBC 8.5 8.1  NEUTROABS 6.0 5.9  HGB 11.8* 11.3*  HCT 35.6* 34.4*  MCV 95.4 96.4  PLT 142* 627   Basic Metabolic Panel Recent Labs     10/25/17 0437 10/26/17 0437  NA 139 137  K 3.7 3.6  CL 106 107  CO2 22 23  GLUCOSE 114* 144*  BUN 18 15  CREATININE 0.79 0.78  CALCIUM 8.8* 8.8*   Liver Function Tests No results for input(s): AST, ALT, ALKPHOS, BILITOT, PROT, ALBUMIN in the last 72 hours. No results for input(s): LIPASE, AMYLASE in the last 72 hours. Cardiac Enzymes No results for input(s): CKTOTAL, CKMB, CKMBINDEX, TROPONINI in the last 72 hours.  BNP: BNP (last 3 results) Recent Labs    09/01/17 2155 09/03/17 0139  BNP 885.5* 1,167.7*    ProBNP (last 3 results) No results for input(s): PROBNP in the last 8760 hours.   D-Dimer No results for input(s): DDIMER in the last 72 hours. Hemoglobin A1C No results for input(s): HGBA1C in the last 72 hours. Fasting Lipid Panel No results for input(s): CHOL, HDL, LDLCALC, TRIG, CHOLHDL, LDLDIRECT in the last 72 hours. Thyroid Function Tests No results for input(s): TSH, T4TOTAL, T3FREE, THYROIDAB in the last 72 hours.  Invalid input(s): FREET3  Other results:  Imaging   No results found.  Medications:    Scheduled Medications: . aspirin EC  325 mg Oral Daily  . atorvastatin  20 mg Oral q1800  . bisacodyl  10 mg Oral Daily   Or  . bisacodyl  10  mg Rectal Daily  . Chlorhexidine Gluconate Cloth  6 each Topical Daily  . digoxin  0.125 mg Oral Daily  . docusate sodium  200 mg Oral Daily  . feeding supplement (ENSURE ENLIVE)  237 mL Oral BID BM  . ivabradine  5 mg Oral BID WC  . losartan  25 mg Oral BID  . moving right along book   Does not apply Once  . nicotine  14 mg Transdermal Daily  . pantoprazole  40 mg Oral Daily  . sodium chloride flush  10-40 mL Intracatheter Q12H    Infusions: . sodium chloride 10 mL/hr at 10/25/17 2251  . lactated ringers 10 mL/hr at 10/24/17 1904    PRN Medications: clonazePAM, ipratropium-albuterol, metoprolol tartrate, ondansetron (ZOFRAN) IV, oxyCODONE, sorbitol, traMADol  Patient Profile   Kenneth Larose  Mills a 66 y.o.malewith h/o COPD, IBD s/p partial colectomy, Systolic CHF, and severe aortic stenosis.  Admitted for bioprosthetic AVR.   Assessment/Plan   1. S/P AVR 10/18/17 for severe AS.  2. Chronic Systolic Heart Failure: ECHO 10/2017 EF 15-20% (Pre-op).   -  Off milrinone. Todays CO-OX is 51%.   Keep off milrinone.  - Hold BB for now with marginal output on milrinone.  -Off lasix and sprio.  - Continue losartan 25 mg BID. Would not switch to entresto with low CVP.  - Continue digoxin 0.125 mg daily. Dig level in am.  -Continue ivabradine 5 mg twice a day.   3. CAD: Mild non-obstructive.  - Continue 20 mg atorvastatin.  - No s/s of ischemia.  4. Pulmonary Nodules: Noted on CT 09/2017 - Repeat CT 3-6 months. No change.  5. Thrombocytopenia - Improved.  - Continue to follow daily.   Continue to mobilize.    [place] for discharge. D/C Meds losartan 25 mg twice a day Digoxin 0.125 mg aily Ivabradine 5 mg twice a day.  Lasix 20 mg as needed for 3 pound weight gain in 24 hours.   Follow up in HF clinic in 2 weeks.     Length of Stay: [place], NP  10/27/2017, 8:43 AM  Advanced Heart Failure Team Pager [phone] (M-F; 7a - 4p)  Please contact [facility] for night-coverage after hours (4p -7a ) and weekends on [email or web address]  Patient seen and examined with Kenneth Grinder, NP. We discussed all aspects of the encounter. I agree with the assessment and plan as stated above.   Feels good today. Co-ox improved but still marginal. CVP ok. Walking halls. PLTs improving. Will d/c home on current regimen. F/u in HF Clinic. D/w Dr. Roxy Mills personally.  Kenneth Bickers, MD  4:42 PM

## 2017-10-27 NOTE — Care Management Important Message (Signed)
Important Message  Patient Details  Name: Kenneth Mills MRN: [id number] Date of Birth: February 21, 1951   Medicare Important Message Given:  Yes    [name] [place] 10/27/2017, 10:05 AM

## 2017-10-27 NOTE — Progress Notes (Signed)
Chest sutures removed. Pt tolerated it well.

## 2017-10-27 NOTE — Progress Notes (Addendum)
      [address]       [place] [address]             [phone]        9 Days Post-Op Procedure(s) (LRB): AORTIC VALVE REPLACEMENT (N/A) TRANSESOPHAGEAL ECHOCARDIOGRAM (TEE) (N/A) STERNOTOMY  Subjective: Patient without specific complaints this am.  Objective: Vital signs in last 24 hours: Temp:  [97.5 F (36.4 C)-98.7 F (37.1 C)] 97.5 F (36.4 C) (12/21 0305) Pulse Rate:  [80] 80 (12/20 1319) Cardiac Rhythm: Sinus tachycardia (12/20 2000) Resp:  [20-22] 22 (12/21 0305) BP: (102)/(69) 102/69 (12/20 1319) SpO2:  [92 %-99 %] 99 % (12/21 0305) Weight:  [160 lb 11.2 oz (72.9 kg)] 160 lb 11.2 oz (72.9 kg) (12/21 0305)  Pre op weight 75.7 kg Current Weight  10/27/17 160 lb 11.2 oz (72.9 kg)      Intake/Output from previous day: No intake/output data recorded.   Physical Exam:  Cardiovascular: RRR Pulmonary: Clear to auscultation bilaterally Abdomen: Soft, non tender, bowel sounds present. Extremities: No lower extremity edema. Wounds: Clean and dry.  No erythema or signs of infection.  Lab Results: CBC: Recent Labs    10/25/17 0437 10/26/17 0437  WBC 8.5 8.1  HGB 11.8* 11.3*  HCT 35.6* 34.4*  PLT 142* 161   BMET:  Recent Labs    10/25/17 0437 10/26/17 0437  NA 139 137  K 3.7 3.6  CL 106 107  CO2 22 23  GLUCOSE 114* 144*  BUN 18 15  CREATININE 0.79 0.78  CALCIUM 8.8* 8.8*    PT/INR:  Lab Results  Component Value Date   INR 1.46 10/18/2017   INR 1.04 10/18/2017   INR 1.15 09/02/2017   ABG:  INR: Will add last result for INR, ABG once components are confirmed Will add last 4 CBG results once components are confirmed  Assessment/Plan:  1. CV - Non ischemic cardiomyopathy. First degree heart block, missed beats, run of NSVT, a few runs of bradycardia over last 24 hours. HR in the low 90's this am. Off Milrinone drip. Co ox this am increased to 51.7. Also on Digoxin 0.125 mg daily, Losartan 25 mg bid, Ivabradine 5 mg  bid. Heart failure following.  2.  Pulmonary - On room air. Encourage incentive spirometer. 3. Chronic systolic heart failure-pre op LVEF 15-20%. Not on Lasix or Spironolactone as had low CVP 4.  Acute blood loss anemia - H and H 11.3 and 34.4 5. Bilateral pulmonary nodules-will need repeat CT scan after discharge to follow these 6. Thrombocytopenia resolved-platelets this am up to 161,000 7. Remove EPW 8. Once medications optimized, will consider discharge  Donielle M ZimmermanPA-C 10/27/2017,7:16 AM   I have seen and examined the patient and agree with the assessment and plan as outlined.  Looks good and remains stable off milrinone.  I think he's okay for d/c home but will defer to advanced heart failure team.  Rexene Alberts, MD 10/27/2017 1:33 PM

## 2017-10-27 NOTE — Progress Notes (Signed)
Pt discharging home with daughter.  All instructions and prescriptions given and reviewed, all questions answered.

## 2017-10-27 NOTE — Consult Note (Signed)
   [facility] CM Inpatient Consult   10/27/2017  ZADEN SAKO 06/26/1951 [id number]    Spoke with Mr. Turvey at bedside to discuss [facility] Management program.   He declined [place] Management follow up. States " I will have someone at home and they will do all of that".  Made inpatient RNCM aware Mr. Levitt declined [facility] services.    Marthenia Rolling, MSN-Ed, RN,BSN [facility] Liaison [phone]

## 2017-10-27 NOTE — Progress Notes (Signed)
Epicardial pacing wires removed. Pt tolerated it well. Will continue to monitor patient.

## 2017-10-27 NOTE — Progress Notes (Signed)
[id number] Education completed with pt who voiced understanding. Gave pt CHF booklet and discussed when to call MD. He stated he has scales at home. Discussed daily weights, 2000 mg sodium restriction and signs of CHF. Gave low sodium diets. Discussed IS and sternal precautions and ex ed. Discussed CRP 2 but pt declined. Pt would like to walk later on his own. Sleepy now. Will continue to follow. Graylon Good RN BSN 10/27/2017 10:46 AM

## 2017-10-27 NOTE — Care Management Note (Signed)
Case Management Note Marvetta Gibbons RN, BSN Unit 4E-Case Manager-- [place] coverage [phone]  Patient Details  Name: Kenneth Mills MRN: [id number] Date of Birth: Apr 04, 1951  Subjective/Objective:  Pt admitted s/p AVR on 12/12- with post op HF- remains on IV milrinone                 Action/Plan: PTA pt lived at home- independent- CM to follow for transition needs-   Expected Discharge Date:  10/27/17               Expected Discharge Plan:  Home/Self Care  In-House Referral:     Discharge planning Services  CM Consult  Post Acute Care Choice:  NA Choice offered to:  NA  DME Arranged:    DME Agency:     HH Arranged:    [place] Agency:     Status of Service:  Completed, signed off  If discussed at [employer] of Stay Meetings, dates discussed:    Discharge Disposition: home/self care   Additional Comments:  10/27/17- 1545- Marvetta Gibbons RN, CM- pt off milrinone- stable for d/c home today- no CM needs noted for transition to home- [facility] declined by pt.   Dawayne Patricia, RN 10/27/2017, 3:51 PM

## 2017-10-30 ENCOUNTER — Telehealth: Payer: Self-pay

## 2017-10-30 ENCOUNTER — Telehealth (HOSPITAL_COMMUNITY): Payer: Self-pay | Admitting: *Deleted

## 2017-10-30 NOTE — Telephone Encounter (Signed)
Patient called with issues getting his corloanor.  His insurance required prior authorization and it would cost $500.  Patient bought a weeks worth of medication.  Spoke with Suzy at heart failure clinic and they are going to get the patient samples and work on the prior authorization.  Patient is aware and will pick up sample Wednesday or Thursday.

## 2017-10-30 NOTE — Telephone Encounter (Signed)
Medication Samples have been provided to the patient.  Drug name: Corlanor      Strength: 5 mg        Qty: 1  LOT: [id number]  Exp.Date: 07/21  Dosing instructions: Take one tablet Twice Daily  The patient has been instructed regarding the correct time, dose, and frequency of taking this medication, including desired effects and most common side effects.   Darron Doom 12:19 PM 10/30/2017

## 2017-11-02 ENCOUNTER — Telehealth (HOSPITAL_COMMUNITY): Payer: Self-pay | Admitting: Pharmacist

## 2017-11-02 NOTE — Telephone Encounter (Signed)
Corlanor 5 mg BID PA approved by [employer] Part D through 10/30/19.   Ruta Hinds. Velva Harman, PharmD, BCPS, CPP Clinical Pharmacist Pager: [phone] Phone: [phone] 11/02/2017 9:59 AM

## 2017-11-06 DIAGNOSIS — Z952 Presence of prosthetic heart valve: Secondary | ICD-10-CM | POA: Diagnosis not present

## 2017-11-06 DIAGNOSIS — R7303 Prediabetes: Secondary | ICD-10-CM | POA: Diagnosis not present

## 2017-11-06 DIAGNOSIS — I5021 Acute systolic (congestive) heart failure: Secondary | ICD-10-CM | POA: Diagnosis not present

## 2017-11-06 DIAGNOSIS — Z87891 Personal history of nicotine dependence: Secondary | ICD-10-CM | POA: Diagnosis not present

## 2017-11-10 ENCOUNTER — Telehealth (HOSPITAL_COMMUNITY): Payer: Self-pay | Admitting: Pharmacist

## 2017-11-10 MED ORDER — IVABRADINE HCL 5 MG PO TABS: 5.0000 mg | ORAL_TABLET | Freq: Two times a day (BID) | ORAL | 11 refills | Status: DC

## 2017-11-10 NOTE — Telephone Encounter (Signed)
Enrolled in PANF so that [name] will have $1000 to use toward his Corlanor copay costs through 11/09/18. Info relayed to [place] in [place].   Member ID: [id number] Group ID: [id number] RxBin ID: [id number] PCN: [id number] Eligibility Start Date: 08/12/2017 Eligibility End Date: 11/09/2018 Assistance Amount: $1,000.00   Doroteo Bradford K. Velva Harman, PharmD, BCPS, CPP Clinical Pharmacist Pager: [phone] Phone: [phone] 11/10/2017 12:57 PM

## 2017-11-14 ENCOUNTER — Encounter (HOSPITAL_COMMUNITY): Payer: Medicare HMO

## 2017-11-16 ENCOUNTER — Ambulatory Visit (HOSPITAL_COMMUNITY)
Admission: RE | Admit: 2017-11-16 | Discharge: 2017-11-16 | Disposition: A | Discharge status: Home / Self Care | Payer: Medicare HMO | Source: Ambulatory Visit | Attending: Internal Medicine | Admitting: Internal Medicine

## 2017-11-16 ENCOUNTER — Encounter (HOSPITAL_COMMUNITY): Payer: Self-pay

## 2017-11-16 VITALS — BP 120/72 | HR 88 | Wt 163.0 lb

## 2017-11-16 DIAGNOSIS — J440 Chronic obstructive pulmonary disease with acute lower respiratory infection: Secondary | ICD-10-CM

## 2017-11-16 DIAGNOSIS — I5022 Chronic systolic (congestive) heart failure: Secondary | ICD-10-CM

## 2017-11-16 DIAGNOSIS — Z87891 Personal history of nicotine dependence: Secondary | ICD-10-CM | POA: Insufficient documentation

## 2017-11-16 DIAGNOSIS — I429 Cardiomyopathy, unspecified: Secondary | ICD-10-CM | POA: Diagnosis not present

## 2017-11-16 DIAGNOSIS — I447 Left bundle-branch block, unspecified: Secondary | ICD-10-CM | POA: Diagnosis not present

## 2017-11-16 DIAGNOSIS — Z7982 Long term (current) use of aspirin: Secondary | ICD-10-CM | POA: Insufficient documentation

## 2017-11-16 DIAGNOSIS — I350 Nonrheumatic aortic (valve) stenosis: Secondary | ICD-10-CM

## 2017-11-16 DIAGNOSIS — Z9049 Acquired absence of other specified parts of digestive tract: Secondary | ICD-10-CM | POA: Diagnosis not present

## 2017-11-16 DIAGNOSIS — Z801 Family history of malignant neoplasm of trachea, bronchus and lung: Secondary | ICD-10-CM | POA: Diagnosis not present

## 2017-11-16 DIAGNOSIS — K589 Irritable bowel syndrome without diarrhea: Secondary | ICD-10-CM | POA: Diagnosis not present

## 2017-11-16 DIAGNOSIS — Z79899 Other long term (current) drug therapy: Secondary | ICD-10-CM | POA: Insufficient documentation

## 2017-11-16 DIAGNOSIS — K219 Gastro-esophageal reflux disease without esophagitis: Secondary | ICD-10-CM | POA: Diagnosis not present

## 2017-11-16 DIAGNOSIS — F419 Anxiety disorder, unspecified: Secondary | ICD-10-CM | POA: Diagnosis not present

## 2017-11-16 DIAGNOSIS — Z9889 Other specified postprocedural states: Secondary | ICD-10-CM | POA: Insufficient documentation

## 2017-11-16 DIAGNOSIS — I2510 Atherosclerotic heart disease of native coronary artery without angina pectoris: Secondary | ICD-10-CM | POA: Insufficient documentation

## 2017-11-16 DIAGNOSIS — Z720 Tobacco use: Secondary | ICD-10-CM

## 2017-11-16 DIAGNOSIS — Z85828 Personal history of other malignant neoplasm of skin: Secondary | ICD-10-CM | POA: Insufficient documentation

## 2017-11-16 DIAGNOSIS — Z8249 Family history of ischemic heart disease and other diseases of the circulatory system: Secondary | ICD-10-CM | POA: Diagnosis not present

## 2017-11-16 DIAGNOSIS — Z953 Presence of xenogenic heart valve: Secondary | ICD-10-CM

## 2017-11-16 DIAGNOSIS — R011 Cardiac murmur, unspecified: Secondary | ICD-10-CM | POA: Insufficient documentation

## 2017-11-16 DIAGNOSIS — J449 Chronic obstructive pulmonary disease, unspecified: Secondary | ICD-10-CM | POA: Insufficient documentation

## 2017-11-16 LAB — BASIC METABOLIC PANEL
ANION GAP: 9 (ref 5–15)
BUN: 16 mg/dL (ref 6–20)
CALCIUM: 9.4 mg/dL (ref 8.9–10.3)
CO2: 27 mmol/L (ref 22–32)
Chloride: 105 mmol/L (ref 101–111)
Creatinine, Ser: 0.96 mg/dL (ref 0.61–1.24)
GFR calc Af Amer: >60 mL/min (ref >60)
GFR calc non Af Amer: >60 mL/min (ref >60)
GLUCOSE: 109 mg/dL — AB (ref 65–99)
Potassium: 4.1 mmol/L (ref 3.5–5.1)
Sodium: 141 mmol/L (ref 135–145)

## 2017-11-16 MED ORDER — SACUBITRIL-VALSARTAN 24-26 MG PO TABS: 1.0000 | ORAL_TABLET | Freq: Two times a day (BID) | ORAL | 6 refills | Status: DC

## 2017-11-16 NOTE — Patient Instructions (Signed)
STOP Losartan.  START Entresto 24/26 mg tablet twice daily.  Routine lab work today. Will notify you of abnormal results, otherwise no news is good news!  Return in 7-10 days for repeat lab.  Follow up 4 weeks with CHF clinical pharmacist Ileene Patrick.  Follow up 2 months with Dr. Haroldine Laws.  Take all medication as prescribed the day of your appointment. Bring all medications with you to your appointment.  Do the following things EVERYDAY: 1) Weigh yourself in the morning before breakfast. Write it down and keep it in a log. 2) Take your medicines as prescribed 3) Eat low salt foods-Limit salt (sodium) to 2000 mg per day.  4) Stay as active as you can everyday 5) Limit all fluids for the day to less than 2 liters

## 2017-11-16 NOTE — Progress Notes (Signed)
Advanced Heart Failure Clinic Note   Primary Cardiologist: Dr. Haroldine Laws   HPI:  Kenneth Mills is a 67 y.o. male with h/o COPD, IBD s/p partial colectomy, Systolic CHF, and severe aortic stenosis.  Admitted 09/01/17 with acute respiratory failure with parainfluenza and + PCT requiring intubation. Echo showed markedly depressed LV dysfunction and critical low-grade AS (new findings).   Cath showed non-obstructive disease, with marginal cardiac output, and severe AS.    TAVR team consulted and work up began. Pt had cardiac CTs and Carotid dopplers inpatient. PFTs and outpatient surgery follow up arranged.   Pt s/p AVR 10/18/2017. Required milrinone transiently that admission. Meds titrated as tolerated. CVP low on discharge so not started on Entresto.   He presents today for post op visit. Feeling great overall.  Denies SOB with ADLs or walking on flat ground. Having mild chest soreness still from his surgery. Denies exertional chest pain. Weight stable at home. He thinks he is taking lasix as needed. Daughter confirms. He states he has stopped smoking.   Review of systems complete and found to be negative unless listed in HPI.    PFTs 09/14/17 FVC-pre 4.13     (91%) FVC-post 3.85   (84%) FEV1-pre 2.53   (75%) FEV1-post 2.46 (72%) TLC 6.19 (88%) DLCO 17.26 (53%) (uncorrected)  Echo 09/02/17: EF 15% diffuse HK Normal RV. Critical low-gradient AS  RHC/LHC 09/05/2017  Prox RCA lesion, 50 %stenosed.  Mid RCA lesion, 30 %stenosed.  Mid LAD lesion, 30 %stenosed.  Mid Cx to Dist Cx lesion, 20 %stenosed.  Findings:  Ao = 96/67 (81) LV 140/19 RA = 4 RV = 31/7 PA = 28/13 (21) PCW = 14 Fick cardiac output/index =3.7/1.9 PVR = 1.9 WU Ao sat = 97% PA sat = 69%, 65%  Aortic valve Peak gradient 44mHG Mean gradient 24 mmHG  Past Medical History:  Diagnosis Date  . Anxiety   . Aortic stenosis, severe    a. suspect low flow, low gradient severe AS.   .Marland KitchenCancer (HCC)    squamous cell - right ear  . COPD (chronic obstructive pulmonary disease) ([facility]   . GERD (gastroesophageal reflux disease)   . Headache   . Heart murmur   . IBD (inflammatory bowel disease)    a. s/p partial colectomy in 2009  . LBBB (left bundle branch block)   . NICM (nonischemic cardiomyopathy) ([facility]    a. 08/2014: EF 15% suspect to be endstage CM 2/2 severe AS  . Pneumonia 2018  . S/P aortic valve replacement with bioprosthetic valve 10/18/2017   [facility] bovine pericardial tissue valve  . Tobacco abuse    Current Outpatient Medications  Medication Sig Dispense Refill  . aspirin EC 325 MG EC tablet Take 1 tablet (325 mg total) by mouth daily.    . digoxin (LANOXIN) 0.125 MG tablet Take 1 tablet (0.125 mg total) by mouth daily. 30 tablet 2  . furosemide (LASIX) 20 MG tablet Take 1 tablet (20 mg total) by mouth daily as needed. For weight gain more than 3 pounds 30 tablet 0  . ivabradine (CORLANOR) 5 MG TABS tablet Take 1 tablet (5 mg total) by mouth 2 (two) times daily with a meal. 60 tablet 11  . losartan (COZAAR) 25 MG tablet Take 1 tablet (25 mg total) by mouth 2 (two) times daily. 60 tablet 1  . atorvastatin (LIPITOR) 20 MG tablet Take 1 tablet (20 mg total) by mouth daily at 6 PM. (Patient  not taking: Reported on 11/16/2017) 30 tablet 2  . clonazePAM (KLONOPIN) 0.5 MG tablet Take 0.5 mg by mouth daily as needed for anxiety.     . nicotine (NICODERM CQ - DOSED IN MG/24 HOURS) 14 mg/24hr patch Place 1 patch (14 mg total) onto the skin daily. 28 patch 0  . oxyCODONE (OXY IR/ROXICODONE) 5 MG immediate release tablet Take 1-2 tablets (5-10 mg total) by mouth every 6 (six) hours as needed for severe pain. (Patient not taking: Reported on 11/16/2017) 30 tablet 0   No current facility-administered medications for this encounter.    No Known Allergies  Social History   Socioeconomic History  . Marital status: Married    Spouse name: Not on file  . Number of  children: Not on file  . Years of education: Not on file  . Highest education level: Not on file  Social Needs  . Financial resource strain: Not on file  . Food insecurity - worry: Not on file  . Food insecurity - inability: Not on file  . Transportation needs - medical: Not on file  . Transportation needs - non-medical: Not on file  Occupational History  . Not on file  Tobacco Use  . Smoking status: Former Smoker    Packs/day: 2.00    Types: Cigarettes    Start date: 1966    Last attempt to quit: 10/18/2017    Years since quitting: 0.0  . Smokeless tobacco: Former Systems developer  . Tobacco comment: as of 10/17/17 has not smoked for 26 hours  Substance and Sexual Activity  . Alcohol use: Yes    Comment: rarely  . Drug use: No  . Sexual activity: Not on file  Other Topics Concern  . Not on file  Social History Narrative  . Not on file   Family History  Problem Relation Age of Onset  . Hypertension Father   . Heart attack Father   . Lung cancer Brother    Vitals:   11/16/17 1326  BP: 120/72  Pulse: 88  SpO2: 97%  Weight: 163 lb (73.9 kg)   Wt Readings from Last 3 Encounters:  11/16/17 163 lb (73.9 kg)  10/27/17 160 lb 11.2 oz (72.9 kg)  10/17/17 166 lb 12.8 oz (75.7 kg)    PHYSICAL EXAM: General: Well appearing. No resp difficulty. HEENT: Normal Neck: Supple. JVP 5-6. Carotids 2+ bilat; no bruits. No thyromegaly or nodule noted. Cor: PMI nondisplaced. RRR, No M/G/R noted Lungs: CTAB, normal effort. Abdomen: Soft, non-tender, non-distended, no HSM. No bruits or masses. +BS  Extremities: No cyanosis, clubbing, or rash. R and LLE no edema.  Neuro: Alert & orientedx3, cranial nerves grossly intact. moves all 4 extremities w/o difficulty. Affect pleasant   ASSESSMENT & PLAN:   1. Chronic systolic CHF - Echo 16/10/96 LVEF 15%, RV normal, critical low gradient AS - NYHA II-III - Volume status stable on exam - He thinks he is taking lasix as needed only.   - Continue  digoxin 0.125 mg daily.  - Stop losartan. Switch to Entresto 24/26 mg BID. BMET today and 7-10 days. - Continue spironolactone 12.5 mg daily. BMET today.  - Reinforced fluid restriction to < 2 L daily, sodium restriction to less than 2000 mg daily, and the importance of daily weights.    2. COPD - PFTS 09/14/18 better than expected with tobacco use.   - DLCO reduced.  No change.   3. Low-gradient critical aortic stenosis s/p AVR 10/18/2017 - Doing well. Sees Dr.  Roxy Manns 12/11/17  4. Tobacco abuse - Congratulated on his cessation.   5. CAD - No s/s of ischemia.    - Mild non-obstructive CAD with 50% proximal RCA with spasm on cath 09/06/17 - Continue atorvastatin 20 mg daily.   6. Pulmonary nodules.  - Scattered pulmonary nodules noted on CT 09/07/17 with repeat recommended in 3-6 months. No change.   Meds and labs as above. RTC 4 weeks.   Shirley Friar, PA-C 11/16/17   Greater than 50% of the 30 minute visit was spent in counseling/coordination of care regarding disease state education, salt/fluid restriction, sliding scale diuretics, and medication compliance.

## 2017-11-28 ENCOUNTER — Encounter: Payer: Self-pay | Admitting: Thoracic Surgery (Cardiothoracic Vascular Surgery)

## 2017-11-28 ENCOUNTER — Ambulatory Visit (HOSPITAL_COMMUNITY)
Admission: RE | Admit: 2017-11-28 | Discharge: 2017-11-28 | Disposition: A | Discharge status: Home / Self Care | Payer: Medicare HMO | Source: Ambulatory Visit | Attending: Cardiology | Admitting: Cardiology

## 2017-11-28 DIAGNOSIS — I5022 Chronic systolic (congestive) heart failure: Secondary | ICD-10-CM | POA: Diagnosis not present

## 2017-11-28 LAB — BASIC METABOLIC PANEL
Anion gap: 12 (ref 5–15)
BUN: 14 mg/dL (ref 6–20)
CO2: 23 mmol/L (ref 22–32)
CREATININE: 0.97 mg/dL (ref 0.61–1.24)
Calcium: 9.1 mg/dL (ref 8.9–10.3)
Chloride: 105 mmol/L (ref 101–111)
GFR calc Af Amer: >60 mL/min (ref >60)
GFR calc non Af Amer: >60 mL/min (ref >60)
Glucose, Bld: 124 mg/dL — ABNORMAL HIGH (ref 65–99)
POTASSIUM: 4 mmol/L (ref 3.5–5.1)
Sodium: 140 mmol/L (ref 135–145)

## 2017-11-29 ENCOUNTER — Telehealth (HOSPITAL_COMMUNITY): Payer: Self-pay | Admitting: Pharmacist

## 2017-11-29 NOTE — Telephone Encounter (Signed)
Entresto PA approved by [facility] Part D through 11/29/19.   Ruta Hinds. Velva Harman, PharmD, BCPS, CPP Clinical Pharmacist Phone: [phone] 11/29/2017 11:40 AM

## 2017-12-11 ENCOUNTER — Ambulatory Visit
Admission: RE | Admit: 2017-12-11 | Discharge: 2017-12-11 | Disposition: A | Discharge status: Home / Self Care | Payer: Medicare HMO | Source: Ambulatory Visit | Attending: Thoracic Surgery (Cardiothoracic Vascular Surgery) | Admitting: Thoracic Surgery (Cardiothoracic Vascular Surgery)

## 2017-12-11 ENCOUNTER — Other Ambulatory Visit: Payer: Self-pay | Admitting: Thoracic Surgery (Cardiothoracic Vascular Surgery)

## 2017-12-11 ENCOUNTER — Ambulatory Visit: Payer: Self-pay | Admitting: Thoracic Surgery (Cardiothoracic Vascular Surgery)

## 2017-12-11 DIAGNOSIS — Z952 Presence of prosthetic heart valve: Secondary | ICD-10-CM

## 2017-12-11 DIAGNOSIS — Z954 Presence of other heart-valve replacement: Secondary | ICD-10-CM | POA: Diagnosis not present

## 2017-12-12 ENCOUNTER — Encounter (HOSPITAL_COMMUNITY): Payer: Self-pay

## 2017-12-12 ENCOUNTER — Ambulatory Visit (HOSPITAL_COMMUNITY)
Admission: RE | Admit: 2017-12-12 | Discharge: 2017-12-12 | Disposition: A | Discharge status: Home / Self Care | Payer: Medicare HMO | Source: Ambulatory Visit | Attending: Cardiology | Admitting: Cardiology

## 2017-12-12 DIAGNOSIS — I2510 Atherosclerotic heart disease of native coronary artery without angina pectoris: Secondary | ICD-10-CM | POA: Diagnosis not present

## 2017-12-12 DIAGNOSIS — J449 Chronic obstructive pulmonary disease, unspecified: Secondary | ICD-10-CM | POA: Diagnosis not present

## 2017-12-12 DIAGNOSIS — Z87891 Personal history of nicotine dependence: Secondary | ICD-10-CM | POA: Insufficient documentation

## 2017-12-12 DIAGNOSIS — I350 Nonrheumatic aortic (valve) stenosis: Secondary | ICD-10-CM | POA: Diagnosis not present

## 2017-12-12 DIAGNOSIS — I5022 Chronic systolic (congestive) heart failure: Secondary | ICD-10-CM | POA: Insufficient documentation

## 2017-12-12 DIAGNOSIS — D509 Iron deficiency anemia, unspecified: Secondary | ICD-10-CM | POA: Diagnosis not present

## 2017-12-12 DIAGNOSIS — D649 Anemia, unspecified: Secondary | ICD-10-CM

## 2017-12-12 LAB — CBC
HCT: 44.6 % (ref 39.0–52.0)
Hemoglobin: 14.6 g/dL (ref 13.0–17.0)
MCH: 30.7 pg (ref 26.0–34.0)
MCHC: 32.7 g/dL (ref 30.0–36.0)
MCV: 93.9 fL (ref 78.0–100.0)
PLATELETS: 142 10*3/uL — AB (ref 150–400)
RBC: 4.75 MIL/uL (ref 4.22–5.81)
RDW: 14.5 % (ref 11.5–15.5)
WBC: 7.5 10*3/uL (ref 4.0–10.5)

## 2017-12-12 LAB — IRON AND TIBC
Iron: 79 ug/dL (ref 45–182)
SATURATION RATIOS: 28 % (ref 17.9–39.5)
TIBC: 286 ug/dL (ref 250–450)
UIBC: 207 ug/dL

## 2017-12-12 LAB — FOLATE: Folate: 20.3 ng/mL (ref >5.9)

## 2017-12-12 LAB — FERRITIN: Ferritin: 37 ng/mL (ref 24–336)

## 2017-12-12 LAB — VITAMIN B12: VITAMIN B 12: 315 pg/mL (ref 180–914)

## 2017-12-12 MED ORDER — SPIRONOLACTONE 25 MG PO TABS: 12.5000 mg | ORAL_TABLET | Freq: Every day | ORAL | 5 refills | Status: DC

## 2017-12-12 MED ORDER — ATORVASTATIN CALCIUM 20 MG PO TABS: 20.0000 mg | ORAL_TABLET | Freq: Every day | ORAL | 5 refills | Status: DC

## 2017-12-12 NOTE — Progress Notes (Signed)
HF MD: Kenneth Mills  HPI:  Kenneth Mills is a 67 y.o. Caucasian male with h/o COPD, IBD s/p partial colectomy, Systolic CHF, and severe aortic stenosis.  Admitted 09/01/17 with acute respiratory failure with parainfluenza and + PCT requiring intubation. Echo showed markedly depressed LV dysfunction and critical low-grade AS (new findings).   Cath showed non-obstructive disease, with marginal cardiac output, and severe AS.    Pt s/p AVR 10/18/2017. Required milrinone transiently that admission. Meds titrated as tolerated. CVP low on discharge so not started on Entresto.   He presents today for pharmacist-led HF medication titration. At last HF clinic visit on 11/16/17, he was switched from losartan to Entresto 24-26 mg BID. He stopped taking digoxin and atorvastatin at least 2 weeks ago because he didn't have refills so didn't think he needed it. Feeling great overall.  Denies SOB with ADLs or walking on flat ground. Denies exertional chest pain. Weight slowly trending up at home 157>>164 over "a few weeks". He states he has stopped smoking but starting to crave nicotine again so will restart patches.      . Shortness of breath/dyspnea on exertion? no  . Orthopnea/PND? no . Edema? no . Lightheadedness/dizziness? no . Daily weights at home? Yes - slightly trending up 160-164 lb . Blood pressure/heart rate monitoring at home? No - has automatic cuff but not measuring . Following low-sodium/fluid-restricted diet? No - had a [employer] burger yesterday  HF Medications: Furosemide 20 mg PO daily PRN weight gain >3 lbs in 1 day Corlanor 5 mg PO BID Entresto 24-26 mg PO BID  Has the patient been experiencing any side effects to the medications prescribed?  no  Does the patient have any problems obtaining medications due to transportation or finances?   No - [employer] Part D + PANF grant for [employer] and Entresto copays  Understanding of regimen: good Understanding of indications: good Potential of  compliance: good Patient understands to avoid NSAIDs. Patient understands to avoid decongestants.    Pertinent Lab Values: . 11/28/17: Serum creatinine 0.97, BUN 14, Potassium 4.0, Sodium 140, Digoxin 0.3   Vital Signs: . Weight: 171 lb (dry weight: 160-164 lb) . Blood pressure: 128/74 mmHg  . Heart rate: 90 bpm    Assessment: 1. Chronicsystolic CHF (EF 62-94%), due to NICM. NYHA class II-IIIsymptoms.  - Volume status may be slightly elevated with slow weight gain at home and in clinic today   - Add spironolactone 12.5 mg daily   - Continue Corlanor 5 mg BID and Entresto 24-26 mg BID   - No beta blocker for now with recent low output but eventually consider addition of low dose beta-1 selective metoprolol succinate with COPD (bisoprolol on back order)  - Basic disease state pathophysiology, medication indication, mechanism and side effects reviewed at length with patient and he verbalized understanding 2. COPD - PFTS 09/14/18 better than expected with tobacco use.   - DLCO reduced.  No change.  3. Low-gradient critical aortic stenosis s/p AVR 10/18/2017 - Doing well. Sees Dr. Roxy Manns 12/11/17 - On ASA 325 mg daily  4. Tobacco abuse - Congratulated on his cessation.  5. CAD - No s/s of ischemia.    - Mild non-obstructive CAD with 50% proximal RCA with spasm on cath 09/06/17 - Continue ASA and restart atorvastatin  6. Pulmonary nodules.  - Scattered pulmonary nodules noted on CT 09/07/17 with repeat recommended in 3-6 months. No change.   Plan: 1) Medication changes: Based on clinical presentation, vital signs and recent  labs will start spironolactone 12.5 mg daily 2) Labs: BMET in 2 weeks 3) Follow-up: Pharmacy on 12/25/17 and Dr. Haroldine Mills on 01/30/18   Ruta Hinds. Velva Harman, PharmD, BCPS, CPP Clinical Pharmacist Pager: [phone] Phone: [phone] 12/12/2017 9:46 AM

## 2017-12-12 NOTE — Progress Notes (Signed)
Research Encounter  Patient consented to be screened for IV iron study IRB # [id number]. Anemia panel drawn, quality of life survey completed, and 6-minute walk test administered. Follow-up on anemia panel results and replete iron according to study protocol.   Leroy Libman, PharmD Pharmacy Resident Pager: [phone]

## 2017-12-12 NOTE — Patient Instructions (Addendum)
It was great to meet you today!  Please START spironolactone 1/2 tablet ONCE DAILY.   Please RESTART atorvastatin 20 mg tablet ONCE DAILY.   Blood work today. We will call you with any changes.   You are scheduled to see the pharmacist again on 12/25/17 at 2:00 PM.

## 2017-12-13 ENCOUNTER — Encounter (HOSPITAL_COMMUNITY): Payer: Self-pay

## 2017-12-13 ENCOUNTER — Other Ambulatory Visit: Payer: Self-pay | Admitting: Thoracic Surgery (Cardiothoracic Vascular Surgery)

## 2017-12-13 DIAGNOSIS — Z953 Presence of xenogenic heart valve: Secondary | ICD-10-CM

## 2017-12-13 DIAGNOSIS — D649 Anemia, unspecified: Secondary | ICD-10-CM

## 2017-12-13 NOTE — Progress Notes (Signed)
Research Encounter  Patient's CBC/anemia panel results do not meet inclusion criteria for IV iron study.    Leroy Libman, PharmD Pharmacy Resident Pager: [phone]

## 2017-12-18 ENCOUNTER — Ambulatory Visit: Payer: Self-pay | Admitting: Thoracic Surgery (Cardiothoracic Vascular Surgery)

## 2017-12-19 ENCOUNTER — Ambulatory Visit (INDEPENDENT_AMBULATORY_CARE_PROVIDER_SITE_OTHER): Payer: Self-pay | Admitting: Physician Assistant

## 2017-12-19 VITALS — BP 117/78 | HR 43 | Resp 20 | Ht 71.0 in | Wt 171.0 lb

## 2017-12-19 DIAGNOSIS — I350 Nonrheumatic aortic (valve) stenosis: Secondary | ICD-10-CM

## 2017-12-19 DIAGNOSIS — Z952 Presence of prosthetic heart valve: Secondary | ICD-10-CM

## 2017-12-19 MED ORDER — FUROSEMIDE 20 MG PO TABS: 20.0000 mg | ORAL_TABLET | Freq: Every day | ORAL | 1 refills | Status: DC | PRN

## 2017-12-19 NOTE — Progress Notes (Signed)
HPI:  Patient returns for routine postoperative follow-up having undergone AVR on 10/18/2017. The patient's early postoperative recovery while in the hospital was treated aggressively for heart failure.  Since hospital discharge the patient reports he is doing very well.  He is anxious to be able to do more activity.  He wants to know when he can use a chain saw or weed whacker again.  He denies lower extremity edema and states his weight has been stable.  He is being followed by the advanced heart failure team.  He overall feels he is doing very well.   Current Outpatient Medications  Medication Sig Dispense Refill  . aspirin EC 325 MG EC tablet Take 1 tablet (325 mg total) by mouth daily.    Marland Kitchen atorvastatin (LIPITOR) 20 MG tablet Take 1 tablet (20 mg total) by mouth daily at 6 PM. 30 tablet 5  . clonazePAM (KLONOPIN) 0.5 MG tablet Take 0.5 mg by mouth daily as needed for anxiety.     . furosemide (LASIX) 20 MG tablet Take 1 tablet (20 mg total) by mouth daily as needed. For weight gain more than 3 pounds 30 tablet 1  . ivabradine (CORLANOR) 5 MG TABS tablet Take 1 tablet (5 mg total) by mouth 2 (two) times daily with a meal. 60 tablet 11  . sacubitril-valsartan (ENTRESTO) 24-26 MG Take 1 tablet by mouth 2 (two) times daily. 60 tablet 6  . spironolactone (ALDACTONE) 25 MG tablet Take 0.5 tablets (12.5 mg total) by mouth daily. 15 tablet 5   No current facility-administered medications for this visit.     Physical Exam:  BP 117/78   Pulse (!) 43   Resp 20   Ht 5' 11"  (1.803 m)   Wt 171 lb (77.6 kg)   SpO2 98% Comment: RA  BMI 23.85 kg/m   Gen: no apparent distress Heart: RRR Lungs: CTA bilaterally Ext: no edema Incisions: well healed  Diagnostic Tests:  CXR: no pleural effusion, no pneumothorax.  A/P:  1. S/P AVR- doing very well 2. Chronic CHF- monitored closely by the AHF team, he was given a refill on Lasix that he states he has not used in a while but he only has a few  tablets left.  He was instructed to use per the AHF guidelines he was given 3. Activity-increase as tolerated, instructed on weight restrictions and was advised to not use a chain saw or weed whacker until 12 weeks from date of surgery.  He was also given education on dental prophylaxis to prevent endocarditis 4. RTC in June for 6 month post surgery follow up with Dr. Geraldo Docker, PA-C [facility] [phone]

## 2017-12-19 NOTE — Patient Instructions (Signed)
Endocarditis is a potentially serious infection of heart valves or inside lining of the heart.  It occurs more commonly in patients with diseased heart valves (such as patient's with aortic or mitral valve disease) and in patients who have undergone heart valve repair or replacement.  Certain surgical and dental procedures may put you at risk, such as dental cleaning, other dental procedures, or any surgery involving the respiratory, urinary, gastrointestinal tract, gallbladder or prostate gland.   To minimize your chances for develooping endocarditis, maintain good oral health and seek prompt medical attention for any infections involving the mouth, teeth, gums, skin or urinary tract.    Always notify your doctor or dentist about your underlying heart valve condition before having any invasive procedures. You will need to take antibiotics before certain procedures, including all routine dental cleanings or other dental procedures.  Your cardiologist or dentist should prescribe these antibiotics for you to be taken ahead of time.

## 2017-12-25 ENCOUNTER — Ambulatory Visit (HOSPITAL_COMMUNITY)
Admission: RE | Admit: 2017-12-25 | Discharge: 2017-12-25 | Disposition: A | Discharge status: Home / Self Care | Payer: Medicare HMO | Source: Ambulatory Visit | Attending: Internal Medicine | Admitting: Internal Medicine

## 2017-12-25 ENCOUNTER — Telehealth (HOSPITAL_COMMUNITY): Payer: Self-pay | Admitting: Pharmacist

## 2017-12-25 DIAGNOSIS — F17210 Nicotine dependence, cigarettes, uncomplicated: Secondary | ICD-10-CM | POA: Diagnosis not present

## 2017-12-25 DIAGNOSIS — I5022 Chronic systolic (congestive) heart failure: Secondary | ICD-10-CM | POA: Insufficient documentation

## 2017-12-25 DIAGNOSIS — R918 Other nonspecific abnormal finding of lung field: Secondary | ICD-10-CM | POA: Insufficient documentation

## 2017-12-25 DIAGNOSIS — J449 Chronic obstructive pulmonary disease, unspecified: Secondary | ICD-10-CM | POA: Insufficient documentation

## 2017-12-25 DIAGNOSIS — I2510 Atherosclerotic heart disease of native coronary artery without angina pectoris: Secondary | ICD-10-CM | POA: Diagnosis not present

## 2017-12-25 LAB — BASIC METABOLIC PANEL
Anion gap: 11 (ref 5–15)
BUN: 15 mg/dL (ref 6–20)
CHLORIDE: 104 mmol/L (ref 101–111)
CO2: 23 mmol/L (ref 22–32)
Calcium: 9.7 mg/dL (ref 8.9–10.3)
Creatinine, Ser: 1.45 mg/dL — ABNORMAL HIGH (ref 0.61–1.24)
GFR calc non Af Amer: 48 mL/min — ABNORMAL LOW (ref >60)
GFR, EST AFRICAN AMERICAN: 56 mL/min — AB (ref >60)
Glucose, Bld: 128 mg/dL — ABNORMAL HIGH (ref 65–99)
POTASSIUM: 3.8 mmol/L (ref 3.5–5.1)
SODIUM: 138 mmol/L (ref 135–145)

## 2017-12-25 MED ORDER — METOPROLOL SUCCINATE ER 25 MG PO TB24: 12.5000 mg | ORAL_TABLET | Freq: Every day | ORAL | 3 refills | Status: DC

## 2017-12-25 NOTE — Patient Instructions (Signed)
It was great to meet you today.  Start taking metoprolol succinate 12.32m once daily.  Return to clinic on 01/30/18 to see Dr. BHaroldine Laws

## 2017-12-25 NOTE — Progress Notes (Signed)
HF MD: Bensimhon  HPI:  Kenneth Mills a 68 y.o.Caucasianmalewith h/o COPD, IBD s/p partial colectomy, Systolic CHF, and severe aortic stenosis.  Admitted 09/01/17 with acute respiratory failure with parainfluenza and + PCT requiring intubation. Echo showed markedly depressed LV dysfunction and critical low-grade AS (new findings).Cath showed non-obstructive disease, with marginal cardiac output, and severe AS.   Pt s/p AVR 10/18/2017. Required milrinone transiently that admission.Meds titrated as tolerated. CVP low on discharge so not started on Entresto.    He presents today for pharmacist-led HF medication titration.At last HF clinic visit pt thought to be slightly volume up with weight gain so spironolactone 12.5 mg added, no BB on board with recent low output. Pt endorses compliance with spironolactone and has been taking furosemide 20 mg daily instead of PRN and has noticed increased urination but this is not bothersome. Pt has been taking his furosemide daily as well. Pt denies any S/Sx of volume overload today and wt appears stable.      Shortness of breath/dyspnea on exertion? no   Orthopnea/PND? no  Edema? no  Lightheadedness/dizziness? no  Daily weights at home? yes - stable ~170s   Blood pressure/heart rate monitoring at home? no  Following low-sodium/fluid-restricted diet? No - ate BBQ for lunch  HF Medications: Furosemide 105m PO daily Corlanor 515mPO BID Entresto 24/2645mID  Spironolactone 12.5mg46mily  Has the patient been experiencing any side effects to the medications prescribed?  no  Does the patient have any problems obtaining medications due to transportation or finances?   no  Understanding of regimen: fair Understanding of indications: fair Potential of compliance: fair Patient understands to avoid NSAIDs. Patient understands to avoid decongestants.   Pertinent Lab Values:  BMET 2/18: Serum creatinine 1.45 mg/dl, CO2 23  mmol/L, Potassium 3.8 mmol/L, Sodium 138 mmol/L   Vital Signs:  Weight: 170 lbs (dry weight: 160-164 lbs)  Blood pressure: 120/86 mmHg   Heart rate: 103 bpm   Assessment: 1. Chronicsystolic CHF ([id number]%ue toNICM. NYHA classII-IIIsymptoms. -Add low-dose metoprolol succinate 12.5mg 59mly given elevated HR and stable weight today - avoiding carvedilol with hx COPD and bisoprolol since on backorder. -Continue Lasix 20mg 16my, spironolactone 12.5mg da57m, corlanor 5mg BID51mnd Entresto 24/26mg BID40mnsider uptitration of Entresto or spironolactone at next visit. -Basic disease state pathophysiology, medication indication, mechanism and side effects reviewed at length with patient and he verbalized understanding 2. COPD -PFTS11/8/19better than expected with tobacco use.  -DLCO reduced.No change. 3. Low-gradient critical aortic stenosiss/p AVR 10/18/2017 -Doing well. Sees Dr. Owen 2/4/Kenneth MannsOn ASA 325 mg daily 4. Tobacco abuse -Pt is down to 4-5 cigs/day down from PPD -He has started using NRT patch. 5. CAD -No s/sx of ischemia. -Mild non-obstructive CAD with 50% proximal RCA with spasm on cath 09/06/17 -ContinueASA and atorvastatin 6. Pulmonary nodules.  -Scattered pulmonary nodules noted on CT 09/07/17 with repeat recommended in 3-6 months.No change.   Plan: 1) Medication changes: Based on clinical presentation, vital signs and recent labs will add metoprolol succinate 12.5mg daily34m) Labs: BMET today 3) Follow-up: 6-week visit with MD  Kenneth BiArrie SenateBCPS PGY-2[facility] Pharmacy Resident Pager: [phone]  Patient seen with resident. Agree with above.  Kenneth Mills. NRuta Hinds, Velva HarmanBCPS, CPP Clinical Pharmacist Phone: [phone] 3:50 PM

## 2017-12-25 NOTE — Telephone Encounter (Signed)
Per discussion with Dr. Haroldine Laws, with elevation in SCr, have asked Mr. Saetern to increase furosemide to 40 mg x 3 days then continue 20 mg daily and not start metoprolol succinate yet. Will see back in pharmacy clinic in 1 week to recheck BMET and assess fluid status.   Ruta Hinds. Velva Harman, PharmD, BCPS, CPP Clinical Pharmacist Phone: [phone] 12/25/2017 5:15 PM

## 2018-01-01 ENCOUNTER — Ambulatory Visit (HOSPITAL_COMMUNITY)
Admission: RE | Admit: 2018-01-01 | Discharge: 2018-01-01 | Disposition: A | Discharge status: Home / Self Care | Payer: Medicare HMO | Source: Ambulatory Visit | Attending: Cardiology | Admitting: Cardiology

## 2018-01-01 VITALS — BP 130/82 | HR 87 | Wt 168.8 lb

## 2018-01-01 DIAGNOSIS — I5022 Chronic systolic (congestive) heart failure: Secondary | ICD-10-CM | POA: Diagnosis not present

## 2018-01-01 DIAGNOSIS — R918 Other nonspecific abnormal finding of lung field: Secondary | ICD-10-CM | POA: Diagnosis not present

## 2018-01-01 DIAGNOSIS — K589 Irritable bowel syndrome without diarrhea: Secondary | ICD-10-CM | POA: Insufficient documentation

## 2018-01-01 DIAGNOSIS — I2510 Atherosclerotic heart disease of native coronary artery without angina pectoris: Secondary | ICD-10-CM | POA: Diagnosis not present

## 2018-01-01 DIAGNOSIS — I350 Nonrheumatic aortic (valve) stenosis: Secondary | ICD-10-CM | POA: Insufficient documentation

## 2018-01-01 DIAGNOSIS — J449 Chronic obstructive pulmonary disease, unspecified: Secondary | ICD-10-CM | POA: Insufficient documentation

## 2018-01-01 DIAGNOSIS — F17210 Nicotine dependence, cigarettes, uncomplicated: Secondary | ICD-10-CM | POA: Insufficient documentation

## 2018-01-01 DIAGNOSIS — Z9889 Other specified postprocedural states: Secondary | ICD-10-CM | POA: Diagnosis not present

## 2018-01-01 LAB — BASIC METABOLIC PANEL
ANION GAP: 10 (ref 5–15)
BUN: 17 mg/dL (ref 6–20)
CALCIUM: 9.5 mg/dL (ref 8.9–10.3)
CO2: 23 mmol/L (ref 22–32)
CREATININE: 0.96 mg/dL (ref 0.61–1.24)
Chloride: 105 mmol/L (ref 101–111)
GFR calc Af Amer: >60 mL/min (ref >60)
GLUCOSE: 95 mg/dL (ref 65–99)
Potassium: 4.1 mmol/L (ref 3.5–5.1)
Sodium: 138 mmol/L (ref 135–145)

## 2018-01-01 MED ORDER — SACUBITRIL-VALSARTAN 49-51 MG PO TABS: 1.0000 | ORAL_TABLET | Freq: Two times a day (BID) | ORAL | 5 refills | Status: DC

## 2018-01-01 NOTE — Patient Instructions (Addendum)
It was great to see you today.  Please increase your Entresto to 49-5m twice daily. We will call you regarding your blood work today.  Follow-up with Dr. BHaroldine Lawson 01/30/18 at 9:00am.

## 2018-01-01 NOTE — Progress Notes (Addendum)
HF MD: Bensimhon  HPI: Kenneth Mills a 67 y.o.Caucasianmalewith h/o COPD, IBD s/p partial colectomy, Systolic CHF, and severe aortic stenosis.  Admitted 09/01/17 with acute respiratory failure with parainfluenza and + PCT requiring intubation. Echo showed markedly depressed LV dysfunction and critical low-grade AS (new findings).Cath showed non-obstructive disease, with marginal cardiac output, and severe AS.   Pt s/p AVR 10/18/2017. Required milrinone transiently that admission.Meds titrated as tolerated. CVP low on discharge so not started on Entresto.    He returns for pharmacist-led HF medication titration. At last clinic visit 2/18, it was planned to initiate metoprolol succinate 12.48m daily, but labs revealed his SCr was elevated and there was concern he was slightly volume overloaded so he was instead told to increase furosemide x3 days and return in 1 week for repeat BMET.   Patient returns today for pharmacist-led HF medication titration. Pt reports compliance with higher-dose of Lasix x3 days with increased urination during this time. He denies S/Sx fluid overload at this time which is supported by ReDs Vest reading of 30% in clinic today. He did not pick up the metoprolol prescription yet. Pt states he has a runny nose but denies fever or chills, knows to avoid pseudoephedrine.   Shortness of breath/dyspnea on exertion? no   Orthopnea/PND? no  Edema? no  Lightheadedness/dizziness? no  Daily weights at home? Yes - 163-165 lbs at home, remains stable  Blood pressure/heart rate monitoring at home? no  Following low-sodium/fluid-restricted diet? Somewhat - added Na to eggs yesterday but has been eating more home-cooked meals lately rather than eating out  HF Medications: Furosemide 282mPO daily Corlanor 22m622mO BID Entresto 24/24m42mD Spironolactone 12.22mg 33mly   Has the patient been experiencing any side effects to the medications prescribed?   no  Does the patient have any problems obtaining medications due to transportation or finances?   no - PANF for Entresto   Understanding of regimen: fair Understanding of indications: fair Potential of compliance: good Patient understands to avoid NSAIDs. Patient understands to avoid decongestants.   Pertinent Lab Values:  BMET 2/18:Serum creatinine1.45 mg/dl, CO223 mmol/L, Potassium3.8 mmol/L, Sodium138 mmol/L  BMET 2/24:  Serum creatinine 0.96 mg/dl, CO2 23 mmol/L, Potassium 4.1 mmol/L, Sodium 138 mmol/L  Vital Signs:  Weight: 168.8 (dry weight: 160-164 lbs)  Blood pressure: 130/82 mmHg   Heart rate: 87 bpm   ReDS Vest - 01/01/18 1500      ReDS Vest   MR   No    Estimated volume prior to reading  Med    Fitting Posture  Standing    Height Marker  Tall    Ruler Value  11.5    Center Strip  Aligned    ReDS Value  30        Assessment: 1. Chronicsystolic CHF (EF15-VO16-07%e toNICM. NYHA classII-IIIsymptoms. -Volume status stable in clinic today based on vest reading  -Increase Entresto to 49-51mg 56mgiven slightly elevated BP reading in clinic. -Will continue to hold off on starting beta-blocker for now, consider addition of metoprolol succinate at next visit if stable (avoid carvedilol due to hx COPD, bisoprolol on backorder). -Continue ivabradine 22mg BI51mLasix 20mg da110m and spironolactone 12.22mg dail46m-Basic disease state pathophysiology, medication indication, mechanism and side effects reviewed at length with patient and he verbalized understanding 2. COPD -PFTS11/8/19better than expected with tobacco use.  -DLCO reduced.No change. 3. Low-gradient critical aortic stenosiss/p AVR 10/18/2017 -Doing well. Sees Dr. Owen 6/20Roxy Mills-On ASA 325 mg daily 4. Tobacco  abuse -Pt smokes 4-5 cigarettes/day down from PPD -He hasstarted using NRT patch, but is not sure it is helping. Might consider getting hypnotized for smoking cessation. 5.  CAD -No s/sxof ischemia. -Mild non-obstructive CAD with 50% proximal RCA with spasm on cath 09/06/17 -ContinueASAandatorvastatin 6. Pulmonary nodules.  -Scattered pulmonary nodules noted on CT 09/07/17 with repeat recommended in 3-6 months.No change.  Plan: 1) Medication changes: Based on clinical presentation, vital signs and recent labs will increase Entresto to 49-59m BID. 2) Labs: BMET today and in 2 weeks 3) Follow-up: F/U with Rx clinic 3/11 and Dr. BHaroldine Laws3/26  MArrie Senate PharmD, BNew HollandPGY-2 Cardiology Pharmacy Resident Pager: [phone]/25/2019  Patient seen with resident. Agree with above.  ERuta Mills NVelva Harman PharmD, BCPS, CPP Clinical Pharmacist Phone: [phone]/25/2019 3:28 PM

## 2018-01-15 ENCOUNTER — Encounter (HOSPITAL_COMMUNITY): Payer: Self-pay

## 2018-01-15 ENCOUNTER — Ambulatory Visit (HOSPITAL_COMMUNITY)
Admission: RE | Admit: 2018-01-15 | Discharge: 2018-01-15 | Disposition: A | Discharge status: Home / Self Care | Payer: Medicare HMO | Source: Ambulatory Visit | Attending: Cardiology | Admitting: Cardiology

## 2018-01-15 VITALS — BP 128/82 | HR 97 | Wt 172.2 lb

## 2018-01-15 DIAGNOSIS — I060 Rheumatic aortic stenosis: Secondary | ICD-10-CM | POA: Diagnosis not present

## 2018-01-15 DIAGNOSIS — R918 Other nonspecific abnormal finding of lung field: Secondary | ICD-10-CM | POA: Insufficient documentation

## 2018-01-15 DIAGNOSIS — J449 Chronic obstructive pulmonary disease, unspecified: Secondary | ICD-10-CM | POA: Insufficient documentation

## 2018-01-15 DIAGNOSIS — I5022 Chronic systolic (congestive) heart failure: Secondary | ICD-10-CM | POA: Diagnosis not present

## 2018-01-15 DIAGNOSIS — Z9889 Other specified postprocedural states: Secondary | ICD-10-CM | POA: Insufficient documentation

## 2018-01-15 DIAGNOSIS — I2510 Atherosclerotic heart disease of native coronary artery without angina pectoris: Secondary | ICD-10-CM | POA: Diagnosis not present

## 2018-01-15 DIAGNOSIS — Z9049 Acquired absence of other specified parts of digestive tract: Secondary | ICD-10-CM | POA: Insufficient documentation

## 2018-01-15 DIAGNOSIS — F17210 Nicotine dependence, cigarettes, uncomplicated: Secondary | ICD-10-CM | POA: Diagnosis not present

## 2018-01-15 LAB — BASIC METABOLIC PANEL
ANION GAP: 10 (ref 5–15)
BUN: 16 mg/dL (ref 6–20)
CHLORIDE: 105 mmol/L (ref 101–111)
CO2: 23 mmol/L (ref 22–32)
Calcium: 9.6 mg/dL (ref 8.9–10.3)
Creatinine, Ser: 0.96 mg/dL (ref 0.61–1.24)
GFR calc non Af Amer: >60 mL/min (ref >60)
GLUCOSE: 86 mg/dL (ref 65–99)
Potassium: 4 mmol/L (ref 3.5–5.1)
Sodium: 138 mmol/L (ref 135–145)

## 2018-01-15 MED ORDER — METOPROLOL SUCCINATE ER 25 MG PO TB24: 25.0000 mg | ORAL_TABLET | Freq: Every day | ORAL | 5 refills | Status: DC

## 2018-01-15 MED ORDER — FUROSEMIDE 20 MG PO TABS: 20.0000 mg | ORAL_TABLET | Freq: Every day | ORAL | 5 refills | Status: DC

## 2018-01-15 NOTE — Patient Instructions (Addendum)
It was great to see you today!  Please START metoprolol succinate 25 mg (1 tablet) ONCE DAILY.   Blood work today. We will call you with any changes.   Please keep your appointment with Dr. Haroldine Laws on 01/30/18.

## 2018-01-15 NOTE — Progress Notes (Signed)
HPI:  Kenneth Mills a 67 y.o.Caucasianmalewith h/o COPD, IBD s/p partial colectomy, Systolic CHF, and severe aortic stenosis.  Admitted 09/01/17 with acute respiratory failure with parainfluenza and + PCT requiring intubation. Echo showed markedly depressed LV dysfunction and critical low-grade AS (new findings).Cath showed non-obstructive disease, with marginal cardiac output, and severe AS.   Pt s/p AVR 10/18/2017. Required milrinone transiently that admission.Meds titrated as tolerated. CVP low on discharge so not started on Entresto.    Hereturns for pharmacist-led HF medication titration. At last clinic visit 2/25,pt reported compliance with higher-dose of Lasix x3 dayswithincreased urination during this time. He denied S/Sx fluid overload which was supported by ReDs Vest reading of 30%in clinic.   ReDs Vest reading today was 25%.    Shortness of breath/dyspnea on exertion? no   Orthopnea/PND? no  Edema? no  Lightheadedness/dizziness? no  Daily weights at home? Yes - 163-165 lbs at home, remains stable  Blood pressure/heart rate monitoring at home? no  Following low-sodium/fluid-restricted diet? Somewhat, still adding Na to eggs, but had pancakes this morning instead  HF Medications: Furosemide 42m PO daily Corlanor 531mPO BID Entresto 49/5155mID Spironolactone 12.5mg42mily  Has the patient been experiencing any side effects to the medications prescribed?no  Does the patient have any problems obtaining medications due to transportation or finances?no- PANF for Entresto  Understanding of regimen:fair Understanding of indications:fair Potential of compliance:good Patient understands to avoid NSAIDs. Patient understands to avoid decongestants.   Pertinent Lab Values:  01/15/18: Serum creatinine 0.96, CO223, Potassium4.0, Sodium138  Vital Signs:  Weight: 172 lb (dry weight: ~170 lb )  Blood pressure: 128/82 mmHg    Heart rate: 97 bpm   ReDS Vest - 01/15/18 1400      ReDS Vest   MR   No    Estimated volume prior to reading  Med    Fitting Posture  Standing    Height Marker  Tall    Ruler Value  11.5    Center Strip  Aligned    ReDS Value  25        Assessment: 1. Chronicsystolic CHF (EF15BO17-51%ue toNICM. NYHA classII-IIIsymptoms. -Volume status stable in clinic today based on vest readingof 25% -Start metoprolol succinate 25mg51mly. -Continue Entresto 49-51mg 32m ivabradine 5mg BI1mLasix 20mg da76m and spironolactone 12.5mg dail32m-Basic disease state pathophysiology, medication indication, mechanism and side effects reviewed at length with patient and he verbalized understanding 2. COPD -PFTS11/8/19better than expected with tobacco use.  -DLCO reduced.No change. 3. Low-gradient critical aortic stenosiss/p AVR 10/18/2017 -Doing well. Sees Dr. Owen6/201Laurin Coder 325 mg daily 4. Tobacco abuse -Ptsmokes4-5 cigarettes/day down from PPD -He hasstarted using NRT patch, but is not sureit ishelping. Might consider getting hypnotizedfor smoking cessation. 5. CAD -No s/sxof ischemia. -Mild non-obstructive CAD with 50% proximal RCA with spasm on cath 09/06/17 -ContinueASAandatorvastatin 6. Pulmonary nodules.  -Scattered pulmonary nodules noted on CT 09/07/17 with repeat recommended in 3-6 months.No change.  Plan: 1) Medication changes: Based on clinical presentation, vital signs and recent labs willstart metoprolol succinate 25mg dail34m) Labs:BMET today  3) Follow-up:Dr. Bensimhon 01/30/18   Amna Welker K. Andric Kerce, Velva HarmanBCPS, CPP Clinical Pharmacist Pager: [phone] 1:17 PM

## 2018-01-17 ENCOUNTER — Telehealth (HOSPITAL_COMMUNITY): Payer: Self-pay | Admitting: Pharmacist

## 2018-01-17 NOTE — Telephone Encounter (Signed)
Patient's daughter, Shelba Flake, called stating that Mr. Berti BP this am was 82/55 mmHg and HR 80 bpm. Only major change recently was new start metoprolol succinate 25 mg daily. He is not symptomatic at all. I have advised her to have Mr. Lobdell reduce his metoprolol to 12.5 mg daily and see if this helps with his BP. Katie verbalized understanding and was grateful for the assistance.   Ruta Hinds. Velva Harman, PharmD, BCPS, CPP Clinical Pharmacist Phone: [phone] 01/17/2018 11:55 AM

## 2018-01-18 DIAGNOSIS — F419 Anxiety disorder, unspecified: Secondary | ICD-10-CM | POA: Diagnosis not present

## 2018-01-18 DIAGNOSIS — E785 Hyperlipidemia, unspecified: Secondary | ICD-10-CM | POA: Diagnosis not present

## 2018-01-18 DIAGNOSIS — Z952 Presence of prosthetic heart valve: Secondary | ICD-10-CM | POA: Diagnosis not present

## 2018-01-18 DIAGNOSIS — Z79899 Other long term (current) drug therapy: Secondary | ICD-10-CM | POA: Diagnosis not present

## 2018-01-18 DIAGNOSIS — R7303 Prediabetes: Secondary | ICD-10-CM | POA: Diagnosis not present

## 2018-01-30 ENCOUNTER — Encounter (HOSPITAL_COMMUNITY): Payer: Self-pay | Admitting: Internal Medicine

## 2018-01-30 ENCOUNTER — Ambulatory Visit (HOSPITAL_COMMUNITY)
Admission: RE | Admit: 2018-01-30 | Discharge: 2018-01-30 | Disposition: A | Discharge status: Home / Self Care | Payer: Medicare HMO | Source: Ambulatory Visit | Attending: Internal Medicine | Admitting: Internal Medicine

## 2018-01-30 ENCOUNTER — Other Ambulatory Visit: Payer: Self-pay

## 2018-01-30 VITALS — BP 121/70 | HR 82 | Wt 171.0 lb

## 2018-01-30 DIAGNOSIS — F17210 Nicotine dependence, cigarettes, uncomplicated: Secondary | ICD-10-CM | POA: Insufficient documentation

## 2018-01-30 DIAGNOSIS — K589 Irritable bowel syndrome without diarrhea: Secondary | ICD-10-CM | POA: Insufficient documentation

## 2018-01-30 DIAGNOSIS — J449 Chronic obstructive pulmonary disease, unspecified: Secondary | ICD-10-CM | POA: Diagnosis not present

## 2018-01-30 DIAGNOSIS — I350 Nonrheumatic aortic (valve) stenosis: Secondary | ICD-10-CM

## 2018-01-30 DIAGNOSIS — Z953 Presence of xenogenic heart valve: Secondary | ICD-10-CM

## 2018-01-30 DIAGNOSIS — I2510 Atherosclerotic heart disease of native coronary artery without angina pectoris: Secondary | ICD-10-CM | POA: Diagnosis not present

## 2018-01-30 DIAGNOSIS — Z79899 Other long term (current) drug therapy: Secondary | ICD-10-CM | POA: Insufficient documentation

## 2018-01-30 DIAGNOSIS — K219 Gastro-esophageal reflux disease without esophagitis: Secondary | ICD-10-CM | POA: Diagnosis not present

## 2018-01-30 DIAGNOSIS — Z7982 Long term (current) use of aspirin: Secondary | ICD-10-CM | POA: Insufficient documentation

## 2018-01-30 DIAGNOSIS — I5022 Chronic systolic (congestive) heart failure: Secondary | ICD-10-CM | POA: Diagnosis not present

## 2018-01-30 DIAGNOSIS — Z720 Tobacco use: Secondary | ICD-10-CM

## 2018-01-30 DIAGNOSIS — R911 Solitary pulmonary nodule: Secondary | ICD-10-CM

## 2018-01-30 DIAGNOSIS — I447 Left bundle-branch block, unspecified: Secondary | ICD-10-CM | POA: Insufficient documentation

## 2018-01-30 DIAGNOSIS — Z9049 Acquired absence of other specified parts of digestive tract: Secondary | ICD-10-CM | POA: Diagnosis not present

## 2018-01-30 DIAGNOSIS — Z9889 Other specified postprocedural states: Secondary | ICD-10-CM | POA: Insufficient documentation

## 2018-01-30 DIAGNOSIS — F419 Anxiety disorder, unspecified: Secondary | ICD-10-CM | POA: Diagnosis not present

## 2018-01-30 MED ORDER — SPIRONOLACTONE 25 MG PO TABS: 12.5000 mg | ORAL_TABLET | Freq: Every day | ORAL | 6 refills | Status: DC

## 2018-01-30 NOTE — Progress Notes (Signed)
Advanced Heart Failure Clinic Note   Primary Cardiologist: Dr. Haroldine Laws   HPI:  Kenneth Mills is a 67 y.o. male with h/o COPD, IBD s/p partial colectomy, Systolic CHF, and severe aortic stenosis s/p AVR 10/2017.  Admitted 09/01/17 with acute respiratory failure with parainfluenza and + PCT requiring intubation. Echo showed markedly depressed LV dysfunction and critical low-grade AS (new findings).   Cath showed non-obstructive disease, with marginal cardiac output, and severe AS.    TAVR team consulted and work up began. Pt had cardiac CTs and Carotid dopplers inpatient. PFTs and outpatient surgery follow up arranged.   Pt s/p AVR 10/18/2017. Required milrinone transiently that admission. Meds titrated as tolerated. CVP low on discharge so not started on Entresto.    He presents today for HF follow up. Overall doing well. He has been following with PharmD for medication adjustment. Last change was starting Toprol XL 25 mg daily. He had to decrease to 12.5 mg daily because his BP dropped to SBP 80s. He was asymptomatic. SBPs 90-110s at home since decreasing Toprol. No SOB on exertion. No CP, dizziness, orthopnea, PND, or edema. No problem with ADLs. Compliant with medications. Weights at home 173-170 lbs. Trying to limit fluid and salt. He smokes a cigarette "every once in a while".   Review of systems complete and found to be negative unless listed in HPI.    PFTs 09/14/17 FVC-pre 4.13     (91%) FVC-post 3.85   (84%) FEV1-pre 2.53   (75%) FEV1-post 2.46 (72%) TLC 6.19 (88%) DLCO 17.26 (53%) (uncorrected)  Echo 09/02/17: EF 15% diffuse HK Normal RV. Critical low-gradient AS  RHC/LHC 09/05/2017  Prox RCA lesion, 50 %stenosed.  Mid RCA lesion, 30 %stenosed.  Mid LAD lesion, 30 %stenosed.  Mid Cx to Dist Cx lesion, 20 %stenosed.  Findings:  Ao = 96/67 (81) LV 140/19 RA = 4 RV = 31/7 PA = 28/13 (21) PCW = 14 Fick cardiac output/index =3.7/1.9 PVR = 1.9 WU Ao sat =  97% PA sat = 69%, 65%  Aortic valve Peak gradient 46mHG Mean gradient 24 mmHG  Past Medical History:  Diagnosis Date  . Anxiety   . Aortic stenosis, severe    a. suspect low flow, low gradient severe AS.   .Marland KitchenCancer (HCC)    squamous cell - right ear  . [place] disease[place]   . GERD (gastroesophageal reflux disease)   . Headache   . Heart murmur   . IBD (inflammatory bowel disease)    a. s/p partial colectomy in 2009  . Incidental pulmonary nodule, > 324mand < 19m91m1/11/2016   Several small nodules in left lung  . LBBB (left bundle branch block)   . [facility]  a. 08/2014: EF 15% suspect to be endstage CM 2/2 severe AS  . Pneumonia 2018  . S/P aortic valve replacement with bioprosthetic valve 10/18/2017   [facility] stented bovine pericardial tissue valve  . Tobacco abuse    Current Outpatient Medications  Medication Sig Dispense Refill  . aspirin EC 325 MG EC tablet Take 1 tablet (325 mg total) by mouth daily.    . aMarland Kitchenorvastatin (LIPITOR) 20 MG tablet Take 1 tablet (20 mg total) by mouth daily at 6 PM. 30 tablet 5  . clonazePAM (KLONOPIN) 0.5 MG tablet Take 0.5 mg by mouth daily as needed for anxiety.     . furosemide (LASIX) 20 MG tablet Take 1 tablet (  20 mg total) by mouth daily. 30 tablet 5  . ivabradine (CORLANOR) 5 MG TABS tablet Take 1 tablet (5 mg total) by mouth 2 (two) times daily with a meal. 60 tablet 11  . metoprolol succinate (TOPROL-XL) 25 MG 24 hr tablet Take 12.5 mg by mouth daily.    . sacubitril-valsartan (ENTRESTO) 49-51 MG Take 1 tablet by mouth 2 (two) times daily. 60 tablet 5  . spironolactone (ALDACTONE) 25 MG tablet Take 0.5 tablets (12.5 mg total) by mouth daily. 15 tablet 5   No current facility-administered medications for this encounter.    No Known Allergies  Social History   Socioeconomic History  . Marital status: Married    Spouse name: Not on file  . Number of  children: Not on file  . Years of education: Not on file  . Highest education level: Not on file  Occupational History  . Not on file  Social Needs  . Financial resource strain: Not on file  . Food insecurity:    Worry: Not on file    Inability: Not on file  . Transportation needs:    Medical: Not on file    Non-medical: Not on file  Tobacco Use  . Smoking status: Former Smoker    Packs/day: 2.00    Types: Cigarettes    Start date: 1966    Last attempt to quit: 10/18/2017    Years since quitting: 0.2  . Smokeless tobacco: Former Systems developer  . Tobacco comment: as of 10/17/17 has not smoked for 26 hours  Substance and Sexual Activity  . Alcohol use: Yes    Comment: rarely  . Drug use: No  . Sexual activity: Not on file  Lifestyle  . Physical activity:    Days per week: Not on file    Minutes per session: Not on file  . Stress: Not on file  Relationships  . Social connections:    Talks on phone: Not on file    Gets together: Not on file    Attends religious service: Not on file    Active member of club or organization: Not on file    Attends meetings of clubs or organizations: Not on file    Relationship status: Not on file  . Intimate partner violence:    Fear of current or ex partner: Not on file    Emotionally abused: Not on file    Physically abused: Not on file    Forced sexual activity: Not on file  Other Topics Concern  . Not on file  Social History Narrative  . Not on file   Family History  Problem Relation Age of Onset  . Hypertension Father   . Heart attack Father   . Lung cancer Brother    Vitals:   01/30/18 0903  BP: 121/70  Pulse: 82  SpO2: 98%  Weight: 171 lb (77.6 kg)   Wt Readings from Last 3 Encounters:  01/30/18 171 lb (77.6 kg)  01/15/18 172 lb 3.2 oz (78.1 kg)  01/01/18 168 lb 12.8 oz (76.6 kg)    PHYSICAL EXAM: General: Well appearing. No resp difficulty. HEENT: Normal Neck: Supple. JVP flat. Carotids 2+ bilat; no bruits. No  thyromegaly or nodule noted. Cor: Sternum well healed PMI nondisplaced. RRR, No M/G/R noted Lungs: CTAB, normal effort. Mildly decreased BS throughout  Abdomen: Soft, non-tender, non-distended, no HSM. No bruits or masses. +BS  Extremities: No cyanosis, clubbing, or rash. R and LLE no edema.  Neuro: Alert & orientedx3, cranial  nerves grossly intact. moves all 4 extremities w/o difficulty. Affect pleasant  ASSESSMENT & PLAN:   1. Chronic systolic CHF - Echo 09/62/83 LVEF 15%, RV normal, critical low gradient AS; TEE 12/18: EF 20-25%, normal RV, mild MR, trace TR - NYHA II-III - Volume status stable on exam - Continue lasix 20 mg daily - Continue Entresto 49/51 mg BID - Continue digoxin 0.125 mg daily.  - Continue spironolactone 12.5 mg daily.  - Continue Toprol XL 12.5 mg Daily - Continue ivabradine 5 mg BID - Reinforced fluid restriction to < 2 L daily, sodium restriction to less than 2000 mg daily, and the importance of daily weights.    2. COPD - PFTS 09/14/18 better than expected with tobacco use.   - DLCO reduced.  No change.   3. Low-gradient critical aortic stenosis s/p AVR 10/18/2017 - Doing well. Able to increase activity as tolerated. Aware of need for dental prophylaxis.   4. Tobacco abuse - He smokes a cigarette occasionally. Encouraged complete cessation.   5. CAD - No s/s ischemia  - Mild non-obstructive CAD with 50% proximal RCA with spasm on cath 09/06/17 - Continue atorvastatin 20 mg daily + ASA 325 mg daily  6. Pulmonary nodules.  - Scattered pulmonary nodules noted on CT 09/07/17 with repeat recommended in 3-6 months. He is due for repeat by May 2019.   Repeat Echo at follow up CT scan for pulmonary nodules?  Georgiana Shore, NP 01/30/18   Patient seen and examined with the above-signed Advanced Practice Provider and/or Housestaff. I personally reviewed laboratory data, imaging studies and relevant notes. I independently examined the patient and formulated  the important aspects of the plan. I have edited the note to reflect any of my changes or salient points. I have personally discussed the plan with the patient and/or family.  He continues to improve slowly. Now NYHA II-III. Med titration now limited by low BP. Will not adjust today. Will need echo to re-evaluate EF and make decision regarding need for ICD. No evidence of ischemia. Continue ASA/statin. Encouraged smoking cessation. Repeat CT scan in May for pulmonary nodules.   Glori Bickers, MD  9:50 AM

## 2018-01-30 NOTE — Patient Instructions (Signed)
Your physician has requested that you have an echocardiogram. Echocardiography is a painless test that uses sound waves to create images of your heart. It provides your doctor with information about the size and shape of your heart and how well your heart's chambers and valves are working. This procedure takes approximately one hour. There are no restrictions for this procedure.  CT Scan of chest  Your physician recommends that you schedule a follow-up appointment in: 3 months

## 2018-01-30 NOTE — Addendum Note (Signed)
Encounter addended by: Scarlette Calico, RN on: 01/30/2018 9:59 AM  Actions taken: Visit diagnoses modified, Order list changed, Diagnosis association updated, Sign clinical note

## 2018-02-05 ENCOUNTER — Ambulatory Visit (HOSPITAL_COMMUNITY)
Admission: RE | Admit: 2018-02-05 | Discharge: 2018-02-05 | Disposition: A | Discharge status: Home / Self Care | Payer: Medicare HMO | Source: Ambulatory Visit | Attending: Internal Medicine | Admitting: Internal Medicine

## 2018-02-05 DIAGNOSIS — I340 Nonrheumatic mitral (valve) insufficiency: Secondary | ICD-10-CM | POA: Diagnosis not present

## 2018-02-05 DIAGNOSIS — J449 Chronic obstructive pulmonary disease, unspecified: Secondary | ICD-10-CM | POA: Insufficient documentation

## 2018-02-05 DIAGNOSIS — I517 Cardiomegaly: Secondary | ICD-10-CM | POA: Insufficient documentation

## 2018-02-05 DIAGNOSIS — I447 Left bundle-branch block, unspecified: Secondary | ICD-10-CM | POA: Diagnosis not present

## 2018-02-05 DIAGNOSIS — I5022 Chronic systolic (congestive) heart failure: Secondary | ICD-10-CM | POA: Insufficient documentation

## 2018-02-05 DIAGNOSIS — Z87891 Personal history of nicotine dependence: Secondary | ICD-10-CM | POA: Insufficient documentation

## 2018-02-05 DIAGNOSIS — I429 Cardiomyopathy, unspecified: Secondary | ICD-10-CM | POA: Diagnosis not present

## 2018-02-05 NOTE — Progress Notes (Signed)
  Echocardiogram 2D Echocardiogram has been performed.  Kenneth Mills M 02/05/2018, 2:34 PM

## 2018-02-13 ENCOUNTER — Telehealth (HOSPITAL_COMMUNITY): Payer: Self-pay | Admitting: *Deleted

## 2018-02-13 DIAGNOSIS — I5022 Chronic systolic (congestive) heart failure: Secondary | ICD-10-CM

## 2018-02-13 NOTE — Telephone Encounter (Signed)
Result Notes for ECHOCARDIOGRAM COMPLETE   Notes recorded by Darron Doom, RN on 02/13/2018 at 2:05 PM EDT Patient called back and he is agreeable with going to see EP to discuss more about ICD. Referral placed and patient is aware they will be contact him for initial appointment. ------  Notes recorded by Scarlette Calico, RN on 02/13/2018 at 11:25 AM EDT Per Dr Haroldine Laws refer to EP for ICD if pt agreeable, attempted to call pt and Left message to call back ------  Notes recorded by Bensimhon, Shaune Pascal, MD on 02/06/2018 at 3:50 PM EDT EF still down 20-25%

## 2018-02-20 ENCOUNTER — Telehealth: Payer: Self-pay

## 2018-02-20 NOTE — Telephone Encounter (Signed)
Leah with [facility] called to get authorization for a dental cleaning for Kenneth Mills.  She asked if amoxicillin was appropriate.  Advised that it is appropriate and there is not a wait time after surgery to have teeth cleaning.  She acknowledged receipt.

## 2018-02-23 ENCOUNTER — Encounter: Payer: Self-pay | Admitting: Internal Medicine

## 2018-02-23 ENCOUNTER — Ambulatory Visit: Payer: Medicare HMO | Admitting: Internal Medicine

## 2018-02-23 VITALS — BP 126/66 | HR 73 | Ht 71.0 in | Wt 171.0 lb

## 2018-02-23 DIAGNOSIS — I428 Other cardiomyopathies: Secondary | ICD-10-CM

## 2018-02-23 DIAGNOSIS — I447 Left bundle-branch block, unspecified: Secondary | ICD-10-CM

## 2018-02-23 NOTE — Patient Instructions (Addendum)
Medication Instructions:  Your physician recommends that you continue on your current medications as directed. Please refer to the Current Medication list given to you today.  Labwork: None ordered.  Testing/Procedures: Your physician has recommended that you have a defibrillator inserted. An implantable cardioverter defibrillator (ICD) is a small device that is placed in your chest or, in rare cases, your abdomen. This device uses electrical pulses or shocks to help control life-threatening, irregular heartbeats that could lead the heart to suddenly stop beating (sudden cardiac arrest). Leads are attached to the ICD that goes into your heart. This is done in the hospital and usually requires an overnight stay. Please see the instruction sheet given to you today for more information.  Follow-Up:  The following days are available for procedures:  April 22, 23, 24, and 30 May 1, 6, 8, 9, 22, 24, 28 and 30 If you decide on a day please give me a call:  Bing Neighbors [phone]  Any Other Special Instructions Will Be Listed Below (If Applicable).  If you need a refill on your cardiac medications before your next appointment, please call your pharmacy.    Cardioverter Defibrillator Implantation An implantable cardioverter defibrillator (ICD) is a small device that is placed under the skin in the chest or abdomen. An ICD consists of a battery, a small computer (pulse generator), and wires (leads) that go into the heart. An ICD is used to detect and correct two types of dangerous irregular heartbeats (arrhythmias):  A rapid heart rhythm (tachycardia).  An arrhythmia in which the lower chambers of the heart (ventricles) contract in an uncoordinated way (fibrillation).  When an ICD detects tachycardia, it sends a low-energy shock to the heart to restore the heartbeat to normal (cardioversion). This signal is usually painless. If cardioversion does not work or if the ICD detects fibrillation, it  delivers a high-energy shock to the heart (defibrillation) to restart the heart. This shock may feel like a strong jolt in the chest. Your health care provider may prescribe an ICD if:  You have had an arrhythmia that originated in the ventricles.  Your heart has been damaged by a disease or heart condition.  Sometimes, ICDs are programmed to act as a device called a pacemaker. Pacemakers can be used to treat a slow heartbeat (bradycardia) or tachycardia by taking over the heart rate with electrical impulses. Tell a health care provider about:  Any allergies you have.  All medicines you are taking, including vitamins, herbs, eye drops, creams, and over-the-counter medicines.  Any problems you or family members have had with anesthetic medicines.  Any blood disorders you have.  Any surgeries you have had.  Any medical conditions you have.  Whether you are pregnant or may be pregnant. What are the risks? Generally, this is a safe procedure. However, problems may occur, including:  Swelling, bleeding, or bruising.  Infection.  Blood clots.  Damage to other structures or organs, such as nerves, blood vessels, or the heart.  Allergic reactions to medicines used during the procedure.  What happens before the procedure? Staying hydrated Follow instructions from your health care provider about hydration, which may include:  Up to 2 hours before the procedure - you may continue to drink clear liquids, such as water, clear fruit juice, black coffee, and plain tea.  Eating and drinking restrictions Follow instructions from your health care provider about eating and drinking, which may include:  8 hours before the procedure - stop eating heavy meals or foods such  as meat, fried foods, or fatty foods.  6 hours before the procedure - stop eating light meals or foods, such as toast or cereal.  6 hours before the procedure - stop drinking milk or drinks that contain milk.  2 hours  before the procedure - stop drinking clear liquids.  Medicine Ask your health care provider about:  Changing or stopping your normal medicines. This is important if you take diabetes medicines or blood thinners.  Taking medicines such as aspirin and ibuprofen. These medicines can thin your blood. Do not take these medicines before your procedure if your doctor tells you not to.  Tests  You may have blood tests.  You may have a test to check the electrical signals in your heart (electrocardiogram, ECG).  You may have imaging tests, such as a chest X-ray. General instructions  For 24 hours before the procedure, stop using products that contain nicotine or tobacco, such as cigarettes and e-cigarettes. If you need help quitting, ask your health care provider.  Plan to have someone take you home from the hospital or clinic.  You may be asked to shower with a germ-killing soap. What happens during the procedure?  To reduce your risk of infection: ? Your health care team will wash or sanitize their hands. ? Your skin will be washed with soap. ? Hair may be removed from the surgical area.  Small monitors will be put on your body. They will be used to check your heart, blood pressure, and oxygen level.  An IV tube will be inserted into one of your veins.  You will be given one or more of the following: ? A medicine to help you relax (sedative). ? A medicine to numb the area (local anesthetic). ? A medicine to make you fall asleep (general anesthetic).  Leads will be guided through a blood vessel into your heart and attached to your heart muscles. Depending on the ICD, the leads may go into one ventricle or they may go into both ventricles and into an upper chamber of the heart. An X-ray machine (fluoroscope) will be usedto help guide the leads.  A small incision will be made to create a deep pocket under your skin.  The pulse generator will be placed into the pocket.  The ICD will  be tested.  The incision will be closed with stitches (sutures), skin glue, or staples.  A bandage (dressing) will be placed over the incision. This procedure may vary among health care providers and hospitals. What happens after the procedure?  Your blood pressure, heart rate, breathing rate, and blood oxygen level will be monitored often until the medicines you were given have worn off.  A chest X-ray will be taken to check that the ICD is in the right place.  You will need to stay in the hospital for 1-2 days so your health care provider can make sure your ICD is working.  Do not drive for 24 hours if you received a sedative. Ask your health care provider when it is safe for you to drive.  You may be given an identification card explaining that you have an ICD. Summary  An implantable cardioverter defibrillator (ICD) is a small device that is placed under the skin in the chest or abdomen. It is used to detect and correct dangerous irregular heartbeats (arrhythmias).  An ICD consists of a battery, a small computer (pulse generator), and wires (leads) that go into the heart.  When an ICD detects rapid heart  rhythm (tachycardia), it sends a low-energy shock to the heart to restore the heartbeat to normal (cardioversion). If cardioversion does not work or if the ICD detects uncoordinated heart contractions (fibrillation), it delivers a high-energy shock to the heart (defibrillation) to restart the heart.  You will need to stay in the hospital for 1-2 days to make sure your ICD is working. This information is not intended to replace advice given to you by your health care provider. Make sure you discuss any questions you have with your health care provider. Document Released: 07/16/2002 Document Revised: 11/02/2016 Document Reviewed: 11/02/2016 Elsevier Interactive Patient Education  2017 [employer].

## 2018-02-23 NOTE — Progress Notes (Signed)
HPI [name] is referred today by Dr. Reine Mills for consideration for ICD insertion. He is a pleasant 67 yo man with chronic systolic heart failure, aortic stenosis, s/p TAVR, HTN, and LBBB. He has undergone SAVR and improved. He continues to have fatigue and weakness. It was hoped that his EF would improve after SAVR but he still has severe LV dysfunction in the setting of LBBB. He denies syncope.  No Known Allergies   Current Outpatient Medications  Medication Sig Dispense Refill  . aspirin EC 325 MG EC tablet Take 1 tablet (325 mg total) by mouth daily.    Marland Kitchen atorvastatin (LIPITOR) 20 MG tablet Take 1 tablet (20 mg total) by mouth daily at 6 PM. 30 tablet 5  . clonazePAM (KLONOPIN) 0.5 MG tablet Take 0.5 mg by mouth daily as needed for anxiety.     . furosemide (LASIX) 20 MG tablet Take 1 tablet (20 mg total) by mouth daily. 30 tablet 5  . ivabradine (CORLANOR) 5 MG TABS tablet Take 1 tablet (5 mg total) by mouth 2 (two) times daily with a meal. 60 tablet 11  . metoprolol succinate (TOPROL-XL) 25 MG 24 hr tablet Take 12.5 mg by mouth daily.    . sacubitril-valsartan (ENTRESTO) 49-51 MG Take 1 tablet by mouth 2 (two) times daily. 60 tablet 5  . spironolactone (ALDACTONE) 25 MG tablet Take 0.5 tablets (12.5 mg total) by mouth daily. 15 tablet 6   No current facility-administered medications for this visit.      Past Medical History:  Diagnosis Date  . Anxiety   . Aortic stenosis, severe    a. suspect low flow, low gradient severe AS.   Marland Kitchen Cancer (HCC)    squamous cell - right ear  . COPD (chronic obstructive pulmonary disease) ([place])   . GERD (gastroesophageal reflux disease)   . Headache   . Heart murmur   . IBD (inflammatory bowel disease)    a. s/p partial colectomy in 2009  . Incidental pulmonary nodule, > 86m and < 880m11/11/2016   Several small nodules in left lung  . LBBB (left bundle branch block)   . NICM ([place]ischemic cardiomyopathy) ([facility]   a. 08/2014: EF 15%  suspect to be endstage CM 2/2 severe AS  . Pneumonia 2018  . S/P aortic valve replacement with bioprosthetic valve 10/18/2017   [facility] bovine pericardial tissue valve  . Tobacco abuse     ROS:   All systems reviewed and negative except as noted in the HPI.   Past Surgical History:  Procedure Laterality Date  . AORTIC VALVE REPLACEMENT N/A 10/18/2017   Procedure: AORTIC VALVE REPLACEMENT;  Surgeon: Kenneth Mills;  [facility] Service: Open [facility] Surgery;  Laterality: N/A;  . COLON SURGERY     bowel resection, for blockage  . HERNIA REPAIR Left    left inguinal  . IR FLUORO GUIDE CV MIDLINE PICC LEFT  09/04/2017  . RIGHT/LEFT HEART CATH AND CORONARY ANGIOGRAPHY N/A 09/06/2017   Procedure: RIGHT/LEFT HEART CATH AND CORONARY ANGIOGRAPHY;  Surgeon: Kenneth Mills;  [facility];  Service: Cardiovascular;  Laterality: N/A;  . STERNOTOMY  10/18/2017   Procedure: STERNOTOMY;  Surgeon: Kenneth Mills;  [facility];  Service: Open Heart Surgery;;  . TEE WITHOUT CARDIOVERSION N/A 10/18/2017   Procedure: TRANSESOPHAGEAL ECHOCARDIOGRAM (TEE);  Surgeon: Kenneth Mills;  [facility] Service: Open  [facility] Surgery;  Laterality: N/A;     Family History  Problem Relation Age of Onset  . Hypertension Father   . Heart attack Father   . Lung cancer Brother      Social History   Socioeconomic History  . Marital status: Married    Spouse name: Not on file  . Number of children: Not on file  . Years of education: Not on file  . Highest education level: Not on file  Occupational History  . Not on file  Social Needs  . Financial resource strain: Not on file  . Food insecurity:    Worry: Not on file    Inability: Not on file  . Transportation needs:    Medical: Not on file    Non-medical: Not on file  Tobacco Use  . Smoking status: Former Smoker    Packs/day: 2.00    Types: Cigarettes    Start date:  1966    Last attempt to quit: 10/18/2017    Years since quitting: 0.3  . Smokeless tobacco: Former Systems developer  . Tobacco comment: as of 10/17/17 has not smoked for 26 hours  Substance and Sexual Activity  . Alcohol use: Yes    Comment: rarely  . Drug use: No  . Sexual activity: Not on file  Lifestyle  . Physical activity:    Days per week: Not on file    Minutes per session: Not on file  . Stress: Not on file  Relationships  . Social connections:    Talks on phone: Not on file    Gets together: Not on file    Attends religious service: Not on file    Active member of club or organization: Not on file    Attends meetings of clubs or organizations: Not on file    Relationship status: Not on file  . Intimate partner violence:    Fear of current or ex partner: Not on file    Emotionally abused: Not on file    Physically abused: Not on file    Forced sexual activity: Not on file  Other Topics Concern  . Not on file  Social History Narrative  . Not on file     BP 126/66   Pulse 73   Ht 5' 11"  (1.803 m)   Wt 171 lb (77.6 kg)   BMI 23.85 kg/m   Physical Exam:  Well appearing 67 yo man, NAD HEENT: Unremarkable Neck:  No JVD, no thyromegally Lymphatics:  No adenopathy Back:  No CVA tenderness Lungs:  Clear with no wheezes HEART:  Regular rate rhythm, no murmurs, no rubs, no clicks Abd:  soft, positive bowel sounds, no organomegally, no rebound, no guarding Ext:  2 plus pulses, no edema, no cyanosis, no clubbing Skin:  No rashes no nodules Neuro:  CN II through XII intact, motor grossly intact  EKG - NSR with LBBB   Assess/Plan: 1. Chronic systolic heart failure - he is on maximal medical therapy. I have discussed the indications/risks/benefits/goals/expectations of BiV ICD insertion and he will call us if he would like to proceed. 2. Aortic stenosis - he is s/p SAVR and stable.  3. CAD - he has non-obstructive disease. He will undergo watchful waiting. He dose not have  angina.  Kenneth Bosworth.D.

## 2018-03-09 DIAGNOSIS — Z1211 Encounter for screening for malignant neoplasm of colon: Secondary | ICD-10-CM | POA: Diagnosis not present

## 2018-03-20 ENCOUNTER — Telehealth (HOSPITAL_COMMUNITY): Payer: Self-pay | Admitting: *Deleted

## 2018-03-20 NOTE — Telephone Encounter (Signed)
Auth for ct chest w/o contrast is in clinical review. Cancelled 5/15 appointment until auth is approved. Will contact patient and r/s. Pt aware.

## 2018-03-21 ENCOUNTER — Ambulatory Visit (HOSPITAL_COMMUNITY): Payer: Medicare HMO

## 2018-03-28 ENCOUNTER — Ambulatory Visit (HOSPITAL_COMMUNITY)
Admission: RE | Admit: 2018-03-28 | Discharge: 2018-03-28 | Disposition: A | Discharge status: Home / Self Care | Payer: Medicare HMO | Source: Ambulatory Visit | Attending: Internal Medicine | Admitting: Internal Medicine

## 2018-03-28 DIAGNOSIS — R911 Solitary pulmonary nodule: Secondary | ICD-10-CM

## 2018-03-28 DIAGNOSIS — J439 Emphysema, unspecified: Secondary | ICD-10-CM | POA: Insufficient documentation

## 2018-03-28 DIAGNOSIS — R918 Other nonspecific abnormal finding of lung field: Secondary | ICD-10-CM | POA: Diagnosis not present

## 2018-03-28 DIAGNOSIS — I700 Atherosclerosis of aorta: Secondary | ICD-10-CM | POA: Insufficient documentation

## 2018-04-23 ENCOUNTER — Encounter: Payer: Self-pay | Admitting: Thoracic Surgery (Cardiothoracic Vascular Surgery)

## 2018-04-23 ENCOUNTER — Ambulatory Visit: Payer: Medicare HMO | Admitting: Thoracic Surgery (Cardiothoracic Vascular Surgery)

## 2018-04-23 VITALS — BP 112/64 | HR 69 | Resp 20 | Ht 71.0 in | Wt 169.0 lb

## 2018-04-23 DIAGNOSIS — Z953 Presence of xenogenic heart valve: Secondary | ICD-10-CM

## 2018-04-23 DIAGNOSIS — I350 Nonrheumatic aortic (valve) stenosis: Secondary | ICD-10-CM

## 2018-04-23 DIAGNOSIS — R918 Other nonspecific abnormal finding of lung field: Secondary | ICD-10-CM

## 2018-04-23 NOTE — Progress Notes (Signed)
[name]     CARDIOTHORACIC SURGERY OFFICE NOTE  Referring Provider is Bensimhon, Shaune Pascal, MD PCP is Starlyn Skeans, PA-C   HPI:  Patient is a 67 year old male with aortic stenosis, nonischemic cardiomyopathy with chronic systolic just of heart failure, and COPD who returns to the office today for routine follow-up approximately 35-monthstatus post aortic valve replacement using a stented bovine pericardial tissue valve on October 18, 2017.  Findings of surgery were notable for severe left ventricular systolic dysfunction with ejection fraction estimated less than 15%.  The patient's early postoperative recovery was uneventful although notable that he required inotropic support for several days.  He ultimately recovered uneventfully.  He was last seen here in our office for follow-up on December 19, 2017 at which time he was doing well.  He has been followed carefully in the advanced heart failure clinic where he was seen most recently by Dr. BHaroldine Lawson January 30, 2018.  At that time the patient was clinically doing well.  Follow-up echocardiogram performed February 05, 2018 revealed aortic valve prosthesis that was functioning normally with peak velocity across the aortic valve measured 2.35 m/s corresponding to mean transvalvular gradient estimated only 12 mmHg.  Left ventricular systolic function has improved slightly but remain quite low with ejection fraction estimated only 20 to 25%.  The patient was referred for possible ICD implantation and evaluated by Dr. TLovena Leon February 23, 2018.  After speaking with Dr. TLovena Lethe patient decided to hold off on ICD implantation.  He returns to our office today for routine follow-up.  Overall he reports that he is doing quite well.  He notes his breathing is much better than it was prior to surgery.  He still gets short of breath with strenuous exertion but he is exercising  regularly getting along fairly well.  He reportedly continues to avoid smoking.   Current Outpatient Medications  Medication Sig Dispense Refill  . aspirin EC 325 MG EC tablet Take 1 tablet (325 mg total) by mouth daily.    .Marland Kitchenatorvastatin (LIPITOR) 20 MG tablet Take 1 tablet (20 mg total) by mouth daily at 6 PM. 30 tablet 5  . clonazePAM (KLONOPIN) 0.5 MG tablet Take 0.5 mg by mouth daily as needed for anxiety.     . furosemide (LASIX) 20 MG tablet Take 1 tablet (20 mg total) by mouth daily. 30 tablet 5  . ivabradine (CORLANOR) 5 MG TABS tablet Take 1 tablet (5 mg total) by mouth 2 (two) times daily with a meal. 60 tablet 11  . metoprolol succinate (TOPROL-XL) 25 MG 24 hr tablet Take 12.5 mg by mouth daily.    . sacubitril-valsartan (ENTRESTO) 49-51 MG Take 1 tablet by mouth 2 (two) times daily. 60 tablet 5   No current facility-administered medications for this visit.       Physical Exam:   BP 112/64   Pulse 69   Resp 20   Ht 5' 11"  (1.803 m)   Wt 169 lb (76.7 kg)   SpO2 96% Comment: RA  BMI 23.57 kg/m   General:  Well-appearing  Chest:   Clear to auscultation  CV:   Regular rate and rhythm without murmur  Incisions:  Completely healed, sternum is stable  Abdomen:  Soft nontender  Extremities:  Warm and well-perfused, no edema  Diagnostic Tests:  Transthoracic Echocardiography  Patient:    Kenneth Mills, Kenneth Mills MR #:       [id number] Study Date: 02/05/2018 Gender:     M Age:        89 Height:     180.3 cm Weight:     77.6 kg BSA:        1.97 m^2 Pt. Status: Room:   ORDERING     Bensimhon, Daniel  PERFORMING   [facility], Outpatient  SONOGRAPHER  Darlina Sicilian, RDCS  cc:  -------------------------------------------------------------------  ------------------------------------------------------------------- Indications:      Aortic stenosis 424.1.  ------------------------------------------------------------------- History:   PMH:  Left Bundle Branch Block.  Non-Ischemic Cardiomyopathy.  Chronic obstructive pulmonary disease.  Risk factors:  History of Tobacco.  ------------------------------------------------------------------- Study Conclusions  - Left ventricle: LVEF is severely depressed at approximately 20 to   25% with akinesis of the septal, inferior and apical walls;   hypookinesis of the distal lateral, mid/distal anteiorr walls.   The cavity size was mildly dilated. Wall thickness was increased   in a pattern of mild LVH. - Aortic valve: AV prosthesis is difficult to see Peak and mean   gradients through the valve are 22 and 12 mm Hg respectively. - Mitral valve: There was mild regurgitation.  ------------------------------------------------------------------- Labs, prior tests, procedures, and surgery: Valve surgery (10/09/2017).     Aortic valve replacement with a bioprosthetic valve. Edwards Magna Ease Pericardial Tissue Valve 25 mm.  ------------------------------------------------------------------- Study data:  Comparison was made to the study of 10/09/2017.  Study status:  Routine.  Procedure:  The patient reported no pain pre or post test. Transthoracic echocardiography. Image quality was adequate.  Study completion:  There were no complications. Transthoracic echocardiography.  M-mode, complete 2D, spectral Doppler, and color Doppler.  Birthdate:  Patient birthdate: 1951/05/10.  Age:  Patient is 67 yr old.  Sex:  Gender: male. BMI: 23.9 kg/m^2.  Blood pressure:     121/70  Patient status: Inpatient.  Study date:  Study date: 02/05/2018. Study time: 01:45 PM.  Location:  Echo laboratory.  -------------------------------------------------------------------  ------------------------------------------------------------------- Left ventricle:  LVEF is severely depressed at approximately 20 to 25% with akinesis of the septal, inferior and apical walls; hypookinesis of the distal lateral, mid/distal anteiorr  walls. The cavity size was mildly dilated. Wall thickness was increased in a pattern of mild LVH.  ------------------------------------------------------------------- Aortic valve:  AV prosthesis is difficult to see Peak and mean gradients through the valve are 22 and 12 mm Hg respectively. Doppler:  There was no significant regurgitation.    VTI ratio of LVOT to aortic valve: 0.49. Peak velocity ratio of LVOT to aortic valve: 0.51. Mean velocity ratio of LVOT to aortic valve: 0.51. Mean gradient (S): 12 mm Hg. Peak gradient (S): 22 mm Hg.  ------------------------------------------------------------------- Mitral valve:   Mildly thickened leaflets .  Doppler:  There was mild regurgitation.    Peak gradient (D): 6 mm Hg.  ------------------------------------------------------------------- Left atrium:  The atrium was normal in size.  ------------------------------------------------------------------- Right ventricle:  The cavity size was normal. Wall thickness was normal. Systolic function was normal.  ------------------------------------------------------------------- Pulmonic valve:    Structurally normal valve.   Cusp separation was normal.  Doppler:  Transvalvular velocity was within the normal range. There was trivial regurgitation.  ------------------------------------------------------------------- Tricuspid valve:   Structurally normal valve.   Leaflet separation was normal.  Doppler:  Transvalvular velocity was within the normal range. There was trivial regurgitation.  ------------------------------------------------------------------- Right atrium:  The atrium was normal in size.  -------------------------------------------------------------------  Pericardium:  There was no pericardial effusion.  ------------------------------------------------------------------- Systemic veins: Inferior vena cava: The vessel was normal in size. The respirophasic diameter  changes were in the normal range (>= 50%), consistent with normal central venous pressure.  ------------------------------------------------------------------- Measurements   Left ventricle                          Value        Reference  LV ID, ED, PLAX chordal          (H)    53.9  mm     43 - 52  LV ID, ES, PLAX chordal          (H)    54.4  mm     23 - 38  LV fx shortening, PLAX chordal   (L)    -1    %      >=29  LV PW thickness, ED                     10.1  mm     ----------  IVS/LV PW ratio, ED                     1.27         <=1.3  LV ejection fraction, 1-p A4C           28    %      ----------  LV end-diastolic volume, 2-p            214   ml     ----------  LV end-systolic volume, 2-p             164   ml     ----------  LV ejection fraction, 2-p               23    %      ----------  Stroke volume, 2-p                      50    ml     ----------  LV end-diastolic volume/bsa, 2-p        108   ml/m^2 ----------  LV end-systolic volume/bsa, 2-p         83    ml/m^2 ----------  Stroke volume/bsa, 2-p                  25.3  ml/m^2 ----------  Longitudinal strain, TDI                6     %      ----------    Ventricular septum                      Value        Reference  IVS thickness, ED                       12.8  mm     ----------    LVOT                                    Value        Reference  LVOT peak velocity, S  120   cm/s   ----------  LVOT mean velocity, S                   79.3  cm/s   ----------  LVOT VTI, S                             18.6  cm     ----------  LVOT peak gradient, S                   6     mm Hg  ----------    Aortic valve                            Value        Reference  Aortic valve peak velocity, S           235   cm/s   ----------  Aortic valve mean velocity, S           155   cm/s   ----------  Aortic valve VTI, S                     37.8  cm     ----------  Aortic mean gradient, S                 12    mm Hg   ----------  Aortic peak gradient, S                 22    mm Hg  ----------  VTI ratio, LVOT/AV                      0.49         ----------  Velocity ratio, peak, LVOT/AV           0.51         ----------  Velocity ratio, mean, LVOT/AV           0.51         ----------    Aorta                                   Value        Reference  Ascending aorta ID, A-P, S              34    mm     ----------    Left atrium                             Value        Reference  LA ID, A-P, ES                          42    mm     ----------  LA ID/bsa, A-P                          2.13  cm/m^2 <=2.2  LA volume, ES, 1-p A4C                  35    ml     ----------  LA volume/bsa, ES,  1-p A4C              17.7  ml/m^2 ----------    Mitral valve                            Value        Reference  Mitral E-wave peak velocity             122   cm/s   ----------  Mitral deceleration time         (L)    137   ms     150 - 230  Mitral peak gradient, D                 6     mm Hg  ----------    Pulmonary arteries                      Value        Reference  PA pressure, S, DP                      30    mm Hg  <=30    Tricuspid valve                         Value        Reference  Tricuspid regurg peak velocity          258   cm/s   ----------  Tricuspid peak RV-RA gradient           27    mm Hg  ----------    Right atrium                            Value        Reference  RA ID, S-I, ES, A4C                     45.7  mm     34 - 49  RA area, ES, A4C                        12.3  cm^2   8.3 - 19.5  RA volume, ES, A/L                      25.8  ml     ----------  RA volume/bsa, ES, A/L                  13.1  ml/m^2 ----------    Systemic veins                          Value        Reference  Estimated CVP                           3     mm Hg  ----------    Right ventricle                         Value        Reference  TAPSE  8.65  mm     ----------  RV pressure, S, DP                       30    mm Hg  <=30  Legend: (L)  and  (H)  mark values outside specified reference range.  ------------------------------------------------------------------- Prepared and Electronically Authenticated by  Dorris Carnes, M.D. 2019-04-01T20:19:52   Impression:  Patient is clinically doing well approximately 6 months status post aortic valve replacement using a bioprosthetic tissue valve.  Follow-up echocardiogram looked good with normal functioning aortic valve prosthesis and relatively low mean transvalvular gradient.  Patient has underlying severe left ventricular systolic dysfunction which has improved slightly since surgery but remains quite low.  Plan:  We have not recommended any change the patient's current medications.  I attempted to explain the rationale for ICD implantation given the fact that the patient has dilated nonischemic cardiomyopathy.  All of his questions have been addressed.  The patient has been reminded regarding the importance of dental hygiene and the lifelong need for antibiotic prophylaxis for all dental cleanings and other related invasive procedures. Patient will return to our office for routine follow-up in approximately 6 months.  I spent in excess of 15 minutes during the conduct of this office consultation and >50% of this time involved direct face-to-face encounter with the patient for counseling and/or coordination of their care.   Kenneth Gu. Roxy Manns, MD 04/23/2018 3:24 PM

## 2018-04-23 NOTE — Patient Instructions (Signed)
Continue all previous medications without any changes at this time  You may resume unrestricted physical activity without any particular limitations at this time.  Check your weight on a regular basis and keep a log for your records.  Look for signs of fluid overload such as worsening swelling of your lower legs, increased shortness of breath with activity, and/or a dry nonproductive cough.  Discussed these findings with your cardiologist including whether or not you should adjust your fluid pill dosage (diuretic).  Endocarditis is a potentially serious infection of heart valves or inside lining of the heart.  It occurs more commonly in patients with diseased heart valves (such as patient's with aortic or mitral valve disease) and in patients who have undergone heart valve repair or replacement.  Certain surgical and dental procedures may put you at risk, such as dental cleaning, other dental procedures, or any surgery involving the respiratory, urinary, gastrointestinal tract, gallbladder or prostate gland.   To minimize your chances for develooping endocarditis, maintain good oral health and seek prompt medical attention for any infections involving the mouth, teeth, gums, skin or urinary tract.    Always notify your doctor or dentist about your underlying heart valve condition before having any invasive procedures. You will need to take antibiotics before certain procedures, including all routine dental cleanings or other dental procedures.  Your cardiologist or dentist should prescribe these antibiotics for you to be taken ahead of time.

## 2018-05-02 ENCOUNTER — Ambulatory Visit (HOSPITAL_COMMUNITY)
Admission: RE | Admit: 2018-05-02 | Discharge: 2018-05-02 | Disposition: A | Discharge status: Home / Self Care | Payer: Medicare HMO | Source: Ambulatory Visit | Attending: Internal Medicine | Admitting: Internal Medicine

## 2018-05-02 VITALS — BP 122/74 | HR 69 | Wt 171.8 lb

## 2018-05-02 DIAGNOSIS — I428 Other cardiomyopathies: Secondary | ICD-10-CM | POA: Diagnosis not present

## 2018-05-02 DIAGNOSIS — Z79899 Other long term (current) drug therapy: Secondary | ICD-10-CM | POA: Insufficient documentation

## 2018-05-02 DIAGNOSIS — Z801 Family history of malignant neoplasm of trachea, bronchus and lung: Secondary | ICD-10-CM | POA: Diagnosis not present

## 2018-05-02 DIAGNOSIS — I5022 Chronic systolic (congestive) heart failure: Secondary | ICD-10-CM | POA: Insufficient documentation

## 2018-05-02 DIAGNOSIS — Z87891 Personal history of nicotine dependence: Secondary | ICD-10-CM | POA: Insufficient documentation

## 2018-05-02 DIAGNOSIS — J449 Chronic obstructive pulmonary disease, unspecified: Secondary | ICD-10-CM | POA: Insufficient documentation

## 2018-05-02 DIAGNOSIS — I447 Left bundle-branch block, unspecified: Secondary | ICD-10-CM | POA: Diagnosis not present

## 2018-05-02 DIAGNOSIS — Z7982 Long term (current) use of aspirin: Secondary | ICD-10-CM | POA: Insufficient documentation

## 2018-05-02 DIAGNOSIS — F419 Anxiety disorder, unspecified: Secondary | ICD-10-CM | POA: Insufficient documentation

## 2018-05-02 DIAGNOSIS — Z9049 Acquired absence of other specified parts of digestive tract: Secondary | ICD-10-CM | POA: Diagnosis not present

## 2018-05-02 DIAGNOSIS — I2510 Atherosclerotic heart disease of native coronary artery without angina pectoris: Secondary | ICD-10-CM | POA: Insufficient documentation

## 2018-05-02 DIAGNOSIS — Z8249 Family history of ischemic heart disease and other diseases of the circulatory system: Secondary | ICD-10-CM | POA: Insufficient documentation

## 2018-05-02 DIAGNOSIS — Z953 Presence of xenogenic heart valve: Secondary | ICD-10-CM | POA: Insufficient documentation

## 2018-05-02 LAB — BASIC METABOLIC PANEL
ANION GAP: 4 — AB (ref 5–15)
BUN: 17 mg/dL (ref 8–23)
CALCIUM: 9.2 mg/dL (ref 8.9–10.3)
CO2: 27 mmol/L (ref 22–32)
Chloride: 107 mmol/L (ref 98–111)
Creatinine, Ser: 1.03 mg/dL (ref 0.61–1.24)
GFR calc Af Amer: >60 mL/min (ref >60)
GLUCOSE: 143 mg/dL — AB (ref 70–99)
Potassium: 3.9 mmol/L (ref 3.5–5.1)
SODIUM: 138 mmol/L (ref 135–145)

## 2018-05-02 MED ORDER — SPIRONOLACTONE 25 MG PO TABS: 12.5000 mg | ORAL_TABLET | Freq: Every day | ORAL | 3 refills | Status: DC

## 2018-05-02 NOTE — Patient Instructions (Signed)
Start Spironolactone 12.5 mg (1/2 tab)  You have been referred to back to Dr Lovena Le  Your physician recommends that you schedule a follow-up appointment in: 2-3 months

## 2018-05-02 NOTE — Progress Notes (Signed)
Advanced Heart Failure Clinic Note   Primary Cardiologist: Dr. Haroldine Laws   HPI: Kenneth Mills is a 67 y.o. male with h/o COPD, IBD s/p partial colectomy, Systolic CHF, and severe aortic stenosis s/p AVR 10/2017.  Admitted 09/01/17 with acute respiratory failure with parainfluenza and + PCT requiring intubation. Echo showed markedly depressed LV dysfunction and critical low-grade AS (new findings).   Cath showed non-obstructive disease, with marginal cardiac output, and severe AS.    TAVR team consulted and work up began. Pt had cardiac CTs and Carotid dopplers inpatient. PFTs and outpatient surgery follow up arranged.   Pt s/p AVR 10/18/2017. Required milrinone transiently that admission. Meds titrated as tolerated. CVP low on discharge so not started on Entresto.   Referred to EP 02/22/2018.  Today he returns for HF follow up. Since the last visit he was referred to EP for ICD. He saw Dr Lovena Le wanted to hold off on CRT-D. Overall feeling fine. Can do all his activities without limitation as long as he takes his time. Does get SOB with more vigorous activities.   Denies edema/PND/Orthopnea. No chest pain. Appetite ok. No fever or chills. Weight at home 170  pounds. Taking all medications but he has not been taking lasix on a regular basis. No bleeding. He has had lasix today but none last month. Smoking 1/2 PPD.    Review of systems complete and found to be negative unless listed in HPI.    PFTs 09/14/17 FVC-pre 4.13     (91%) FVC-post 3.85   (84%) FEV1-pre 2.53   (75%) FEV1-post 2.46 (72%) TLC 6.19 (88%) DLCO 17.26 (53%) (uncorrected)  ECHO 02/05/2018 EF 20-25%  Echo 09/02/17: EF 15% diffuse HK Normal RV. Critical low-gradient AS  RHC/LHC 09/05/2017  Prox RCA lesion, 50 %stenosed.  Mid RCA lesion, 30 %stenosed.  Mid LAD lesion, 30 %stenosed.  Mid Cx to Dist Cx lesion, 20 %stenosed.  Findings:  Ao = 96/67 (81) LV 140/19 RA = 4 RV = 31/7 PA = 28/13 (21) PCW =  14 Fick cardiac output/index =3.7/1.9 PVR = 1.9 WU Ao sat = 97% PA sat = 69%, 65%  Aortic valve Peak gradient 22mHG Mean gradient 24 mmHG  Past Medical History:  Diagnosis Date  . Anxiety   . Aortic stenosis, severe    a. suspect low flow, low gradient severe AS.   .Marland KitchenCancer (HCC)    squamous cell - right ear  . COPD (chronic obstructive pulmonary disease) ([facility] [place]   . GERD (gastroesophageal reflux disease)   . Headache   . Heart murmur   . IBD (inflammatory bowel disease)    a. s/p partial colectomy in 2009  . Incidental pulmonary nodule, > 359mand < 36m75m1/11/2016   Several small nodules in left lung  . LBBB (left bundle branch block)   . NICM (nonischemic cardiomyopathy) ([facility]  a. 08/2014: EF 15% suspect to be endstage CM 2/2 severe AS  . Pneumonia 2018  . S/P aortic valve replacement with bioprosthetic valve 10/18/2017   [facility] st[facility] bovine pericardial tissue valve  . Tobacco abuse    Current Outpatient Medications  Medication Sig Dispense Refill  . aspirin EC 325 MG EC tablet Take 1 tablet (325 mg total) by mouth daily.    . aMarland Kitchenorvastatin (LIPITOR) 20 MG tablet Take 1 tablet (20 mg total) by mouth daily at 6 PM. 30 tablet 5  . clonazePAM (KLONOPIN) 0.5 MG tablet Take 0.5 mg by mouth daily  as needed for anxiety.     . furosemide (LASIX) 20 MG tablet Take 1 tablet (20 mg total) by mouth daily. 30 tablet 5  . ivabradine (CORLANOR) 5 MG TABS tablet Take 1 tablet (5 mg total) by mouth 2 (two) times daily with a meal. 60 tablet 11  . metoprolol succinate (TOPROL-XL) 25 MG 24 hr tablet Take 12.5 mg by mouth daily.    . sacubitril-valsartan (ENTRESTO) 49-51 MG Take 1 tablet by mouth 2 (two) times daily. 60 tablet 5   No current facility-administered medications for this encounter.    No Known Allergies  Social History   Socioeconomic History  . Marital status: Single    Spouse name: Not on file  . Number of children: Not on file  . Years of  education: Not on file  . Highest education level: Not on file  Occupational History  . Not on file  Social Needs  . Financial resource strain: Not on file  . Food insecurity:    Worry: Not on file    Inability: Not on file  . Transportation needs:    Medical: Not on file    Non-medical: Not on file  Tobacco Use  . Smoking status: Former Smoker    Packs/day: 2.00    Types: Cigarettes    Start date: 1966    Last attempt to quit: 10/18/2017    Years since quitting: 0.5  . Smokeless tobacco: Former Systems developer  . Tobacco comment: as of 10/17/17 has not smoked for 26 hours  Substance and Sexual Activity  . Alcohol use: Yes    Comment: rarely  . Drug use: No  . Sexual activity: Not on file  Lifestyle  . Physical activity:    Days per week: Not on file    Minutes per session: Not on file  . Stress: Not on file  Relationships  . Social connections:    Talks on phone: Not on file    Gets together: Not on file    Attends religious service: Not on file    Active member of club or organization: Not on file    Attends meetings of clubs or organizations: Not on file    Relationship status: Not on file  . Intimate partner violence:    Fear of current or ex partner: Not on file    Emotionally abused: Not on file    Physically abused: Not on file    Forced sexual activity: Not on file  Other Topics Concern  . Not on file  Social History Narrative  . Not on file   Family History  Problem Relation Age of Onset  . Hypertension Father   . Heart attack Father   . Lung cancer Brother    Vitals:   05/02/18 1148  BP: 122/74  Pulse: 69  SpO2: 95%  Weight: 171 lb 12.8 oz (77.9 kg)   Wt Readings from Last 3 Encounters:  05/02/18 171 lb 12.8 oz (77.9 kg)  04/23/18 169 lb (76.7 kg)  02/23/18 171 lb (77.6 kg)    PHYSICAL EXAM: General:  Well appearing. No resp difficulty HEENT: normal anicteric Neck: supple. no JVD. Carotids 2+ bilat; no bruits. No lymphadenopathy or thryomegaly  appreciated. Cor: PMI nondisplaced. Regular rate & rhythm. Soft SEM over AVR Lungs: clear with decreased BS throughout. No wheeze  Abdomen: soft, nontender, nondistended. No hepatosplenomegaly. No bruits or masses. Good bowel sounds. Extremities: no cyanosis, clubbing, rash, edema Neuro: alert & oriented x 3, cranial nerves grossly  intact. moves all 4 extremities w/o difficulty. Affect pleasant  EKG: SR 67 bpm LBBB QRS 152 ms  ASSESSMENT & PLAN: 1. Chronic systolic CHF - Echo 56/31/49 LVEF 15%, RV normal, critical low gradient AS; TEE 12/18: EF 20-25%, normal RV, mild MR, trace TR -  Most recent 02/2018 ECHO EF 20-25%.Marland Kitchen He was referred to EP but he requested to wait for device.  CRT-P.or CRT-D.   - Continue lasix 20 mg daily - Continue Entresto 49/51 mg BID - Continue digoxin 0.125 mg daily.  - Continue Toprol XL 12.5 mg Daily - Add 12.5 mg spiro.  - Continue ivabradine 5 mg BID  2. COPD - PFTS 09/14/18 better than expected with tobacco use.   - DLCO reduced.  No change.   3. Low-gradient critical aortic stenosis s/p AVR 10/18/2017 -. Aware of need for dental prophylaxis.   4. Tobacco abuse Discussed smoking cessation.    5. CAD -No s/s ischemia  - Mild non-obstructive CAD with 50% proximal RCA with spasm on cath 09/06/17 - Continue atorvastatin 20 mg daily + ASA 325 mg daily  6. Pulmonary nodules.  - Scattered pulmonary nodules noted on CT 09/07/17 with repeat recommended in 3-6 months. He is due for repeat by May 2019.  -CT 03/28/2018 Improving pulmonary nodules with resolving inflammatory nodules.   7. LBBB Refer back to EP.   Refer back to Dr Lovena Le. Follow up 3 months.   Darrick Grinder, NP 05/02/18    Patient seen and examined with Darrick Grinder, NP. We discussed all aspects of the encounter. I agree with the assessment and plan as stated above.   Overall doing well. NYHA II. Volume status ok. Echo images from 4/19 reviewed with him EF ~25% with severe dyssynchrony due to  wide LBBB. Long discussion about role of CRT-P vs CRT-D. I suspect EF wiill normalize with CRT. He is now in agreement to proceed. Will refer back to Dr. Lovena Le to proceed. Encouraged smoking cessation. Agree with adding spiro. Likely can stop digoxin soon. Labs today.

## 2018-05-18 ENCOUNTER — Ambulatory Visit: Payer: Medicare HMO | Admitting: Internal Medicine

## 2018-05-18 ENCOUNTER — Encounter: Payer: Self-pay | Admitting: Internal Medicine

## 2018-05-18 VITALS — BP 118/72 | HR 64 | Ht 70.0 in | Wt 168.6 lb

## 2018-05-18 DIAGNOSIS — I447 Left bundle-branch block, unspecified: Secondary | ICD-10-CM | POA: Diagnosis not present

## 2018-05-18 DIAGNOSIS — I5022 Chronic systolic (congestive) heart failure: Secondary | ICD-10-CM

## 2018-05-18 NOTE — Progress Notes (Signed)
HPI [name] returns today for followup. He is a pleasant 67 yo man with chronic systolic heart failure, LBBB, HTN and dyslipidemia. He saw me several weeks ago and I recommended ICD insertion. He was not sure but returns today to discuss device insertion. He has a number of questions.  No Known Allergies   Current Outpatient Medications  Medication Sig Dispense Refill  . aspirin EC 325 MG EC tablet Take 1 tablet (325 mg total) by mouth daily.    Marland Kitchen atorvastatin (LIPITOR) 20 MG tablet Take 1 tablet (20 mg total) by mouth daily at 6 PM. 30 tablet 5  . clonazePAM (KLONOPIN) 0.5 MG tablet Take 0.5 mg by mouth daily as needed for anxiety.     . furosemide (LASIX) 20 MG tablet Take 1 tablet (20 mg total) by mouth daily. 30 tablet 5  . ivabradine (CORLANOR) 5 MG TABS tablet Take 1 tablet (5 mg total) by mouth 2 (two) times daily with a meal. 60 tablet 11  . metoprolol succinate (TOPROL-XL) 25 MG 24 hr tablet Take 12.5 mg by mouth daily.    . sacubitril-valsartan (ENTRESTO) 49-51 MG Take 1 tablet by mouth 2 (two) times daily. 60 tablet 5  . spironolactone (ALDACTONE) 25 MG tablet Take 0.5 tablets (12.5 mg total) by mouth daily. 15 tablet 3   No current facility-administered medications for this visit.      Past Medical History:  Diagnosis Date  . Anxiety   . Aortic stenosis, severe    a. suspect low flow, low gradient severe AS.   Marland Kitchen Cancer (HCC)    squamous cell - right ear  . COPD (chronic obstructive pulmonary disease) ([facility])   . GERD (gastroesophageal reflux disease)   . Headache   . Heart murmur   . IBD (inflammatory bowel disease)    a. s/p partial colectomy in 2009  . Incidental pulmonary nodule, > 39m and < 843m11/11/2016   Several small nodules in left lung  . LBBB (left bundle branch block)   . NIC[place] ([place]ischemic [place]) [place]   a. 08/2014: EF 15% suspect to be endstage CM 2/2 severe AS  . Pneumonia 2018  . S/P aortic valve replacement with bioprosthetic  valve 10/18/2017   [facility] st[facility] bovine pericardial [facility] valve  . Tobacco abuse     ROS:   All systems reviewed and negative except as noted in the HPI.   Past Surgical History:  Procedure Laterality Date  . AORTIC VALVE REPLACEMENT N/A 10/18/2017   Procedure: AORTIC VALVE REPLACEMENT;  Surgeon: OwRexene AlbertsMD;  [place] [facility]: Open [facility] Surgery;  Laterality: N/A;  . COLON SURGERY     bowel resection, for blockage  . HERNIA REPAIR Left    left inguinal  . IR FLUORO GUIDE CV MIDLINE PICC LEFT  09/04/2017  . RIGHT/LEFT HEART CATH AND CORONARY ANGIOGRAPHY N/A 09/06/2017   Procedure: RIGHT/LEFT HEART CATH AND CORONARY ANGIOGRAPHY;  Surgeon: BeJolaine ArtistMD;  [facility];  Service: Cardiovascular;  Laterality: N/A;  . STERNOTOMY  10/18/2017   Procedure: STERNOTOMY;  Surgeon: OwRexene AlbertsMD;  [facility];  Service: Open Heart Surgery;;  . TEE WITHOUT CARDIOVERSION N/A 10/18/2017   Procedure: TRANSESOPHAGEAL ECHOCARDIOGRAM (TEE);  Surgeon: OwRexene AlbertsMD;  [place] [facility]: [facility];  Laterality: N/A;     Family History  Problem Relation Age of Onset  . Hypertension  Father   . Heart attack Father   . Lung cancer Brother      Social History   Socioeconomic History  . Marital status: Single    Spouse name: Not on file  . Number of children: Not on file  . Years of education: Not on file  . Highest education level: Not on file  Occupational History  . Not on file  Social Needs  . Financial resource strain: Not on file  . Food insecurity:    Worry: Not on file    Inability: Not on file  . Transportation needs:    Medical: Not on file    Non-medical: Not on file  Tobacco Use  . Smoking status: Former Smoker    Packs/day: 2.00    Types: Cigarettes    Start date: 1966    Last attempt to quit: 10/18/2017    Years since quitting: 0.5  . Smokeless tobacco: Former Systems developer  .  Tobacco comment: as of 10/17/17 has not smoked for 26 hours  Substance and Sexual Activity  . Alcohol use: Yes    Comment: rarely  . Drug use: No  . Sexual activity: Not on file  Lifestyle  . Physical activity:    Days per week: Not on file    Minutes per session: Not on file  . Stress: Not on file  Relationships  . Social connections:    Talks on phone: Not on file    Gets together: Not on file    Attends religious service: Not on file    Active member of club or organization: Not on file    Attends meetings of clubs or organizations: Not on file    Relationship status: Not on file  . Intimate partner violence:    Fear of current or ex partner: Not on file    Emotionally abused: Not on file    Physically abused: Not on file    Forced sexual activity: Not on file  Other Topics Concern  . Not on file  Social History Narrative  . Not on file     BP 118/72   Pulse 64   Ht 5' 10"  (1.778 m)   Wt 168 lb 9.6 oz (76.5 kg)   BMI 24.19 kg/m   Physical Exam:  Well appearing NAD HEENT: Unremarkable Neck:  No JVD, no thyromegally Lymphatics:  No adenopathy Back:  No CVA tenderness Lungs:  Clear HEART:  Regular rate rhythm, no murmurs, no rubs, no clicks Abd:  soft, positive bowel sounds, no organomegally, no rebound, no guarding Ext:  2 plus pulses, no edema, no cyanosis, no clubbing Skin:  No rashes no nodules Neuro:  CN II through XII intact, motor grossly intact  EKG - NSR with LBBB  DEVICE  Normal device function.  See PaceArt for details.   Assess/Plan: 1. Chronic systolic heart failure - I have discussed the indications/risks/benefits/goals/expectation of BiV ICD insertion and he wishes to proceed. I will discuss this with Dr. Reine Just as to whether BiV ICD vs PPM. He will call us when he wishes to proceed.  Mikle Bosworth.D.

## 2018-05-18 NOTE — Patient Instructions (Addendum)
Medication Instructions:  Your physician recommends that you continue on your current medications as directed. Please refer to the Current Medication list given to you today.  Labwork: None ordered.  Testing/Procedures: Your physician has recommended that you have a defibrillator inserted. An implantable cardioverter defibrillator (ICD) is a small device that is placed in your chest or, in rare cases, your abdomen. This device uses electrical pulses or shocks to help control life-threatening, irregular heartbeats that could lead the heart to suddenly stop beating (sudden cardiac arrest). Leads are attached to the ICD that goes into your heart. This is done in the hospital and usually requires an overnight stay. Please see the instruction sheet given to you today for more information.   The following days are available for procedures:  July 15, 16, 18, 29 August 13, 15, 20, 22, 28 and 29 If you decide on a day please give me a call:  Bing Neighbors [phone]

## 2018-05-24 ENCOUNTER — Telehealth: Payer: Self-pay | Admitting: Internal Medicine

## 2018-05-24 DIAGNOSIS — I5022 Chronic systolic (congestive) heart failure: Secondary | ICD-10-CM

## 2018-05-24 DIAGNOSIS — I447 Left bundle-branch block, unspecified: Secondary | ICD-10-CM

## 2018-05-24 NOTE — Telephone Encounter (Signed)
Left message for Pt to return this nurse call.

## 2018-05-24 NOTE — Telephone Encounter (Signed)
New Message   Pt states he is calling to set up a procedure. Please call

## 2018-05-24 NOTE — Telephone Encounter (Signed)
Call back received from Pt.  Pt scheduled for BIV icd  Pt instruction letter/soap at front desk.  No further action needed

## 2018-05-28 ENCOUNTER — Telehealth: Payer: Self-pay | Admitting: *Deleted

## 2018-05-28 NOTE — Telephone Encounter (Signed)
Left message for patient to call back.  He is scheduled for an EP procedure 06/19/18.  Pt needs to report at 8:30 am for a 10:30 am case.  It was previously scheduled for 7:30/9:30 am.

## 2018-05-29 NOTE — Telephone Encounter (Signed)
Spoke with patient who is aware to report for his procedure at 8:30 am instead of 7:30 am as instructed previously.

## 2018-06-11 ENCOUNTER — Other Ambulatory Visit: Payer: Medicare HMO | Admitting: *Deleted

## 2018-06-11 DIAGNOSIS — I5022 Chronic systolic (congestive) heart failure: Secondary | ICD-10-CM | POA: Diagnosis not present

## 2018-06-11 DIAGNOSIS — I447 Left bundle-branch block, unspecified: Secondary | ICD-10-CM

## 2018-06-11 LAB — CBC WITH DIFFERENTIAL/PLATELET
BASOS ABS: 0 10*3/uL (ref 0.0–0.2)
BASOS: 0 %
EOS (ABSOLUTE): 0.1 10*3/uL (ref 0.0–0.4)
Eos: 1 %
Hematocrit: 48.1 % (ref 37.5–51.0)
Hemoglobin: 16.4 g/dL (ref 13.0–17.7)
Immature Grans (Abs): 0 10*3/uL (ref 0.0–0.1)
Immature Granulocytes: 0 %
LYMPHS ABS: 2 10*3/uL (ref 0.7–3.1)
Lymphs: 24 %
MCH: 31.7 pg (ref 26.6–33.0)
MCHC: 34.1 g/dL (ref 31.5–35.7)
MCV: 93 fL (ref 79–97)
Monocytes Absolute: 0.7 10*3/uL (ref 0.1–0.9)
Monocytes: 8 %
NEUTROS ABS: 5.7 10*3/uL (ref 1.4–7.0)
Neutrophils: 67 %
Platelets: 143 10*3/uL — ABNORMAL LOW (ref 150–450)
RBC: 5.17 x10E6/uL (ref 4.14–5.80)
RDW: 13.2 % (ref 12.3–15.4)
WBC: 8.5 10*3/uL (ref 3.4–10.8)

## 2018-06-11 LAB — BASIC METABOLIC PANEL
BUN / CREAT RATIO: 18 (ref 10–24)
BUN: 18 mg/dL (ref 8–27)
CHLORIDE: 102 mmol/L (ref 96–106)
CO2: 24 mmol/L (ref 20–29)
CREATININE: 1.01 mg/dL (ref 0.76–1.27)
Calcium: 9.3 mg/dL (ref 8.6–10.2)
GFR calc Af Amer: 89 mL/min/{1.73_m2} (ref >59)
GFR calc non Af Amer: 77 mL/min/{1.73_m2} (ref >59)
Glucose: 109 mg/dL — ABNORMAL HIGH (ref 65–99)
Potassium: 4.4 mmol/L (ref 3.5–5.2)
SODIUM: 139 mmol/L (ref 134–144)

## 2018-06-13 DIAGNOSIS — K4090 Unilateral inguinal hernia, without obstruction or gangrene, not specified as recurrent: Secondary | ICD-10-CM | POA: Diagnosis not present

## 2018-06-14 ENCOUNTER — Other Ambulatory Visit (HOSPITAL_COMMUNITY): Payer: Self-pay | Admitting: Pharmacist

## 2018-06-14 MED ORDER — ATORVASTATIN CALCIUM 20 MG PO TABS: 20.0000 mg | ORAL_TABLET | Freq: Every day | ORAL | 3 refills | Status: DC

## 2018-06-19 ENCOUNTER — Ambulatory Visit (HOSPITAL_COMMUNITY)
Admission: RE | Admit: 2018-06-19 | Discharge: 2018-06-20 | Disposition: A | Discharge status: Home / Self Care | Payer: Medicare HMO | Source: Ambulatory Visit | Attending: Internal Medicine | Admitting: Internal Medicine

## 2018-06-19 ENCOUNTER — Encounter (HOSPITAL_COMMUNITY): Payer: Self-pay | Admitting: Internal Medicine

## 2018-06-19 ENCOUNTER — Other Ambulatory Visit: Payer: Self-pay

## 2018-06-19 ENCOUNTER — Encounter (HOSPITAL_COMMUNITY)
Admission: RE | Disposition: A | Discharge status: Home / Self Care | Payer: Self-pay | Source: Ambulatory Visit | Attending: Internal Medicine

## 2018-06-19 DIAGNOSIS — Z87891 Personal history of nicotine dependence: Secondary | ICD-10-CM | POA: Insufficient documentation

## 2018-06-19 DIAGNOSIS — Z9889 Other specified postprocedural states: Secondary | ICD-10-CM | POA: Insufficient documentation

## 2018-06-19 DIAGNOSIS — I350 Nonrheumatic aortic (valve) stenosis: Secondary | ICD-10-CM | POA: Insufficient documentation

## 2018-06-19 DIAGNOSIS — I700 Atherosclerosis of aorta: Secondary | ICD-10-CM | POA: Insufficient documentation

## 2018-06-19 DIAGNOSIS — E785 Hyperlipidemia, unspecified: Secondary | ICD-10-CM | POA: Diagnosis not present

## 2018-06-19 DIAGNOSIS — Z7982 Long term (current) use of aspirin: Secondary | ICD-10-CM | POA: Diagnosis not present

## 2018-06-19 DIAGNOSIS — J439 Emphysema, unspecified: Secondary | ICD-10-CM | POA: Diagnosis not present

## 2018-06-19 DIAGNOSIS — Z801 Family history of malignant neoplasm of trachea, bronchus and lung: Secondary | ICD-10-CM | POA: Diagnosis not present

## 2018-06-19 DIAGNOSIS — I429 Cardiomyopathy, unspecified: Secondary | ICD-10-CM | POA: Insufficient documentation

## 2018-06-19 DIAGNOSIS — I5022 Chronic systolic (congestive) heart failure: Secondary | ICD-10-CM | POA: Diagnosis not present

## 2018-06-19 DIAGNOSIS — Z85828 Personal history of other malignant neoplasm of skin: Secondary | ICD-10-CM | POA: Diagnosis not present

## 2018-06-19 DIAGNOSIS — Z9049 Acquired absence of other specified parts of digestive tract: Secondary | ICD-10-CM | POA: Insufficient documentation

## 2018-06-19 DIAGNOSIS — Z006 Encounter for examination for normal comparison and control in clinical research program: Secondary | ICD-10-CM | POA: Diagnosis not present

## 2018-06-19 DIAGNOSIS — Z953 Presence of xenogenic heart valve: Secondary | ICD-10-CM | POA: Insufficient documentation

## 2018-06-19 DIAGNOSIS — I447 Left bundle-branch block, unspecified: Secondary | ICD-10-CM | POA: Diagnosis present

## 2018-06-19 DIAGNOSIS — K589 Irritable bowel syndrome without diarrhea: Secondary | ICD-10-CM | POA: Diagnosis not present

## 2018-06-19 DIAGNOSIS — I255 Ischemic cardiomyopathy: Secondary | ICD-10-CM | POA: Insufficient documentation

## 2018-06-19 DIAGNOSIS — Z95810 Presence of automatic (implantable) cardiac defibrillator: Secondary | ICD-10-CM

## 2018-06-19 DIAGNOSIS — Z79899 Other long term (current) drug therapy: Secondary | ICD-10-CM | POA: Diagnosis not present

## 2018-06-19 DIAGNOSIS — I110 Hypertensive heart disease with heart failure: Secondary | ICD-10-CM | POA: Insufficient documentation

## 2018-06-19 DIAGNOSIS — Z8249 Family history of ischemic heart disease and other diseases of the circulatory system: Secondary | ICD-10-CM | POA: Insufficient documentation

## 2018-06-19 DIAGNOSIS — Z955 Presence of coronary angioplasty implant and graft: Secondary | ICD-10-CM | POA: Diagnosis not present

## 2018-06-19 DIAGNOSIS — F419 Anxiety disorder, unspecified: Secondary | ICD-10-CM | POA: Diagnosis not present

## 2018-06-19 DIAGNOSIS — K219 Gastro-esophageal reflux disease without esophagitis: Secondary | ICD-10-CM | POA: Diagnosis not present

## 2018-06-19 HISTORY — PX: BIV ICD INSERTION CRT-D: EP1195

## 2018-06-19 HISTORY — DX: Reserved for concepts with insufficient information to code with codable children: IMO0002

## 2018-06-19 HISTORY — DX: Essential (primary) hypertension: I10

## 2018-06-19 HISTORY — DX: Presence of automatic (implantable) cardiac defibrillator: Z95.810

## 2018-06-19 HISTORY — DX: Pure hypercholesterolemia, unspecified: E78.00

## 2018-06-19 HISTORY — DX: Family history of other specified conditions: Z84.89

## 2018-06-19 LAB — SURGICAL PCR SCREEN
MRSA, PCR: NEGATIVE
Staphylococcus aureus: NEGATIVE

## 2018-06-19 SURGERY — BIV ICD INSERTION CRT-D

## 2018-06-19 MED ORDER — LIDOCAINE HCL (PF) 1 % IJ SOLN
INTRAMUSCULAR | Status: DC | PRN
  Administered 2018-06-19: 45 mL

## 2018-06-19 MED ORDER — ASPIRIN EC 325 MG PO TBEC
325.0000 mg | DELAYED_RELEASE_TABLET | Freq: Every day | ORAL | Status: DC
  Administered 2018-06-19 – 2018-06-20 (×2): 325 mg via ORAL
  Filled 2018-06-19 (×2): qty 1

## 2018-06-19 MED ORDER — HEPARIN (PORCINE) IN NACL 1000-0.9 UT/500ML-% IV SOLN
INTRAVENOUS | Status: AC
  Filled 2018-06-19: qty 500

## 2018-06-19 MED ORDER — CHLORHEXIDINE GLUCONATE 4 % EX LIQD
60.0000 mL | Freq: Once | CUTANEOUS | Status: DC
  Filled 2018-06-19: qty 60

## 2018-06-19 MED ORDER — SPIRONOLACTONE 12.5 MG HALF TABLET
12.5000 mg | ORAL_TABLET | Freq: Every day | ORAL | Status: DC
  Administered 2018-06-19 – 2018-06-20 (×3): 12.5 mg via ORAL
  Filled 2018-06-19 (×2): qty 1

## 2018-06-19 MED ORDER — SODIUM CHLORIDE 0.9 % IV SOLN
80.0000 mg | INTRAVENOUS | Status: AC
  Administered 2018-06-19: 80 mg
  Filled 2018-06-19: qty 2

## 2018-06-19 MED ORDER — CLONAZEPAM 0.5 MG PO TABS
0.5000 mg | ORAL_TABLET | Freq: Every evening | ORAL | Status: DC | PRN
  Administered 2018-06-19: 20:00:00 0.5 mg via ORAL
  Filled 2018-06-19: qty 1

## 2018-06-19 MED ORDER — IOPAMIDOL (ISOVUE-370) INJECTION 76%
INTRAVENOUS | Status: DC | PRN
  Administered 2018-06-19: 40 mL via INTRA_ARTERIAL
  Administered 2018-06-19: 3 mL via INTRA_ARTERIAL

## 2018-06-19 MED ORDER — SODIUM CHLORIDE 0.9 % IV SOLN
INTRAVENOUS | Status: DC
  Administered 2018-06-19: 08:00:00 via INTRAVENOUS

## 2018-06-19 MED ORDER — MIDAZOLAM HCL 5 MG/5ML IJ SOLN
INTRAMUSCULAR | Status: DC | PRN
  Administered 2018-06-19 (×5): 1 mg via INTRAVENOUS

## 2018-06-19 MED ORDER — IOPAMIDOL (ISOVUE-370) INJECTION 76%
INTRAVENOUS | Status: AC
  Filled 2018-06-19: qty 50

## 2018-06-19 MED ORDER — ATORVASTATIN CALCIUM 20 MG PO TABS
20.0000 mg | ORAL_TABLET | Freq: Every day | ORAL | Status: DC
  Administered 2018-06-19: 18:00:00 20 mg via ORAL
  Filled 2018-06-19: qty 1

## 2018-06-19 MED ORDER — CEFAZOLIN SODIUM-DEXTROSE 2-4 GM/100ML-% IV SOLN
2.0000 g | INTRAVENOUS | Status: AC
  Administered 2018-06-19: 2 g via INTRAVENOUS
  Filled 2018-06-19: qty 100

## 2018-06-19 MED ORDER — MUPIROCIN 2 % EX OINT
1.0000 | TOPICAL_OINTMENT | Freq: Once | CUTANEOUS | Status: AC
Start: 2018-06-19 — End: 2018-06-19
  Administered 2018-06-19: 1 via TOPICAL
  Filled 2018-06-19 (×2): qty 22

## 2018-06-19 MED ORDER — CEFAZOLIN SODIUM-DEXTROSE 1-4 GM/50ML-% IV SOLN
1.0000 g | Freq: Four times a day (QID) | INTRAVENOUS | Status: AC
  Administered 2018-06-19 – 2018-06-20 (×3): 1 g via INTRAVENOUS
  Filled 2018-06-19 (×4): qty 50

## 2018-06-19 MED ORDER — ONDANSETRON HCL 4 MG/2ML IJ SOLN: 4.0000 mg | Freq: Four times a day (QID) | INTRAMUSCULAR | Status: DC | PRN

## 2018-06-19 MED ORDER — METOPROLOL SUCCINATE 12.5 MG HALF TABLET
12.5000 mg | ORAL_TABLET | Freq: Every day | ORAL | Status: DC
  Administered 2018-06-20: 09:00:00 12.5 mg via ORAL
  Filled 2018-06-19: qty 1

## 2018-06-19 MED ORDER — HEPARIN (PORCINE) IN NACL 1000-0.9 UT/500ML-% IV SOLN
INTRAVENOUS | Status: DC | PRN
  Administered 2018-06-19: 500 mL

## 2018-06-19 MED ORDER — FENTANYL CITRATE (PF) 100 MCG/2ML IJ SOLN: 25.0000 ug | INTRAMUSCULAR | Status: DC | PRN

## 2018-06-19 MED ORDER — ACETAMINOPHEN 325 MG PO TABS
325.0000 mg | ORAL_TABLET | ORAL | Status: DC | PRN
  Administered 2018-06-19: 650 mg via ORAL
  Filled 2018-06-19: qty 2

## 2018-06-19 MED ORDER — MIDAZOLAM HCL 5 MG/5ML IJ SOLN
INTRAMUSCULAR | Status: AC
  Filled 2018-06-19: qty 5

## 2018-06-19 MED ORDER — IVABRADINE HCL 5 MG PO TABS
5.0000 mg | ORAL_TABLET | Freq: Two times a day (BID) | ORAL | Status: DC
  Administered 2018-06-19 – 2018-06-20 (×2): 5 mg via ORAL
  Filled 2018-06-19 (×2): qty 1

## 2018-06-19 MED ORDER — CEFAZOLIN SODIUM-DEXTROSE 2-4 GM/100ML-% IV SOLN
INTRAVENOUS | Status: AC
  Filled 2018-06-19: qty 100

## 2018-06-19 MED ORDER — SODIUM CHLORIDE 0.9 % IV SOLN
INTRAVENOUS | Status: AC
  Filled 2018-06-19: qty 2

## 2018-06-19 MED ORDER — YOU HAVE A PACEMAKER BOOK
Freq: Once | Status: AC
  Administered 2018-06-19: 15:00:00
  Filled 2018-06-19: qty 1

## 2018-06-19 MED ORDER — FENTANYL CITRATE (PF) 100 MCG/2ML IJ SOLN
INTRAMUSCULAR | Status: DC | PRN
  Administered 2018-06-19 (×5): 12.5 ug via INTRAVENOUS

## 2018-06-19 MED ORDER — FENTANYL CITRATE (PF) 100 MCG/2ML IJ SOLN
INTRAMUSCULAR | Status: AC
  Filled 2018-06-19: qty 2

## 2018-06-19 MED ORDER — SACUBITRIL-VALSARTAN 49-51 MG PO TABS
1.0000 | ORAL_TABLET | Freq: Two times a day (BID) | ORAL | Status: DC
  Administered 2018-06-19 – 2018-06-20 (×2): 1 via ORAL
  Filled 2018-06-19 (×2): qty 1

## 2018-06-19 MED ORDER — FUROSEMIDE 20 MG PO TABS
20.0000 mg | ORAL_TABLET | Freq: Every day | ORAL | Status: DC
  Administered 2018-06-19 – 2018-06-20 (×2): 20 mg via ORAL
  Filled 2018-06-19 (×2): qty 1

## 2018-06-19 SURGICAL SUPPLY — 18 items
ADAPTER SEALING SSA-EW-09 (MISCELLANEOUS) ×3 IMPLANT
BALLN ATTAIN 80 (BALLOONS) ×2
BALLN ATTAIN 80CM 6215 (BALLOONS) ×1
BALLOON ATTAIN 80 (BALLOONS) ×1 IMPLANT
CABLE SURGICAL S-101-97-12 (CABLE) ×3 IMPLANT
CATH ATTAIN COM SURV 6250V-MB2 (CATHETERS) ×3 IMPLANT
CATH JOSEPHSON QUAD-ALLRED 6FR (CATHETERS) ×3 IMPLANT
ICD CLARIA MRI DTMA1Q1 (ICD Generator) ×3 IMPLANT
LEAD ATTAIN PERFORMA S 4598-88 (Lead) ×3 IMPLANT
LEAD CAPSURE NOVUS 5076-52CM (Lead) ×3 IMPLANT
LEAD SPRINT QUAT SEC 6935-65CM (Lead) ×3 IMPLANT
PAD DEFIB LIFELINK (PAD) ×3 IMPLANT
SHEATH CLASSIC 7F (SHEATH) ×3 IMPLANT
SHEATH CLASSIC 9.5F (SHEATH) ×3 IMPLANT
SHEATH CLASSIC 9F (SHEATH) ×3 IMPLANT
SLITTER 6232ADJ (MISCELLANEOUS) ×3 IMPLANT
TRAY PACEMAKER INSERTION (PACKS) ×3 IMPLANT
WIRE ACUITY WHISPER EDS 4648 (WIRE) ×3 IMPLANT

## 2018-06-19 NOTE — H&P (Signed)
HPI Mr. Downum returns today for followup. He is a pleasant 67 yo man with chronic systolic heart failure, LBBB, HTN and dyslipidemia. He saw me several weeks ago and I recommended ICD insertion. He was not sure but returns today to discuss device insertion. He has a number of questions.  No Known Allergies         Current Outpatient Medications  Medication Sig Dispense Refill  . aspirin EC 325 MG EC tablet Take 1 tablet (325 mg total) by mouth daily.    Marland Kitchen atorvastatin (LIPITOR) 20 MG tablet Take 1 tablet (20 mg total) by mouth daily at 6 PM. 30 tablet 5  . clonazePAM (KLONOPIN) 0.5 MG tablet Take 0.5 mg by mouth daily as needed for anxiety.     . furosemide (LASIX) 20 MG tablet Take 1 tablet (20 mg total) by mouth daily. 30 tablet 5  . ivabradine (CORLANOR) 5 MG TABS tablet Take 1 tablet (5 mg total) by mouth 2 (two) times daily with a meal. 60 tablet 11  . metoprolol succinate (TOPROL-XL) 25 MG 24 hr tablet Take 12.5 mg by mouth daily.    . sacubitril-valsartan (ENTRESTO) 49-51 MG Take 1 tablet by mouth 2 (two) times daily. 60 tablet 5  . spironolactone (ALDACTONE) 25 MG tablet Take 0.5 tablets (12.5 mg total) by mouth daily. 15 tablet 3   No current facility-administered medications for this visit.          Past Medical History:  Diagnosis Date  . Anxiety   . Aortic stenosis, severe    a. suspect low flow, low gradient severe AS.   Marland Kitchen Cancer (HCC)    squamous cell - right ear  . COPD (chronic obstructive pulmonary disease) ([facility])   . GERD (gastroesophageal reflux disease)   . Headache   . Heart murmur   . IBD (inflammatory bowel disease)    a. s/p partial colectomy in 2009  . Incidental pulmonary nodule, > 51m and < 862m11/11/2016   Several small nodules in left lung  . LBBB (left bundle branch block)   . [place]mic [place]   a. 08/2014: EF 15% suspect to be endstage CM 2/2 severe AS  . Pneumonia 2018  . S/P  aortic valve replacement with bioprosthetic valve 10/18/2017   [facility] st[facility] bovine pericardial tissue valve  . Tobacco abuse     ROS:   All systems reviewed and negative except as noted in the HPI.        Past Surgical History:  Procedure Laterality Date  . AORTIC VALVE REPLACEMENT N/A 10/18/2017   Procedure: AORTIC VALVE REPLACEMENT;  Surgeon: OwRexene AlbertsMD;  [place] [facility]: [facility];  Laterality: N/A;  . COLON SURGERY     bowel resection, for blockage  . HERNIA REPAIR Left    left inguinal  . IR FLUORO GUIDE CV MIDLINE PICC LEFT  09/04/2017  . RIGHT/LEFT HEART CATH AND CORONARY ANGIOGRAPHY N/A 09/06/2017   Procedure: RIGHT/LEFT HEART CATH AND CORONARY ANGIOGRAPHY;  Surgeon: BeJolaine ArtistMD;  [facility];  Service: Cardiovascular;  Laterality: N/A;  . STERNOTOMY  10/18/2017   Procedure: STERNOTOMY;  Surgeon: OwRexene AlbertsMD;  [facility];  Service: Open [facility] Surgery;;  . TEE WITHOUT CARDIOVERSION N/A 10/18/2017   Procedure: TRANSESOPHAGEAL ECHOCARDIOGRAM (TEE);  Surgeon: OwRexene AlbertsMD;  [facility] [facility]: [facility];  Laterality:  N/A;          Family History  Problem Relation Age of Onset  . Hypertension Father   . Heart attack Father   . Lung cancer Brother      Social History        Socioeconomic History  . Marital status: Single    Spouse name: Not on file  . Number of children: Not on file  . Years of education: Not on file  . Highest education level: Not on file  Occupational History  . Not on file  Social Needs  . Financial resource strain: Not on file  . Food insecurity:    Worry: Not on file    Inability: Not on file  . Transportation needs:    Medical: Not on file    Non-medical: Not on file  Tobacco Use  . Smoking status: Former Smoker    Packs/day: 2.00    Types: Cigarettes    Start date: 1966     Last attempt to quit: 10/18/2017    Years since quitting: 0.5  . Smokeless tobacco: Former Systems developer  . Tobacco comment: as of 10/17/17 has not smoked for 26 hours  Substance and Sexual Activity  . Alcohol use: Yes    Comment: rarely  . Drug use: No  . Sexual activity: Not on file  Lifestyle  . Physical activity:    Days per week: Not on file    Minutes per session: Not on file  . Stress: Not on file  Relationships  . Social connections:    Talks on phone: Not on file    Gets together: Not on file    Attends religious service: Not on file    Active member of club or organization: Not on file    Attends meetings of clubs or organizations: Not on file    Relationship status: Not on file  . Intimate partner violence:    Fear of current or ex partner: Not on file    Emotionally abused: Not on file    Physically abused: Not on file    Forced sexual activity: Not on file  Other Topics Concern  . Not on file  Social History Narrative  . Not on file     BP 118/72   Pulse 64   Ht 5' 10"  (1.778 m)   Wt 168 lb 9.6 oz (76.5 kg)   BMI 24.19 kg/m   Physical Exam:  Well appearing NAD HEENT: Unremarkable Neck:  No JVD, no thyromegally Lymphatics:  No adenopathy Back:  No CVA tenderness Lungs:  Clear HEART:  Regular rate rhythm, no murmurs, no rubs, no clicks Abd:  soft, positive bowel sounds, no organomegally, no rebound, no guarding Ext:  2 plus pulses, no edema, no cyanosis, no clubbing Skin:  No rashes no nodules Neuro:  CN II through XII intact, motor grossly intact  EKG - NSR with LBBB  DEVICE  Normal device function.  See PaceArt for details.   Assess/Plan: 1. Chronic systolic heart failure - I have discussed the indications/risks/benefits/goals/expectation of BiV ICD insertion and he wishes to proceed. I will discuss this with Dr. Reine Just as to whether BiV ICD vs PPM. He will call us when he wishes to proceed.  Ponciano Ort.  EP  Attending  Seen and examined. Since last visit, no change. We will plan to proceed with ICD insertion as noted above.  Mikle Bosworth.D.

## 2018-06-20 ENCOUNTER — Ambulatory Visit (HOSPITAL_COMMUNITY): Payer: Medicare HMO

## 2018-06-20 DIAGNOSIS — I447 Left bundle-branch block, unspecified: Secondary | ICD-10-CM | POA: Diagnosis not present

## 2018-06-20 DIAGNOSIS — Z8249 Family history of ischemic heart disease and other diseases of the circulatory system: Secondary | ICD-10-CM | POA: Diagnosis not present

## 2018-06-20 DIAGNOSIS — I255 Ischemic cardiomyopathy: Secondary | ICD-10-CM | POA: Diagnosis not present

## 2018-06-20 DIAGNOSIS — Z87891 Personal history of nicotine dependence: Secondary | ICD-10-CM | POA: Diagnosis not present

## 2018-06-20 DIAGNOSIS — I110 Hypertensive heart disease with heart failure: Secondary | ICD-10-CM | POA: Diagnosis not present

## 2018-06-20 DIAGNOSIS — E785 Hyperlipidemia, unspecified: Secondary | ICD-10-CM | POA: Diagnosis not present

## 2018-06-20 DIAGNOSIS — I429 Cardiomyopathy, unspecified: Secondary | ICD-10-CM | POA: Diagnosis not present

## 2018-06-20 DIAGNOSIS — Z95810 Presence of automatic (implantable) cardiac defibrillator: Secondary | ICD-10-CM | POA: Diagnosis not present

## 2018-06-20 DIAGNOSIS — I5022 Chronic systolic (congestive) heart failure: Secondary | ICD-10-CM | POA: Diagnosis not present

## 2018-06-20 DIAGNOSIS — Z006 Encounter for examination for normal comparison and control in clinical research program: Secondary | ICD-10-CM | POA: Diagnosis not present

## 2018-06-20 DIAGNOSIS — J439 Emphysema, unspecified: Secondary | ICD-10-CM | POA: Diagnosis not present

## 2018-06-20 NOTE — Discharge Summary (Addendum)
ELECTROPHYSIOLOGY PROCEDURE DISCHARGE SUMMARY    Patient ID: Kenneth Mills,  MRN: [id number], DOB/AGE: 1951/08/19 67 y.o.  Admit date: 06/19/2018 Discharge date: 06/20/2018  Primary Care Physician: Camille Bal, PA-C Primary Cardiologist: [place] Electrophysiologist: Lovena Le  Primary Discharge Diagnosis:  NICM, CHF, LBBB s/p CRTD implant this admission  Secondary Discharge Diagnosis:  1.  HTN 2.  Severe AS s/p AVR 3.  COPD 4.  GERD  No Known Allergies   Procedures This Admission:  1.  Implantation of a MDT CRTD on 06/19/18 by Dr Lovena Le.  See op note for full details.  DFT's were /successful at 15 J.  There were no immediate post procedure complications. 2.  CXR on 06/20/18 demonstrated no pneumothorax status post device implantation.   Brief HPI: Kenneth Mills is a 67 y.o. male was referred to electrophysiology in the outpatient setting for consideration of ICD implantation.  Past medical history includes NICM, CHF, LBBB.  The patient has persistent LV dysfunction despite guideline directed therapy.  Risks, benefits, and alternatives to ICD implantation were reviewed with the patient who wished to proceed.   Hospital Course:  The patient was admitted and underwent implantation of a MDT CRTD with details as outlined above. He was monitored on telemetry overnight which demonstrated sinus rhythm with V pacing.  Left chest was without hematoma or ecchymosis.  The device was interrogated and found to be functioning normally.  CXR was obtained and demonstrated no pneumothorax status post device implantation.  Wound care, arm mobility, and restrictions were reviewed with the patient.  The patient was examined and considered stable for discharge to home.   The patient's discharge medications include an ACE-I (Entresto) and beta blocker (Metoprolol).   Physical Exam: Vitals:   06/19/18 1700 06/19/18 2005 06/20/18 0440 06/20/18 0717  BP: 114/69 (!) 132/55 (!) 117/58 (!)  141/75  Pulse: 73 73 68 75  Resp: (!) 23 (!) 27 (!) 22 18  Temp: 97.7 F (36.5 C) 98.4 F (36.9 C) 98.1 F (36.7 C) 98 F (36.7 C)  TempSrc: Oral Oral Oral Oral  SpO2: 95% 95% 93% 95%  Weight:   77.8 kg   Height:        GEN- The patient is well appearing, alert and oriented x 3 today.   HEENT: normocephalic, atraumatic; sclera clear, conjunctiva pink; hearing intact; oropharynx clear; neck supple  Lungs- Clear to ausculation bilaterally, normal work of breathing.  No wheezes, rales, rhonchi Heart- Regular rate and rhythm (paced) GI- soft, non-tender, non-distended, bowel sounds present  Extremities- no clubbing, cyanosis, or edema  MS- no significant deformity or atrophy Skin- warm and dry, no rash or lesion, left chest without hematoma/ecchymosis Psych- euthymic mood, full affect Neuro- strength and sensation are intact   Labs:   Lab Results  Component Value Date   WBC 8.5 06/11/2018   HGB 16.4 06/11/2018   HCT 48.1 06/11/2018   MCV 93 06/11/2018   PLT 143 (L) 06/11/2018   No results for input(s): NA, K, CL, CO2, BUN, CREATININE, CALCIUM, PROT, BILITOT, ALKPHOS, ALT, AST, GLUCOSE in the last 168 hours.  Invalid input(s): LABALBU  Discharge Medications:  Allergies as of 06/20/2018   No Known Allergies     Medication List    TAKE these medications   aspirin 325 MG EC tablet Take 1 tablet (325 mg total) by mouth daily.   atorvastatin 20 MG tablet Commonly known as:  LIPITOR Take 1 tablet (20 mg total) by mouth daily at  6 PM.   clonazePAM 0.5 MG tablet Commonly known as:  KLONOPIN Take 0.5 mg by mouth daily as needed for anxiety.   furosemide 20 MG tablet Commonly known as:  LASIX Take 1 tablet (20 mg total) by mouth daily.   ivabradine 5 MG Tabs tablet Commonly known as:  CORLANOR Take 1 tablet (5 mg total) by mouth 2 (two) times daily with a meal.   metoprolol succinate 25 MG 24 hr tablet Commonly known as:  TOPROL-XL Take 12.5 mg by mouth daily.     sacubitril-valsartan 49-51 MG Commonly known as:  ENTRESTO Take 1 tablet by mouth 2 (two) times daily.   spironolactone 25 MG tablet Commonly known as:  ALDACTONE Take 0.5 tablets (12.5 mg total) by mouth daily.       Disposition:  Discharge Instructions    Diet - low sodium heart healthy   Complete by:  As directed    Increase activity slowly   Complete by:  As directed      Follow-up Information    [facility] Follow up on 07/02/2018.   Specialty:  Cardiology Why:  at [facility] for wound check  Contact information: [address], [address] [place]       Evans Lance, MD Follow up on 09/19/2018.   Specialty:  Cardiology Why:  at 1:45PM  Contact information: [address] [address] [phone]           Duration of Discharge Encounter: Greater than 30 minutes including physician time.  Signed, Chanetta Marshall, NP 06/20/2018 8:21 AM   EP Attending  Patient seen and examined. Agree with above. He is s/p BIV ICD insertion. He is doing well. CXR looks good. Device interogation under my direction demonstrates normal function. He will be discharged home with usual followup.  Mikle Bosworth.D.

## 2018-06-20 NOTE — Discharge Instructions (Signed)
° ° °  Supplemental Discharge Instructions for  Pacemaker/Defibrillator Patients  Activity No heavy lifting or vigorous activity with your left/right arm for 6 to 8 weeks.  Do not raise your left/right arm above your head for one week.  Gradually raise your affected arm as drawn below.           __         06/24/18                   06/25/18                   06/26/18                         06/27/18  NO DRIVING for  1 week   ; you may begin driving on   3/78/58  .  WOUND CARE - Keep the wound area clean and dry.  Do not get this area wet for one week. No showers for one week; you may shower on  06/27/18   . - The tape/steri-strips on your wound will fall off; do not pull them off.  No bandage is needed on the site.  DO  NOT apply any creams, oils, or ointments to the wound area. - If you notice any drainage or discharge from the wound, any swelling or bruising at the site, or you develop a fever > 101? F after you are discharged home, call the office at once.  Special Instructions - You are still able to use cellular telephones; use the ear opposite the side where you have your pacemaker/defibrillator.  Avoid carrying your cellular phone near your device. - When traveling through airports, show security personnel your identification card to avoid being screened in the metal detectors.  Ask the security personnel to use the hand wand. - Avoid arc welding equipment, MRI testing (magnetic resonance imaging), TENS units (transcutaneous nerve stimulators).  Call the office for questions about other devices. - Avoid electrical appliances that are in poor condition or are not properly grounded. - Microwave ovens are safe to be near or to operate.  Additional information for defibrillator patients should your device go off: - If your device goes off ONCE and you feel fine afterward, notify the device clinic nurses. - If your device goes off ONCE and you do not feel well afterward, call 911. - If your  device goes off TWICE, call 911. - If your device goes off THREE times in one day, call 911.  DO NOT DRIVE YOURSELF OR A FAMILY MEMBER WITH A DEFIBRILLATOR TO THE HOSPITAL--CALL 911.

## 2018-07-02 ENCOUNTER — Ambulatory Visit (INDEPENDENT_AMBULATORY_CARE_PROVIDER_SITE_OTHER): Payer: Medicare HMO | Admitting: *Deleted

## 2018-07-02 DIAGNOSIS — I447 Left bundle-branch block, unspecified: Secondary | ICD-10-CM

## 2018-07-02 DIAGNOSIS — I5022 Chronic systolic (congestive) heart failure: Secondary | ICD-10-CM

## 2018-07-02 DIAGNOSIS — I428 Other cardiomyopathies: Secondary | ICD-10-CM | POA: Diagnosis not present

## 2018-07-02 LAB — CUP PACEART INCLINIC DEVICE CHECK
Brady Statistic AP VS Percent: 0.01 %
Brady Statistic AS VP Percent: 99.26 %
HighPow Impedance: 68 Ohm
Implantable Lead Implant Date: 20190813
Implantable Lead Location: 753859
Implantable Lead Location: 753860
Implantable Lead Model: 5076
Implantable Lead Model: 6935
Implantable Pulse Generator Implant Date: 20190813
Lead Channel Impedance Value: 172.541
Lead Channel Impedance Value: 399 Ohm
Lead Channel Impedance Value: 418 Ohm
Lead Channel Impedance Value: 418 Ohm
Lead Channel Impedance Value: 418 Ohm
Lead Channel Impedance Value: 589 Ohm
Lead Channel Impedance Value: 608 Ohm
Lead Channel Impedance Value: 703 Ohm
Lead Channel Pacing Threshold Amplitude: 0.625 V
Lead Channel Pacing Threshold Amplitude: 1.375 V
Lead Channel Pacing Threshold Pulse Width: 0.4 ms
Lead Channel Pacing Threshold Pulse Width: 0.4 ms
Lead Channel Sensing Intrinsic Amplitude: 1.75 mV
Lead Channel Sensing Intrinsic Amplitude: 11.875 mV
Lead Channel Sensing Intrinsic Amplitude: 2 mV
Lead Channel Sensing Intrinsic Amplitude: 7.25 mV
Lead Channel Setting Pacing Amplitude: 3 V
Lead Channel Setting Pacing Amplitude: 3.5 V
Lead Channel Setting Pacing Pulse Width: 0.4 ms
MDC IDC LEAD IMPLANT DT: 20190813
MDC IDC LEAD IMPLANT DT: 20190813
MDC IDC LEAD LOCATION: 753858
MDC IDC MSMT BATTERY REMAINING LONGEVITY: 79 mo
MDC IDC MSMT BATTERY VOLTAGE: 3.01 V
MDC IDC MSMT LEADCHNL LV IMPEDANCE VALUE: 176 Ohm
MDC IDC MSMT LEADCHNL LV IMPEDANCE VALUE: 176 Ohm
MDC IDC MSMT LEADCHNL LV IMPEDANCE VALUE: 204.14 Ohm
MDC IDC MSMT LEADCHNL LV IMPEDANCE VALUE: 209 Ohm
MDC IDC MSMT LEADCHNL LV IMPEDANCE VALUE: 304 Ohm
MDC IDC MSMT LEADCHNL LV IMPEDANCE VALUE: 513 Ohm
MDC IDC MSMT LEADCHNL LV IMPEDANCE VALUE: 646 Ohm
MDC IDC MSMT LEADCHNL LV IMPEDANCE VALUE: 703 Ohm
MDC IDC MSMT LEADCHNL RA IMPEDANCE VALUE: 456 Ohm
MDC IDC MSMT LEADCHNL RA PACING THRESHOLD AMPLITUDE: 0.75 V
MDC IDC MSMT LEADCHNL RA PACING THRESHOLD PULSEWIDTH: 0.4 ms
MDC IDC MSMT LEADCHNL RV IMPEDANCE VALUE: 475 Ohm
MDC IDC SESS DTM: 20190826145841
MDC IDC SET LEADCHNL RA PACING AMPLITUDE: 3.5 V
MDC IDC SET LEADCHNL RV PACING PULSEWIDTH: 0.4 ms
MDC IDC SET LEADCHNL RV SENSING SENSITIVITY: 0.3 mV
MDC IDC STAT BRADY AP VP PERCENT: 0.7 %
MDC IDC STAT BRADY AS VS PERCENT: 0.03 %
MDC IDC STAT BRADY RA PERCENT PACED: 0.71 %
MDC IDC STAT BRADY RV PERCENT PACED: 99.93 %

## 2018-07-02 NOTE — Progress Notes (Signed)
Wound check appointment. Steri-strips removed. Wound without redness or edema. Incision edges approximated, wound well healed. Normal device function. Thresholds, sensing, and impedances consistent with implant measurements. Device programmed at 3.5V (RA/RV) and 3.0V(LV) for extra safety margin until 3 month visit. Histogram distribution appropriate for patient and level of activity. No mode switches. (1) VT-NS episode noted x 6bts. Patient educated about wound care, arm mobility, lifting restrictions, shock plan. ROV in 3 months with GT.

## 2018-07-20 DIAGNOSIS — K4090 Unilateral inguinal hernia, without obstruction or gangrene, not specified as recurrent: Secondary | ICD-10-CM | POA: Diagnosis not present

## 2018-07-24 ENCOUNTER — Ambulatory Visit (HOSPITAL_COMMUNITY)
Admission: RE | Admit: 2018-07-24 | Discharge: 2018-07-24 | Disposition: A | Discharge status: Home / Self Care | Payer: Medicare HMO | Source: Ambulatory Visit | Attending: Internal Medicine | Admitting: Internal Medicine

## 2018-07-24 ENCOUNTER — Encounter (HOSPITAL_COMMUNITY): Payer: Self-pay | Admitting: Internal Medicine

## 2018-07-24 VITALS — BP 158/80 | HR 59 | Wt 174.8 lb

## 2018-07-24 DIAGNOSIS — F17210 Nicotine dependence, cigarettes, uncomplicated: Secondary | ICD-10-CM | POA: Diagnosis not present

## 2018-07-24 DIAGNOSIS — Z953 Presence of xenogenic heart valve: Secondary | ICD-10-CM | POA: Diagnosis not present

## 2018-07-24 DIAGNOSIS — Z95810 Presence of automatic (implantable) cardiac defibrillator: Secondary | ICD-10-CM | POA: Diagnosis not present

## 2018-07-24 DIAGNOSIS — Z7982 Long term (current) use of aspirin: Secondary | ICD-10-CM | POA: Diagnosis not present

## 2018-07-24 DIAGNOSIS — R911 Solitary pulmonary nodule: Secondary | ICD-10-CM | POA: Diagnosis not present

## 2018-07-24 DIAGNOSIS — Z9049 Acquired absence of other specified parts of digestive tract: Secondary | ICD-10-CM | POA: Diagnosis not present

## 2018-07-24 DIAGNOSIS — Z8249 Family history of ischemic heart disease and other diseases of the circulatory system: Secondary | ICD-10-CM | POA: Diagnosis not present

## 2018-07-24 DIAGNOSIS — I5022 Chronic systolic (congestive) heart failure: Secondary | ICD-10-CM | POA: Insufficient documentation

## 2018-07-24 DIAGNOSIS — E7800 Pure hypercholesterolemia, unspecified: Secondary | ICD-10-CM | POA: Diagnosis not present

## 2018-07-24 DIAGNOSIS — Z801 Family history of malignant neoplasm of trachea, bronchus and lung: Secondary | ICD-10-CM | POA: Insufficient documentation

## 2018-07-24 DIAGNOSIS — I429 Cardiomyopathy, unspecified: Secondary | ICD-10-CM | POA: Diagnosis not present

## 2018-07-24 DIAGNOSIS — Z9889 Other specified postprocedural states: Secondary | ICD-10-CM | POA: Insufficient documentation

## 2018-07-24 DIAGNOSIS — I2510 Atherosclerotic heart disease of native coronary artery without angina pectoris: Secondary | ICD-10-CM | POA: Insufficient documentation

## 2018-07-24 DIAGNOSIS — I447 Left bundle-branch block, unspecified: Secondary | ICD-10-CM | POA: Diagnosis not present

## 2018-07-24 DIAGNOSIS — I350 Nonrheumatic aortic (valve) stenosis: Secondary | ICD-10-CM | POA: Diagnosis not present

## 2018-07-24 DIAGNOSIS — K589 Irritable bowel syndrome without diarrhea: Secondary | ICD-10-CM | POA: Diagnosis not present

## 2018-07-24 DIAGNOSIS — Z09 Encounter for follow-up examination after completed treatment for conditions other than malignant neoplasm: Secondary | ICD-10-CM | POA: Diagnosis not present

## 2018-07-24 DIAGNOSIS — I110 Hypertensive heart disease with heart failure: Secondary | ICD-10-CM | POA: Diagnosis not present

## 2018-07-24 DIAGNOSIS — F419 Anxiety disorder, unspecified: Secondary | ICD-10-CM | POA: Insufficient documentation

## 2018-07-24 DIAGNOSIS — Z720 Tobacco use: Secondary | ICD-10-CM

## 2018-07-24 DIAGNOSIS — J449 Chronic obstructive pulmonary disease, unspecified: Secondary | ICD-10-CM | POA: Insufficient documentation

## 2018-07-24 DIAGNOSIS — K4090 Unilateral inguinal hernia, without obstruction or gangrene, not specified as recurrent: Secondary | ICD-10-CM | POA: Insufficient documentation

## 2018-07-24 DIAGNOSIS — Z79899 Other long term (current) drug therapy: Secondary | ICD-10-CM | POA: Diagnosis not present

## 2018-07-24 MED ORDER — SACUBITRIL-VALSARTAN 49-51 MG PO TABS: 1.0000 | ORAL_TABLET | Freq: Two times a day (BID) | ORAL | 6 refills | Status: DC

## 2018-07-24 MED ORDER — FUROSEMIDE 20 MG PO TABS: 20.0000 mg | ORAL_TABLET | ORAL | 5 refills | Status: DC | PRN

## 2018-07-24 MED ORDER — ASPIRIN EC 81 MG PO TBEC: 81.0000 mg | DELAYED_RELEASE_TABLET | Freq: Every day | ORAL | Status: DC

## 2018-07-24 NOTE — Addendum Note (Signed)
Encounter addended by: Scarlette Calico, RN on: 07/24/2018 2:30 PM  Actions taken: Order list changed, Sign clinical note

## 2018-07-24 NOTE — Patient Instructions (Signed)
Stop Corlanor  Decrease Aspirin to 81 mg daily  Restart Entresto 49/51 mg Twice daily   Take Furosemide only AS NEEDED  Your physician recommends that you schedule a follow-up appointment in: 3-4 months

## 2018-07-24 NOTE — Progress Notes (Signed)
Advanced Heart Failure Clinic Note   Primary Cardiologist: Dr. Haroldine Laws  EP: Dr Lovena Le  HPI: Kenneth Mills is a 67 y.o. male with h/o COPD, IBD s/p partial colectomy, Systolic CHF, and severe aortic stenosis s/p AVR 10/2017.  Admitted 09/01/17 with acute respiratory failure with parainfluenza and + PCT requiring intubation. Echo showed markedly depressed LV dysfunction and critical low-grade AS (new findings).   Cath showed non-obstructive disease, with marginal cardiac output, and severe AS.    TAVR team consulted and work up began. Pt had cardiac CTs and Carotid dopplers inpatient. PFTs and outpatient surgery follow up arranged.   Pt s/p AVR 10/18/2017. Required milrinone transiently that admission. Meds titrated as tolerated.   Referred to EP 02/22/2018 with Dr Lovena Le.  Underwent CRT-D implant 06/19/2018.  He presents today for regular HF follow up. Last visit, spiro was added. Says he feels OK. Recently got an [place] so walks with him frequently. Denies CP or SOB. Walked 3/4 mile yesterday without problem. Ran out of Entresto on 9/5 and lasix on 9/12. No edema, orthopnea or PND. Still smoking a few cigarettes per day. Has seen Dr. Marlou Starks in [place] for R inguinal hernia.    Review of systems complete and found to be negative unless listed in HPI.   PFTs 09/14/17 FVC-pre 4.13     (91%) FVC-post 3.85   (84%) FEV1-pre 2.53   (75%) FEV1-post 2.46 (72%) TLC 6.19 (88%) DLCO 17.26 (53%) (uncorrected)  ECHO 02/05/2018 EF 20-25%  Echo 09/02/17: EF 15% diffuse HK Normal RV. Critical low-gradient AS  RHC/LHC 09/05/2017  Prox RCA lesion, 50 %stenosed.  Mid RCA lesion, 30 %stenosed.  Mid LAD lesion, 30 %stenosed.  Mid Cx to Dist Cx lesion, 20 %stenosed.  Findings:  Ao = 96/67 (81) LV 140/19 RA = 4 RV = 31/7 PA = 28/13 (21) PCW = 14 Fick cardiac output/index =3.7/1.9 PVR = 1.9 WU Ao sat = 97% PA sat = 69%, 65%  Aortic valve Peak gradient 52mHG Mean  gradient 24 mmHG  Past Medical History:  Diagnosis Date  . AICD (automatic cardioverter/defibrillator) present 06/19/2018  . Anxiety   . Aortic stenosis, severe    a. suspect low flow, low gradient severe AS.   .Marland KitchenCOPD (chronic obstructive pulmonary disease) ([place]   . Family history of adverse reaction to anesthesia    /son "passed out multiple times after anesthesia"  . GERD (gastroesophageal reflux disease)   . Heart murmur    "not since valve was replaced" (06/19/2018)  . High cholesterol   . Hypertension   . IBD (inflammatory bowel disease)    a. s/p partial colectomy in 2009  . Incidental pulmonary nodule, > 376mand < 57m22m1/11/2016   Several small nodules in left lung  . LBBB (left bundle branch block)   . NICM ([place]ischemic cardiomyopathy) ([place]  a. 08/2014: EF 15% suspect to be endstage CM 2/2 severe AS  . Pneumonia 08/2017  . S/P aortic valve replacement with bioprosthetic valve 10/18/2017   [facility]  . Squamous carcinoma    ight ear  . Tobacco abuse    Current Outpatient Medications  Medication Sig Dispense Refill  . aspirin EC 325 MG EC tablet Take 1 tablet (325 mg total) by mouth daily.    . aMarland Kitchenorvastatin (LIPITOR) 20 MG tablet Take 1 tablet (20 mg total) by mouth daily at 6 PM. 90 tablet 3  . clonazePAM (KLONOPIN) 0.5 MG  tablet Take 0.5 mg by mouth daily as needed for anxiety.     . furosemide (LASIX) 20 MG tablet Take 1 tablet (20 mg total) by mouth daily. 30 tablet 5  . ivabradine (CORLANOR) 5 MG TABS tablet Take 1 tablet (5 mg total) by mouth 2 (two) times daily with a meal. 60 tablet 11  . metoprolol succinate (TOPROL-XL) 25 MG 24 hr tablet Take 12.5 mg by mouth daily.    . sacubitril-valsartan (ENTRESTO) 49-51 MG Take 1 tablet by mouth 2 (two) times daily. 60 tablet 5  . spironolactone (ALDACTONE) 25 MG tablet Take 0.5 tablets (12.5 mg total) by mouth daily. 15 tablet 3   No current facility-administered  medications for this visit.    No Known Allergies  Social History   Socioeconomic History  . Marital status: Divorced    Spouse name: Not on file  . Number of children: Not on file  . Years of education: Not on file  . Highest education level: Not on file  Occupational History  . Not on file  Social Needs  . Financial resource strain: Not on file  . Food insecurity:    Worry: Not on file    Inability: Not on file  . Transportation needs:    Medical: Not on file    Non-medical: Not on file  Tobacco Use  . Smoking status: Current Some Day Smoker    Packs/day: 1.00    Years: 53.00    Pack years: 53.00    Types: Cigarettes    Start date: 73  . Smokeless tobacco: Former Systems developer    Types: Chew  Substance and Sexual Activity  . Alcohol use: Yes    Comment: 06/19/2018 "couple beers/year"  . Drug use: Not Currently    Types: Marijuana  . Sexual activity: Not on file  Lifestyle  . Physical activity:    Days per week: Not on file    Minutes per session: Not on file  . Stress: Not on file  Relationships  . Social connections:    Talks on phone: Not on file    Gets together: Not on file    Attends religious service: Not on file    Active member of club or organization: Not on file    Attends meetings of clubs or organizations: Not on file    Relationship status: Not on file  . Intimate partner violence:    Fear of current or ex partner: Not on file    Emotionally abused: Not on file    Physically abused: Not on file    Forced sexual activity: Not on file  Other Topics Concern  . Not on file  Social History Narrative  . Not on file   Family History  Problem Relation Age of Onset  . Hypertension Father   . Heart attack Father   . Lung cancer Brother    Vitals:   07/24/18 1406  BP: (!) 158/80  Pulse: (!) 59  SpO2: 93%  Weight: 79.3 kg (174 lb 12.8 oz)   Wt Readings from Last 3 Encounters:  06/20/18 77.8 kg (171 lb 8.3 oz)  05/18/18 76.5 kg (168 lb 9.6 oz)    05/02/18 77.9 kg (171 lb 12.8 oz)    PHYSICAL EXAM: General:  Well appearing. No resp difficulty HEENT: normal Neck: supple. no JVD. Carotids 2+ bilat; no bruits. No lymphadenopathy or thryomegaly appreciated. Cor: PMI nondisplaced. Regular rate & rhythm. No rubs, gallops or murmurs. Lungs: clear with decreased BS throughout.  Abdomen: soft, nontender, nondistended. No hepatosplenomegaly. No bruits or masses. Good bowel sounds. Extremities: no cyanosis, clubbing, rash, edema Neuro: alert & orientedx3, cranial nerves grossly intact. moves all 4 extremities w/o difficulty. Affect pleasant    ASSESSMENT & PLAN: 1. Chronic systolic CHF - Echo 77/41/28 LVEF 15%, RV normal, critical low gradient AS; TEE 12/18: EF 20-25%, normal RV, mild MR, trace TR - Most recent 02/2018 ECHO EF 20-25%. Now s/p CRT-D 06/19/18. - Volume status looks good despite being off of Entresto and lasix - Change lasix to 7m prn only - Restart Entresto 49/51 mg BID - Continue digoxin 0.125 mg daily.  - Continue Toprol XL 12.5 mg Daily - Continue 12.5 mg spiro.  - Stop ivabradine - Repeat echo in 3-4 months after CRT to reassess   2. COPD - PFTS 09/14/18 better than expected with tobacco use.   - DLCO reduced. No change.   3. Low-gradient critical aortic stenosis s/p bioprosthetic AVR 10/18/2017 - Aware of need for dental prophylaxis.   4. Tobacco abuse - Reinforced need to stop smoking completely  5. CAD - No s/s ischemia. - Mild non-obstructive CAD with 50% proximal RCA with spasm on cath 09/06/17 - Continue atorvastatin 20 mg daily + ASA 81 mg daily  6. Pulmonary nodules.  - Scattered pulmonary nodules noted on CT 09/07/17.  -CT 03/28/2018 Improving pulmonary nodules with resolving inflammatory nodules.   7. LBBB - S/p CRT-D 06/19/2018  8. R inguinal hernia - [place]  - He is low to moderate risk for peri-op CV complications ok to proceed with surgery.   DGlori Bickers MD   2:15 PM

## 2018-08-01 ENCOUNTER — Telehealth (HOSPITAL_COMMUNITY): Payer: Self-pay | Admitting: *Deleted

## 2018-08-01 NOTE — Telephone Encounter (Signed)
Received note from [facility], pt needs clearance for hernia repair under general anesthesia and approval to hold ASA.    Per Dr Haroldine Laws:  "Patient at moderate risk for peri-op CV complications.  Ok to proceed.  Ok to hold ASA as needed."  Note faxed back to them at [phone], atten:  Carlene Coria, [place]

## 2018-08-08 ENCOUNTER — Ambulatory Visit: Payer: Self-pay | Admitting: General Surgery

## 2018-08-14 NOTE — Progress Notes (Addendum)
Anesthesia Note:   Case:  [id number] Date/Time:  08/27/18 1015   Procedures:      RIGHT INGUINAL HERNIA REPAIR WITH MESH (Right )     INSERTION OF MESH (Right )   Anesthesia type:  General   Pre-op diagnosis:  RIGHT INGUINAL HERNIA   Location:  [facility] OR ROOM 09 / [place] OR   Surgeon:  Jovita Kussmaul, MD      DISCUSSION: Patient is a 67 year old male scheduled for the above procedure.   History includes smoking (~ 3/4 PPD), COPD, severe AS (s/p AVR [employer] Magna Ease Pericardial Tissue Valve, 25 mm 10/18/17), non-ischemic cardiomyopathy (non-obstructive CAD 41/2878), chronic systolic CHF (EF 67-67% 12/945), AICD (s/p [employer] BiV ICD CRT-D 06/19/18), HTN, left BBB, hypercholesterolemia, GERD, pulmonary nodules (inflammatory, resolving 03/2018 CT), inflammatory bowel disease (s/p partial colectomy '09).   I had intended to evaluate patient at PAT, but his PAT RN was not aware of my note (that I started on 08/14/18), and he was allowed to leave after labs. I called patient instead, but I also offered for him to come back in and see me if desired. He reported that he is doing well since his last visit with Dr. Haroldine Laws 07/24/18. He denied chest pain, dizziness, syncope, edema, SOB at rest, PND, orthopnea, ICD discharges. He can lie flat, sleeps with one pillow. He reports he would have to do more strenuous activities such as running to cause and significant exertional dyspnea, although he admits that he has not been able to be very active of the last 2-3 months due to his hernia. He is wearing a support belt. Prior to his symptomatic hernia, he said he could do yard work, walk his dog, and walk up a flight of stairs without difficulty (although prior to 10/2017 AVR he had worsening exertional dyspnea and a gradual decline in exercise tolerance). He states he is compliant with his medications. He does continue to smoke, but reported last URI/COPD exacerbation last year. He feels at his usual baseline. He denied any  known anesthesia complications. Reports ICD located on left chest.   Per Dr Haroldine Laws:  "Patient at moderate risk for peri-op CV complications.  Ok to proceed.  Ok to hold ASA as needed." (Patient was not completely sure about perioperative ASA instructions. I spoke with [facility] triage nurse. Per their records, Dr. Haroldine Laws said patient could hold ASA for three days preoperatively. [facility] staff will confirm with Dr. Marlou Starks and patient.)  Patient currently without any acute cardiopulmonary symptoms. No ICD discharges. Discussed with patient that we would want him at baseline for elective surgery. If no acute changes then I would anticipate that he can proceed as planned. [employer] Reps notified of surgery date and time. Perioperative magnet recommended.   VS: BP 140/64   Pulse 60   Temp 36.5 C   Resp 20   Ht 5' 10"  (1.778 m)   Wt 79.4 kg   SpO2 97%   BMI 25.12 kg/m   PROVIDERS: Camille Bal, PA-C is PCP. Glori Bickers, MD is primary/HF cardiologist. Last visit 07/24/18.  Cristopher Peru, MD is EP cardiologist. Last device check 07/02/18. He was not pacer dependent at that time. Darylene Price, MD is CT surgeon.  LABS: Preoperative labs noted. PLT 110K (down from 143K 06/11/18). Reported no ETOH use since this past Spring. (all labs ordered are listed, but only abnormal results are displayed)  Labs Reviewed  BASIC METABOLIC PANEL - Abnormal; Notable for the following components:  Result Value   Glucose, Bld 137 (*)    All other components within normal limits  CBC - Abnormal; Notable for the following components:   Platelets 110 (*)    All other components within normal limits    PFTs 09/14/17 FVC-pre 4.16     (91%) FVC-post 3.85   (84%) FEV1-pre 2.53   (75%) FEV1-post 2.46 (72%) TLC 6.19 (88%) DLCO 17.26 (53%) (uncorrected)   IMAGES: CXR 06/20/18: IMPRESSION: Pacemaker leads as described. No pneumothorax. There is a degree of underlying emphysematous change, better  appreciated on recent CT. There is aortic atherosclerosis. Status post aortic valve replacement. Bibasilar atelectasis and scarring without edema or Consolidation.  CT Chest 03/28/18: IMPRESSION: - Improving bilateral pulmonary nodules compatible with resolving inflammatory nodules. No new or enlarging pulmonary nodules. - Aortic Atherosclerosis (ICD10-I70.0) and Emphysema (ICD10-J43.9).   EKG: 06/20/18: Atrial-sensed ventricular paced rhythm. No significant change since last tracing. Deep T waves in precordial leads.    CV: ECHO 02/05/2018: Study Conclusions - Left ventricle: LVEF is severely depressed at approximately 20 to   25% with akinesis of the septal, inferior and apical walls;   hypookinesis of the distal lateral, mid/distal anteiorr walls.   The cavity size was mildly dilated. Wall thickness was increased   in a pattern of mild LVH. - Aortic valve: AV prosthesis is difficult to see Peak and mean   gradients through the valve are 22 and 12 mm Hg respectively. - Mitral valve: There was mild regurgitation. (Comparison EF: 15-20% 10/09/17, 15% with diffuse hypokinesis, severe AS 09/02/17)  RHC/LHC 09/05/2017  Prox RCA lesion, 50 %stenosed.  Mid RCA lesion, 30 %stenosed.  Mid LAD lesion, 30 %stenosed.  Mid Cx to Dist Cx lesion, 20 %stenosed. Findings: Ao = 96/67 (81) LV 140/19 RA = 4 RV = 31/7 PA = 28/13 (21) PCW = 14 Fick cardiac output/index =3.7/1.9 PVR = 1.9 WU Ao sat = 97% PA sat = 69%, 65% - Aortic valve Peak gradient 76mHG Mean gradient 24 mmHG Assessment: 1. Mild non-obstructive CAD with 50% proximal RCA with spasm 2. Severe NICM with EF 10-15% 3. Moderately reduced cardiac output 4. Likely low-gradient severe AS - Refer for TAVR/AVR after recovers from parainfluenzae infection. (S/p AVR   Carotid U/S 09/07/17: Final Interpretation: - Right Carotid: There is no evicence of stenosis in the right ICA. - Left Carotid: There is evidence in the left  ICA of a 1-39% stenosis. - Vertebrals: No evidence of flow detected in the left vertebral artery. Intact      antegrade flow in right vertebral artery.   Past Medical History:  Diagnosis Date  . AICD (automatic cardioverter/defibrillator) present 06/19/2018  . Anxiety   . Aortic stenosis, severe    a. suspect low flow, low gradient severe AS.   .Marland KitchenArthritis   . Chronic kidney disease   . COPD (chronic obstructive pulmonary disease) ([place]   . Coronary artery disease   . Family history of adverse reaction to anesthesia    /son "passed out multiple times after anesthesia"  . Frequency of urination   . Heart murmur    "not since valve was replaced" (06/19/2018)  . High cholesterol   . Hypertension   . IBD (inflammatory bowel disease)    a. s/p partial colectomy in 2009  . Incidental pulmonary nodule, > 354mand < 58m61m1/11/2016   Several small nodules in left lung  . LBBB (left bundle branch block)   . NICM (nonischemic cardiomyopathy) ([place]  a. 08/2014: EF 15% suspect to be endstage CM 2/2 severe AS  . Pneumonia 08/2017  . S/P aortic valve replacement with bioprosthetic valve 10/18/2017   25 mm [facility] Ease stented bovine pericardial tissue valve  . Squamous carcinoma    ight ear  . Tobacco abuse     Past Surgical History:  Procedure Laterality Date  . AORTIC VALVE REPLACEMENT N/A 10/18/2017   Procedure: AORTIC VALVE REPLACEMENT;  Surgeon: Rexene Alberts, MD;  Location: [facility];  Service: Open Heart Surgery;  Laterality: N/A;  . BIV ICD INSERTION CRT-D N/A 06/19/2018   Procedure: BIV ICD INSERTION CRT-D;  Surgeon: Evans Lance, MD;  Location: [facility] CV LAB;  Service: Cardiovascular;  Laterality: N/A;  . CARDIAC VALVE REPLACEMENT    . COLECTOMY  2009   bowel resection, for blockage  . INGUINAL HERNIA REPAIR Left 1965  . IR FLUORO GUIDE CV MIDLINE PICC LEFT  09/04/2017  . RIGHT/LEFT HEART CATH AND CORONARY ANGIOGRAPHY N/A 09/06/2017   Procedure:  RIGHT/LEFT HEART CATH AND CORONARY ANGIOGRAPHY;  Surgeon: Jolaine Artist, MD;  Location: [place] CV LAB;  Service: Cardiovascular;  Laterality: N/A;  . SQUAMOUS CELL CARCINOMA EXCISION Right    "ear"  . STERNOTOMY  10/18/2017   Procedure: STERNOTOMY;  Surgeon: Rexene Alberts, MD;  [facility] OR;  Service: Open Heart Surgery;;  . TEE WITHOUT CARDIOVERSION N/A 10/18/2017   Procedure: TRANSESOPHAGEAL ECHOCARDIOGRAM (TEE);  Surgeon: Rexene Alberts, MD;  Location: [facility];  Service: Open Heart Surgery;  Laterality: N/A;    MEDICATIONS: . aspirin 81 MG tablet  . atorvastatin (LIPITOR) 20 MG tablet  . clonazePAM (KLONOPIN) 0.5 MG tablet  . furosemide (LASIX) 20 MG tablet  . metoprolol succinate (TOPROL-XL) 25 MG 24 hr tablet  . Multiple Vitamins-Minerals (MULTIVITAMIN ADULT) CHEW  . sacubitril-valsartan (ENTRESTO) 49-51 MG  . spironolactone (ALDACTONE) 25 MG tablet   No current facility-administered medications for this encounter.     George Hugh [facility] Short Stay Center/Anesthesiology Phone [phone] 08/21/2018 1:53 PM

## 2018-08-16 NOTE — Pre-Procedure Instructions (Signed)
JAIDEEP POLLACK  08/16/2018       Your procedure is scheduled on August 27, 2018.  Report to [facility] Admitting at 08:30 A.M.  Call this number if you have problems the morning of surgery:  [phone]   Remember:  Do not eat after midnight.  You may drink clear liquids until 07:30am .  Clear liquids allowed are:            Water, Carbonated beverages, Clear Tea, Black Coffee only, Plain Jell-O only and Gatorade    Take these medicines the morning of surgery with A SIP OF WATER : Clonazepam (Klonopin) if needed Metoprolol (Toprol-XL)    Do not wear jewelry.  Do not wear lotions, powders, or cologne, or deodorant.  Do not shave 48 hours prior to surgery.  Men may shave face and neck.  Do not bring valuables to the hospital.  [facility] is not responsible for any belongings or valuables.  Contacts, dentures or bridgework may not be worn into surgery.  Leave your suitcase in the car.  After surgery it may be brought to your room.  For patients admitted to the hospital, discharge time will be determined by your treatment team.  Patients discharged the day of surgery will not be allowed to drive home.   Special instructions:   [place]- Preparing For Surgery  Before surgery, you can play an important role. Because skin is not sterile, your skin needs to be as free of germs as possible. You can reduce the number of germs on your skin by washing with CHG (chlorahexidine gluconate) Soap before surgery.  CHG is an antiseptic cleaner which kills germs and bonds with the skin to continue killing germs even after washing.    Oral Hygiene is also important to reduce your risk of infection.  Remember - BRUSH YOUR TEETH THE MORNING OF SURGERY WITH YOUR REGULAR TOOTHPASTE  Please do not use if you have an allergy to CHG or antibacterial soaps. If your skin becomes reddened/irritated stop using the CHG.  Do not shave (including legs and underarms) for at least 48 hours  prior to first CHG shower. It is OK to shave your face.  Please follow these instructions carefully.   1. Shower the NIGHT BEFORE SURGERY and the MORNING OF SURGERY with CHG.   2. If you chose to wash your hair, wash your hair first as usual with your normal shampoo.  3. After you shampoo, rinse your hair and body thoroughly to remove the shampoo.  4. Use CHG as you would any other liquid soap. You can apply CHG directly to the skin and wash gently with a scrungie or a clean washcloth.   5. Apply the CHG Soap to your body ONLY FROM THE NECK DOWN.  Do not use on open wounds or open sores. Avoid contact with your eyes, ears, mouth and genitals (private parts). Wash Face and genitals (private parts)  with your normal soap.  6. Wash thoroughly, paying special attention to the area where your surgery will be performed.  7. Thoroughly rinse your body with warm water from the neck down.  8. DO NOT shower/wash with your normal soap after using and rinsing off the CHG Soap.  9. Pat yourself dry with a CLEAN TOWEL.  10. Wear CLEAN PAJAMAS to bed the night before surgery, wear comfortable clothes the morning of surgery  11. Place CLEAN SHEETS on your bed the night of your first shower and DO  NOT SLEEP WITH PETS.    Day of Surgery:  Do not apply any deodorants/lotions.  Please wear clean clothes to the hospital/surgery center.   Remember to brush your teeth WITH YOUR REGULAR TOOTHPASTE.    Please read over the following fact sheets that you were given.

## 2018-08-20 ENCOUNTER — Encounter (HOSPITAL_COMMUNITY): Payer: Self-pay

## 2018-08-20 ENCOUNTER — Other Ambulatory Visit: Payer: Self-pay

## 2018-08-20 ENCOUNTER — Encounter (HOSPITAL_COMMUNITY)
Admission: RE | Admit: 2018-08-20 | Discharge: 2018-08-20 | Disposition: A | Discharge status: Home / Self Care | Payer: Medicare HMO | Source: Ambulatory Visit | Attending: General Surgery | Admitting: General Surgery

## 2018-08-20 DIAGNOSIS — Z01812 Encounter for preprocedural laboratory examination: Secondary | ICD-10-CM | POA: Diagnosis not present

## 2018-08-20 HISTORY — DX: Atherosclerotic heart disease of native coronary artery without angina pectoris: I25.10

## 2018-08-20 HISTORY — DX: Chronic kidney disease, unspecified: N18.9

## 2018-08-20 HISTORY — DX: Frequency of micturition: R35.0

## 2018-08-20 HISTORY — DX: Unspecified osteoarthritis, unspecified site: M19.90

## 2018-08-20 LAB — BASIC METABOLIC PANEL
Anion gap: 8 (ref 5–15)
BUN: 13 mg/dL (ref 8–23)
CALCIUM: 9.5 mg/dL (ref 8.9–10.3)
CO2: 25 mmol/L (ref 22–32)
Chloride: 108 mmol/L (ref 98–111)
Creatinine, Ser: 0.9 mg/dL (ref 0.61–1.24)
Glucose, Bld: 137 mg/dL — ABNORMAL HIGH (ref 70–99)
Potassium: 3.9 mmol/L (ref 3.5–5.1)
SODIUM: 141 mmol/L (ref 135–145)

## 2018-08-20 LAB — CBC
HCT: 47.5 % (ref 39.0–52.0)
Hemoglobin: 15.5 g/dL (ref 13.0–17.0)
MCH: 31 pg (ref 26.0–34.0)
MCHC: 32.6 g/dL (ref 30.0–36.0)
MCV: 95 fL (ref 80.0–100.0)
NRBC: 0 % (ref 0.0–0.2)
PLATELETS: 110 10*3/uL — AB (ref 150–400)
RBC: 5 MIL/uL (ref 4.22–5.81)
RDW: 13.3 % (ref 11.5–15.5)
WBC: 7.4 10*3/uL (ref 4.0–10.5)

## 2018-08-20 NOTE — Progress Notes (Addendum)
  PCP Bertram Millard  PA-C  ICD [employer]    Dr. Crissie Sickles   E-Mail sent to Dannial Monarch and Johnney Killian of [employer] informing them of date and time of surgery.  E-Mail sent to [employer] RN  Informing time and date of pt. Surgery.  Peri-operative Implanted Device orders sent to [facility].

## 2018-08-27 ENCOUNTER — Ambulatory Visit (HOSPITAL_COMMUNITY): Payer: Medicare HMO | Admitting: Vascular Surgery

## 2018-08-27 ENCOUNTER — Encounter (HOSPITAL_COMMUNITY)
Admission: RE | Disposition: A | Discharge status: Home / Self Care | Payer: Self-pay | Source: Ambulatory Visit | Attending: General Surgery

## 2018-08-27 ENCOUNTER — Encounter (HOSPITAL_COMMUNITY): Payer: Self-pay

## 2018-08-27 ENCOUNTER — Ambulatory Visit (HOSPITAL_COMMUNITY): Payer: Medicare HMO | Admitting: Anesthesiology

## 2018-08-27 ENCOUNTER — Other Ambulatory Visit: Payer: Self-pay

## 2018-08-27 ENCOUNTER — Ambulatory Visit (HOSPITAL_COMMUNITY)
Admission: RE | Admit: 2018-08-27 | Discharge: 2018-08-28 | Disposition: A | Discharge status: Home / Self Care | Payer: Medicare HMO | Source: Ambulatory Visit | Attending: General Surgery | Admitting: General Surgery

## 2018-08-27 DIAGNOSIS — R011 Cardiac murmur, unspecified: Secondary | ICD-10-CM | POA: Insufficient documentation

## 2018-08-27 DIAGNOSIS — I509 Heart failure, unspecified: Secondary | ICD-10-CM | POA: Insufficient documentation

## 2018-08-27 DIAGNOSIS — Z95810 Presence of automatic (implantable) cardiac defibrillator: Secondary | ICD-10-CM | POA: Insufficient documentation

## 2018-08-27 DIAGNOSIS — I110 Hypertensive heart disease with heart failure: Secondary | ICD-10-CM | POA: Diagnosis not present

## 2018-08-27 DIAGNOSIS — K4090 Unilateral inguinal hernia, without obstruction or gangrene, not specified as recurrent: Secondary | ICD-10-CM

## 2018-08-27 DIAGNOSIS — Z952 Presence of prosthetic heart valve: Secondary | ICD-10-CM | POA: Insufficient documentation

## 2018-08-27 DIAGNOSIS — K5090 Crohn's disease, unspecified, without complications: Secondary | ICD-10-CM | POA: Diagnosis not present

## 2018-08-27 DIAGNOSIS — I2510 Atherosclerotic heart disease of native coronary artery without angina pectoris: Secondary | ICD-10-CM | POA: Insufficient documentation

## 2018-08-27 DIAGNOSIS — F17200 Nicotine dependence, unspecified, uncomplicated: Secondary | ICD-10-CM | POA: Insufficient documentation

## 2018-08-27 DIAGNOSIS — M1990 Unspecified osteoarthritis, unspecified site: Secondary | ICD-10-CM | POA: Insufficient documentation

## 2018-08-27 DIAGNOSIS — Z85820 Personal history of malignant melanoma of skin: Secondary | ICD-10-CM | POA: Diagnosis not present

## 2018-08-27 DIAGNOSIS — J449 Chronic obstructive pulmonary disease, unspecified: Secondary | ICD-10-CM | POA: Diagnosis not present

## 2018-08-27 DIAGNOSIS — F419 Anxiety disorder, unspecified: Secondary | ICD-10-CM | POA: Insufficient documentation

## 2018-08-27 DIAGNOSIS — Z7982 Long term (current) use of aspirin: Secondary | ICD-10-CM | POA: Insufficient documentation

## 2018-08-27 DIAGNOSIS — Z79899 Other long term (current) drug therapy: Secondary | ICD-10-CM | POA: Insufficient documentation

## 2018-08-27 HISTORY — PX: INSERTION OF MESH: SHX5868

## 2018-08-27 HISTORY — PX: INGUINAL HERNIA REPAIR: SHX194

## 2018-08-27 SURGERY — REPAIR, HERNIA, INGUINAL, ADULT
Anesthesia: General | Site: Groin | Laterality: Right

## 2018-08-27 MED ORDER — PROPOFOL 10 MG/ML IV BOLUS
INTRAVENOUS | Status: AC
  Filled 2018-08-27: qty 20

## 2018-08-27 MED ORDER — FENTANYL CITRATE (PF) 100 MCG/2ML IJ SOLN
INTRAMUSCULAR | Status: DC | PRN
  Administered 2018-08-27 (×2): 50 ug via INTRAVENOUS

## 2018-08-27 MED ORDER — ONDANSETRON HCL 4 MG/2ML IJ SOLN
INTRAMUSCULAR | Status: DC | PRN
  Administered 2018-08-27: 4 mg via INTRAVENOUS

## 2018-08-27 MED ORDER — FENTANYL CITRATE (PF) 250 MCG/5ML IJ SOLN
INTRAMUSCULAR | Status: AC
  Filled 2018-08-27: qty 5

## 2018-08-27 MED ORDER — SACUBITRIL-VALSARTAN 49-51 MG PO TABS
1.0000 | ORAL_TABLET | Freq: Two times a day (BID) | ORAL | Status: DC
  Administered 2018-08-27 – 2018-08-28 (×2): 1 via ORAL
  Filled 2018-08-27 (×3): qty 1

## 2018-08-27 MED ORDER — MEPERIDINE HCL 50 MG/ML IJ SOLN: 6.2500 mg | INTRAMUSCULAR | Status: DC | PRN

## 2018-08-27 MED ORDER — ETOMIDATE 2 MG/ML IV SOLN
INTRAVENOUS | Status: AC
  Filled 2018-08-27: qty 10

## 2018-08-27 MED ORDER — LIDOCAINE 2% (20 MG/ML) 5 ML SYRINGE
INTRAMUSCULAR | Status: DC | PRN
  Administered 2018-08-27: 100 mg via INTRAVENOUS

## 2018-08-27 MED ORDER — ONDANSETRON HCL 4 MG/2ML IJ SOLN: 4.0000 mg | Freq: Four times a day (QID) | INTRAMUSCULAR | Status: DC | PRN

## 2018-08-27 MED ORDER — CLONAZEPAM 0.5 MG PO TABS: 0.5000 mg | ORAL_TABLET | Freq: Every day | ORAL | Status: DC | PRN

## 2018-08-27 MED ORDER — ONDANSETRON HCL 4 MG/2ML IJ SOLN
INTRAMUSCULAR | Status: AC
  Filled 2018-08-27: qty 2

## 2018-08-27 MED ORDER — HYDROMORPHONE HCL 1 MG/ML IJ SOLN: 0.2500 mg | INTRAMUSCULAR | Status: DC | PRN

## 2018-08-27 MED ORDER — SODIUM CHLORIDE 0.9 % IV SOLN
INTRAVENOUS | Status: DC | PRN
  Administered 2018-08-27: 15 ug/min via INTRAVENOUS

## 2018-08-27 MED ORDER — LACTATED RINGERS IV SOLN
INTRAVENOUS | Status: DC
  Administered 2018-08-27: 09:00:00 via INTRAVENOUS

## 2018-08-27 MED ORDER — PANTOPRAZOLE SODIUM 20 MG PO TBEC
20.0000 mg | DELAYED_RELEASE_TABLET | Freq: Every day | ORAL | Status: DC
  Administered 2018-08-28: 20 mg via ORAL
  Filled 2018-08-27 (×2): qty 1

## 2018-08-27 MED ORDER — DEXAMETHASONE SODIUM PHOSPHATE 10 MG/ML IJ SOLN
INTRAMUSCULAR | Status: DC | PRN
  Administered 2018-08-27: 10 mg via INTRAVENOUS

## 2018-08-27 MED ORDER — ATORVASTATIN CALCIUM 20 MG PO TABS
20.0000 mg | ORAL_TABLET | Freq: Every day | ORAL | Status: DC
  Filled 2018-08-27: qty 1

## 2018-08-27 MED ORDER — CELECOXIB 200 MG PO CAPS
200.0000 mg | ORAL_CAPSULE | ORAL | Status: AC
  Administered 2018-08-27: 200 mg via ORAL
  Filled 2018-08-27: qty 1

## 2018-08-27 MED ORDER — ROCURONIUM BROMIDE 50 MG/5ML IV SOSY
PREFILLED_SYRINGE | INTRAVENOUS | Status: AC
  Filled 2018-08-27: qty 5

## 2018-08-27 MED ORDER — BUPIVACAINE-EPINEPHRINE 0.25% -1:200000 IJ SOLN
INTRAMUSCULAR | Status: DC | PRN
  Administered 2018-08-27: 30 mL

## 2018-08-27 MED ORDER — SPIRONOLACTONE 12.5 MG HALF TABLET
12.5000 mg | ORAL_TABLET | Freq: Every day | ORAL | Status: DC
  Administered 2018-08-28: 12.5 mg via ORAL
  Filled 2018-08-27 (×2): qty 1

## 2018-08-27 MED ORDER — MIDAZOLAM HCL 2 MG/2ML IJ SOLN
INTRAMUSCULAR | Status: AC
  Filled 2018-08-27: qty 2

## 2018-08-27 MED ORDER — METOPROLOL SUCCINATE ER 25 MG PO TB24
12.5000 mg | ORAL_TABLET | Freq: Every day | ORAL | Status: DC
  Administered 2018-08-28: 12.5 mg via ORAL
  Filled 2018-08-27: qty 1

## 2018-08-27 MED ORDER — CEFAZOLIN SODIUM-DEXTROSE 2-4 GM/100ML-% IV SOLN
2.0000 g | INTRAVENOUS | Status: AC
  Administered 2018-08-27: 2 g via INTRAVENOUS
  Filled 2018-08-27: qty 100

## 2018-08-27 MED ORDER — ROCURONIUM BROMIDE 10 MG/ML (PF) SYRINGE
PREFILLED_SYRINGE | INTRAVENOUS | Status: DC | PRN
  Administered 2018-08-27: 50 mg via INTRAVENOUS

## 2018-08-27 MED ORDER — ACETAMINOPHEN 500 MG PO TABS
1000.0000 mg | ORAL_TABLET | ORAL | Status: AC
  Administered 2018-08-27: 1000 mg via ORAL
  Filled 2018-08-27: qty 2

## 2018-08-27 MED ORDER — DEXAMETHASONE SODIUM PHOSPHATE 10 MG/ML IJ SOLN
INTRAMUSCULAR | Status: AC
  Filled 2018-08-27: qty 1

## 2018-08-27 MED ORDER — GABAPENTIN 300 MG PO CAPS
300.0000 mg | ORAL_CAPSULE | ORAL | Status: AC
  Administered 2018-08-27: 300 mg via ORAL
  Filled 2018-08-27: qty 1

## 2018-08-27 MED ORDER — LIDOCAINE 2% (20 MG/ML) 5 ML SYRINGE
INTRAMUSCULAR | Status: AC
  Filled 2018-08-27: qty 5

## 2018-08-27 MED ORDER — PROPOFOL 10 MG/ML IV BOLUS
INTRAVENOUS | Status: DC | PRN
  Administered 2018-08-27: 50 mg via INTRAVENOUS

## 2018-08-27 MED ORDER — OXYCODONE HCL 5 MG/5ML PO SOLN: 5.0000 mg | Freq: Once | ORAL | Status: DC | PRN

## 2018-08-27 MED ORDER — CHLORHEXIDINE GLUCONATE CLOTH 2 % EX PADS: 6.0000 | MEDICATED_PAD | Freq: Once | CUTANEOUS | Status: DC

## 2018-08-27 MED ORDER — HYDROCODONE-ACETAMINOPHEN 5-325 MG PO TABS: 1.0000 | ORAL_TABLET | Freq: Four times a day (QID) | ORAL | 0 refills | Status: DC | PRN

## 2018-08-27 MED ORDER — METHOCARBAMOL 500 MG PO TABS: 500.0000 mg | ORAL_TABLET | Freq: Four times a day (QID) | ORAL | Status: DC | PRN

## 2018-08-27 MED ORDER — MIDAZOLAM HCL 5 MG/5ML IJ SOLN
INTRAMUSCULAR | Status: DC | PRN
  Administered 2018-08-27: 2 mg via INTRAVENOUS

## 2018-08-27 MED ORDER — FUROSEMIDE 20 MG PO TABS: 20.0000 mg | ORAL_TABLET | ORAL | Status: DC | PRN

## 2018-08-27 MED ORDER — ETOMIDATE 2 MG/ML IV SOLN
INTRAVENOUS | Status: DC | PRN
  Administered 2018-08-27: 8 mg via INTRAVENOUS

## 2018-08-27 MED ORDER — 0.9 % SODIUM CHLORIDE (POUR BTL) OPTIME
TOPICAL | Status: DC | PRN
  Administered 2018-08-27: 1000 mL

## 2018-08-27 MED ORDER — PROMETHAZINE HCL 25 MG/ML IJ SOLN
INTRAMUSCULAR | Status: AC
  Filled 2018-08-27: qty 1

## 2018-08-27 MED ORDER — MORPHINE SULFATE (PF) 2 MG/ML IV SOLN: 1.0000 mg | INTRAVENOUS | Status: DC | PRN

## 2018-08-27 MED ORDER — PROMETHAZINE HCL 25 MG/ML IJ SOLN
6.2500 mg | INTRAMUSCULAR | Status: DC | PRN
  Administered 2018-08-27: 6.25 mg via INTRAVENOUS

## 2018-08-27 MED ORDER — OXYCODONE HCL 5 MG PO TABS: 5.0000 mg | ORAL_TABLET | Freq: Once | ORAL | Status: DC | PRN

## 2018-08-27 MED ORDER — HYDROCODONE-ACETAMINOPHEN 5-325 MG PO TABS: 1.0000 | ORAL_TABLET | ORAL | Status: DC | PRN

## 2018-08-27 MED ORDER — FENTANYL CITRATE (PF) 100 MCG/2ML IJ SOLN
INTRAMUSCULAR | Status: AC
  Filled 2018-08-27: qty 2

## 2018-08-27 MED ORDER — HEPARIN SODIUM (PORCINE) 5000 UNIT/ML IJ SOLN
5000.0000 [IU] | Freq: Three times a day (TID) | INTRAMUSCULAR | Status: DC
  Administered 2018-08-28: 5000 [IU] via SUBCUTANEOUS
  Filled 2018-08-27: qty 1

## 2018-08-27 MED ORDER — BUPIVACAINE-EPINEPHRINE (PF) 0.25% -1:200000 IJ SOLN
INTRAMUSCULAR | Status: AC
  Filled 2018-08-27: qty 30

## 2018-08-27 MED ORDER — ONDANSETRON 4 MG PO TBDP: 4.0000 mg | ORAL_TABLET | Freq: Four times a day (QID) | ORAL | Status: DC | PRN

## 2018-08-27 MED ORDER — SUGAMMADEX SODIUM 200 MG/2ML IV SOLN
INTRAVENOUS | Status: DC | PRN
  Administered 2018-08-27: 175 mg via INTRAVENOUS

## 2018-08-27 SURGICAL SUPPLY — 41 items
BLADE CLIPPER SURG (BLADE) ×3 IMPLANT
BLADE SURG 10 STRL SS (BLADE) ×3 IMPLANT
CHLORAPREP W/TINT 26ML (MISCELLANEOUS) ×3 IMPLANT
COVER SURGICAL LIGHT HANDLE (MISCELLANEOUS) ×3 IMPLANT
COVER WAND RF STERILE (DRAPES) IMPLANT
DECANTER SPIKE VIAL GLASS SM (MISCELLANEOUS) ×3 IMPLANT
DERMABOND ADVANCED (GAUZE/BANDAGES/DRESSINGS) ×2
DERMABOND ADVANCED .7 DNX12 (GAUZE/BANDAGES/DRESSINGS) ×1 IMPLANT
DRAIN PENROSE 1/2X12 LTX STRL (WOUND CARE) ×3 IMPLANT
DRAPE LAPAROSCOPIC ABDOMINAL (DRAPES) ×3 IMPLANT
DRAPE UTILITY XL STRL (DRAPES) ×3 IMPLANT
ELECT CAUTERY BLADE 6.4 (BLADE) ×3 IMPLANT
ELECT REM PT RETURN 9FT ADLT (ELECTROSURGICAL) ×3
ELECTRODE REM PT RTRN 9FT ADLT (ELECTROSURGICAL) ×1 IMPLANT
GLOVE BIO SURGEON STRL SZ7.5 (GLOVE) ×3 IMPLANT
GOWN STRL REUS W/ TWL LRG LVL3 (GOWN DISPOSABLE) ×2 IMPLANT
GOWN STRL REUS W/TWL LRG LVL3 (GOWN DISPOSABLE) ×4
KIT BASIN OR (CUSTOM PROCEDURE TRAY) ×3 IMPLANT
KIT TURNOVER KIT B (KITS) ×3 IMPLANT
MESH ULTRAPRO 3X6 7.6X15CM (Mesh General) ×3 IMPLANT
NEEDLE HYPO 25GX1X1/2 BEV (NEEDLE) ×3 IMPLANT
NS IRRIG 1000ML POUR BTL (IV SOLUTION) ×3 IMPLANT
PACK SURGICAL SETUP 50X90 (CUSTOM PROCEDURE TRAY) ×3 IMPLANT
PAD ARMBOARD 7.5X6 YLW CONV (MISCELLANEOUS) ×3 IMPLANT
PENCIL BUTTON HOLSTER BLD 10FT (ELECTRODE) ×3 IMPLANT
SPONGE LAP 18X18 X RAY DECT (DISPOSABLE) ×6 IMPLANT
SUT MNCRL AB 4-0 PS2 18 (SUTURE) ×3 IMPLANT
SUT PROLENE 2 0 SH DA (SUTURE) ×6 IMPLANT
SUT SILK 2 0 SH (SUTURE) ×3 IMPLANT
SUT SILK 3 0 (SUTURE) ×2
SUT SILK 3-0 18XBRD TIE 12 (SUTURE) ×1 IMPLANT
SUT VIC AB 0 CT1 27 (SUTURE) ×2
SUT VIC AB 0 CT1 27XBRD ANBCTR (SUTURE) ×1 IMPLANT
SUT VIC AB 2-0 SH 27 (SUTURE) ×2
SUT VIC AB 2-0 SH 27X BRD (SUTURE) ×1 IMPLANT
SUT VIC AB 3-0 SH 27 (SUTURE) ×2
SUT VIC AB 3-0 SH 27XBRD (SUTURE) ×1 IMPLANT
SYR BULB 3OZ (MISCELLANEOUS) ×3 IMPLANT
SYR CONTROL 10ML LL (SYRINGE) ×3 IMPLANT
TOWEL OR 17X24 6PK STRL BLUE (TOWEL DISPOSABLE) IMPLANT
TOWEL OR 17X26 10 PK STRL BLUE (TOWEL DISPOSABLE) ×3 IMPLANT

## 2018-08-27 NOTE — Interval H&P Note (Signed)
History and Physical Interval Note:  08/27/2018 9:52 AM  Kenneth Mills  has presented today for surgery, with the diagnosis of RIGHT INGUINAL HERNIA  The various methods of treatment have been discussed with the patient and family. After consideration of risks, benefits and other options for treatment, the patient has consented to  Procedure(s): RIGHT INGUINAL HERNIA REPAIR WITH MESH (Right) INSERTION OF MESH (Right) as a surgical intervention .  The patient's history has been reviewed, patient examined, no change in status, stable for surgery.  I have reviewed the patient's chart and labs.  Questions were answered to the patient's satisfaction.     Autumn Messing III

## 2018-08-27 NOTE — Transfer of Care (Signed)
Immediate Anesthesia Transfer of Care Note  Patient: Kenneth Mills  Procedure(s) Performed: RIGHT INGUINAL HERNIA REPAIR WITH MESH (Right Groin) INSERTION OF MESH (Right Groin)  Patient Location: PACU  Anesthesia Type:General  Level of Consciousness: oriented, drowsy and patient cooperative  Airway & Oxygen Therapy: Patient Spontanous Breathing and Patient connected to face mask oxygen  Post-op Assessment: Report given to RN and Post -op Vital signs reviewed and stable  Post vital signs: Reviewed  Last Vitals:  Vitals Value Taken Time  BP 150/64 08/27/2018 11:50 AM  Temp    Pulse 72 08/27/2018 11:52 AM  Resp 19 08/27/2018 11:52 AM  SpO2 98 % 08/27/2018 11:52 AM  Vitals shown include unvalidated device data.  Last Pain:  Vitals:   08/27/18 0903  TempSrc:   PainSc: 0-No pain      Patients Stated Pain Goal: 3 (98/42/10 3128)  Complications: No apparent anesthesia complications

## 2018-08-27 NOTE — Anesthesia Procedure Notes (Signed)

## 2018-08-27 NOTE — H&P (Signed)
Kenneth Mills  Location: [facility] Surgery Patient #: [id number] DOB: 01-03-1951 Single / Language: Kenneth Mills / Race: White Male   History of Present Illness  The patient is a 67 year old male who presents with an inguinal hernia. We are asked to see the patient in consultation by Dr. Norberto Sorenson to evaluate him for a right inguinal hernia. The patient is a 67 year old white male who has been noticing a bulge in his right groin for the last 6 weeks. He has some discomfort associated with it. He denies any fevers or chills. He denies any nausea or vomiting. His appetite is good and his bowels are moving regularly. He does have a history of heart valve replacement in December 2018 with what he thinks is a bovine valve. He also had a pacemaker placed in August. He is followed by Dr. Lovena Le and Dr. Zoila Shutter.   Past Surgical History Laparoscopic Inguinal Hernia Surgery  Left. Oral Surgery  Valve Replacement  Vasectomy   Diagnostic Studies History  Colonoscopy  5-10 years ago  Allergies No Known Drug Allergies  Allergies Reconciled   Medication History  Atorvastatin Calcium (20MG Tablet, Oral) Active. Corlanor (5MG Tablet, Oral) Active. Metoprolol Succinate ER (25MG Tablet ER 24HR, Oral) Active. Spironolactone (25MG Tablet, Oral) Active. Aspirin (325MG Tablet, Oral) Active. Medications Reconciled  Other Problems  Anxiety Disorder  Congestive Heart Failure  Crohn's Disease  Inguinal Hernia  Melanoma     Review of Systems  General Not Present- Appetite Loss, Chills, Fatigue, Fever, Night Sweats, Weight Gain and Weight Loss. Skin Not Present- Change in Wart/Mole, Dryness, Hives, Jaundice, New Lesions, Non-Healing Wounds, Rash and Ulcer. HEENT Not Present- Earache, Hearing Loss, Hoarseness, Nose Bleed, Oral Ulcers, Ringing in the Ears, Seasonal Allergies, Sinus Pain, Sore Throat, Visual Disturbances, Wears glasses/contact lenses and Yellow Eyes. Breast  Not Present- Breast Mass, Breast Pain, Nipple Discharge and Skin Changes. Cardiovascular Not Present- Chest Pain, Difficulty Breathing Lying Down, Leg Cramps, Palpitations, Rapid Heart Rate, Shortness of Breath and Swelling of Extremities. Gastrointestinal Not Present- Abdominal Pain, Bloating, Bloody Stool, Change in Bowel Habits, Chronic diarrhea, Constipation, Difficulty Swallowing, Excessive gas, Gets full quickly at meals, Hemorrhoids, Indigestion, Nausea, Rectal Pain and Vomiting. Male Genitourinary Not Present- Blood in Urine, Change in Urinary Stream, Frequency, Impotence, Nocturia, Painful Urination, Urgency and Urine Leakage. Musculoskeletal Present- Joint Pain, Muscle Weakness and Swelling of Extremities. Not Present- Back Pain, Joint Stiffness and Muscle Pain. Neurological Not Present- Decreased Memory, Fainting, Headaches, Numbness, Seizures, Tingling, Tremor, Trouble walking and Weakness. Psychiatric Not Present- Anxiety, Bipolar, Change in Sleep Pattern, Depression, Fearful and Frequent crying. Endocrine Not Present- Cold Intolerance, Excessive Hunger, Hair Changes, Heat Intolerance, Hot flashes and New Diabetes. Hematology Present- Blood Thinners and Easy Bruising. Not Present- Excessive bleeding, Gland problems, HIV and Persistent Infections.  Vitals Weight: 174.3 lb Height: 70in Body Surface Area: 1.97 m Body Mass Index: 25.01 kg/m  Temp.: 97.67F  Pulse: 68 (Regular)  BP: 136/84 (Sitting, Left Arm, Standard)       Physical Exam  General Mental Status-Alert. General Appearance-Consistent with stated age. Hydration-Well hydrated. Voice-Normal.  Head and Neck Head-normocephalic, atraumatic with no lesions or palpable masses. Trachea-midline. Thyroid Gland Characteristics - normal size and consistency.  Eye Eyeball - Bilateral-Extraocular movements intact. Sclera/Conjunctiva - Bilateral-No scleral icterus.  Chest and Lung Exam Chest  and lung exam reveals -quiet, even and easy respiratory effort with no use of accessory muscles and on auscultation, normal breath sounds, no adventitious sounds and normal vocal resonance. Inspection Chest  Wall - Normal. Back - normal.  Cardiovascular Cardiovascular examination reveals -normal heart sounds, regular rate and rhythm with no murmurs and normal pedal pulses bilaterally.  Abdomen Inspection Inspection of the abdomen reveals - No Hernias. Skin - Scar - no surgical scars. Palpation/Percussion Palpation and Percussion of the abdomen reveal - Soft, Non Tender, No Rebound tenderness, No Rigidity (guarding) and No hepatosplenomegaly. Auscultation Auscultation of the abdomen reveals - Bowel sounds normal.  Male Genitourinary Note: There is a moderate-sized bulge in the right groin that reduces easily with palpation. There is no palpable bulge or impulse with straining in the left groin.   Neurologic Neurologic evaluation reveals -alert and oriented x 3 with no impairment of recent or remote memory. Mental Status-Normal.  Musculoskeletal Normal Exam - Left-Upper Extremity Strength Normal and Lower Extremity Strength Normal. Normal Exam - Right-Upper Extremity Strength Normal and Lower Extremity Strength Normal.  Lymphatic Head & Neck  General Head & Neck Lymphatics: Bilateral - Description - Normal. Axillary  General Axillary Region: Bilateral - Description - Normal. Tenderness - Non Tender. Femoral & Inguinal  Generalized Femoral & Inguinal Lymphatics: Bilateral - Description - Normal. Tenderness - Non Tender.    Assessment & Plan  INGUINAL HERNIA OF RIGHT SIDE WITHOUT OBSTRUCTION OR GANGRENE (K40.90) Impression: The patient appears to have a moderate size right inguinal hernia that is symptomatic. Because of the risk of incarceration and strangulation at the could benefit from having this fixed. He would also like to have this done. I have discussed with  him in detail the risks and benefits of the operation as well as some of the technical aspects including the use of mesh and he understands and wishes to proceed. Given his history of pacemaker and heart valve replacement in the last year he will need cardiac clearance prior to scheduling surgery.

## 2018-08-27 NOTE — Anesthesia Postprocedure Evaluation (Signed)
Anesthesia Post Note  Patient: Kenneth Mills  Procedure(s) Performed: RIGHT INGUINAL HERNIA REPAIR WITH MESH (Right Groin) INSERTION OF MESH (Right Groin)     Patient location during evaluation: PACU Anesthesia Type: General Level of consciousness: awake and alert Pain management: pain level controlled Vital Signs Assessment: post-procedure vital signs reviewed and stable Respiratory status: spontaneous breathing, nonlabored ventilation and respiratory function stable Cardiovascular status: blood pressure returned to baseline and stable Postop Assessment: no apparent nausea or vomiting Anesthetic complications: no    Last Vitals:  Vitals:   08/27/18 1315 08/27/18 1326  BP: 131/75 120/71  Pulse: 60 60  Resp: 17 19  Temp:  (!) 36.4 C  SpO2: 95% 95%    Last Pain:  Vitals:   08/27/18 1315  TempSrc:   PainSc: 0-No pain                 Lynda Rainwater

## 2018-08-27 NOTE — Op Note (Signed)
08/27/2018  11:41 AM  PATIENT:  Kenneth Mills  67 y.o. male  PRE-OPERATIVE DIAGNOSIS:  RIGHT INGUINAL HERNIA  POST-OPERATIVE DIAGNOSIS:  RIGHT INGUINAL HERNIA  PROCEDURE:  Procedure(s): RIGHT INGUINAL HERNIA REPAIR WITH MESH (Right) INSERTION OF MESH (Right)  SURGEON:  Surgeon(s) and Role:    * Jovita Kussmaul, MD - Primary  PHYSICIAN ASSISTANT:   ASSISTANTS: none   ANESTHESIA:   local and general  EBL:  10 mL   BLOOD ADMINISTERED:none  DRAINS: none   LOCAL MEDICATIONS USED:  MARCAINE     SPECIMEN:  No Specimen  DISPOSITION OF SPECIMEN:  N/A  COUNTS:  YES  TOURNIQUET:  * No tourniquets in log *  DICTATION: .Dragon Dictation   After informed consent was obtained the patient was brought to the operating room and placed in the supine position on the operating table.  After adequate induction of general anesthesia the patient's abdomen and right groin were prepped with ChloraPrep, allowed to dry, and draped in usual sterile manner.  An appropriate timeout was performed.  The right groin area was then infiltrated with quarter percent Marcaine.  A small incision was made from the edge of the pubic tubercle towards the anterior superior iliac spine with a 15 blade knife.  The incision was carried through the skin and subcutaneous tissue sharply with electrocautery until the fascia of the external oblique was encountered.  A small bridging vein was clamped with hemostats, divided, and ligated with 3-0 silk ties.  The external oblique fascia was opened along its fibers towards the apex of the external ring with a 15 blade knife and Metzenbaum scissors.  A [place] retractor was deployed.  Blunt dissection was carried out of the cord structures until they could be surrounded between 2 fingers.  Half inch Penrose drain was placed around the cord structures for retraction purposes.  The cord was then gently skeletonized by blunt hemostat dissection I was able to identify a sac and  separated from the rest of the cord structures.  The vas deferens was stuck to the wall of the sac and it took a lot of tedious blunt dissection to separate the 2 but care was taken to avoid injury to the vas deferens or testicular artery.  The sac was then ligated at the space with a 2-0 silk suture ligature and the stump of the sac was allowed to retract back beneath the transversalis.  Next a 3 x 6 piece of ultra Pro mesh was chosen and cut to the appropriate size.  The mesh was sewed inferiorly to the shelving edge of the inguinal ligament with a running 2-0 Prolene stitch.  Tails were cut in the mesh laterally and the tails were wrapped around the cord structures.  The tails were anchored to the shelving edge of the inguinal ligament lateral to the cord with interrupted 2-0 Prolene stitches.  Superiorly the mesh was sewed to the musculoaponeurotic strength of the transversalis with interrupted 2-0 Prolene vertical mattress stitches.  The ilioinguinal nerve was looked for but never seen.  Once this was accomplished the mesh appeared to be in good position and the hernia seem well repaired.  The wound was irrigated with copious amounts of saline.  The external oblique fascia was then closed with a running 2-0 Vicryl stitch.  The wound was infiltrated with more quarter percent Marcaine.  The subcutaneous fascia was closed with a running 3-0 Vicryl stitch.  The skin was closed with a running 4 Monocryl subcuticular stitch.  Dermabond dressings were applied.  The patient tolerated the procedure well.  At the end of the case all needle sponge and instrument counts were correct.  The patient was then awakened and taken to recovery in stable condition.  The patient's testicles in the scrotum at the end of the case.  PLAN OF CARE: Admit for overnight observation  PATIENT DISPOSITION:  PACU - hemodynamically stable.   Delay start of Pharmacological VTE agent (>24hrs) due to surgical blood loss or risk of bleeding:  no

## 2018-08-27 NOTE — Anesthesia Preprocedure Evaluation (Addendum)
Anesthesia Evaluation  Patient identified by MRN, date of birth, ID band Patient awake    Reviewed: Allergy & Precautions, NPO status , Patient's Chart, lab work & pertinent test results, reviewed documented beta blocker date and time   Airway Mallampati: II  TM Distance: >3 FB Neck ROM: Full    Dental  (+) Teeth Intact, Dental Advisory Given, Poor Dentition, Caps   Pulmonary COPD, Current Smoker,    breath sounds clear to auscultation       Cardiovascular hypertension, Pt. on medications and Pt. on home beta blockers + CAD  + dysrhythmias + pacemaker + Cardiac Defibrillator  Rhythm:Regular + Systolic murmurs    Neuro/Psych Anxiety    GI/Hepatic   Endo/Other    Renal/GU      Musculoskeletal  (+) Arthritis , Osteoarthritis,    Abdominal   Peds  Hematology   Anesthesia Other Findings   Reproductive/Obstetrics                            Anesthesia Physical  Anesthesia Plan  ASA: III  Anesthesia Plan: General   Post-op Pain Management:    Induction: Intravenous  PONV Risk Score and Plan: 1 and Ondansetron  Airway Management Planned: Oral ETT  Additional Equipment: Arterial line  Intra-op Plan:   Post-operative Plan: Extubation in OR  Informed Consent: I have reviewed the patients History and Physical, chart, labs and discussed the procedure including the risks, benefits and alternatives for the proposed anesthesia with the patient or authorized representative who has indicated his/her understanding and acceptance.   Dental advisory given  Plan Discussed with: CRNA and Anesthesiologist  Anesthesia Plan Comments:         Anesthesia Quick Evaluation

## 2018-08-28 ENCOUNTER — Encounter (HOSPITAL_COMMUNITY): Payer: Self-pay | Admitting: General Surgery

## 2018-08-28 DIAGNOSIS — F419 Anxiety disorder, unspecified: Secondary | ICD-10-CM | POA: Diagnosis not present

## 2018-08-28 DIAGNOSIS — R011 Cardiac murmur, unspecified: Secondary | ICD-10-CM | POA: Diagnosis not present

## 2018-08-28 DIAGNOSIS — J449 Chronic obstructive pulmonary disease, unspecified: Secondary | ICD-10-CM | POA: Diagnosis not present

## 2018-08-28 DIAGNOSIS — K4090 Unilateral inguinal hernia, without obstruction or gangrene, not specified as recurrent: Secondary | ICD-10-CM | POA: Diagnosis not present

## 2018-08-28 DIAGNOSIS — K5090 Crohn's disease, unspecified, without complications: Secondary | ICD-10-CM | POA: Diagnosis not present

## 2018-08-28 DIAGNOSIS — I110 Hypertensive heart disease with heart failure: Secondary | ICD-10-CM | POA: Diagnosis not present

## 2018-08-28 DIAGNOSIS — I2510 Atherosclerotic heart disease of native coronary artery without angina pectoris: Secondary | ICD-10-CM | POA: Diagnosis not present

## 2018-08-28 DIAGNOSIS — I509 Heart failure, unspecified: Secondary | ICD-10-CM | POA: Diagnosis not present

## 2018-08-28 DIAGNOSIS — M1990 Unspecified osteoarthritis, unspecified site: Secondary | ICD-10-CM | POA: Diagnosis not present

## 2018-08-28 MED ORDER — HYDROCODONE-ACETAMINOPHEN 5-325 MG PO TABS: 1.0000 | ORAL_TABLET | Freq: Four times a day (QID) | ORAL | 0 refills | Status: DC | PRN

## 2018-08-28 NOTE — Plan of Care (Signed)

## 2018-08-28 NOTE — Progress Notes (Signed)
1 Day Post-Op   Subjective/Chief Complaint: No complaints   Objective: Vital signs in last 24 hours: Temp:  [97.3 F (36.3 C)-97.9 F (36.6 C)] 97.9 F (36.6 C) (10/22 0609) Pulse Rate:  [59-88] 60 (10/22 0609) Resp:  [15-20] 16 (10/22 0609) BP: (107-150)/(60-83) 111/60 (10/22 0609) SpO2:  [94 %-100 %] 95 % (10/22 0609) Arterial Line BP: (140-150)/(59-66) 148/66 (10/21 1215) Weight:  [79.4 kg] 79.4 kg (10/21 0838) Last BM Date: 08/26/18  Intake/Output from previous day: 10/21 0701 - 10/22 0700 In: 975 [P.O.:175; I.V.:800] Out: 10 [Blood:10] Intake/Output this shift: No intake/output data recorded.  General appearance: alert and cooperative Resp: clear to auscultation bilaterally Cardio: regular rate and rhythm GI: soft, mild tenderness  Lab Results:  No results for input(s): WBC, HGB, HCT, PLT in the last 72 hours. BMET No results for input(s): NA, K, CL, CO2, GLUCOSE, BUN, CREATININE, CALCIUM in the last 72 hours. PT/INR No results for input(s): LABPROT, INR in the last 72 hours. ABG No results for input(s): PHART, HCO3 in the last 72 hours.  Invalid input(s): PCO2, PO2  Studies/Results: No results found.  Anti-infectives: Anti-infectives (From admission, onward)   Start     Dose/Rate Route Frequency Ordered Stop   08/27/18 0845  ceFAZolin (ANCEF) IVPB 2g/100 mL premix     2 g 200 mL/hr over 30 Minutes Intravenous On call to O.R. 08/27/18 0831 08/27/18 1023      Assessment/Plan: s/p Procedure(s): RIGHT INGUINAL HERNIA REPAIR WITH MESH (Right) INSERTION OF MESH (Right) Advance diet Discharge  LOS: 0 days    Autumn Messing III 08/28/2018

## 2018-09-19 ENCOUNTER — Encounter: Payer: Medicare HMO | Admitting: Internal Medicine

## 2018-09-25 ENCOUNTER — Ambulatory Visit: Payer: Medicare HMO | Admitting: Internal Medicine

## 2018-09-25 ENCOUNTER — Encounter: Payer: Self-pay | Admitting: Internal Medicine

## 2018-09-25 VITALS — BP 138/80 | HR 68 | Ht 70.5 in | Wt 171.2 lb

## 2018-09-25 DIAGNOSIS — I5022 Chronic systolic (congestive) heart failure: Secondary | ICD-10-CM

## 2018-09-25 DIAGNOSIS — I447 Left bundle-branch block, unspecified: Secondary | ICD-10-CM

## 2018-09-25 DIAGNOSIS — Z95810 Presence of automatic (implantable) cardiac defibrillator: Secondary | ICD-10-CM | POA: Diagnosis not present

## 2018-09-25 LAB — CUP PACEART INCLINIC DEVICE CHECK
Battery Remaining Longevity: 92 mo
Battery Voltage: 2.99 V
Brady Statistic AP VS Percent: 0.02 %
Brady Statistic AS VP Percent: 89.44 %
Brady Statistic RA Percent Paced: 10.48 %
Brady Statistic RV Percent Paced: 99.81 %
HIGH POWER IMPEDANCE MEASURED VALUE: 70 Ohm
Implantable Lead Implant Date: 20190813
Implantable Lead Location: 753859
Implantable Lead Location: 753860
Implantable Lead Model: 5076
Implantable Lead Model: 6935
Implantable Pulse Generator Implant Date: 20190813
Lead Channel Impedance Value: 199.5 Ohm
Lead Channel Impedance Value: 212.8 Ohm
Lead Channel Impedance Value: 232.653
Lead Channel Impedance Value: 399 Ohm
Lead Channel Impedance Value: 399 Ohm
Lead Channel Impedance Value: 456 Ohm
Lead Channel Impedance Value: 475 Ohm
Lead Channel Impedance Value: 532 Ohm
Lead Channel Impedance Value: 532 Ohm
Lead Channel Impedance Value: 589 Ohm
Lead Channel Impedance Value: 703 Ohm
Lead Channel Impedance Value: 760 Ohm
Lead Channel Pacing Threshold Amplitude: 0.5 V
Lead Channel Pacing Threshold Pulse Width: 0.4 ms
Lead Channel Sensing Intrinsic Amplitude: 1.25 mV
Lead Channel Sensing Intrinsic Amplitude: 7.25 mV
Lead Channel Sensing Intrinsic Amplitude: 7.625 mV
Lead Channel Setting Pacing Amplitude: 1.5 V
Lead Channel Setting Pacing Amplitude: 2.5 V
Lead Channel Setting Pacing Pulse Width: 0.4 ms
MDC IDC LEAD IMPLANT DT: 20190813
MDC IDC LEAD IMPLANT DT: 20190813
MDC IDC LEAD LOCATION: 753858
MDC IDC MSMT LEADCHNL LV IMPEDANCE VALUE: 216.848
MDC IDC MSMT LEADCHNL LV IMPEDANCE VALUE: 216.848
MDC IDC MSMT LEADCHNL LV IMPEDANCE VALUE: 665 Ohm
MDC IDC MSMT LEADCHNL LV IMPEDANCE VALUE: 722 Ohm
MDC IDC MSMT LEADCHNL LV IMPEDANCE VALUE: 779 Ohm
MDC IDC MSMT LEADCHNL LV PACING THRESHOLD AMPLITUDE: 1 V
MDC IDC MSMT LEADCHNL RA IMPEDANCE VALUE: 513 Ohm
MDC IDC MSMT LEADCHNL RA PACING THRESHOLD AMPLITUDE: 0.75 V
MDC IDC MSMT LEADCHNL RA PACING THRESHOLD PULSEWIDTH: 0.4 ms
MDC IDC MSMT LEADCHNL RA SENSING INTR AMPL: 1.25 mV
MDC IDC MSMT LEADCHNL RV PACING THRESHOLD PULSEWIDTH: 0.4 ms
MDC IDC SESS DTM: 20191119140710
MDC IDC SET LEADCHNL RA PACING AMPLITUDE: 2.5 V
MDC IDC SET LEADCHNL RV PACING PULSEWIDTH: 0.4 ms
MDC IDC SET LEADCHNL RV SENSING SENSITIVITY: 0.3 mV
MDC IDC STAT BRADY AP VP PERCENT: 10.48 %
MDC IDC STAT BRADY AS VS PERCENT: 0.05 %

## 2018-09-25 NOTE — Progress Notes (Signed)
HPI Kenneth Mills returns today for followup. He has severe LV dysfunction and class 2 CHF and underwent ICD insertion several months ago. He has class 2 symptoms and has inquired about using his chain saw. He admits to dietary indiscretion.  No Known Allergies   Current Outpatient Medications  Medication Sig Dispense Refill  . aspirin 81 MG tablet Take 1 tablet (81 mg total) by mouth daily.    Marland Kitchen atorvastatin (LIPITOR) 20 MG tablet Take 1 tablet (20 mg total) by mouth daily at 6 PM. 90 tablet 3  . clonazePAM (KLONOPIN) 0.5 MG tablet Take 0.5 mg by mouth daily as needed for anxiety.     . furosemide (LASIX) 20 MG tablet Take 1 tablet (20 mg total) by mouth as needed. (Patient taking differently: Take 20 mg by mouth daily as needed for fluid. ) 30 tablet 5  . metoprolol succinate (TOPROL-XL) 25 MG 24 hr tablet Take 12.5 mg by mouth daily.    . Multiple Vitamins-Minerals (MULTIVITAMIN ADULT) CHEW Chew 1 each by mouth daily.    . sacubitril-valsartan (ENTRESTO) 49-51 MG Take 1 tablet by mouth 2 (two) times daily. 60 tablet 6  . spironolactone (ALDACTONE) 25 MG tablet Take 0.5 tablets (12.5 mg total) by mouth daily. 15 tablet 3   No current facility-administered medications for this visit.      Past Medical History:  Diagnosis Date  . AICD (automatic cardioverter/defibrillator) present 06/19/2018  . Anxiety   . Aortic stenosis, severe    a. suspect low flow, low gradient severe AS.   Marland Kitchen Arthritis   . Chronic kidney disease   . COPD (chronic obstructive pulmonary disease) ([place])   . Coronary artery disease   . Family history of adverse reaction to anesthesia    /son "passed out multiple times after anesthesia"  . Frequency of urination   . Heart murmur    "not since valve was replaced" (06/19/2018)  . High cholesterol   . Hypertension   . IBD (inflammatory bowel disease)    a. s/p partial colectomy in 2009  . Incidental pulmonary nodule, > 52m and < 871m11/11/2016   Several  small nodules in left lung  . LBBB (left bundle branch block)   . NIC[place]ischemic cardiomyopathy[place]   a. 08/2014: EF 15% suspect to be endstage CM 2/2 severe AS  . Pneumonia 08/2017  . S/P aortic valve replacement with bioprosthetic valve 10/18/2017   [facility] bovine pericardial tissue valve  . Squamous carcinoma    ight ear  . Tobacco abuse     ROS:   All systems reviewed and negative except as noted in the HPI.   Past Surgical History:  Procedure Laterality Date  . AORTIC VALVE REPLACEMENT N/A 10/18/2017   Procedure: AORTIC VALVE REPLACEMENT;  Surgeon: OwRexene AlbertsMD;  [place] Service: Open [place] Surgery;  Laterality: N/A;  . BIV ICD INSERTION CRT-D N/A 06/19/2018   Procedure: BIV ICD INSERTION CRT-D;  Surgeon: TaEvans LanceMD;  [place] [facility];  Service: Cardiovascular;  Laterality: N/A;  . CARDIAC VALVE REPLACEMENT    . COLECTOMY  2009   bowel resection, for blockage  . INGUINAL HERNIA REPAIR Left 1965  . INGUINAL HERNIA REPAIR Right 08/27/2018   Procedure: RIGHT INGUINAL HERNIA REPAIR WITH MESH;  Surgeon: ToJovita KussmaulMD;  [place] [facility] Service: General;  Laterality: Right;  . INSERTION OF MESH Right 08/27/2018  Procedure: INSERTION OF MESH;  Surgeon: Jovita Kussmaul, MD;  Location: [place];  Service: General;  Laterality: Right;  . IR FLUORO GUIDE CV MIDLINE PICC LEFT  09/04/2017  . RIGHT/LEFT HEART CATH AND CORONARY ANGIOGRAPHY N/A 09/06/2017   Procedure: RIGHT/LEFT HEART CATH AND CORONARY ANGIOGRAPHY;  Surgeon: Jolaine Artist, MD;  [place] [facility] LAB;  Service: Cardiovascular;  Laterality: N/A;  . SQUAMOUS CELL CARCINOMA EXCISION Right    "ear"  . STERNOTOMY  10/18/2017   Procedure: STERNOTOMY;  Surgeon: Rexene Alberts, MD;  Location: [facility] OR;  Service: Open Heart Surgery;;  . TEE WITHOUT CARDIOVERSION N/A 10/18/2017   Procedure: TRANSESOPHAGEAL ECHOCARDIOGRAM (TEE);  Surgeon: Rexene Alberts, MD;  Location: [place];  Service: Open Heart Surgery;  Laterality: N/A;     Family History  Problem Relation Age of Onset  . Hypertension Father   . Heart attack Father   . Lung cancer Brother      Social History   Socioeconomic History  . Marital status: Divorced    Spouse name: Not on file  . Number of children: Not on file  . Years of education: Not on file  . Highest education level: Not on file  Occupational History  . Not on file  Social Needs  . Financial resource strain: Not on file  . Food insecurity:    Worry: Not on file    Inability: Not on file  . Transportation needs:    Medical: Not on file    Non-medical: Not on file  Tobacco Use  . Smoking status: Current Some Day Smoker    Packs/day: 0.50    Years: 53.00    Pack years: 26.50    Types: Cigarettes    Start date: 30  . Smokeless tobacco: Former Systems developer    Types: Chew  Substance and Sexual Activity  . Alcohol use: Yes    Comment: 06/19/2018 "couple beers/year"  . Drug use: Not Currently    Types: Marijuana  . Sexual activity: Not on file  Lifestyle  . Physical activity:    Days per week: Not on file    Minutes per session: Not on file  . Stress: Not on file  Relationships  . Social connections:    Talks on phone: Not on file    Gets together: Not on file    Attends religious service: Not on file    Active member of club or organization: Not on file    Attends meetings of clubs or organizations: Not on file    Relationship status: Not on file  . Intimate partner violence:    Fear of current or ex partner: Not on file    Emotionally abused: Not on file    Physically abused: Not on file    Forced sexual activity: Not on file  Other Topics Concern  . Not on file  Social History Narrative  . Not on file     BP 138/80   Pulse 68   Ht 5' 10.5" (1.791 m)   Wt 171 lb 3.2 oz (77.7 kg)   SpO2 97%   BMI 24.22 kg/m   Physical Exam:  Well appearing NAD HEENT: Unremarkable Neck:   No JVD, no thyromegally Lymphatics:  No adenopathy Back:  No CVA tenderness Lungs:  Clear with no wheezes HEART:  Regular rate rhythm, no murmurs, no rubs, no clicks Abd:  soft, positive bowel sounds, no organomegally, no rebound, no guarding Ext:  2 plus pulses, no  edema, no cyanosis, no clubbing Skin:  No rashes no nodules Neuro:  CN II through XII intact, motor grossly intact  EKG - NSR with biv pacing  DEVICE  Normal device function.  See PaceArt for details.   Assess/Plan: 1. Chronic systolic heart failure - he appears mostly euvolemic despite his dietary issues. I have strongly encouraged the patient to avoid salty food. 2. ICD - his medtronic DDD biv ICD is working normally. We will recheck in several months.  Kenneth Mills.D.

## 2018-09-25 NOTE — Patient Instructions (Signed)
Medication Instructions:  Your physician recommends that you continue on your current medications as directed. Please refer to the Current Medication list given to you today.  Labwork: None ordered.  Testing/Procedures: None ordered.  Follow-Up: Your physician wants you to follow-up in:  9 months with Dr. Lovena Le.   You will receive a reminder letter in the mail two months in advance. If you don't receive a letter, please call our office to schedule the follow-up appointment.  Remote monitoring is used to monitor your ICD from home. This monitoring reduces the number of office visits required to check your device to one time per year. It allows [place] to keep an eye on the functioning of your device to ensure it is working properly. You are scheduled for a device check from home on 12/25/2018. You may send your transmission at any time that day. If you have a wireless device, the transmission will be sent automatically. After your physician reviews your transmission, you will receive a postcard with your next transmission date.  Any Other Special Instructions Will Be Listed Below (If Applicable).  If you need a refill on your cardiac medications before your next appointment, please call your pharmacy.

## 2018-10-12 DIAGNOSIS — Z23 Encounter for immunization: Secondary | ICD-10-CM | POA: Diagnosis not present

## 2018-10-22 ENCOUNTER — Encounter: Payer: Self-pay | Admitting: Thoracic Surgery (Cardiothoracic Vascular Surgery)

## 2018-10-22 ENCOUNTER — Ambulatory Visit: Payer: Medicare HMO | Admitting: Thoracic Surgery (Cardiothoracic Vascular Surgery)

## 2018-10-22 VITALS — BP 132/75 | HR 67 | Resp 20 | Ht 70.0 in | Wt 175.0 lb

## 2018-10-22 DIAGNOSIS — I350 Nonrheumatic aortic (valve) stenosis: Secondary | ICD-10-CM | POA: Diagnosis not present

## 2018-10-22 DIAGNOSIS — Z953 Presence of xenogenic heart valve: Secondary | ICD-10-CM

## 2018-10-22 NOTE — Patient Instructions (Signed)

## 2018-10-22 NOTE — Progress Notes (Signed)
[address]       [place], [address]             [phone]     CARDIOTHORACIC SURGERY OFFICE NOTE  Referring Provider is Bensimhon, Shaune Pascal, MD PCP is Kieth Brightly   HPI:  Patient is a 67 year old male with aortic stenosis, nonischemic cardiomyopathy with chronic systolic just of heart failure, and COPD who returns to the office today for routine follow-up approximately 1 year status post aortic valve replacement using a stented bovine pericardial tissue valve on October 18, 2017.  Findings of surgery were notable for severe left ventricular systolic dysfunction with ejection fraction estimated less than 15%.  The patient's early postoperative recovery was uneventful although notable that he required inotropic support for several days.  He ultimately recovered uneventfully.  He was last seen here in our office for follow-up on April 23, 2018.  Since then he underwent ICD placement for primary prevention last August.  He has been followed carefully by Dr. Haroldine Laws.  He returns to our office today and reports that he is doing very well.  Several weeks ago he underwent elective hernia repair.  He has recovered from this uneventfully although he is still a bit sore.  His activity level is quite good.  He states that he only gets short of breath with more strenuous physical exertion.  He denies any exertional chest pain or chest tightness.  His weight is been stable and he is no longer taking any diuretics.  His defibrillator has not fired.   Current Outpatient Medications  Medication Sig Dispense Refill  . aspirin 81 MG tablet Take 1 tablet (81 mg total) by mouth daily.    Marland Kitchen atorvastatin (LIPITOR) 20 MG tablet Take 1 tablet (20 mg total) by mouth daily at 6 PM. 90 tablet 3  . clonazePAM (KLONOPIN) 0.5 MG tablet Take 0.5 mg by mouth daily as needed for anxiety.     . furosemide (LASIX) 20 MG tablet Take 1 tablet (20 mg total) by mouth as needed. (Patient  taking differently: Take 20 mg by mouth daily as needed for fluid. ) 30 tablet 5  . metoprolol succinate (TOPROL-XL) 25 MG 24 hr tablet Take 12.5 mg by mouth daily.    . Multiple Vitamins-Minerals (MULTIVITAMIN ADULT) CHEW Chew 1 each by mouth daily.    . sacubitril-valsartan (ENTRESTO) 49-51 MG Take 1 tablet by mouth 2 (two) times daily. 60 tablet 6  . spironolactone (ALDACTONE) 25 MG tablet Take 0.5 tablets (12.5 mg total) by mouth daily. 15 tablet 3   No current facility-administered medications for this visit.       Physical Exam:   BP 132/75   Pulse 67   Resp 20   Ht 5' 10"  (1.778 m)   Wt 175 lb (79.4 kg)   SpO2 99% Comment: RA  BMI 25.11 kg/m   General:  Well-appearing  Chest:   Clear to auscultation  CV:   Regular rate and rhythm without murmur  Incisions:  Completely healed  Abdomen:  Soft nontender  Extremities:  Warm and well-perfused  Diagnostic Tests:  n/a   Impression:  Patient is doing very well approximately 1 year status post aortic valve replacement using a stented bioprosthetic tissue valve.  He has underlying severe nonischemic cardiomyopathy but is clinically doing very well and he has been followed carefully by Dr. Haroldine Laws in the advanced heart failure clinic.  Plan:  We have not recommended any  change to the patient's current medications.  In the future he will call and return to see [place] only should specific problems or questions arise.  He will continue to follow-up regularly with Dr. Haroldine Laws.  The patient has been reminded regarding the importance of dental hygiene and the lifelong need for antibiotic prophylaxis for all dental cleanings and other related invasive procedures.   I spent in excess of 15 minutes during the conduct of this office consultation and >50% of this time involved direct face-to-face encounter with the patient for counseling and/or coordination of their care.    Valentina Gu. Roxy Manns, MD 10/22/2018 1:46 PM

## 2018-10-26 DIAGNOSIS — L570 Actinic keratosis: Secondary | ICD-10-CM | POA: Diagnosis not present

## 2018-11-08 DIAGNOSIS — J449 Chronic obstructive pulmonary disease, unspecified: Secondary | ICD-10-CM | POA: Diagnosis not present

## 2018-11-08 DIAGNOSIS — R0600 Dyspnea, unspecified: Secondary | ICD-10-CM | POA: Diagnosis not present

## 2018-11-15 ENCOUNTER — Encounter (HOSPITAL_COMMUNITY): Payer: Self-pay | Admitting: Internal Medicine

## 2018-11-15 ENCOUNTER — Ambulatory Visit (HOSPITAL_COMMUNITY)
Admission: RE | Admit: 2018-11-15 | Discharge: 2018-11-15 | Disposition: A | Discharge status: Home / Self Care | Payer: Medicare HMO | Source: Ambulatory Visit | Attending: Internal Medicine | Admitting: Internal Medicine

## 2018-11-15 VITALS — BP 132/78 | HR 88 | Wt 169.0 lb

## 2018-11-15 DIAGNOSIS — F17210 Nicotine dependence, cigarettes, uncomplicated: Secondary | ICD-10-CM | POA: Diagnosis not present

## 2018-11-15 DIAGNOSIS — N189 Chronic kidney disease, unspecified: Secondary | ICD-10-CM | POA: Insufficient documentation

## 2018-11-15 DIAGNOSIS — I2510 Atherosclerotic heart disease of native coronary artery without angina pectoris: Secondary | ICD-10-CM

## 2018-11-15 DIAGNOSIS — I130 Hypertensive heart and chronic kidney disease with heart failure and stage 1 through stage 4 chronic kidney disease, or unspecified chronic kidney disease: Secondary | ICD-10-CM | POA: Diagnosis not present

## 2018-11-15 DIAGNOSIS — I5022 Chronic systolic (congestive) heart failure: Secondary | ICD-10-CM | POA: Diagnosis not present

## 2018-11-15 DIAGNOSIS — R918 Other nonspecific abnormal finding of lung field: Secondary | ICD-10-CM | POA: Diagnosis not present

## 2018-11-15 DIAGNOSIS — I428 Other cardiomyopathies: Secondary | ICD-10-CM | POA: Insufficient documentation

## 2018-11-15 DIAGNOSIS — J449 Chronic obstructive pulmonary disease, unspecified: Secondary | ICD-10-CM | POA: Diagnosis not present

## 2018-11-15 DIAGNOSIS — M1990 Unspecified osteoarthritis, unspecified site: Secondary | ICD-10-CM | POA: Diagnosis not present

## 2018-11-15 DIAGNOSIS — Z7982 Long term (current) use of aspirin: Secondary | ICD-10-CM | POA: Diagnosis not present

## 2018-11-15 DIAGNOSIS — Z79899 Other long term (current) drug therapy: Secondary | ICD-10-CM | POA: Insufficient documentation

## 2018-11-15 DIAGNOSIS — Z9049 Acquired absence of other specified parts of digestive tract: Secondary | ICD-10-CM | POA: Insufficient documentation

## 2018-11-15 DIAGNOSIS — F419 Anxiety disorder, unspecified: Secondary | ICD-10-CM | POA: Diagnosis not present

## 2018-11-15 DIAGNOSIS — I447 Left bundle-branch block, unspecified: Secondary | ICD-10-CM | POA: Diagnosis not present

## 2018-11-15 DIAGNOSIS — Z953 Presence of xenogenic heart valve: Secondary | ICD-10-CM | POA: Diagnosis not present

## 2018-11-15 DIAGNOSIS — Z95810 Presence of automatic (implantable) cardiac defibrillator: Secondary | ICD-10-CM | POA: Insufficient documentation

## 2018-11-15 DIAGNOSIS — E7800 Pure hypercholesterolemia, unspecified: Secondary | ICD-10-CM | POA: Diagnosis not present

## 2018-11-15 DIAGNOSIS — Z8249 Family history of ischemic heart disease and other diseases of the circulatory system: Secondary | ICD-10-CM | POA: Insufficient documentation

## 2018-11-15 DIAGNOSIS — Z720 Tobacco use: Secondary | ICD-10-CM

## 2018-11-15 LAB — BASIC METABOLIC PANEL
Anion gap: 8 (ref 5–15)
BUN: 17 mg/dL (ref 8–23)
CO2: 20 mmol/L — ABNORMAL LOW (ref 22–32)
CREATININE: 0.9 mg/dL (ref 0.61–1.24)
Calcium: 9.3 mg/dL (ref 8.9–10.3)
Chloride: 111 mmol/L (ref 98–111)
GFR calc Af Amer: >60 mL/min (ref >60)
GFR calc non Af Amer: >60 mL/min (ref >60)
GLUCOSE: 132 mg/dL — AB (ref 70–99)
Potassium: 3.6 mmol/L (ref 3.5–5.1)
Sodium: 139 mmol/L (ref 135–145)

## 2018-11-15 MED ORDER — METOPROLOL SUCCINATE ER 25 MG PO TB24: 25.0000 mg | ORAL_TABLET | Freq: Every day | ORAL | 6 refills | Status: DC

## 2018-11-15 MED ORDER — SACUBITRIL-VALSARTAN 97-103 MG PO TABS: 1.0000 | ORAL_TABLET | Freq: Two times a day (BID) | ORAL | 6 refills | Status: DC

## 2018-11-15 NOTE — Patient Instructions (Addendum)
INCREASE Entresto to 97/119m (1 tab) twice a day  INCREASE Toprol XL 249m(1 tab) daily. Wait a week until Upper Respiratory Infection resolves before starting new dose.   Labs today We will only contact you if something comes back abnormal or we need to make some changes. Otherwise no news is good news!  Your physician recommends that you schedule a follow-up appointment in: 3-4 months with an echo  Your physician has requested that you have an echocardiogram. Echocardiography is a painless test that uses sound waves to create images of your heart. It provides your doctor with information about the size and shape of your heart and how well your heart's chambers and valves are working. This procedure takes approximately one hour. There are no restrictions for this procedure.

## 2018-11-15 NOTE — Addendum Note (Signed)
Encounter addended by: Valeda Malm, RN on: 11/15/2018 10:15 AM  Actions taken: Clinical Note Signed

## 2018-11-15 NOTE — Progress Notes (Signed)
Advanced Heart Failure Clinic Note   Primary Cardiologist: Dr. Haroldine Laws  EP: Dr Lovena Le  HPI: Kenneth Mills is a 68 y.o. male with h/o COPD, IBD s/p partial colectomy, Systolic CHF, and severe aortic stenosis s/p AVR 10/2017.  Admitted 09/01/17 with acute respiratory failure with parainfluenza and + PCT requiring intubation. Echo showed markedly depressed LV dysfunction and critical low-grade AS (new findings).   Cath showed non-obstructive disease, with marginal cardiac output, and severe AS.    TAVR team consulted and work up began. Pt had cardiac CTs and Carotid dopplers inpatient. PFTs and outpatient surgery follow up arranged.   Pt s/p AVR 10/18/2017. Required milrinone transiently that admission. Meds titrated as tolerated.   Referred to EP 02/22/2018 with Dr Lovena Le.  Underwent CRT-D implant 06/19/2018.  Had hernia surgery in 9/19  He presents today for regular HF follow up. Feeling ok. Recently had URI and is being treated with Levaquin. Denies CP. Not very active. Walks the dog without CP or undue SOB. No edema, orthopnea or PND. Back to smoking 1/2 ppd.     Review of systems complete and found to be negative unless listed in HPI.   PFTs 09/14/17 FVC-pre 4.13     (91%) FVC-post 3.85   (84%) FEV1-pre 2.53   (75%) FEV1-post 2.46 (72%) TLC 6.19 (88%) DLCO 17.26 (53%) (uncorrected)  ECHO 02/05/2018 EF 20-25%  Echo 09/02/17: EF 15% diffuse HK Normal RV. Critical low-gradient AS  RHC/LHC 09/05/2017  Prox RCA lesion, 50 %stenosed.  Mid RCA lesion, 30 %stenosed.  Mid LAD lesion, 30 %stenosed.  Mid Cx to Dist Cx lesion, 20 %stenosed.  Findings:  Ao = 96/67 (81) LV 140/19 RA = 4 RV = 31/7 PA = 28/13 (21) PCW = 14 Fick cardiac output/index =3.7/1.9 PVR = 1.9 WU Ao sat = 97% PA sat = 69%, 65%  Aortic valve Peak gradient 18mHG Mean gradient 24 mmHG  Past Medical History:  Diagnosis Date  . AICD (automatic cardioverter/defibrillator) present 06/19/2018    . Anxiety   . Aortic stenosis, severe    a. suspect low flow, low gradient severe AS.   .Marland KitchenArthritis   . Chronic kidney disease   . [facility] [place] [place] pulmonary disease) [place]   . Coronary artery disease   . Family history of adverse reaction to anesthesia    /son "passed out multiple times after anesthesia"  . Frequency of urination   . Heart murmur    "not since valve was replaced" (06/19/2018)  . High cholesterol   . Hypertension   . IBD (inflammatory bowel disease)    a. s/p partial colectomy in 2009  . Incidental pulmonary nodule, > 357mand < 8m56m1/11/2016   Several small nodules in left lung  . LBBB (left bundle branch block)   . NICM (nonischemic cardiomyopathy) ([facility]  a. 08/2014: EF 15% suspect to be endstage CM 2/2 severe AS  . Pneumonia 08/2017  . S/P aortic valve replacement with bioprosthetic valve 10/18/2017   [facility] bovine pericard[facility]  . Squamous carcinoma    ight ear  . Tobacco abuse    Current Outpatient Medications  Medication Sig Dispense Refill  . aspirin 81 MG tablet Take 1 tablet (81 mg total) by mouth daily.    . aMarland Kitchenorvastatin (LIPITOR) 20 MG tablet Take 1 tablet (20 mg total) by mouth daily at 6 PM. 90 tablet 3  . clonazePAM (KLONOPIN) 0.5 MG tablet Take 0.5 mg by mouth  daily as needed for anxiety.     . metoprolol succinate (TOPROL-XL) 25 MG 24 hr tablet Take 12.5 mg by mouth daily.    . Multiple Vitamins-Minerals (MULTIVITAMIN ADULT) CHEW Chew 1 each by mouth daily.    . sacubitril-valsartan (ENTRESTO) 49-51 MG Take 1 tablet by mouth 2 (two) times daily. 60 tablet 6  . furosemide (LASIX) 20 MG tablet Take 1 tablet (20 mg total) by mouth as needed. (Patient not taking: Reported on 11/15/2018) 30 tablet 5  . spironolactone (ALDACTONE) 25 MG tablet Take 0.5 tablets (12.5 mg total) by mouth daily. (Patient not taking: Reported on 11/15/2018) 15 tablet 3   No current facility-administered medications for this  encounter.    No Known Allergies  Social History   Socioeconomic History  . Marital status: Divorced    Spouse name: Not on file  . Number of children: Not on file  . Years of education: Not on file  . Highest education level: Not on file  Occupational History  . Not on file  Social Needs  . Financial resource strain: Not on file  . Food insecurity:    Worry: Not on file    Inability: Not on file  . Transportation needs:    Medical: Not on file    Non-medical: Not on file  Tobacco Use  . Smoking status: Current Some Day Smoker    Packs/day: 0.50    Years: 53.00    Pack years: 26.50    Types: Cigarettes    Start date: 68  . Smokeless tobacco: Former Systems developer    Types: Chew  Substance and Sexual Activity  . Alcohol use: Yes    Comment: 06/19/2018 "couple beers/year"  . Drug use: Not Currently    Types: Marijuana  . Sexual activity: Not on file  Lifestyle  . Physical activity:    Days per week: Not on file    Minutes per session: Not on file  . Stress: Not on file  Relationships  . Social connections:    Talks on phone: Not on file    Gets together: Not on file    Attends religious service: Not on file    Active member of club or organization: Not on file    Attends meetings of clubs or organizations: Not on file    Relationship status: Not on file  . Intimate partner violence:    Fear of current or ex partner: Not on file    Emotionally abused: Not on file    Physically abused: Not on file    Forced sexual activity: Not on file  Other Topics Concern  . Not on file  Social History Narrative  . Not on file   Family History  Problem Relation Age of Onset  . Hypertension Father   . Heart attack Father   . Lung cancer Brother    Vitals:   11/15/18 0941  BP: 132/78  Pulse: 88  SpO2: 98%  Weight: 76.7 kg (169 lb)   Wt Readings from Last 3 Encounters:  11/15/18 76.7 kg (169 lb)  10/22/18 79.4 kg (175 lb)  09/25/18 77.7 kg (171 lb 3.2 oz)    PHYSICAL  EXAM: General:  Well appearing but congested. No resp difficulty HEENT: normal Neck: supple. no JVD. Carotids 2+ bilat; no bruits. No lymphadenopathy or thryomegaly appreciated. Cor: PMI nondisplaced. Regular rate & rhythm. No rubs, gallops or murmurs. Lungs: clear with prolonged exp phase Abdomen: soft, nontender, nondistended. No hepatosplenomegaly. No bruits or masses. Good  bowel sounds. Extremities: no cyanosis, clubbing, rash, edema Neuro: alert & orientedx3, cranial nerves grossly intact. moves all 4 extremities w/o difficulty. Affect pleasant   ASSESSMENT & PLAN: 1. Chronic systolic CHF - Echo 70/34/03 LVEF 15%, RV normal, critical low gradient AS; TEE 12/18: EF 20-25%, normal RV, mild MR, trace TR - Most recent 02/2018 ECHO EF 20-25%. Now s/p CRT-D 06/19/18. - Volume status looks good - Continue lasix 36m prn  - Increase Entresto to 97/103 mg BID - Continue digoxin 0.125 mg daily.  - Increase Toprol XL to 25 mg Daily but wait until URI completely resolved - Continue 12.5 mg spiro.  - Due for repeat echo to reassess EF after CRT - Labs today  2. COPD - PFTS 09/14/18 better than expected with tobacco use.   - DLCO reduced. No change.   3. Low-gradient critical aortic stenosis s/p bioprosthetic AVR 10/18/2017 - Aware of need for dental prophylaxis.   4. Tobacco abuse - Reinforced need for cessation   5. CAD - No s/s ischemia. - Mild non-obstructive CAD with 50% proximal RCA with spasm on cath 09/06/17 - Continue atorvastatin 20 mg daily + ASA 81 mg daily  6. Pulmonary nodules.  - Scattered pulmonary nodules noted on CT 09/07/17.  -CT 03/28/2018 Improving pulmonary nodules with resolving inflammatory nodules.   7. LBBB - S/p CRT-D 06/19/2018   DGlori Bickers MD  9:54 AM

## 2018-11-15 NOTE — Addendum Note (Signed)
Encounter addended by: Valeda Malm, RN on: 11/15/2018 10:11 AM  Actions taken: Order list changed, Diagnosis association updated

## 2018-11-15 NOTE — Addendum Note (Signed)
Encounter addended by: Valeda Malm, RN on: 11/15/2018 10:10 AM  Actions taken: Order list changed, Diagnosis association updated, Clinical Note Signed

## 2018-12-06 ENCOUNTER — Telehealth (HOSPITAL_COMMUNITY): Payer: Self-pay | Admitting: Licensed Clinical Social Worker

## 2018-12-06 NOTE — Telephone Encounter (Signed)
Pt approved for continued assistance through [employer] for entresto copays:  Member ID: [id number] Group ID: [id number] RxBin ID: [id number] PCN: [id number] Eligibility Start Date: 11/10/2018 Eligibility End Date: 11/10/2019 Assistance Amount: $1,000.00  Jorge Ny, LCSW Clinical Social Worker Advanced [facility] Failure Clinic [phone]

## 2018-12-11 IMAGING — DX DG CHEST 1V PORT
1 series · 1 of 1 positions shown · non-contrast
Comparison: None.

CLINICAL DATA: Shortness of breath and fever

EXAM:
PORTABLE CHEST 1 VIEW

[chest ap]
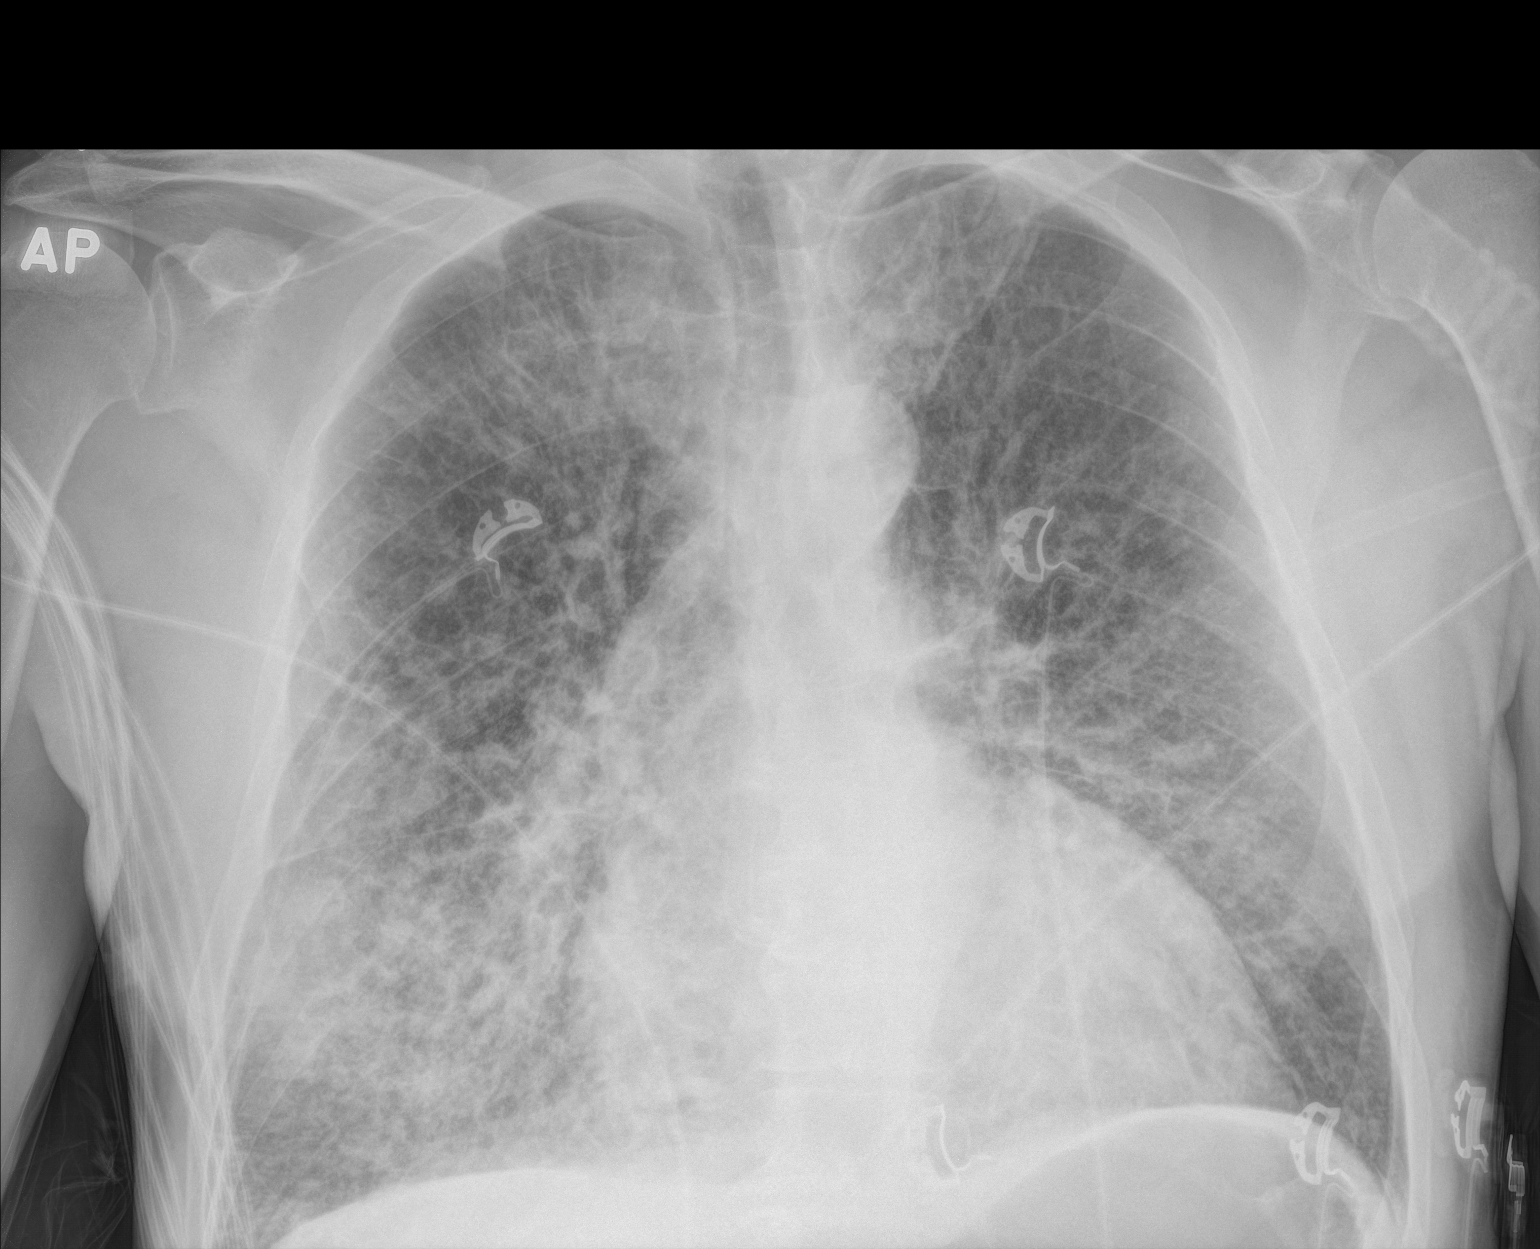

[1 of 1 positions shown; findings below may reference images not displayed]

FINDINGS: Cardiomegaly. Extensive diffuse interstitial and alveolar opacity
right greater than left. No pleural effusion. No pneumothorax.
IMPRESSION: 1. Extensive diffuse right greater than left interstitial and
alveolar opacity which may reflect pulmonary edema, diffuse
infection or combination of both
2. Cardiomegaly

## 2018-12-11 IMAGING — DX DG CHEST 1V PORT SAME DAY
1 series · 1 of 1 positions shown · non-contrast
Comparison: 09/01/2017

CLINICAL DATA: Post intubation

EXAM:
PORTABLE CHEST 1 VIEW

[chest ap]
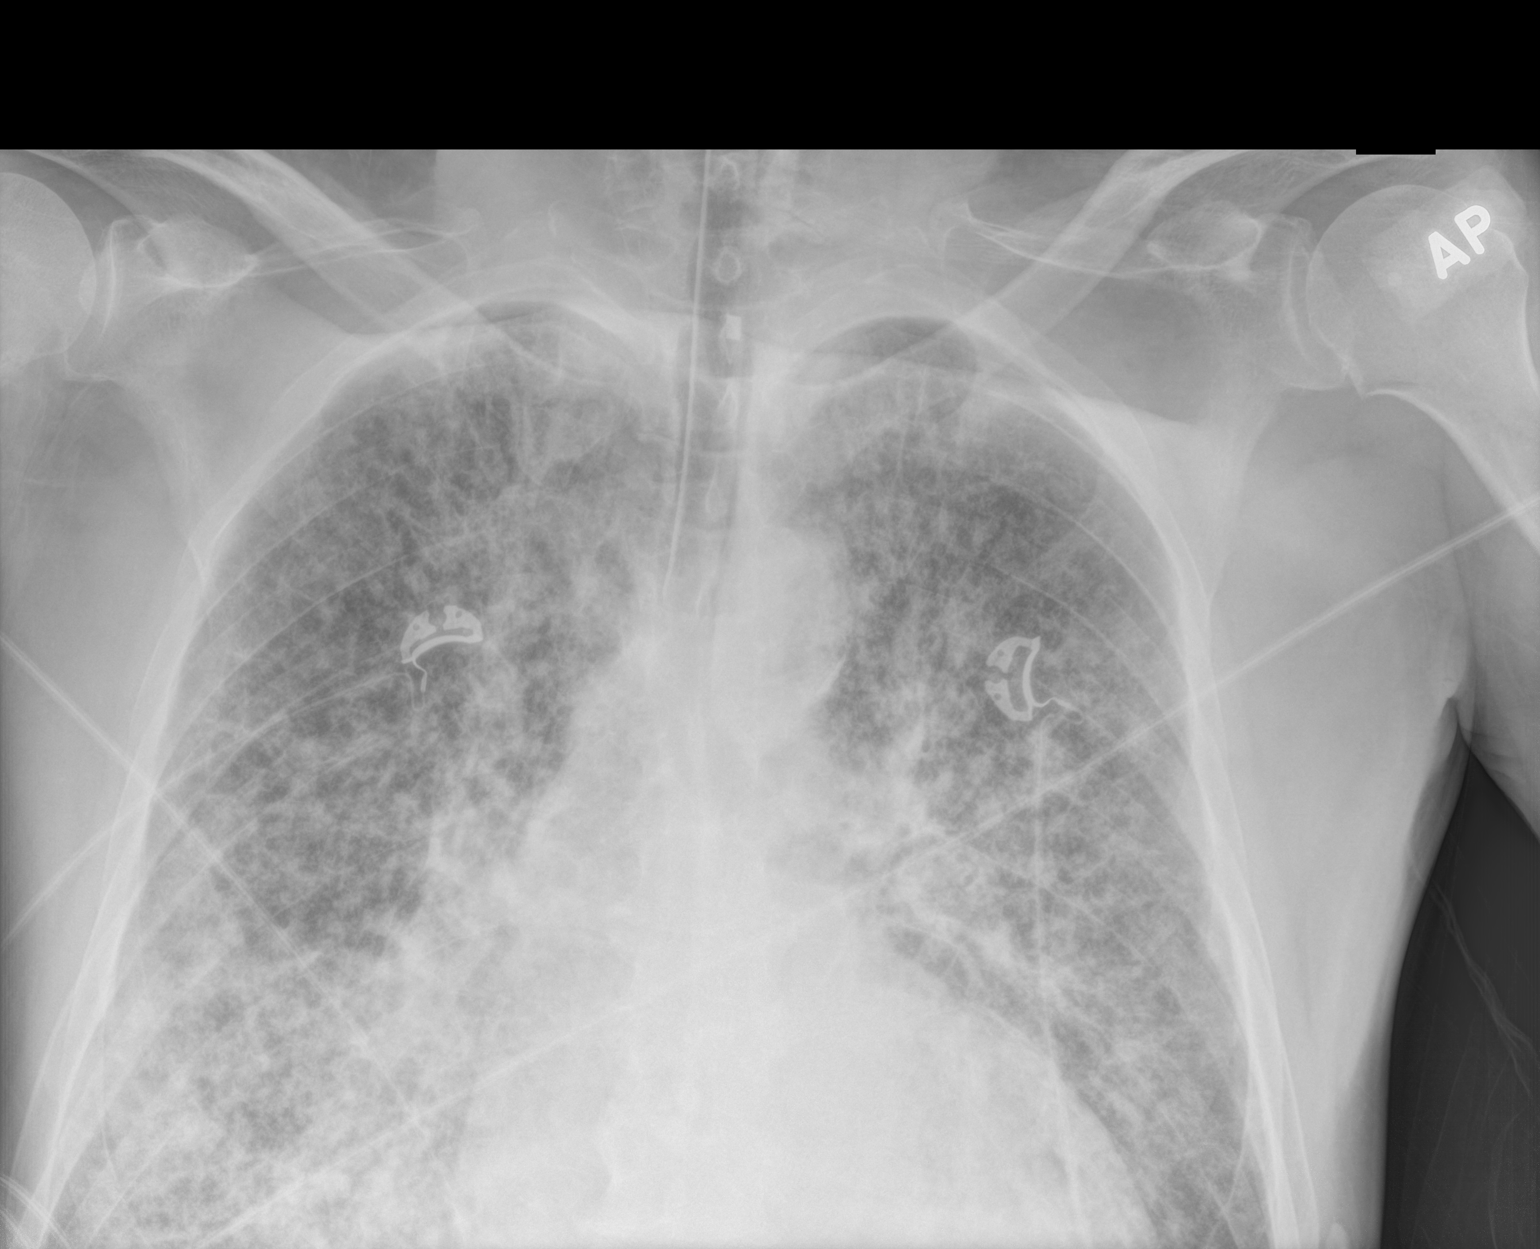

[1 of 1 positions shown; findings below may reference images not displayed]

FINDINGS: Interval placement of endotracheal tube, the tip is about 3.4 cm
superior to carina. Non inclusion of lung bases. Similar appearance
of extensive interstitial and alveolar opacity. Cardiomegaly
IMPRESSION: Endotracheal tube tip about 3.4 cm superior to carina. Similar
appearance of extensive interstitial and alveolar infiltrate or
edema

## 2018-12-12 IMAGING — CR DG ABD PORTABLE 1V
1 series · 1 of 1 positions shown · non-contrast
Comparison: None.

CLINICAL DATA: Orogastric tube placement.

EXAM:
PORTABLE ABDOMEN - 1 VIEW

[AP]
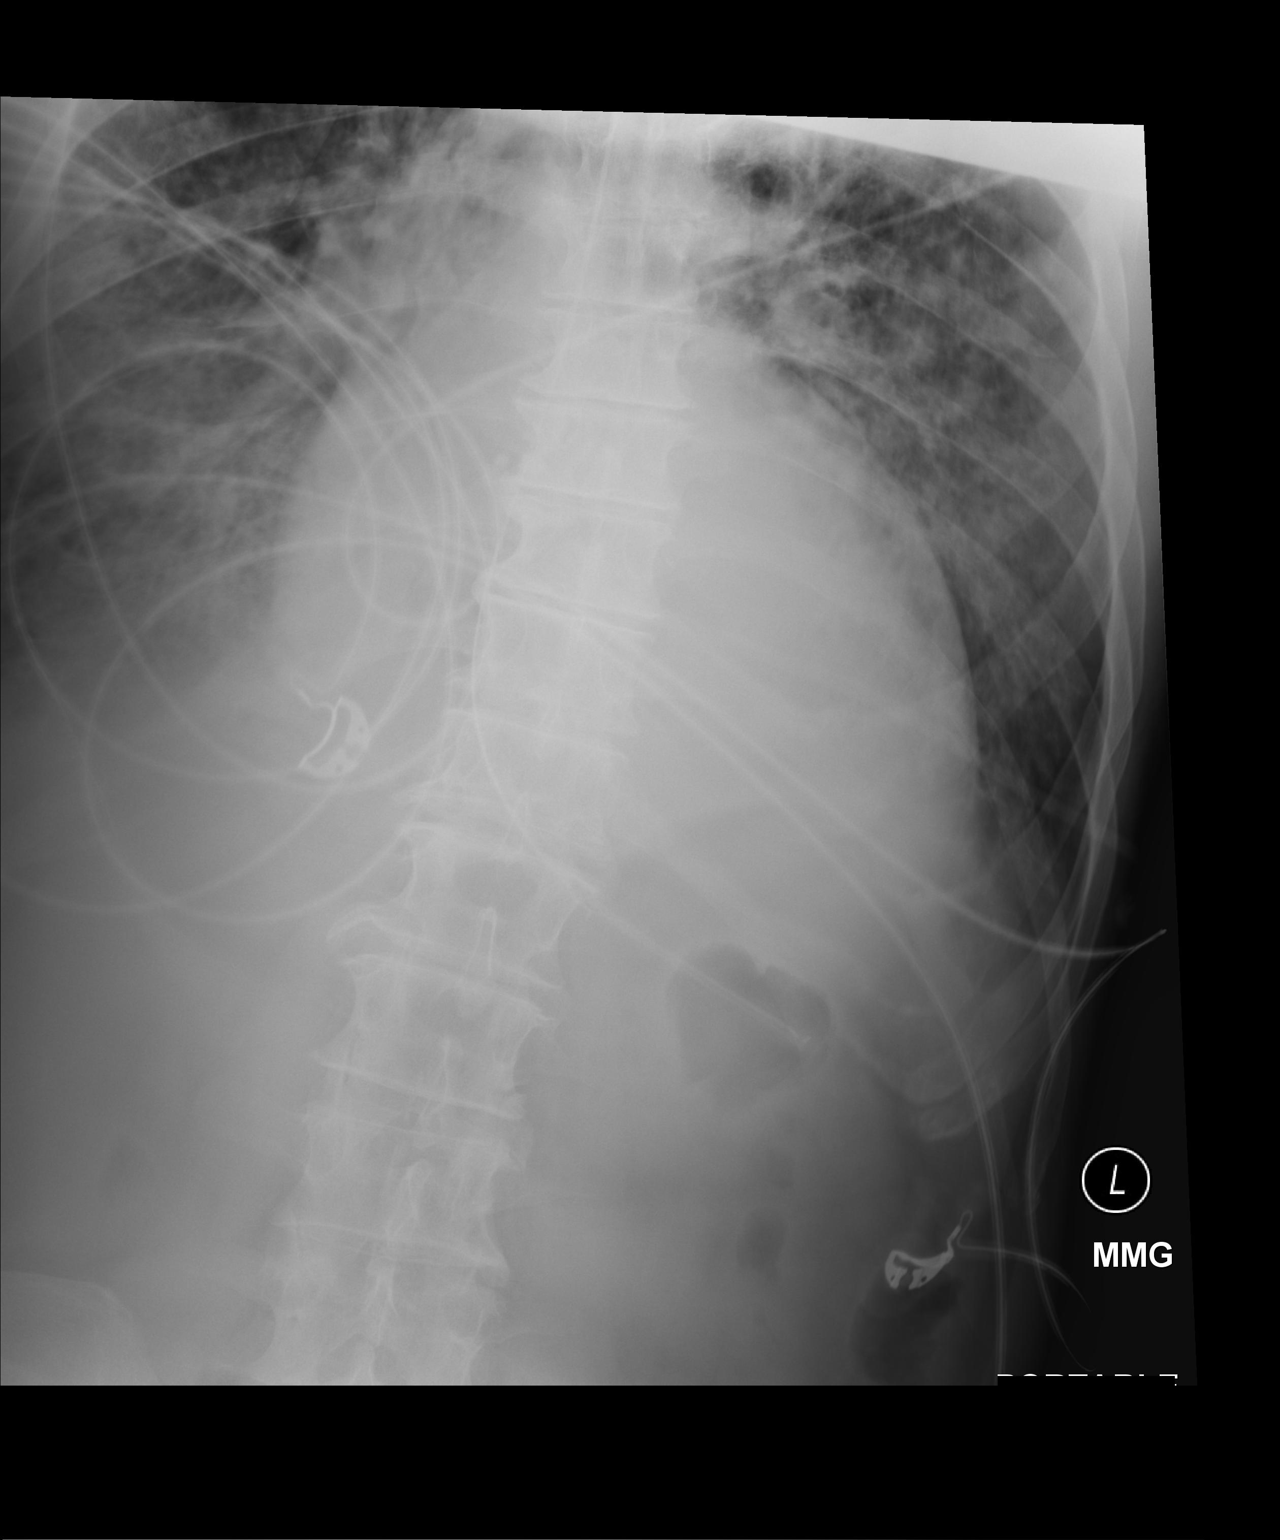

[1 of 1 positions shown; findings below may reference images not displayed]

FINDINGS: Limited abdominal radiograph for placement of nasogastric tube.
Nasogastric tube tip projects and proximal stomach. Paucity of bowel
gas. No mass effect or pathologic calcifications in the included
abdomen. Cardiomegaly and interstitial prominence with pleural
effusions and included chest. Soft tissue planes and included
osseous structures are nonsuspicious.
IMPRESSION: Nasogastric tube tip projects in proximal stomach.

## 2018-12-13 IMAGING — DX DG CHEST 1V PORT
1 series · 1 of 1 positions shown · non-contrast
Comparison: Earlier film of the same day

CLINICAL DATA: S/P PICC central line placement. Image reviewed by
Dr. Justen. Hx of COPD. Pt is a current smoker.

EXAM:
PORTABLE CHEST - 1 VIEW

[chest ap]
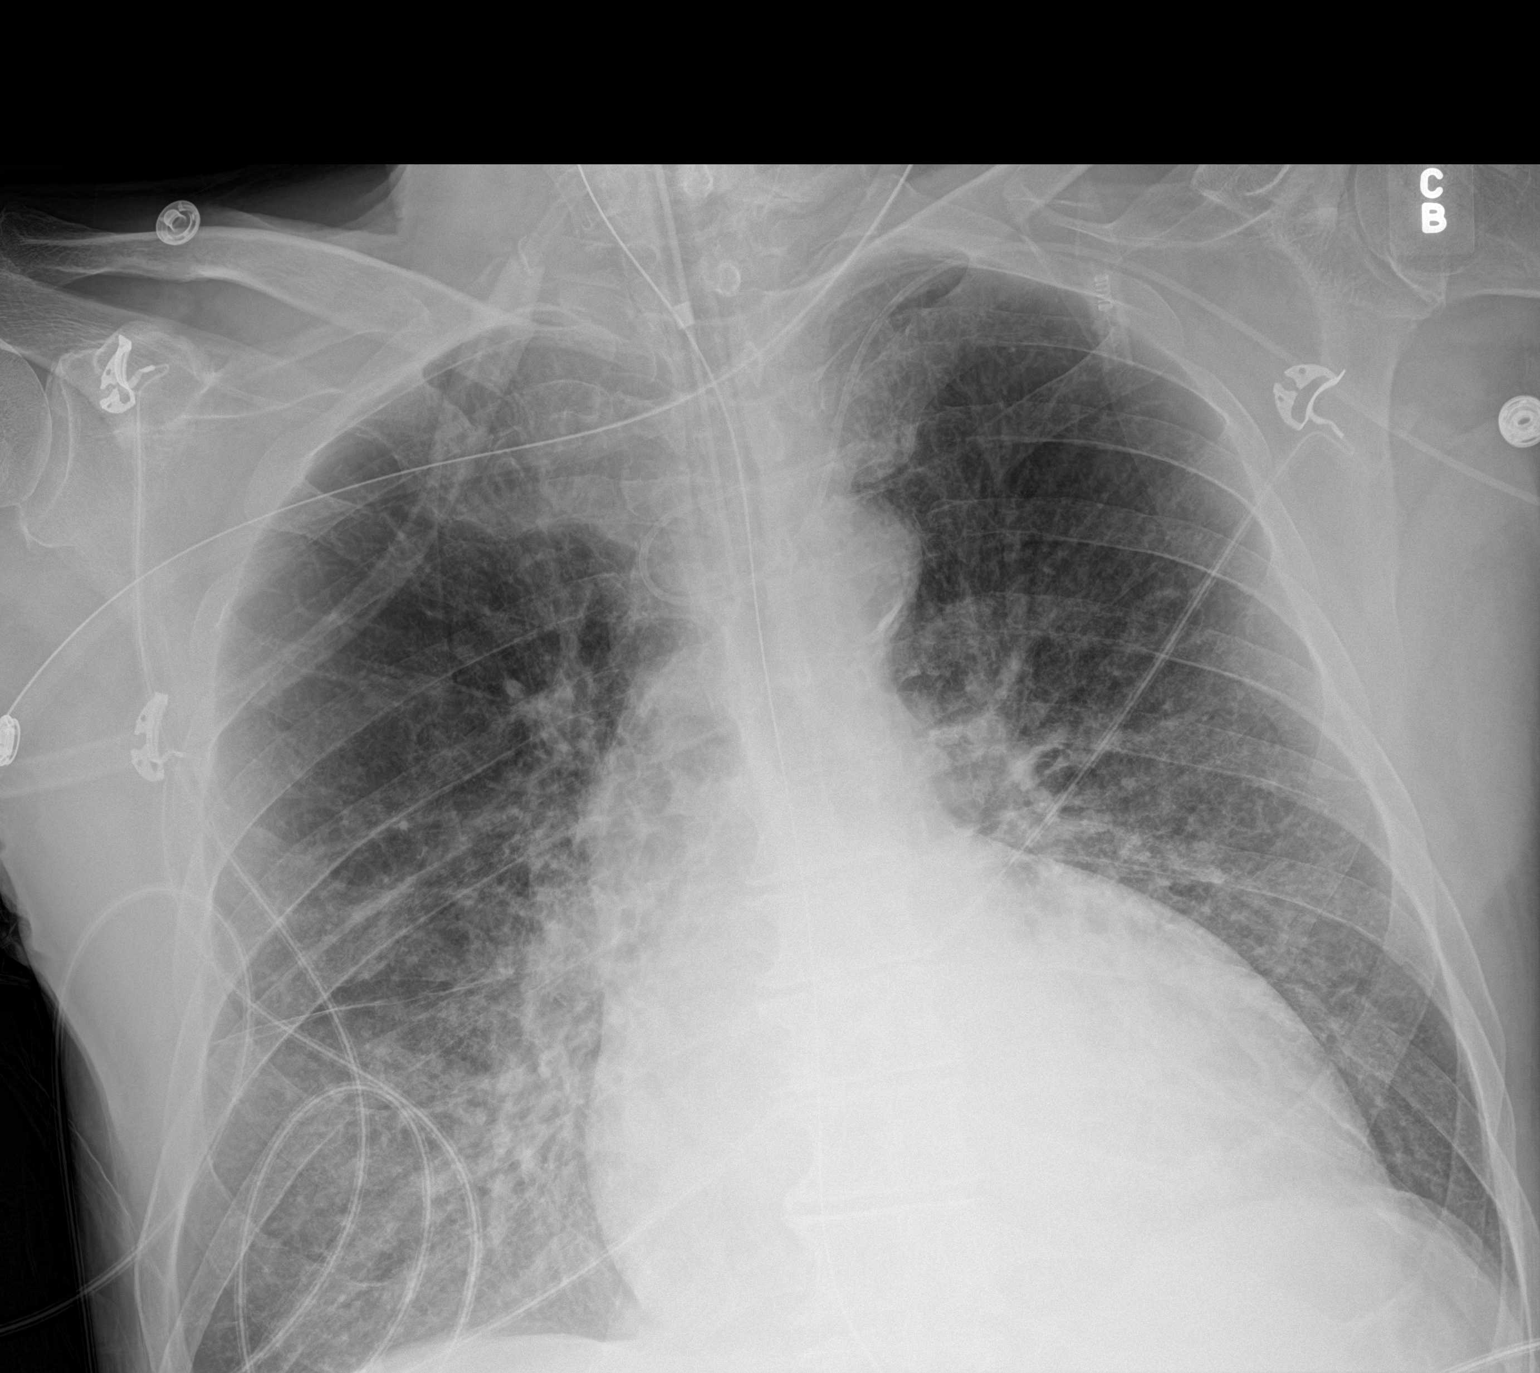

[1 of 1 positions shown; findings below may reference images not displayed]

FINDINGS: Left arm PICC line loops in the innominate vein.

Endotracheal tube and nasogastric tube are stable in position.

Heart size upper limits normal.  Atheromatous aorta.

Some improvement in the perihilar and bibasilar interstitial and
airspace edema or infiltrates.

Right costophrenic angle is excluded.
IMPRESSION: 1. Malpositioned PICC, looped in the left innominate vein.
2. Some interval improvement in bilateral edema or infiltrates.

## 2018-12-14 IMAGING — DX DG CHEST 1V PORT
1 series · 2 of 2 positions shown · non-contrast
Comparison: Portable chest x-ray September 03, 2017

CLINICAL DATA: Respiratory failure, COPD, current smoker, intubated
patient.

EXAM:
PORTABLE CHEST 1 VIEW

[Series 1: chest ap · 0.14mm/px · 2 of 2 slices shown]
[im 1/2]
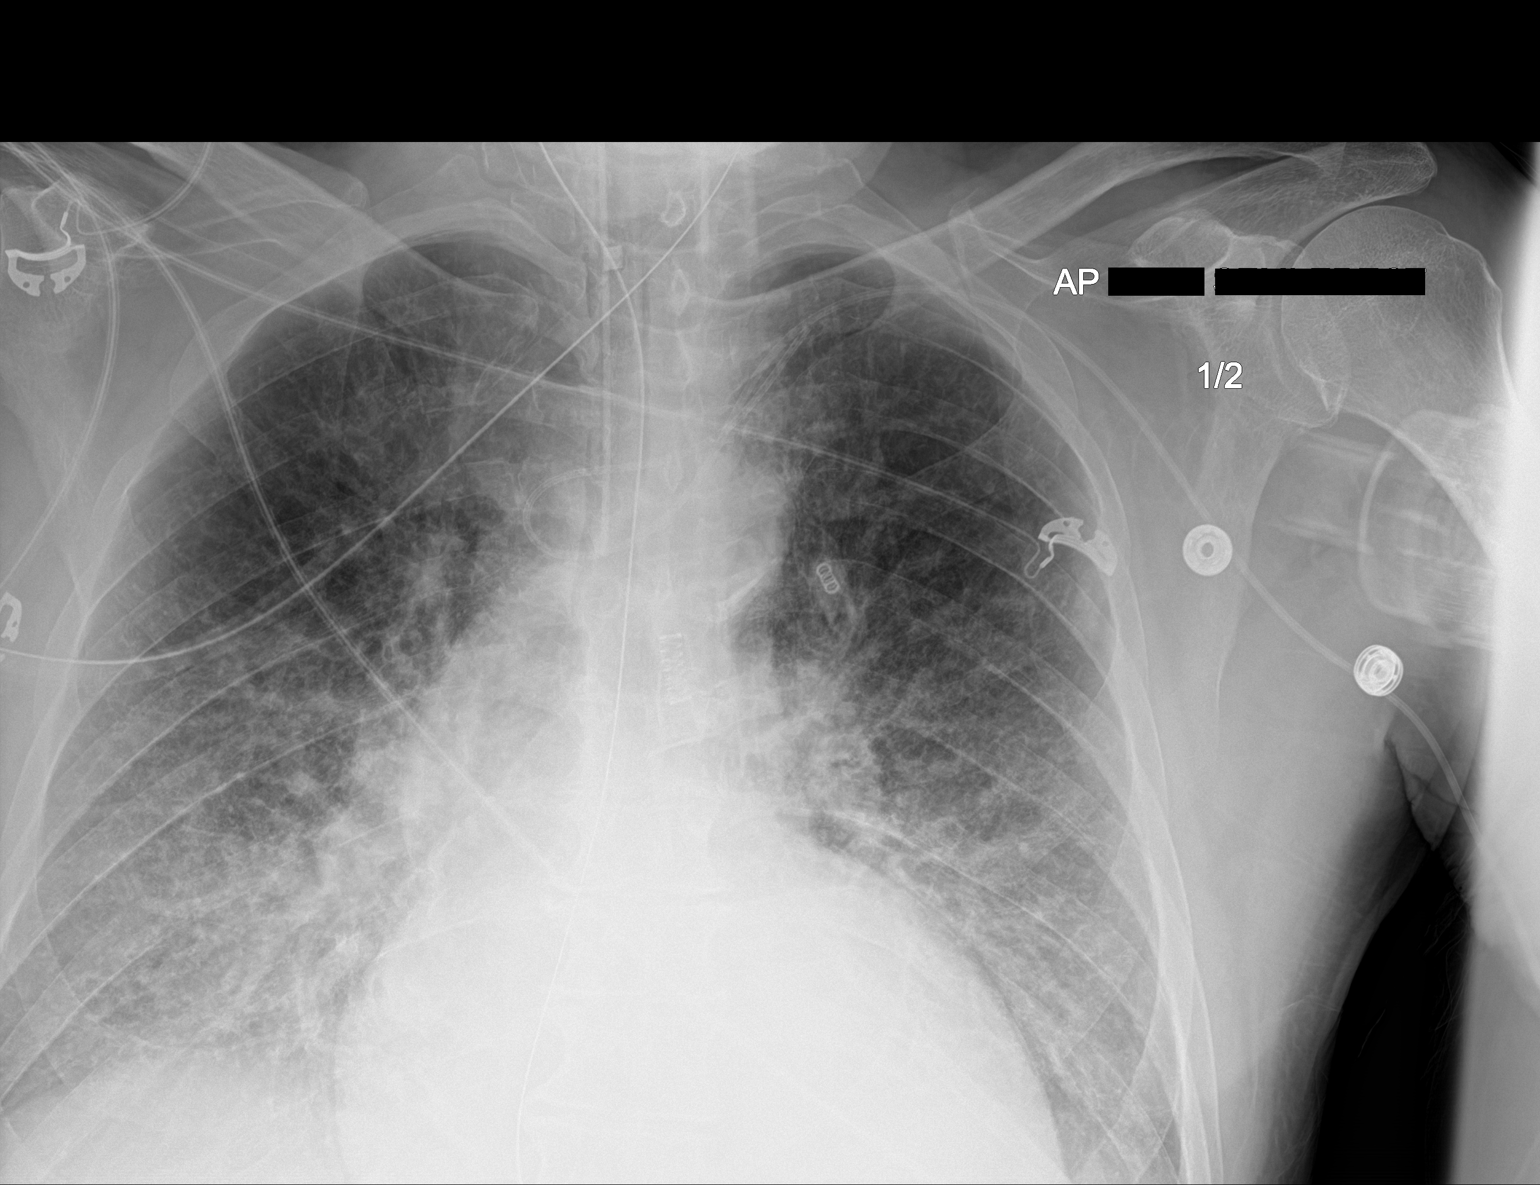
[im 2/2]
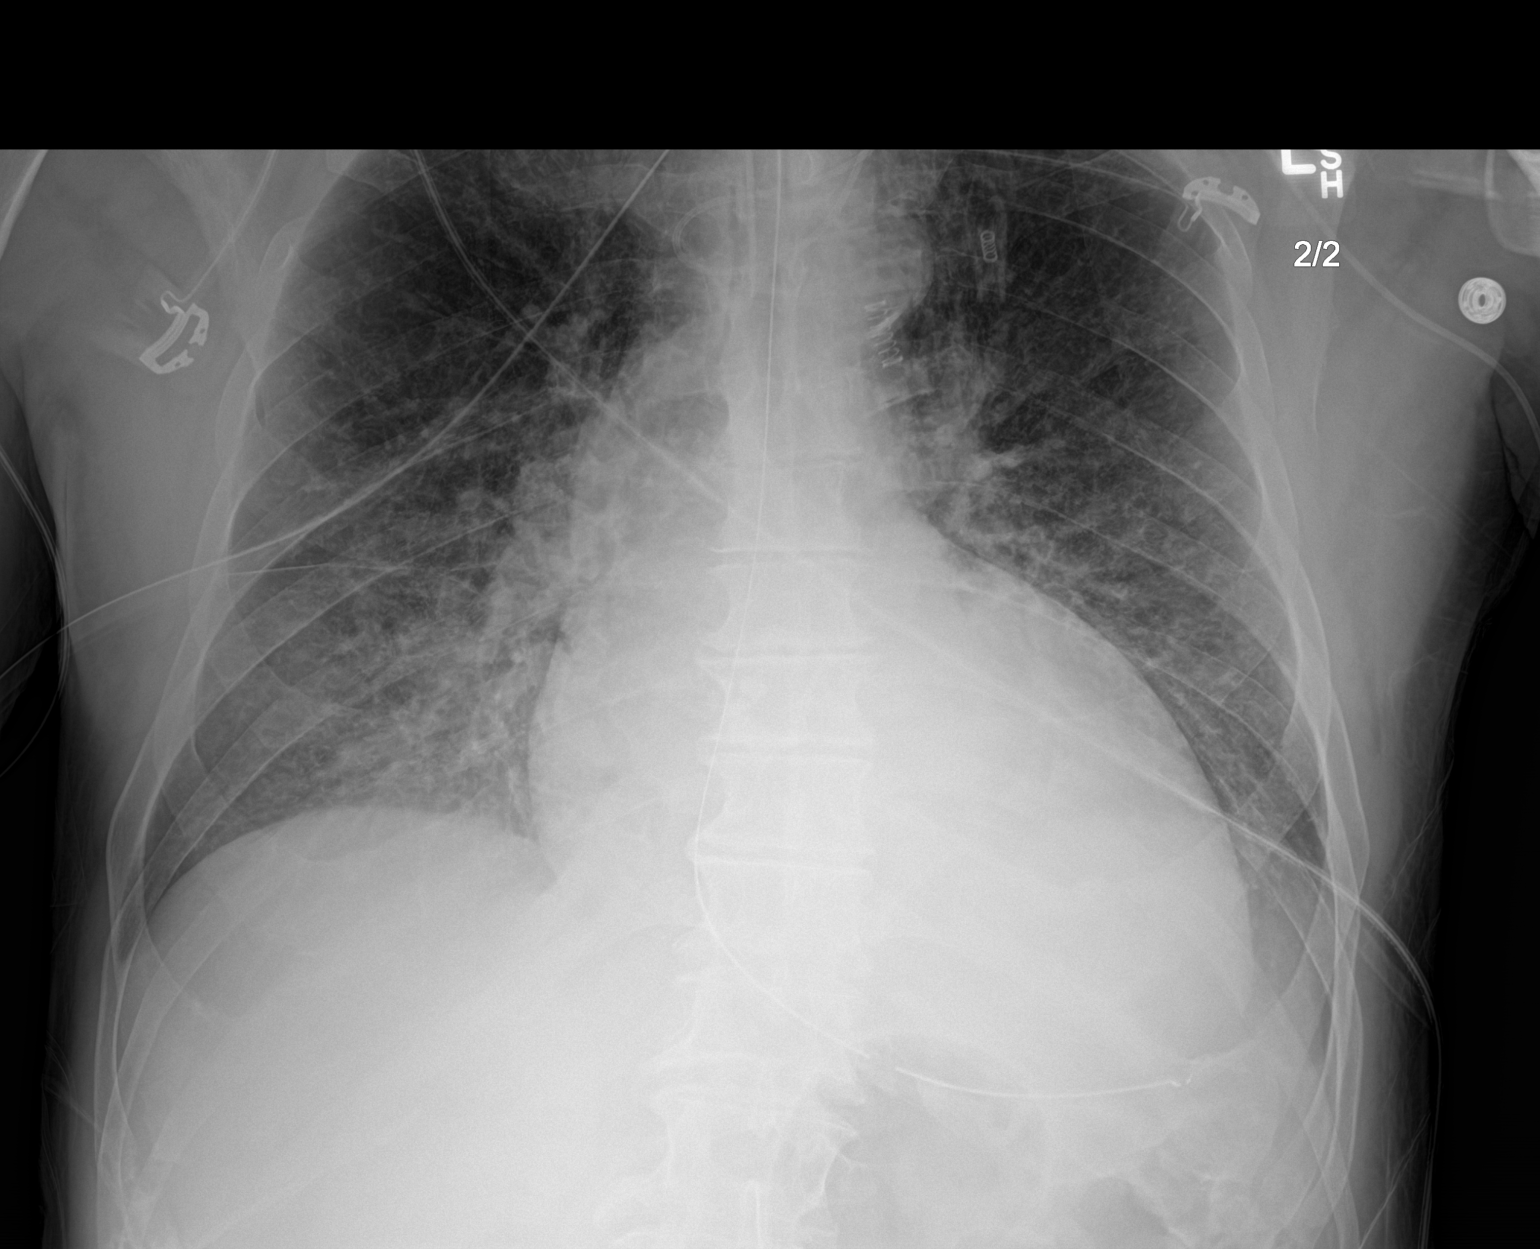

[2 of 2 positions shown; findings below may reference images not displayed]

FINDINGS: The lungs are well-expanded. The interstitial markings remain
increased. The cardiac silhouette remains enlarged and the pulmonary
vascularity mildly engorged. The retrocardiac region on the left
remains dense. There is a small left pleural effusion. Slightly
increased density is noted medially at the right lung base. There is
no pneumothorax. There is calcification in the wall of the aortic
arch. The endotracheal tube tip lies 3.5 cm above the carina. The
esophagogastric tube tip lies in the gastric cardia. The proximal
port lies at the GE junction.
IMPRESSION: COPD with superimposed CHF. Left lower lobe atelectasis or pneumonia
with possible early similar changes at the right base. Small left
pleural effusion.

Advancement of the esophagogastric tube by 5-10 cm would assure that
the proximal port is indeed below the GE junction.

Thoracic aortic atheroscleroses.

## 2018-12-25 ENCOUNTER — Ambulatory Visit (INDEPENDENT_AMBULATORY_CARE_PROVIDER_SITE_OTHER): Payer: Medicare HMO

## 2018-12-25 DIAGNOSIS — I428 Other cardiomyopathies: Secondary | ICD-10-CM | POA: Diagnosis not present

## 2018-12-26 LAB — CUP PACEART REMOTE DEVICE CHECK
Battery Remaining Longevity: 88 mo
Battery Voltage: 2.99 V
Brady Statistic AP VS Percent: 0.02 %
Brady Statistic RA Percent Paced: 11.77 %
Brady Statistic RV Percent Paced: 99.57 %
HighPow Impedance: 72 Ohm
Implantable Lead Location: 753859
Implantable Lead Location: 753860
Implantable Lead Model: 4598
Implantable Lead Model: 5076
Lead Channel Impedance Value: 212.8 Ohm
Lead Channel Impedance Value: 230.327
Lead Channel Impedance Value: 241.412
Lead Channel Impedance Value: 399 Ohm
Lead Channel Impedance Value: 418 Ohm
Lead Channel Impedance Value: 456 Ohm
Lead Channel Impedance Value: 475 Ohm
Lead Channel Impedance Value: 513 Ohm
Lead Channel Impedance Value: 703 Ohm
Lead Channel Impedance Value: 779 Ohm
Lead Channel Impedance Value: 779 Ohm
Lead Channel Pacing Threshold Amplitude: 0.5 V
Lead Channel Pacing Threshold Amplitude: 0.5 V
Lead Channel Pacing Threshold Pulse Width: 0.4 ms
Lead Channel Sensing Intrinsic Amplitude: 2.25 mV
Lead Channel Sensing Intrinsic Amplitude: 2.25 mV
Lead Channel Sensing Intrinsic Amplitude: 5.625 mV
Lead Channel Setting Pacing Amplitude: 1.75 V
Lead Channel Setting Pacing Amplitude: 2.5 V
Lead Channel Setting Pacing Pulse Width: 0.4 ms
Lead Channel Setting Sensing Sensitivity: 0.3 mV
MDC IDC LEAD IMPLANT DT: 20190813
MDC IDC LEAD IMPLANT DT: 20190813
MDC IDC LEAD IMPLANT DT: 20190813
MDC IDC LEAD LOCATION: 753858
MDC IDC MSMT LEADCHNL LV IMPEDANCE VALUE: 204.14 Ohm
MDC IDC MSMT LEADCHNL LV IMPEDANCE VALUE: 224.438
MDC IDC MSMT LEADCHNL LV IMPEDANCE VALUE: 532 Ohm
MDC IDC MSMT LEADCHNL LV IMPEDANCE VALUE: 703 Ohm
MDC IDC MSMT LEADCHNL LV IMPEDANCE VALUE: 760 Ohm
MDC IDC MSMT LEADCHNL LV PACING THRESHOLD AMPLITUDE: 1 V
MDC IDC MSMT LEADCHNL RA IMPEDANCE VALUE: 456 Ohm
MDC IDC MSMT LEADCHNL RA PACING THRESHOLD PULSEWIDTH: 0.4 ms
MDC IDC MSMT LEADCHNL RV IMPEDANCE VALUE: 551 Ohm
MDC IDC MSMT LEADCHNL RV PACING THRESHOLD PULSEWIDTH: 0.4 ms
MDC IDC MSMT LEADCHNL RV SENSING INTR AMPL: 5.625 mV
MDC IDC PG IMPLANT DT: 20190813
MDC IDC SESS DTM: 20200218072603
MDC IDC SET LEADCHNL LV PACING PULSEWIDTH: 0.4 ms
MDC IDC SET LEADCHNL RA PACING AMPLITUDE: 1.5 V
MDC IDC STAT BRADY AP VP PERCENT: 11.79 %
MDC IDC STAT BRADY AS VP PERCENT: 88.01 %
MDC IDC STAT BRADY AS VS PERCENT: 0.19 %

## 2019-01-02 NOTE — Progress Notes (Signed)
Remote ICD transmission.   

## 2019-01-07 ENCOUNTER — Encounter: Payer: Self-pay | Admitting: Cardiology

## 2019-03-26 ENCOUNTER — Other Ambulatory Visit: Payer: Self-pay

## 2019-03-26 ENCOUNTER — Ambulatory Visit (INDEPENDENT_AMBULATORY_CARE_PROVIDER_SITE_OTHER): Payer: Medicare HMO | Admitting: *Deleted

## 2019-03-26 DIAGNOSIS — I428 Other cardiomyopathies: Secondary | ICD-10-CM | POA: Diagnosis not present

## 2019-03-27 DIAGNOSIS — L821 Other seborrheic keratosis: Secondary | ICD-10-CM | POA: Diagnosis not present

## 2019-03-27 DIAGNOSIS — D485 Neoplasm of uncertain behavior of skin: Secondary | ICD-10-CM | POA: Diagnosis not present

## 2019-03-27 DIAGNOSIS — L814 Other melanin hyperpigmentation: Secondary | ICD-10-CM | POA: Diagnosis not present

## 2019-03-27 DIAGNOSIS — A630 Anogenital (venereal) warts: Secondary | ICD-10-CM | POA: Diagnosis not present

## 2019-03-27 DIAGNOSIS — L578 Other skin changes due to chronic exposure to nonionizing radiation: Secondary | ICD-10-CM | POA: Diagnosis not present

## 2019-03-27 DIAGNOSIS — C44329 Squamous cell carcinoma of skin of other parts of face: Secondary | ICD-10-CM | POA: Diagnosis not present

## 2019-03-27 DIAGNOSIS — C44529 Squamous cell carcinoma of skin of other part of trunk: Secondary | ICD-10-CM | POA: Diagnosis not present

## 2019-03-27 LAB — CUP PACEART REMOTE DEVICE CHECK
Battery Remaining Longevity: 80 mo
Battery Voltage: 2.97 V
Brady Statistic AP VP Percent: 13.28 %
Brady Statistic AP VS Percent: 0.02 %
Brady Statistic AS VP Percent: 86.67 %
Brady Statistic AS VS Percent: 0.03 %
Brady Statistic RA Percent Paced: 13.29 %
Brady Statistic RV Percent Paced: 99.8 %
Date Time Interrogation Session: 20200519113911
HighPow Impedance: 64 Ohm
Implantable Lead Implant Date: 20190813
Implantable Lead Implant Date: 20190813
Implantable Lead Implant Date: 20190813
Implantable Lead Location: 753858
Implantable Lead Location: 753859
Implantable Lead Location: 753860
Implantable Lead Model: 4598
Implantable Lead Model: 5076
Implantable Lead Model: 6935
Implantable Pulse Generator Implant Date: 20190813
Lead Channel Impedance Value: 165.029
Lead Channel Impedance Value: 172.541
Lead Channel Impedance Value: 176 Ohm
Lead Channel Impedance Value: 193.707
Lead Channel Impedance Value: 204.14 Ohm
Lead Channel Impedance Value: 304 Ohm
Lead Channel Impedance Value: 361 Ohm
Lead Channel Impedance Value: 399 Ohm
Lead Channel Impedance Value: 418 Ohm
Lead Channel Impedance Value: 418 Ohm
Lead Channel Impedance Value: 418 Ohm
Lead Channel Impedance Value: 475 Ohm
Lead Channel Impedance Value: 475 Ohm
Lead Channel Impedance Value: 589 Ohm
Lead Channel Impedance Value: 589 Ohm
Lead Channel Impedance Value: 608 Ohm
Lead Channel Impedance Value: 665 Ohm
Lead Channel Impedance Value: 665 Ohm
Lead Channel Pacing Threshold Amplitude: 0.5 V
Lead Channel Pacing Threshold Amplitude: 0.75 V
Lead Channel Pacing Threshold Amplitude: 1.5 V
Lead Channel Pacing Threshold Pulse Width: 0.4 ms
Lead Channel Pacing Threshold Pulse Width: 0.4 ms
Lead Channel Pacing Threshold Pulse Width: 0.4 ms
Lead Channel Sensing Intrinsic Amplitude: 1.25 mV
Lead Channel Sensing Intrinsic Amplitude: 1.25 mV
Lead Channel Sensing Intrinsic Amplitude: 5.625 mV
Lead Channel Sensing Intrinsic Amplitude: 5.625 mV
Lead Channel Setting Pacing Amplitude: 1.5 V
Lead Channel Setting Pacing Amplitude: 2 V
Lead Channel Setting Pacing Amplitude: 2.5 V
Lead Channel Setting Pacing Pulse Width: 0.4 ms
Lead Channel Setting Pacing Pulse Width: 0.4 ms
Lead Channel Setting Sensing Sensitivity: 0.3 mV

## 2019-04-03 DIAGNOSIS — L570 Actinic keratosis: Secondary | ICD-10-CM | POA: Diagnosis not present

## 2019-04-04 ENCOUNTER — Encounter: Payer: Self-pay | Admitting: Cardiology

## 2019-04-04 NOTE — Progress Notes (Signed)
Remote ICD transmission.   

## 2019-04-11 DIAGNOSIS — L570 Actinic keratosis: Secondary | ICD-10-CM | POA: Diagnosis not present

## 2019-05-20 DIAGNOSIS — Z1211 Encounter for screening for malignant neoplasm of colon: Secondary | ICD-10-CM | POA: Diagnosis not present

## 2019-05-20 DIAGNOSIS — F419 Anxiety disorder, unspecified: Secondary | ICD-10-CM | POA: Diagnosis not present

## 2019-06-15 ENCOUNTER — Other Ambulatory Visit (HOSPITAL_COMMUNITY): Payer: Self-pay | Admitting: Internal Medicine

## 2019-06-25 ENCOUNTER — Encounter: Payer: Medicare HMO | Admitting: *Deleted

## 2019-06-25 LAB — CUP PACEART REMOTE DEVICE CHECK
Battery Remaining Longevity: 76 mo
Battery Voltage: 2.97 V
Brady Statistic AP VP Percent: 17.29 %
Brady Statistic AP VS Percent: 0.03 %
Brady Statistic AS VP Percent: 82.52 %
Brady Statistic AS VS Percent: 0.16 %
Brady Statistic RA Percent Paced: 17.26 %
Brady Statistic RV Percent Paced: 99.41 %
Date Time Interrogation Session: 20200818052406
HighPow Impedance: 71 Ohm
Implantable Lead Implant Date: 20190813
Implantable Lead Implant Date: 20190813
Implantable Lead Implant Date: 20190813
Implantable Lead Location: 753858
Implantable Lead Location: 753859
Implantable Lead Location: 753860
Implantable Lead Model: 4598
Implantable Lead Model: 5076
Implantable Lead Model: 6935
Implantable Pulse Generator Implant Date: 20190813
Lead Channel Impedance Value: 189.525
Lead Channel Impedance Value: 189.525
Lead Channel Impedance Value: 201.488
Lead Channel Impedance Value: 212.8 Ohm
Lead Channel Impedance Value: 212.8 Ohm
Lead Channel Impedance Value: 361 Ohm
Lead Channel Impedance Value: 399 Ohm
Lead Channel Impedance Value: 399 Ohm
Lead Channel Impedance Value: 399 Ohm
Lead Channel Impedance Value: 399 Ohm
Lead Channel Impedance Value: 456 Ohm
Lead Channel Impedance Value: 456 Ohm
Lead Channel Impedance Value: 475 Ohm
Lead Channel Impedance Value: 646 Ohm
Lead Channel Impedance Value: 665 Ohm
Lead Channel Impedance Value: 722 Ohm
Lead Channel Impedance Value: 722 Ohm
Lead Channel Impedance Value: 760 Ohm
Lead Channel Pacing Threshold Amplitude: 0.5 V
Lead Channel Pacing Threshold Amplitude: 0.625 V
Lead Channel Pacing Threshold Amplitude: 1.625 V
Lead Channel Pacing Threshold Pulse Width: 0.4 ms
Lead Channel Pacing Threshold Pulse Width: 0.4 ms
Lead Channel Pacing Threshold Pulse Width: 0.4 ms
Lead Channel Sensing Intrinsic Amplitude: 1.375 mV
Lead Channel Sensing Intrinsic Amplitude: 1.375 mV
Lead Channel Sensing Intrinsic Amplitude: 10.75 mV
Lead Channel Sensing Intrinsic Amplitude: 10.75 mV
Lead Channel Setting Pacing Amplitude: 1.5 V
Lead Channel Setting Pacing Amplitude: 2.5 V
Lead Channel Setting Pacing Amplitude: 2.5 V
Lead Channel Setting Pacing Pulse Width: 0.4 ms
Lead Channel Setting Pacing Pulse Width: 0.4 ms
Lead Channel Setting Sensing Sensitivity: 0.3 mV

## 2019-07-12 ENCOUNTER — Other Ambulatory Visit (HOSPITAL_COMMUNITY): Payer: Self-pay | Admitting: Internal Medicine

## 2019-08-05 ENCOUNTER — Other Ambulatory Visit: Payer: Self-pay

## 2019-08-05 ENCOUNTER — Ambulatory Visit: Payer: Medicare HMO | Admitting: Internal Medicine

## 2019-08-05 ENCOUNTER — Encounter: Payer: Self-pay | Admitting: Internal Medicine

## 2019-08-05 VITALS — BP 120/62 | HR 77 | Ht 70.0 in | Wt 173.6 lb

## 2019-08-05 DIAGNOSIS — Z95810 Presence of automatic (implantable) cardiac defibrillator: Secondary | ICD-10-CM | POA: Diagnosis not present

## 2019-08-05 DIAGNOSIS — I5022 Chronic systolic (congestive) heart failure: Secondary | ICD-10-CM | POA: Diagnosis not present

## 2019-08-05 DIAGNOSIS — I447 Left bundle-branch block, unspecified: Secondary | ICD-10-CM | POA: Diagnosis not present

## 2019-08-05 NOTE — Patient Instructions (Signed)
Medication Instructions:  Your physician recommends that you continue on your current medications as directed. Please refer to the Current Medication list given to you today.  Labwork: None ordered.  Testing/Procedures: None ordered.  Follow-Up: Your physician wants you to follow-up in: one year with Dr. Lovena Le.   You will receive a reminder letter in the mail two months in advance. If you don't receive a letter, please call our office to schedule the follow-up appointment.  Remote monitoring is used to monitor your ICD from home. This monitoring reduces the number of office visits required to check your device to one time per year. It allows [place] to keep an eye on the functioning of your device to ensure it is working properly. You are scheduled for a device check from home on 11/04/2019. You may send your transmission at any time that day. If you have a wireless device, the transmission will be sent automatically. After your physician reviews your transmission, you will receive a postcard with your next transmission date.  Any Other Special Instructions Will Be Listed Below (If Applicable).  If you need a refill on your cardiac medications before your next appointment, please call your pharmacy.

## 2019-08-05 NOTE — Progress Notes (Signed)
HPI [name] returns today for followup. He has severe LV dysfunction and class 2 CHF and underwent ICD insertion several months ago. He has class 2 symptoms but remains active walking his dog. He admits to dietary indiscretion. He drinks a pot of coffee in the morning. He denies chest pain, sob, or ICD therapies.  No Known Allergies   Current Outpatient Medications  Medication Sig Dispense Refill  . aspirin 81 MG tablet Take 1 tablet (81 mg total) by mouth daily.    Marland Kitchen atorvastatin (LIPITOR) 20 MG tablet TAKE 1 TABLET BY MOUTH ONCE DAILY AT 6 PM 90 tablet 0  . clonazePAM (KLONOPIN) 0.5 MG tablet Take 0.5 mg by mouth daily as needed for anxiety.     Marland Kitchen ENTRESTO 97-103 MG Take 1 tablet by mouth twice daily 60 tablet 0  . metoprolol succinate (TOPROL-XL) 25 MG 24 hr tablet Take 1 tablet by mouth once daily 30 tablet 0  . Multiple Vitamins-Minerals (MULTIVITAMIN ADULT) CHEW Chew 1 each by mouth daily.    Marland Kitchen spironolactone (ALDACTONE) 25 MG tablet Take 12.5 mg by mouth as needed.     No current facility-administered medications for this visit.      Past Medical History:  Diagnosis Date  . AICD (automatic cardioverter/defibrillator) present 06/19/2018  . Anxiety   . Aortic stenosis, severe    a. suspect low flow, low gradient severe AS.   Marland Kitchen Arthritis   . Chronic kidney disease   . COPD (chronic obstructive pulmonary disease) ([place])   . Coronary artery disease   . Family history of adverse reaction to anesthesia    /son "passed out multiple times after anesthesia"  . Frequency of urination   . Heart murmur    "not since valve was replaced" (06/19/2018)  . High cholesterol   . Hypertension   . IBD (inflammatory bowel disease)    a. s/p partial colectomy in 2009  . Incidental pulmonary nodule, > 66m and < 850m11/11/2016   Several small nodules in left lung  . LBBB (left bundle branch block)   . NICM (nonischemic cardiomyopathy) ([facility]   a. 08/2014: EF 15% suspect to be  endstage CM 2/2 severe AS  . Pneumonia 08/2017  . S/P aortic valve replacement with bioprosthetic valve 10/18/2017   [facility] pericard[facility] valve  . Squamous carcinoma    ight ear  . Tobacco abuse     ROS:   All systems reviewed and negative except as noted in the HPI.   Past Surgical History:  Procedure Laterality Date  . AORTIC VALVE REPLACEMENT N/A 10/18/2017   Procedure: AORTIC VALVE REPLACEMENT;  Surgeon: Kenneth AlbertsMD;  [facility] Service: Open Heart Surgery;  Laterality: N/A;  . BIV ICD INSERTION CRT-D N/A 06/19/2018   Procedure: BIV ICD INSERTION CRT-D;  Surgeon: Kenneth LanceMD;  [facility];  Service: Cardiovascular;  Laterality: N/A;  . CARDIAC VALVE REPLACEMENT    . COLECTOMY  2009   bowel resection, for blockage  . INGUINAL HERNIA REPAIR Left 1965  . INGUINAL HERNIA REPAIR Right 08/27/2018   Procedure: RIGHT INGUINAL HERNIA REPAIR WITH MESH;  Surgeon: ToJovita KussmaulMD;  [facility] Service: General;  Laterality: Right;  . INSERTION OF MESH Right 08/27/2018   Procedure: INSERTION OF MESH;  Surgeon: ToJovita KussmaulMD;  [place] [facility] Service: General;  Laterality: Right;  . IR  FLUORO GUIDE CV MIDLINE PICC LEFT  09/04/2017  . RIGHT/LEFT HEART CATH AND CORONARY ANGIOGRAPHY N/A 09/06/2017   Procedure: RIGHT/LEFT HEART CATH AND CORONARY ANGIOGRAPHY;  Surgeon: Kenneth Artist, MD;  Location: [place] CV LAB;  Service: Cardiovascular;  Laterality: N/A;  . SQUAMOUS CELL CARCINOMA EXCISION Right    "ear"  . STERNOTOMY  10/18/2017   Procedure: STERNOTOMY;  Surgeon: Kenneth Alberts, MD;  Location: [facility] OR;  Service: Open Heart Surgery;;  . TEE WITHOUT CARDIOVERSION N/A 10/18/2017   Procedure: TRANSESOPHAGEAL ECHOCARDIOGRAM (TEE);  Surgeon: Kenneth Alberts, MD;  Location: [place];  Service: Open Heart Surgery;  Laterality: N/A;     Family History  Problem Relation Age of Onset  .  Hypertension Father   . Heart attack Father   . Lung cancer Brother      Social History   Socioeconomic History  . Marital status: Divorced    Spouse name: Not on file  . Number of children: Not on file  . Years of education: Not on file  . Highest education level: Not on file  Occupational History  . Not on file  Social Needs  . Financial resource strain: Not on file  . Food insecurity    Worry: Not on file    Inability: Not on file  . Transportation needs    Medical: Not on file    Non-medical: Not on file  Tobacco Use  . Smoking status: Current Some Day Smoker    Packs/day: 0.50    Years: 53.00    Pack years: 26.50    Types: Cigarettes    Start date: 63  . Smokeless tobacco: Former Systems developer    Types: Chew  Substance and Sexual Activity  . Alcohol use: Yes    Comment: 06/19/2018 "couple beers/year"  . Drug use: Not Currently    Types: Marijuana  . Sexual activity: Not on file  Lifestyle  . Physical activity    Days per week: Not on file    Minutes per session: Not on file  . Stress: Not on file  Relationships  . Social Herbalist on phone: Not on file    Gets together: Not on file    Attends religious service: Not on file    Active member of club or organization: Not on file    Attends meetings of clubs or organizations: Not on file    Relationship status: Not on file  . Intimate partner violence    Fear of current or ex partner: Not on file    Emotionally abused: Not on file    Physically abused: Not on file    Forced sexual activity: Not on file  Other Topics Concern  . Not on file  Social History Narrative  . Not on file     BP 120/62   Pulse 77   Ht 5' 10"  (1.778 m)   Wt 173 lb 9.6 oz (78.7 kg)   SpO2 96%   BMI 24.91 kg/m   Physical Exam:  Well appearing 68 yo man, looks older than stated age, NAD HEENT: Unremarkable Neck:  6 cm JVD, no thyromegally Lymphatics:  No adenopathy Back:  No CVA tenderness Lungs:  Clear with no  wheezes HEART:  Regular rate rhythm, no murmurs, no rubs, no clicks Abd:  soft, positive bowel sounds, no organomegally, no rebound, no guarding Ext:  2 plus pulses, no edema, no cyanosis, no clubbing Skin:  No rashes no nodules Neuro:  CN  II through XII intact, motor grossly intact  EKG - nsr with biv pacing  DEVICE  Normal device function.  See PaceArt for details.   Assess/Plan: 1. Chronic systolic heart failure - his symptoms remain class 2. He will continue his current meds.  2. Dyslipidemia - he will continue his statin therapy. 3. ICD - his medtronic Biv ICD is working normally.  4. AS - he is s/p valve replacement and is doing well.   Mikle Bosworth.D.

## 2019-08-15 ENCOUNTER — Ambulatory Visit (HOSPITAL_COMMUNITY)
Admission: RE | Admit: 2019-08-15 | Discharge: 2019-08-15 | Disposition: A | Discharge status: Home / Self Care | Payer: Medicare HMO | Source: Ambulatory Visit | Attending: Internal Medicine | Admitting: Internal Medicine

## 2019-08-15 ENCOUNTER — Telehealth (HOSPITAL_COMMUNITY): Payer: Self-pay | Admitting: Pharmacist

## 2019-08-15 ENCOUNTER — Encounter (HOSPITAL_COMMUNITY): Payer: Self-pay | Admitting: Internal Medicine

## 2019-08-15 ENCOUNTER — Other Ambulatory Visit: Payer: Self-pay

## 2019-08-15 ENCOUNTER — Ambulatory Visit (HOSPITAL_BASED_OUTPATIENT_CLINIC_OR_DEPARTMENT_OTHER)
Admission: RE | Admit: 2019-08-15 | Discharge: 2019-08-15 | Disposition: A | Discharge status: Home / Self Care | Payer: Medicare HMO | Source: Ambulatory Visit | Attending: Internal Medicine | Admitting: Internal Medicine

## 2019-08-15 ENCOUNTER — Other Ambulatory Visit: Payer: Self-pay | Admitting: Internal Medicine

## 2019-08-15 VITALS — BP 130/70 | HR 57 | Wt 174.6 lb

## 2019-08-15 DIAGNOSIS — I083 Combined rheumatic disorders of mitral, aortic and tricuspid valves: Secondary | ICD-10-CM | POA: Diagnosis not present

## 2019-08-15 DIAGNOSIS — F329 Major depressive disorder, single episode, unspecified: Secondary | ICD-10-CM | POA: Diagnosis not present

## 2019-08-15 DIAGNOSIS — I447 Left bundle-branch block, unspecified: Secondary | ICD-10-CM | POA: Insufficient documentation

## 2019-08-15 DIAGNOSIS — Z7982 Long term (current) use of aspirin: Secondary | ICD-10-CM | POA: Diagnosis not present

## 2019-08-15 DIAGNOSIS — F17210 Nicotine dependence, cigarettes, uncomplicated: Secondary | ICD-10-CM | POA: Insufficient documentation

## 2019-08-15 DIAGNOSIS — I2510 Atherosclerotic heart disease of native coronary artery without angina pectoris: Secondary | ICD-10-CM | POA: Diagnosis not present

## 2019-08-15 DIAGNOSIS — I5022 Chronic systolic (congestive) heart failure: Secondary | ICD-10-CM | POA: Diagnosis not present

## 2019-08-15 DIAGNOSIS — Z720 Tobacco use: Secondary | ICD-10-CM

## 2019-08-15 DIAGNOSIS — N189 Chronic kidney disease, unspecified: Secondary | ICD-10-CM | POA: Diagnosis not present

## 2019-08-15 DIAGNOSIS — M1990 Unspecified osteoarthritis, unspecified site: Secondary | ICD-10-CM | POA: Diagnosis not present

## 2019-08-15 DIAGNOSIS — Z953 Presence of xenogenic heart valve: Secondary | ICD-10-CM | POA: Insufficient documentation

## 2019-08-15 DIAGNOSIS — Z95810 Presence of automatic (implantable) cardiac defibrillator: Secondary | ICD-10-CM | POA: Insufficient documentation

## 2019-08-15 DIAGNOSIS — J449 Chronic obstructive pulmonary disease, unspecified: Secondary | ICD-10-CM | POA: Diagnosis not present

## 2019-08-15 DIAGNOSIS — I130 Hypertensive heart and chronic kidney disease with heart failure and stage 1 through stage 4 chronic kidney disease, or unspecified chronic kidney disease: Secondary | ICD-10-CM | POA: Diagnosis not present

## 2019-08-15 DIAGNOSIS — E7800 Pure hypercholesterolemia, unspecified: Secondary | ICD-10-CM | POA: Diagnosis not present

## 2019-08-15 DIAGNOSIS — F419 Anxiety disorder, unspecified: Secondary | ICD-10-CM | POA: Insufficient documentation

## 2019-08-15 MED ORDER — ENTRESTO 97-103 MG PO TABS: 1.0000 | ORAL_TABLET | Freq: Two times a day (BID) | ORAL | 11 refills | Status: DC

## 2019-08-15 MED ORDER — METOPROLOL SUCCINATE ER 25 MG PO TB24: 25.0000 mg | ORAL_TABLET | Freq: Every day | ORAL | 11 refills | Status: DC

## 2019-08-15 NOTE — Progress Notes (Signed)
  Echocardiogram 2D Echocardiogram has been performed.  Bobbye [place] 08/15/2019, 1:39 PM

## 2019-08-15 NOTE — Progress Notes (Signed)
Advanced Heart Failure Clinic Note   Primary Cardiologist: Dr. Haroldine Laws  EP: Dr Lovena Le  HPI: Kenneth Mills is a 68 y.o. male with h/o COPD, IBD s/p partial colectomy, Systolic CHF, and severe aortic stenosis s/p AVR 10/2017.  Admitted 09/01/17 with acute respiratory failure with parainfluenza and + PCT requiring intubation. Echo showed markedly depressed LV dysfunction and critical low-grade AS (new findings).   Cath showed non-obstructive disease, with marginal cardiac output, and severe AS.    TAVR team consulted and work up began. Pt had cardiac CTs and Carotid dopplers inpatient. PFTs and outpatient surgery follow up arranged.   Pt s/p sAVR 10/18/2017. Required milrinone transiently that admission. Meds titrated as tolerated.   Referred to EP 02/22/2018 with Dr Lovena Le.  Underwent CRT-D implant 06/19/2018.  Had hernia surgery in 9/19  He presents today for regular HF follow up. Says he hurt his back 5 weeks ago while vacuuming. Since that time has felt that he hasn't had much energy. Denies SOB or CP. No edema, orthopnea or PND. Still smoking 1ppd.   Echo today (08/15/19) EF 55-60% AVR ok Personally reviewed   Review of systems complete and found to be negative unless listed in HPI.   PFTs 09/14/17 FVC-pre 4.13     (91%) FVC-post 3.85   (84%) FEV1-pre 2.53   (75%) FEV1-post 2.46 (72%) TLC 6.19 (88%) DLCO 17.26 (53%) (uncorrected)  ECHO 02/05/2018 EF 20-25%  Echo 09/02/17: EF 15% diffuse HK Normal RV. Critical low-gradient AS  RHC/LHC 09/05/2017  Prox RCA lesion, 50 %stenosed.  Mid RCA lesion, 30 %stenosed.  Mid LAD lesion, 30 %stenosed.  Mid Cx to Dist Cx lesion, 20 %stenosed.  Findings:  Ao = 96/67 (81) LV 140/19 RA = 4 RV = 31/7 PA = 28/13 (21) PCW = 14 Fick cardiac output/index =3.7/1.9 PVR = 1.9 WU Ao sat = 97% PA sat = 69%, 65%  Aortic valve Peak gradient 56mHG Mean gradient 24 mmHG  Past Medical History:  Diagnosis Date  . AICD  (automatic cardioverter/defibrillator) present 06/19/2018  . Anxiety   . Aortic stenosis, severe    a. suspect low flow, low gradient severe AS.   .Marland KitchenArthritis   . Chronic kidney disease   . COPD (chronic obstructive pulmonary disease) ([facility]   . Coronary artery disease   . Family history of adverse reaction to anesthesia    /son "passed out multiple times after anesthesia"  . Frequency of urination   . Heart murmur    "not since valve was replaced" (06/19/2018)  . High cholesterol   . Hypertension   . IBD (inflammatory bowel disease)    a. s/p partial colectomy in 2009  . Incidental pulmonary nodule, > 315mand < 51m25m1/11/2016   Several small nodules in left lung  . LBBB (left bundle branch block)   . NICM (nonischemic cardiomyopathy) ([facility]  a. 08/2014: EF 15% suspect to be endstage CM 2/2 severe AS  . Pneumonia 08/2017  . S/P aortic valve replacement with bioprosthetic valve 10/18/2017   [facility] pericard[facility] valve  . Squamous carcinoma    ight ear  . Tobacco abuse    Current Outpatient Medications  Medication Sig Dispense Refill  . aspirin 81 MG tablet Take 1 tablet (81 mg total) by mouth daily.    . aMarland Kitchenorvastatin (LIPITOR) 20 MG tablet TAKE 1 TABLET BY MOUTH ONCE DAILY AT 6 PM 90 tablet 0  . clonazePAM (KLONOPIN) 0.5 MG tablet  Take 0.5 mg by mouth daily as needed for anxiety.     Marland Kitchen ENTRESTO 97-103 MG Take 1 tablet by mouth twice daily 60 tablet 0  . MELATONIN GUMMIES PO Take by mouth. 2 gummies nightly    . metoprolol succinate (TOPROL-XL) 25 MG 24 hr tablet Take 1 tablet by mouth once daily 30 tablet 0  . Multiple Vitamins-Minerals (MULTIVITAMIN ADULT) CHEW Chew 1 each by mouth daily.    Marland Kitchen spironolactone (ALDACTONE) 25 MG tablet Take 12.5 mg by mouth as needed.     No current facility-administered medications for this encounter.    No Known Allergies  Social History   Socioeconomic History  . Marital status: Divorced    Spouse  name: Not on file  . Number of children: Not on file  . Years of education: Not on file  . Highest education level: Not on file  Occupational History  . Not on file  Social Needs  . Financial resource strain: Not on file  . Food insecurity    Worry: Not on file    Inability: Not on file  . Transportation needs    Medical: Not on file    Non-medical: Not on file  Tobacco Use  . Smoking status: Current Some Day Smoker    Packs/day: 0.50    Years: 53.00    Pack years: 26.50    Types: Cigarettes    Start date: 56  . Smokeless tobacco: Former Systems developer    Types: Chew  Substance and Sexual Activity  . Alcohol use: Yes    Comment: 06/19/2018 "couple beers/year"  . Drug use: Not Currently    Types: Marijuana  . Sexual activity: Not on file  Lifestyle  . Physical activity    Days per week: Not on file    Minutes per session: Not on file  . Stress: Not on file  Relationships  . Social Herbalist on phone: Not on file    Gets together: Not on file    Attends religious service: Not on file    Active member of club or organization: Not on file    Attends meetings of clubs or organizations: Not on file    Relationship status: Not on file  . Intimate partner violence    Fear of current or ex partner: Not on file    Emotionally abused: Not on file    Physically abused: Not on file    Forced sexual activity: Not on file  Other Topics Concern  . Not on file  Social History Narrative  . Not on file   Family History  Problem Relation Age of Onset  . Hypertension Father   . Heart attack Father   . Lung cancer Brother    Vitals:   08/15/19 1405  BP: 130/70  Pulse: (!) 57  SpO2: 97%  Weight: 79.2 kg (174 lb 9.6 oz)   Wt Readings from Last 3 Encounters:  08/15/19 79.2 kg (174 lb 9.6 oz)  08/05/19 78.7 kg (173 lb 9.6 oz)  11/15/18 76.7 kg (169 lb)    PHYSICAL EXAM: General:  Well appearing. No resp difficulty HEENT: normal Neck: supple. no JVD. Carotids 2+ bilat;  no bruits. No lymphadenopathy or thryomegaly appreciated. Cor: PMI nondisplaced. Regular rate & rhythm. No rubs, gallops or murmurs. Lungs: clear with markedly reduced breath sounds.  Abdomen: soft, nontender, nondistended. No hepatosplenomegaly. No bruits or masses. Good bowel sounds. Extremities: no cyanosis, clubbing, rash, edema Neuro: alert & orientedx3, cranial  nerves grossly intact. moves all 4 extremities w/o difficulty. Affect pleasant  ECG: AV paced 70 Personally reviewed   ASSESSMENT & PLAN: 1. Chronic systolic CHF - Echo 32/44/01 LVEF 15%, RV normal, critical low gradient AS; TEE 12/18: EF 20-25%, normal RV, mild MR, trace TR - Echo 02/2018 EF 20-25%. Now s/p CRT-D 06/19/18. - Echo today 08/15/19 EF 55-60% complete recovery after CRT-D - Volume status looks good - Continue lasix 50m prn  - Continue Entresto to 97/103 mg BID -> He is having trouble affording this. Met with our PharmD today and we will work to get [employer] Warner[employer]istance for him. If unable to obtain switch to losartan 100 daily - ContinueToprol XL 25 mg  - Off spiro   2. COPD - PFTS 09/14/18 better than expected with tobacco use.   - DLCO reduced. No change.   3. Low-gradient critical aortic stenosis s/p bioprosthetic AVR 10/18/2017 - Valve looks good today on echo - Aware of need for dental prophylaxis.   4. Tobacco abuse - Counseled on need to quit  5. CAD - No s/s of ischemia - Mild non-obstructive CAD with 50% proximal RCA with spasm on cath 09/06/17 - Continue atorvastatin 20 mg daily + ASA 81 mg daily  6. Pulmonary nodules.  - Scattered pulmonary nodules noted on CT 09/07/17.  -CT 03/28/2018 Improving pulmonary nodules with resolving inflammatory nodules.   7. LBBB - S/p CRT-D 06/19/2018 - EF improved. - Follows with EP   DGlori Bickers MD  2:29 PM

## 2019-08-15 NOTE — Telephone Encounter (Signed)
Spoke with patient in clinic today because he was having issues affording his Entresto copay, even though he has a [employer] in addition to [employer] Part D. I called his pharmacy and found that they were not billing the PAN grant and were instead only billing Medicare Part D. After resubmitting the claim correctly, his copay is now $0.00. I will not submit manufacturers' assistance application at this time given he still has ~$400 left on his PAN grant that is available through January 2021. Will re-evaluate at that time.   Audry Riles, PharmD, BCPS, CPP Heart Failure Clinic Pharmacist [phone]

## 2019-08-15 NOTE — Patient Instructions (Signed)
Please follow up with the [facility] in 6 months. We currently do not have that schedule. Please give [place] a call in January 2021 in order to schedule this appointment. Please call at [phone] option #3.  At the [facility], you and your health needs are our priority. As part of our continuing mission to provide you with exceptional heart care, we have created designated Provider Care Teams. These Care Teams include your primary Cardiologist (physician) and Advanced Practice Providers (APPs- Physician Assistants and Nurse Practitioners) who all work together to provide you with the care you need, when you need it.   You may see any of the following providers on your designated Care Team at your next follow up: Marland Kitchen Dr Glori Bickers . Dr Loralie Champagne . Darrick Grinder, NP . Lyda Jester, PA   Please be sure to bring in all your medications bottles to every appointment.

## 2019-09-12 ENCOUNTER — Other Ambulatory Visit (HOSPITAL_COMMUNITY): Payer: Self-pay | Admitting: Internal Medicine

## 2019-09-30 DIAGNOSIS — L814 Other melanin hyperpigmentation: Secondary | ICD-10-CM | POA: Diagnosis not present

## 2019-09-30 DIAGNOSIS — L821 Other seborrheic keratosis: Secondary | ICD-10-CM | POA: Diagnosis not present

## 2019-09-30 DIAGNOSIS — D485 Neoplasm of uncertain behavior of skin: Secondary | ICD-10-CM | POA: Diagnosis not present

## 2019-09-30 DIAGNOSIS — L578 Other skin changes due to chronic exposure to nonionizing radiation: Secondary | ICD-10-CM | POA: Diagnosis not present

## 2019-09-30 DIAGNOSIS — L570 Actinic keratosis: Secondary | ICD-10-CM | POA: Diagnosis not present

## 2019-11-04 ENCOUNTER — Ambulatory Visit (INDEPENDENT_AMBULATORY_CARE_PROVIDER_SITE_OTHER): Payer: Medicare HMO | Admitting: *Deleted

## 2019-11-04 DIAGNOSIS — I428 Other cardiomyopathies: Secondary | ICD-10-CM | POA: Diagnosis not present

## 2019-11-04 LAB — CUP PACEART REMOTE DEVICE CHECK
Battery Remaining Longevity: 66 mo
Battery Voltage: 2.97 V
Brady Statistic AP VP Percent: 19.83 %
Brady Statistic AP VS Percent: 0.02 %
Brady Statistic AS VP Percent: 79.65 %
Brady Statistic AS VS Percent: 0.5 %
Brady Statistic RA Percent Paced: 19.62 %
Brady Statistic RV Percent Paced: 98.09 %
Date Time Interrogation Session: 20201228031604
HighPow Impedance: 76 Ohm
Implantable Lead Implant Date: 20190813
Implantable Lead Implant Date: 20190813
Implantable Lead Implant Date: 20190813
Implantable Lead Location: 753858
Implantable Lead Location: 753859
Implantable Lead Location: 753860
Implantable Lead Model: 4598
Implantable Lead Model: 5076
Implantable Lead Model: 6935
Implantable Pulse Generator Implant Date: 20190813
Lead Channel Impedance Value: 180.5 Ohm
Lead Channel Impedance Value: 189.525
Lead Channel Impedance Value: 201.488
Lead Channel Impedance Value: 201.488
Lead Channel Impedance Value: 212.8 Ohm
Lead Channel Impedance Value: 361 Ohm
Lead Channel Impedance Value: 361 Ohm
Lead Channel Impedance Value: 399 Ohm
Lead Channel Impedance Value: 418 Ohm
Lead Channel Impedance Value: 418 Ohm
Lead Channel Impedance Value: 456 Ohm
Lead Channel Impedance Value: 456 Ohm
Lead Channel Impedance Value: 513 Ohm
Lead Channel Impedance Value: 608 Ohm
Lead Channel Impedance Value: 646 Ohm
Lead Channel Impedance Value: 703 Ohm
Lead Channel Impedance Value: 703 Ohm
Lead Channel Impedance Value: 722 Ohm
Lead Channel Pacing Threshold Amplitude: 0.5 V
Lead Channel Pacing Threshold Amplitude: 0.625 V
Lead Channel Pacing Threshold Amplitude: 1.5 V
Lead Channel Pacing Threshold Pulse Width: 0.4 ms
Lead Channel Pacing Threshold Pulse Width: 0.4 ms
Lead Channel Pacing Threshold Pulse Width: 0.4 ms
Lead Channel Sensing Intrinsic Amplitude: 1.75 mV
Lead Channel Sensing Intrinsic Amplitude: 1.75 mV
Lead Channel Sensing Intrinsic Amplitude: 7.625 mV
Lead Channel Sensing Intrinsic Amplitude: 7.625 mV
Lead Channel Setting Pacing Amplitude: 1.5 V
Lead Channel Setting Pacing Amplitude: 2 V
Lead Channel Setting Pacing Amplitude: 2.5 V
Lead Channel Setting Pacing Pulse Width: 0.4 ms
Lead Channel Setting Pacing Pulse Width: 0.4 ms
Lead Channel Setting Sensing Sensitivity: 0.3 mV

## 2019-11-14 DIAGNOSIS — F419 Anxiety disorder, unspecified: Secondary | ICD-10-CM | POA: Diagnosis not present

## 2019-11-14 DIAGNOSIS — Z1211 Encounter for screening for malignant neoplasm of colon: Secondary | ICD-10-CM | POA: Diagnosis not present

## 2019-11-14 DIAGNOSIS — Z23 Encounter for immunization: Secondary | ICD-10-CM | POA: Diagnosis not present

## 2019-11-15 ENCOUNTER — Telehealth (HOSPITAL_COMMUNITY): Payer: Self-pay | Admitting: Pharmacist

## 2019-11-15 NOTE — Telephone Encounter (Signed)
[place] for copay assistance for [employer].  Member ID: [id number] Group ID: [id number] RxBin ID: [id number] PCN: [id number] Eligibility Start Date: 11/11/2019 Eligibility End Date: 11/09/2020 Assistance Amount: $1,000.00  Will update pharmacy with new card information.  Audry Riles, PharmD, BCPS, BCCP, CPP Heart Failure Clinic Pharmacist [phone]

## 2019-12-30 DIAGNOSIS — L821 Other seborrheic keratosis: Secondary | ICD-10-CM | POA: Diagnosis not present

## 2019-12-30 DIAGNOSIS — L570 Actinic keratosis: Secondary | ICD-10-CM | POA: Diagnosis not present

## 2019-12-30 DIAGNOSIS — L814 Other melanin hyperpigmentation: Secondary | ICD-10-CM | POA: Diagnosis not present

## 2019-12-30 DIAGNOSIS — D225 Melanocytic nevi of trunk: Secondary | ICD-10-CM | POA: Diagnosis not present

## 2020-02-03 ENCOUNTER — Ambulatory Visit (INDEPENDENT_AMBULATORY_CARE_PROVIDER_SITE_OTHER): Payer: Medicare HMO | Admitting: *Deleted

## 2020-02-03 DIAGNOSIS — I428 Other cardiomyopathies: Secondary | ICD-10-CM

## 2020-02-03 LAB — CUP PACEART REMOTE DEVICE CHECK
Battery Remaining Longevity: 58 mo
Battery Voltage: 2.97 V
Brady Statistic AP VP Percent: 17.08 %
Brady Statistic AP VS Percent: 0.02 %
Brady Statistic AS VP Percent: 82.88 %
Brady Statistic AS VS Percent: 0.02 %
Brady Statistic RA Percent Paced: 17.09 %
Brady Statistic RV Percent Paced: 99.83 %
Date Time Interrogation Session: 20210329012203
HighPow Impedance: 82 Ohm
Implantable Lead Implant Date: 20190813
Implantable Lead Implant Date: 20190813
Implantable Lead Implant Date: 20190813
Implantable Lead Location: 753858
Implantable Lead Location: 753859
Implantable Lead Location: 753860
Implantable Lead Model: 4598
Implantable Lead Model: 5076
Implantable Lead Model: 6935
Implantable Pulse Generator Implant Date: 20190813
Lead Channel Impedance Value: 199.5 Ohm
Lead Channel Impedance Value: 204.14 Ohm
Lead Channel Impedance Value: 224.438
Lead Channel Impedance Value: 224.438
Lead Channel Impedance Value: 230.327
Lead Channel Impedance Value: 399 Ohm
Lead Channel Impedance Value: 399 Ohm
Lead Channel Impedance Value: 418 Ohm
Lead Channel Impedance Value: 418 Ohm
Lead Channel Impedance Value: 456 Ohm
Lead Channel Impedance Value: 475 Ohm
Lead Channel Impedance Value: 475 Ohm
Lead Channel Impedance Value: 513 Ohm
Lead Channel Impedance Value: 665 Ohm
Lead Channel Impedance Value: 665 Ohm
Lead Channel Impedance Value: 760 Ohm
Lead Channel Impedance Value: 779 Ohm
Lead Channel Impedance Value: 779 Ohm
Lead Channel Pacing Threshold Amplitude: 0.375 V
Lead Channel Pacing Threshold Amplitude: 0.625 V
Lead Channel Pacing Threshold Amplitude: 1.375 V
Lead Channel Pacing Threshold Pulse Width: 0.4 ms
Lead Channel Pacing Threshold Pulse Width: 0.4 ms
Lead Channel Pacing Threshold Pulse Width: 0.4 ms
Lead Channel Sensing Intrinsic Amplitude: 1.375 mV
Lead Channel Sensing Intrinsic Amplitude: 1.375 mV
Lead Channel Sensing Intrinsic Amplitude: 8.375 mV
Lead Channel Sensing Intrinsic Amplitude: 8.375 mV
Lead Channel Setting Pacing Amplitude: 1.5 V
Lead Channel Setting Pacing Amplitude: 2 V
Lead Channel Setting Pacing Amplitude: 2.5 V
Lead Channel Setting Pacing Pulse Width: 0.4 ms
Lead Channel Setting Pacing Pulse Width: 0.4 ms
Lead Channel Setting Sensing Sensitivity: 0.3 mV

## 2020-02-03 NOTE — Progress Notes (Signed)
ICD Remote  

## 2020-02-12 ENCOUNTER — Other Ambulatory Visit (HOSPITAL_COMMUNITY): Payer: Self-pay

## 2020-02-12 MED ORDER — ATORVASTATIN CALCIUM 20 MG PO TABS: ORAL_TABLET | ORAL | 3 refills | Status: DC

## 2020-02-12 MED ORDER — METOPROLOL SUCCINATE ER 25 MG PO TB24: 25.0000 mg | ORAL_TABLET | Freq: Every day | ORAL | 3 refills | Status: DC

## 2020-02-21 ENCOUNTER — Other Ambulatory Visit (HOSPITAL_COMMUNITY): Payer: Self-pay

## 2020-02-21 NOTE — Telephone Encounter (Signed)
error 

## 2020-03-13 ENCOUNTER — Encounter (HOSPITAL_COMMUNITY): Payer: Medicare HMO | Admitting: Internal Medicine

## 2020-03-26 ENCOUNTER — Encounter (HOSPITAL_COMMUNITY): Payer: Self-pay | Admitting: Internal Medicine

## 2020-03-26 ENCOUNTER — Ambulatory Visit (HOSPITAL_COMMUNITY)
Admission: RE | Admit: 2020-03-26 | Discharge: 2020-03-26 | Disposition: A | Discharge status: Home / Self Care | Payer: Medicare HMO | Source: Ambulatory Visit | Attending: Internal Medicine | Admitting: Internal Medicine

## 2020-03-26 ENCOUNTER — Other Ambulatory Visit: Payer: Self-pay

## 2020-03-26 VITALS — BP 150/84 | HR 70 | Wt 173.8 lb

## 2020-03-26 DIAGNOSIS — Z8249 Family history of ischemic heart disease and other diseases of the circulatory system: Secondary | ICD-10-CM | POA: Insufficient documentation

## 2020-03-26 DIAGNOSIS — I5022 Chronic systolic (congestive) heart failure: Secondary | ICD-10-CM | POA: Diagnosis not present

## 2020-03-26 DIAGNOSIS — Z9049 Acquired absence of other specified parts of digestive tract: Secondary | ICD-10-CM | POA: Diagnosis not present

## 2020-03-26 DIAGNOSIS — E785 Hyperlipidemia, unspecified: Secondary | ICD-10-CM | POA: Diagnosis not present

## 2020-03-26 DIAGNOSIS — Z85828 Personal history of other malignant neoplasm of skin: Secondary | ICD-10-CM | POA: Insufficient documentation

## 2020-03-26 DIAGNOSIS — M1990 Unspecified osteoarthritis, unspecified site: Secondary | ICD-10-CM | POA: Diagnosis not present

## 2020-03-26 DIAGNOSIS — F17210 Nicotine dependence, cigarettes, uncomplicated: Secondary | ICD-10-CM | POA: Diagnosis not present

## 2020-03-26 DIAGNOSIS — Z7982 Long term (current) use of aspirin: Secondary | ICD-10-CM | POA: Diagnosis not present

## 2020-03-26 DIAGNOSIS — I130 Hypertensive heart and chronic kidney disease with heart failure and stage 1 through stage 4 chronic kidney disease, or unspecified chronic kidney disease: Secondary | ICD-10-CM | POA: Diagnosis not present

## 2020-03-26 DIAGNOSIS — J449 Chronic obstructive pulmonary disease, unspecified: Secondary | ICD-10-CM | POA: Insufficient documentation

## 2020-03-26 DIAGNOSIS — Z720 Tobacco use: Secondary | ICD-10-CM | POA: Diagnosis not present

## 2020-03-26 DIAGNOSIS — Z953 Presence of xenogenic heart valve: Secondary | ICD-10-CM | POA: Insufficient documentation

## 2020-03-26 DIAGNOSIS — Z79899 Other long term (current) drug therapy: Secondary | ICD-10-CM | POA: Diagnosis not present

## 2020-03-26 DIAGNOSIS — I5021 Acute systolic (congestive) heart failure: Secondary | ICD-10-CM | POA: Diagnosis not present

## 2020-03-26 DIAGNOSIS — F419 Anxiety disorder, unspecified: Secondary | ICD-10-CM | POA: Diagnosis not present

## 2020-03-26 DIAGNOSIS — K580 Irritable bowel syndrome with diarrhea: Secondary | ICD-10-CM | POA: Insufficient documentation

## 2020-03-26 DIAGNOSIS — I350 Nonrheumatic aortic (valve) stenosis: Secondary | ICD-10-CM | POA: Insufficient documentation

## 2020-03-26 DIAGNOSIS — I447 Left bundle-branch block, unspecified: Secondary | ICD-10-CM | POA: Insufficient documentation

## 2020-03-26 DIAGNOSIS — Z95810 Presence of automatic (implantable) cardiac defibrillator: Secondary | ICD-10-CM | POA: Insufficient documentation

## 2020-03-26 DIAGNOSIS — I428 Other cardiomyopathies: Secondary | ICD-10-CM

## 2020-03-26 DIAGNOSIS — R918 Other nonspecific abnormal finding of lung field: Secondary | ICD-10-CM | POA: Insufficient documentation

## 2020-03-26 DIAGNOSIS — I2510 Atherosclerotic heart disease of native coronary artery without angina pectoris: Secondary | ICD-10-CM | POA: Diagnosis not present

## 2020-03-26 DIAGNOSIS — N189 Chronic kidney disease, unspecified: Secondary | ICD-10-CM | POA: Diagnosis not present

## 2020-03-26 DIAGNOSIS — E7800 Pure hypercholesterolemia, unspecified: Secondary | ICD-10-CM | POA: Diagnosis not present

## 2020-03-26 MED ORDER — TAMSULOSIN HCL 0.4 MG PO CAPS: 0.4000 mg | ORAL_CAPSULE | Freq: Every day | ORAL | 6 refills | Status: DC

## 2020-03-26 NOTE — Progress Notes (Signed)
Advanced Heart Failure Clinic Note   Primary Cardiologist: Dr. Haroldine Laws  EP: Dr Kenneth Mills  HPI: Kenneth Mills is a 69 y.o. male with h/o COPD, IBD s/p partial colectomy, Systolic CHF, and severe aortic stenosis s/p AVR 10/2017.  Admitted 09/01/17 with acute respiratory failure with parainfluenza and + PCT requiring intubation. Echo showed markedly depressed LV dysfunction and critical low-grade AS (new findings).   Cath showed non-obstructive disease, with marginal cardiac output, and severe AS.    TAVR team consulted and work up began. Pt had cardiac CTs and Carotid dopplers inpatient. PFTs and outpatient surgery follow up arranged.   Pt s/p sAVR 10/18/2017. Required milrinone transiently that admission. Meds titrated as tolerated.   Referred to EP 02/22/2018 with Dr Kenneth Mills.  Underwent CRT-D implant 06/19/2018.  He presents today for regular HF follow up. Says he feels good. No CP or SOB. No edema, orthopnea or PND. Smoking 1 ppd  ICD interrogated personally: No VT. Activity 4-6hr/day 100% VP. Fluid ok Personally reviewed   Echo 08/15/19 EF 55-60% AVR ok Personally reviewed   Review of systems complete and found to be negative unless listed in HPI.   PFTs 09/14/17 FVC-pre 4.13     (91%) FVC-post 3.85   (84%) FEV1-pre 2.53   (75%) FEV1-post 2.46 (72%) TLC 6.19 (88%) DLCO 17.26 (53%) (uncorrected)  ECHO 02/05/2018 EF 20-25%  Echo 09/02/17: EF 15% diffuse HK Normal RV. Critical low-gradient AS  RHC/LHC 09/05/2017  Prox RCA lesion, 50 %stenosed.  Mid RCA lesion, 30 %stenosed.  Mid LAD lesion, 30 %stenosed.  Mid Cx to Dist Cx lesion, 20 %stenosed.  Findings:  Ao = 96/67 (81) LV 140/19 RA = 4 RV = 31/7 PA = 28/13 (21) PCW = 14 Fick cardiac output/index =3.7/1.9 PVR = 1.9 WU Ao sat = 97% PA sat = 69%, 65%  Aortic valve Peak gradient 34mHG Mean gradient 24 mmHG  Past Medical History:  Diagnosis Date  . AICD (automatic cardioverter/defibrillator) present  06/19/2018  . Anxiety   . Aortic stenosis, severe    a. suspect low flow, low gradient severe AS.   .Marland KitchenArthritis   . Chronic kidney disease   . COPD [place] disease) [place]   . Coronary artery disease   . Family history of adverse reaction to anesthesia    /son "passed out multiple times after anesthesia"  . Frequency of urination   . Heart murmur    "not since valve was replaced" (06/19/2018)  . High cholesterol   . Hypertension   . IBD (inflammatory bowel disease)    a. s/p partial colectomy in 2009  . Incidental pulmonary nodule, > 353mand < 29m71m1/11/2016   Several small nodules in left lung  . LBBB (left bundle branch block)   . NICM (nonischemic cardiomyopathy) ([place] [place]  a. 08/2014: EF 15% suspect to be endstage CM 2/2 severe AS  . Pneumonia 08/2017  . S/P aortic valve replacement with bioprosthetic valve 10/18/2017   [facility] st[facility] bovine pericard[facility] tissue valve  . Squamous carcinoma    ight ear  . Tobacco abuse    Current Outpatient Medications  Medication Sig Dispense Refill  . aspirin 81 MG tablet Take 1 tablet (81 mg total) by mouth daily.    . aMarland Kitchenorvastatin (LIPITOR) 20 MG tablet TAKE 1 TABLET BY MOUTH ONCE DAILY AT 6 PM 90 tablet 3  . clonazePAM (KLONOPIN) 0.5 MG tablet Take 0.5 mg by mouth daily as needed for anxiety.     .Marland Kitchen  MELATONIN GUMMIES PO Take by mouth. 2 gummies nightly    . metoprolol succinate (TOPROL-XL) 25 MG 24 hr tablet Take 1 tablet (25 mg total) by mouth daily. 90 tablet 3  . Multiple Vitamins-Minerals (MULTIVITAMIN ADULT) CHEW Chew 1 each by mouth daily.    Marland Kitchen spironolactone (ALDACTONE) 25 MG tablet Take 12.5 mg by mouth as needed.     No current facility-administered medications for this encounter.   No Known Allergies  Social History   Socioeconomic History  . Marital status: Divorced    Spouse name: Not on file  . Number of children: Not on file  . Years of education: Not on file  . Highest education  level: Not on file  Occupational History  . Not on file  Tobacco Use  . Smoking status: Current Some Day Smoker    Packs/day: 0.50    Years: 53.00    Pack years: 26.50    Types: Cigarettes    Start date: 82  . Smokeless tobacco: Former Systems developer    Types: Chew  Substance and Sexual Activity  . Alcohol use: Yes    Comment: 06/19/2018 "couple beers/year"  . Drug use: Not Currently    Types: Marijuana  . Sexual activity: Not on file  Other Topics Concern  . Not on file  Social History Narrative  . Not on file   Social Determinants of Health   Financial Resource Strain:   . Difficulty of Paying Living Expenses:   Food Insecurity:   . Worried About Charity fundraiser in the Last Year:   . Arboriculturist in the Last Year:   Transportation Needs:   . Film/video editor (Medical):   Marland Kitchen Lack of Transportation (Non-Medical):   Physical Activity:   . Days of Exercise per Week:   . Minutes of Exercise per Session:   Stress:   . Feeling of Stress :   Social Connections:   . Frequency of Communication with Friends and Family:   . Frequency of Social Gatherings with Friends and Family:   . Attends Religious Services:   . Active Member of Clubs or Organizations:   . Attends Archivist Meetings:   Marland Kitchen Marital Status:   Intimate Partner Violence:   . Fear of Current or Ex-Partner:   . Emotionally Abused:   Marland Kitchen Physically Abused:   . Sexually Abused:    Family History  Problem Relation Age of Onset  . Hypertension Father   . Heart attack Father   . Lung cancer Brother    Vitals:   03/26/20 1519  BP: (!) 150/84  Pulse: 70  SpO2: 95%  Weight: 78.8 kg (173 lb 12.8 oz)   Wt Readings from Last 3 Encounters:  03/26/20 78.8 kg (173 lb 12.8 oz)  08/15/19 79.2 kg (174 lb 9.6 oz)  08/05/19 78.7 kg (173 lb 9.6 oz)    PHYSICAL EXAM: General:  Well appearing. No resp difficulty HEENT: normal Neck: supple. no JVD. Carotids 2+ bilat; no bruits. No lymphadenopathy or  thryomegaly appreciated. Cor: PMI nondisplaced. Regular rate & rhythm. No rubs, gallops or murmurs. Lungs: clear with decreased BS throughout  No wheeze Abdomen: soft, nontender, nondistended. No hepatosplenomegaly. No bruits or masses. Good bowel sounds. Extremities: no cyanosis, clubbing, rash, edema Neuro: alert & orientedx3, cranial nerves grossly intact. moves all 4 extremities w/o difficulty. Affect pleasant   ASSESSMENT & PLAN:  1. Chronic systolic CHF - Echo 83/38/25 LVEF 15%, RV normal, critical low gradient AS;  TEE 12/18: EF 20-25%, normal RV, mild MR, trace TR - Echo 02/2018 EF 20-25%. Now s/p CRT-D 06/19/18. - Echo 08/15/19 EF 55-60% complete recovery after CRT-D - ICD interrogated in clinic personally as above - Volume status ok  - Continue lasix 33m prn  - Continue Entresto to 97/103 mg BID - ContinueToprol XL 25 mg  - Off spiro by his choice  2. COPD with ongoing tobacco use - PFTS 09/14/18 better than expected with tobacco use.   - DLCO reduced. Still smoking 1 ppd - I again encouraged him to quit  3. Low-gradient critical aortic stenosis s/p bioprosthetic AVR 10/18/2017 - Valve looks good  on echo - aware of need for SBE - we discussed again today  4. Tobacco abuse - smoking 1ppd. Realizes he needs to quit  5. CAD - Mild non-obstructive CAD with 50% proximal RCA with spasm on cath 09/06/17 - No s/s ischemia - Continue atorvastatin 20 mg daily + ASA 81 mg daily  6. Pulmonary nodules.  - Scattered pulmonary nodules noted on CT 09/07/17.  -CT 03/28/2018 Improving pulmonary nodules with resolving inflammatory nodules.   7. LBBB - S/p CRT-D 06/19/2018 - EF improved. - Follows with EP  [facility] Clinic [facility]   DGlori Bickers MD  3:49 PM

## 2020-03-26 NOTE — Patient Instructions (Signed)
Start Flomax 0.4 mg every evening  You have been referred to [facility] in 6 months, they will call you for an appointment

## 2020-03-30 DIAGNOSIS — L821 Other seborrheic keratosis: Secondary | ICD-10-CM | POA: Diagnosis not present

## 2020-03-30 DIAGNOSIS — L570 Actinic keratosis: Secondary | ICD-10-CM | POA: Diagnosis not present

## 2020-05-04 ENCOUNTER — Ambulatory Visit (INDEPENDENT_AMBULATORY_CARE_PROVIDER_SITE_OTHER): Payer: Medicare HMO | Admitting: *Deleted

## 2020-05-04 DIAGNOSIS — I428 Other cardiomyopathies: Secondary | ICD-10-CM

## 2020-05-04 DIAGNOSIS — I5022 Chronic systolic (congestive) heart failure: Secondary | ICD-10-CM | POA: Diagnosis not present

## 2020-05-05 DIAGNOSIS — J449 Chronic obstructive pulmonary disease, unspecified: Secondary | ICD-10-CM | POA: Diagnosis not present

## 2020-05-05 DIAGNOSIS — E785 Hyperlipidemia, unspecified: Secondary | ICD-10-CM | POA: Diagnosis not present

## 2020-05-05 DIAGNOSIS — I5021 Acute systolic (congestive) heart failure: Secondary | ICD-10-CM | POA: Diagnosis not present

## 2020-05-05 LAB — CUP PACEART REMOTE DEVICE CHECK
Battery Remaining Longevity: 46 mo
Battery Voltage: 2.96 V
Brady Statistic AP VP Percent: 11.54 %
Brady Statistic AP VS Percent: 0.02 %
Brady Statistic AS VP Percent: 88.44 %
Brady Statistic AS VS Percent: 0 %
Brady Statistic RA Percent Paced: 11.55 %
Brady Statistic RV Percent Paced: 99.91 %
Date Time Interrogation Session: 20210628022604
HighPow Impedance: 82 Ohm
Implantable Lead Implant Date: 20190813
Implantable Lead Implant Date: 20190813
Implantable Lead Implant Date: 20190813
Implantable Lead Location: 753858
Implantable Lead Location: 753859
Implantable Lead Location: 753860
Implantable Lead Model: 4598
Implantable Lead Model: 5076
Implantable Lead Model: 6935
Implantable Pulse Generator Implant Date: 20190813
Lead Channel Impedance Value: 204.14 Ohm
Lead Channel Impedance Value: 204.14 Ohm
Lead Channel Impedance Value: 224.438
Lead Channel Impedance Value: 224.438
Lead Channel Impedance Value: 230.327
Lead Channel Impedance Value: 361 Ohm
Lead Channel Impedance Value: 399 Ohm
Lead Channel Impedance Value: 399 Ohm
Lead Channel Impedance Value: 418 Ohm
Lead Channel Impedance Value: 418 Ohm
Lead Channel Impedance Value: 456 Ohm
Lead Channel Impedance Value: 456 Ohm
Lead Channel Impedance Value: 513 Ohm
Lead Channel Impedance Value: 665 Ohm
Lead Channel Impedance Value: 703 Ohm
Lead Channel Impedance Value: 760 Ohm
Lead Channel Impedance Value: 760 Ohm
Lead Channel Impedance Value: 779 Ohm
Lead Channel Pacing Threshold Amplitude: 0.5 V
Lead Channel Pacing Threshold Amplitude: 1 V
Lead Channel Pacing Threshold Amplitude: 1.75 V
Lead Channel Pacing Threshold Pulse Width: 0.4 ms
Lead Channel Pacing Threshold Pulse Width: 0.4 ms
Lead Channel Pacing Threshold Pulse Width: 0.4 ms
Lead Channel Sensing Intrinsic Amplitude: 1 mV
Lead Channel Sensing Intrinsic Amplitude: 1 mV
Lead Channel Sensing Intrinsic Amplitude: 6 mV
Lead Channel Sensing Intrinsic Amplitude: 6 mV
Lead Channel Setting Pacing Amplitude: 1.5 V
Lead Channel Setting Pacing Amplitude: 2.25 V
Lead Channel Setting Pacing Amplitude: 2.5 V
Lead Channel Setting Pacing Pulse Width: 0.4 ms
Lead Channel Setting Pacing Pulse Width: 0.4 ms
Lead Channel Setting Sensing Sensitivity: 0.3 mV

## 2020-05-06 NOTE — Progress Notes (Signed)
Remote ICD transmission.   

## 2020-05-12 DIAGNOSIS — Z1211 Encounter for screening for malignant neoplasm of colon: Secondary | ICD-10-CM | POA: Diagnosis not present

## 2020-05-12 DIAGNOSIS — E785 Hyperlipidemia, unspecified: Secondary | ICD-10-CM | POA: Diagnosis not present

## 2020-05-12 DIAGNOSIS — R7303 Prediabetes: Secondary | ICD-10-CM | POA: Diagnosis not present

## 2020-05-12 DIAGNOSIS — F419 Anxiety disorder, unspecified: Secondary | ICD-10-CM | POA: Diagnosis not present

## 2020-05-12 DIAGNOSIS — Z0000 Encounter for general adult medical examination without abnormal findings: Secondary | ICD-10-CM | POA: Diagnosis not present

## 2020-05-14 DIAGNOSIS — Z1211 Encounter for screening for malignant neoplasm of colon: Secondary | ICD-10-CM | POA: Diagnosis not present

## 2020-05-30 DIAGNOSIS — J449 Chronic obstructive pulmonary disease, unspecified: Secondary | ICD-10-CM | POA: Diagnosis not present

## 2020-05-30 DIAGNOSIS — E785 Hyperlipidemia, unspecified: Secondary | ICD-10-CM | POA: Diagnosis not present

## 2020-05-30 DIAGNOSIS — I5021 Acute systolic (congestive) heart failure: Secondary | ICD-10-CM | POA: Diagnosis not present

## 2020-06-29 DIAGNOSIS — L570 Actinic keratosis: Secondary | ICD-10-CM | POA: Diagnosis not present

## 2020-06-29 DIAGNOSIS — L219 Seborrheic dermatitis, unspecified: Secondary | ICD-10-CM | POA: Diagnosis not present

## 2020-07-07 DIAGNOSIS — E785 Hyperlipidemia, unspecified: Secondary | ICD-10-CM | POA: Diagnosis not present

## 2020-07-07 DIAGNOSIS — J449 Chronic obstructive pulmonary disease, unspecified: Secondary | ICD-10-CM | POA: Diagnosis not present

## 2020-07-07 DIAGNOSIS — I5021 Acute systolic (congestive) heart failure: Secondary | ICD-10-CM | POA: Diagnosis not present

## 2020-08-03 ENCOUNTER — Ambulatory Visit (INDEPENDENT_AMBULATORY_CARE_PROVIDER_SITE_OTHER): Payer: Medicare HMO | Admitting: Emergency Medicine

## 2020-08-03 DIAGNOSIS — I428 Other cardiomyopathies: Secondary | ICD-10-CM

## 2020-08-04 ENCOUNTER — Other Ambulatory Visit (HOSPITAL_COMMUNITY): Payer: Self-pay | Admitting: Internal Medicine

## 2020-08-04 LAB — CUP PACEART REMOTE DEVICE CHECK
Battery Remaining Longevity: 43 mo
Battery Voltage: 2.95 V
Brady Statistic AP VP Percent: 14.46 %
Brady Statistic AP VS Percent: 0.02 %
Brady Statistic AS VP Percent: 85.49 %
Brady Statistic AS VS Percent: 0.04 %
Brady Statistic RA Percent Paced: 14.39 %
Brady Statistic RV Percent Paced: 99.35 %
Date Time Interrogation Session: 20210927022604
HighPow Impedance: 73 Ohm
Implantable Lead Implant Date: 20190813
Implantable Lead Implant Date: 20190813
Implantable Lead Implant Date: 20190813
Implantable Lead Location: 753858
Implantable Lead Location: 753859
Implantable Lead Location: 753860
Implantable Lead Model: 4598
Implantable Lead Model: 5076
Implantable Lead Model: 6935
Implantable Pulse Generator Implant Date: 20190813
Lead Channel Impedance Value: 180.5 Ohm
Lead Channel Impedance Value: 189.525
Lead Channel Impedance Value: 201.488
Lead Channel Impedance Value: 201.488
Lead Channel Impedance Value: 212.8 Ohm
Lead Channel Impedance Value: 361 Ohm
Lead Channel Impedance Value: 361 Ohm
Lead Channel Impedance Value: 399 Ohm
Lead Channel Impedance Value: 399 Ohm
Lead Channel Impedance Value: 418 Ohm
Lead Channel Impedance Value: 456 Ohm
Lead Channel Impedance Value: 456 Ohm
Lead Channel Impedance Value: 456 Ohm
Lead Channel Impedance Value: 608 Ohm
Lead Channel Impedance Value: 646 Ohm
Lead Channel Impedance Value: 703 Ohm
Lead Channel Impedance Value: 703 Ohm
Lead Channel Impedance Value: 722 Ohm
Lead Channel Pacing Threshold Amplitude: 0.5 V
Lead Channel Pacing Threshold Amplitude: 0.75 V
Lead Channel Pacing Threshold Amplitude: 1.625 V
Lead Channel Pacing Threshold Pulse Width: 0.4 ms
Lead Channel Pacing Threshold Pulse Width: 0.4 ms
Lead Channel Pacing Threshold Pulse Width: 0.4 ms
Lead Channel Sensing Intrinsic Amplitude: 1 mV
Lead Channel Sensing Intrinsic Amplitude: 1 mV
Lead Channel Sensing Intrinsic Amplitude: 7.25 mV
Lead Channel Sensing Intrinsic Amplitude: 7.25 mV
Lead Channel Setting Pacing Amplitude: 1.5 V
Lead Channel Setting Pacing Amplitude: 2.25 V
Lead Channel Setting Pacing Amplitude: 2.5 V
Lead Channel Setting Pacing Pulse Width: 0.4 ms
Lead Channel Setting Pacing Pulse Width: 0.4 ms
Lead Channel Setting Sensing Sensitivity: 0.3 mV

## 2020-08-06 NOTE — Progress Notes (Signed)
Remote ICD transmission.   

## 2020-08-07 ENCOUNTER — Other Ambulatory Visit: Payer: Self-pay

## 2020-08-07 ENCOUNTER — Ambulatory Visit (INDEPENDENT_AMBULATORY_CARE_PROVIDER_SITE_OTHER): Payer: Medicare HMO | Admitting: Internal Medicine

## 2020-08-07 ENCOUNTER — Encounter: Payer: Self-pay | Admitting: Internal Medicine

## 2020-08-07 VITALS — BP 100/64 | HR 75 | Ht 70.0 in | Wt 173.0 lb

## 2020-08-07 DIAGNOSIS — Z95810 Presence of automatic (implantable) cardiac defibrillator: Secondary | ICD-10-CM

## 2020-08-07 DIAGNOSIS — I5022 Chronic systolic (congestive) heart failure: Secondary | ICD-10-CM | POA: Diagnosis not present

## 2020-08-07 DIAGNOSIS — I447 Left bundle-branch block, unspecified: Secondary | ICD-10-CM

## 2020-08-07 NOTE — Progress Notes (Signed)
HPI [name] returns today for followup. He is a pleasant 69 yo man with a h/o chronic systolic heart failure, s/p biv ICD, LBBB, and HTN. He has a h/o AS and underwent TAVR 3 years ago. He has a h/o tobacco abuse.  No Known Allergies   Current Outpatient Medications  Medication Sig Dispense Refill  . aspirin 81 MG tablet Take 1 tablet (81 mg total) by mouth daily.    Marland Kitchen atorvastatin (LIPITOR) 20 MG tablet TAKE 1 TABLET BY MOUTH ONCE DAILY AT 6 PM 90 tablet 3  . clonazePAM (KLONOPIN) 0.5 MG tablet Take 0.5 mg by mouth daily as needed for anxiety.     Marland Kitchen ENTRESTO 97-103 MG Take 1 tablet by mouth twice daily 60 tablet 0  . MELATONIN GUMMIES PO Take by mouth. 2 gummies nightly    . metoprolol succinate (TOPROL-XL) 25 MG 24 hr tablet Take 1 tablet (25 mg total) by mouth daily. 90 tablet 3  . Multiple Vitamins-Minerals (MULTIVITAMIN ADULT) CHEW Chew 1 each by mouth daily.    Marland Kitchen spironolactone (ALDACTONE) 25 MG tablet Take 12.5 mg by mouth as needed.    . tamsulosin (FLOMAX) 0.4 MG CAPS capsule Take 1 capsule (0.4 mg total) by mouth daily after supper. 30 capsule 6   No current facility-administered medications for this visit.     Past Medical History:  Diagnosis Date  . AICD (automatic cardioverter/defibrillator) present 06/19/2018  . Anxiety   . Aortic stenosis, severe    a. suspect low flow, low gradient severe AS.   Marland Kitchen Arthritis   . Chronic kidney disease   . COPD (chronic obstructive pulmonary disease) ([place])   . Coronary artery disease   . Family history of adverse reaction to anesthesia    /son "passed out multiple times after anesthesia"  . Frequency of urination   . Heart murmur    "not since valve was replaced" (06/19/2018)  . High cholesterol   . Hypertension   . IBD (inflammatory bowel disease)    a. s/p partial colectomy in 2009  . Incidental pulmonary nodule, > 82m and < 835m11/11/2016   Several small nodules in left lung  . LBBB (left bundle branch block)   .  NICM (nonischemic cardiomyopathy) ([place]   a. 08/2014: EF 15% suspect to be endstage CM 2/2 severe AS  . Pneumonia 08/2017  . S/P aortic valve replacement with bioprosthetic valve 10/18/2017   [facility] stented bovine pericardial tissue valve  . Squamous carcinoma    ight ear  . Tobacco abuse     ROS:   All systems reviewed and negative except as noted in the HPI.   Past Surgical History:  Procedure Laterality Date  . AORTIC VALVE REPLACEMENT N/A 10/18/2017   Procedure: AORTIC VALVE REPLACEMENT;  Surgeon: OwRexene AlbertsMD;  [place] [facility] Service: Open [facility] Surgery;  Laterality: N/A;  . BIV ICD INSERTION CRT-D N/A 06/19/2018   Procedure: BIV ICD INSERTION CRT-D;  Surgeon: TaEvans LanceMD;  [place] [facility];  Service: Cardiovascular;  Laterality: N/A;  . CARDIAC VALVE REPLACEMENT    . COLECTOMY  2009   bowel resection, for blockage  . INGUINAL HERNIA REPAIR Left 1965  . INGUINAL HERNIA REPAIR Right 08/27/2018   Procedure: RIGHT INGUINAL HERNIA REPAIR WITH MESH;  Surgeon: ToJovita KussmaulMD;  [place] [facility] Service: General;  Laterality: Right;  . INSERTION OF MESH Right 08/27/2018  Procedure: INSERTION OF MESH;  Surgeon: Jovita Kussmaul, MD;  Location: [place];  Service: General;  Laterality: Right;  . IR FLUORO GUIDE CV MIDLINE PICC LEFT  09/04/2017  . RIGHT/LEFT HEART CATH AND CORONARY ANGIOGRAPHY N/A 09/06/2017   Procedure: RIGHT/LEFT HEART CATH AND CORONARY ANGIOGRAPHY;  Surgeon: Jolaine Artist, MD;  Location: [place] CV LAB;  Service: Cardiovascular;  Laterality: N/A;  . SQUAMOUS CELL CARCINOMA EXCISION Right    "ear"  . STERNOTOMY  10/18/2017   Procedure: STERNOTOMY;  Surgeon: Rexene Alberts, MD;  Location: [facility];  Service: Open Heart Surgery;;  . TEE WITHOUT CARDIOVERSION N/A 10/18/2017   Procedure: TRANSESOPHAGEAL ECHOCARDIOGRAM (TEE);  Surgeon: Rexene Alberts, MD;  Location: [place];  Service: Open Heart Surgery;   Laterality: N/A;     Family History  Problem Relation Age of Onset  . Hypertension Father   . Heart attack Father   . Lung cancer Brother      Social History   Socioeconomic History  . Marital status: Divorced    Spouse name: Not on file  . Number of children: Not on file  . Years of education: Not on file  . Highest education level: Not on file  Occupational History  . Not on file  Tobacco Use  . Smoking status: Current Some Day Smoker    Packs/day: 0.50    Years: 53.00    Pack years: 26.50    Types: Cigarettes    Start date: 52  . Smokeless tobacco: Former Systems developer    Types: Secondary school teacher  . Vaping Use: Former  Substance and Sexual Activity  . Alcohol use: Yes    Comment: 06/19/2018 "couple beers/year"  . Drug use: Not Currently    Types: Marijuana  . Sexual activity: Not on file  Other Topics Concern  . Not on file  Social History Narrative  . Not on file   Social Determinants of Health   Financial Resource Strain:   . Difficulty of Paying Living Expenses: Not on file  Food Insecurity:   . Worried About Charity fundraiser in the Last Year: Not on file  . Ran Out of Food in the Last Year: Not on file  Transportation Needs:   . Lack of Transportation (Medical): Not on file  . Lack of Transportation (Non-Medical): Not on file  Physical Activity:   . Days of Exercise per Week: Not on file  . Minutes of Exercise per Session: Not on file  Stress:   . Feeling of Stress : Not on file  Social Connections:   . Frequency of Communication with Friends and Family: Not on file  . Frequency of Social Gatherings with Friends and Family: Not on file  . Attends Religious Services: Not on file  . Active Member of Clubs or Organizations: Not on file  . Attends Archivist Meetings: Not on file  . Marital Status: Not on file  Intimate Partner Violence:   . Fear of Current or Ex-Partner: Not on file  . Emotionally Abused: Not on file  . Physically Abused:  Not on file  . Sexually Abused: Not on file     BP 100/64   Pulse 75   Ht 5' 10"  (1.778 m)   Wt 173 lb (78.5 kg)   SpO2 95%   BMI 24.82 kg/m   Physical Exam:  Well appearing NAD HEENT: Unremarkable Neck:  No JVD, no thyromegally Lymphatics:  No adenopathy Back:  No CVA  tenderness Lungs:  Clear with no wheezes HEART:  Regular rate rhythm, no murmurs, no rubs, no clicks Abd:  soft, positive bowel sounds, no organomegally, no rebound, no guarding Ext:  2 plus pulses, no edema, no cyanosis, no clubbing Skin:  No rashes no nodules Neuro:  CN II through XII intact, motor grossly intact  EKG - nsr with biv pacing  DEVICE  Normal device function.  See PaceArt for details.   Assess/Plan: 1. Chronic systolic heart failure - his symptoms are class 2 under the direction of Dr. Reine Just. He is on maximal guideline directed medical therapy. He will continue his drugs. Of note his EF improved to normal after CRTD and GDMT. 2. Tobacco abuse - he is encouraged to stop smoking. 3. CAD - he denies anginal symptoms. He has non-obstructive disease. 4. ICD - his medtronic Biv ICD has been reprogrammed to maximize battery longevity.   Carleene Overlie Cordai Rodrigue,MD

## 2020-08-07 NOTE — Patient Instructions (Signed)
Medication Instructions:  Your physician recommends that you continue on your current medications as directed. Please refer to the Current Medication list given to you today.  Labwork: None ordered.  Testing/Procedures: None ordered.  Follow-Up: Your physician wants you to follow-up in: one year with Dr. Lovena Le.   You will receive a reminder letter in the mail two months in advance. If you don't receive a letter, please call our office to schedule the follow-up appointment.  Remote monitoring is used to monitor your ICD from home. This monitoring reduces the number of office visits required to check your device to one time per year. It allows [place] to keep an eye on the functioning of your device to ensure it is working properly. You are scheduled for a device check from home on 11/02/2020. You may send your transmission at any time that day. If you have a wireless device, the transmission will be sent automatically. After your physician reviews your transmission, you will receive a postcard with your next transmission date.  Any Other Special Instructions Will Be Listed Below (If Applicable).  If you need a refill on your cardiac medications before your next appointment, please call your pharmacy.

## 2020-08-10 NOTE — Addendum Note (Signed)
Addended by: Rose Phi on: 08/10/2020 01:43 PM   Modules accepted: Orders

## 2020-09-03 DIAGNOSIS — I5021 Acute systolic (congestive) heart failure: Secondary | ICD-10-CM | POA: Diagnosis not present

## 2020-09-03 DIAGNOSIS — E785 Hyperlipidemia, unspecified: Secondary | ICD-10-CM | POA: Diagnosis not present

## 2020-09-03 DIAGNOSIS — J449 Chronic obstructive pulmonary disease, unspecified: Secondary | ICD-10-CM | POA: Diagnosis not present

## 2020-09-04 ENCOUNTER — Other Ambulatory Visit (HOSPITAL_COMMUNITY): Payer: Self-pay | Admitting: Internal Medicine

## 2020-09-13 NOTE — Progress Notes (Signed)
Cardiology Office Note:    Date:  09/15/2020   ID:  THOMAS MABRY, DOB 24-Mar-1951, MRN [id number]  PCP:  Orpah Melter, MD  Cardiologist:  No primary care provider on file.  Electrophysiologist:  None   Referring MD: Jolaine Artist, MD   Chief Complaint  Patient presents with  . Congestive Heart Failure    History of Present Illness:    Kenneth Mills is a 69 y.o. male with a hx of COPD, IBD status post partial colectomy, chronic combined systolic and diastolic heart failure, severe aortic stenosis status post AVR 10/2017.  He was admitted in October 2018 with acute respiratory failure with parainfluenza requiring intubation.  Echocardiogram at that time showed severe LV systolic dysfunction and critical aortic stenosis.  Cardiac catheterization showed nonobstructive CAD, severe AS.  Underwent surgical aortic valve replacement on 10/18/2017.  Underwent CRT D implant on 06/19/2018.  Echocardiogram 08/15/2019 showed EF 55 to 60%.  Since last clinic visit, he reports that he has been doing well.  Denies any chest pain or dyspnea.  Reports he ran out of Entresto 5 days ago.  States that he has had issues affording and requesting alternative medication.  States that he feels less fatigued since he stopped Entresto.  Reports lightheadedness with standing, denies any syncope.  Reports rare palpitations.  Continues to smoke 1 pack/day.  Past Medical History:  Diagnosis Date  . AICD (automatic cardioverter/defibrillator) present 06/19/2018  . Anxiety   . Aortic stenosis, severe    a. suspect low flow, low gradient severe AS.   Marland Kitchen Arthritis   . Chronic kidney disease   . COPD (chronic obstructive pulmonary disease) ([place])   . Coronary artery disease   . Family history of adverse reaction to anesthesia    /son "passed out multiple times after anesthesia"  . Frequency of urination   . Heart murmur    "not since valve was replaced" (06/19/2018)  . High cholesterol   . Hypertension    . IBD (inflammatory bowel disease)    a. s/p partial colectomy in 2009  . Incidental pulmonary nodule, > 40m and < 886m11/11/2016   Several small nodules in left lung  . LBBB (left bundle branch block)   . NICM (nonischemic cardiomyopathy) ([place]   a. 08/2014: EF 15% suspect to be endstage CM 2/2 severe AS  . Pneumonia 08/2017  . S/P aortic valve replacement with bioprosthetic valve 10/18/2017   25 mm [facility] stented bovine pericardial tissue valve  . Squamous carcinoma    ight ear  . Tobacco abuse     Past Surgical History:  Procedure Laterality Date  . AORTIC VALVE REPLACEMENT N/A 10/18/2017   Procedure: AORTIC VALVE REPLACEMENT;  Surgeon: OwRexene AlbertsMD;  [place] [facility] [place] Service: Open Heart Surgery;  Laterality: N/A;  . BIV ICD INSERTION CRT-D N/A 06/19/2018   Procedure: BIV ICD INSERTION CRT-D;  Surgeon: TaEvans LanceMD;  [place] [facility];  Service: Cardiovascular;  Laterality: N/A;  . CARDIAC VALVE REPLACEMENT    . COLECTOMY  2009   bowel resection, for blockage  . INGUINAL HERNIA REPAIR Left 1965  . INGUINAL HERNIA REPAIR Right 08/27/2018   Procedure: RIGHT INGUINAL HERNIA REPAIR WITH MESH;  Surgeon: ToJovita KussmaulMD;  [place] [facility] Service: General;  Laterality: Right;  . INSERTION OF MESH Right 08/27/2018   Procedure: INSERTION OF MESH;  Surgeon: ToJovita KussmaulMD;  [place] [facility] Service:  General;  Laterality: Right;  . IR FLUORO GUIDE CV MIDLINE PICC LEFT  09/04/2017  . RIGHT/LEFT HEART CATH AND CORONARY ANGIOGRAPHY N/A 09/06/2017   Procedure: RIGHT/LEFT HEART CATH AND CORONARY ANGIOGRAPHY;  Surgeon: Jolaine Artist, MD;  Location: [place] CV LAB;  Service: Cardiovascular;  Laterality: N/A;  . SQUAMOUS CELL CARCINOMA EXCISION Right    "ear"  . STERNOTOMY  10/18/2017   Procedure: STERNOTOMY;  Surgeon: Rexene Alberts, MD;  Location[facility] OR;  Service: Open Heart Surgery;;  . TEE WITHOUT CARDIOVERSION N/A 10/18/2017    Procedure: TRANSESOPHAGEAL ECHOCARDIOGRAM (TEE);  Surgeon: Rexene Alberts, MD;  Location: [place];  Service: Open Heart Surgery;  Laterality: N/A;    Current Medications: Current Meds  Medication Sig  . aspirin 81 MG tablet Take 1 tablet (81 mg total) by mouth daily.  Marland Kitchen atorvastatin (LIPITOR) 20 MG tablet TAKE 1 TABLET BY MOUTH ONCE DAILY AT 6 PM  . clonazePAM (KLONOPIN) 0.5 MG tablet Take 0.5 mg by mouth daily as needed for anxiety.   Marland Kitchen MELATONIN GUMMIES PO Take by mouth. 2 gummies nightly  . metoprolol succinate (TOPROL-XL) 25 MG 24 hr tablet Take 1 tablet (25 mg total) by mouth daily.  . Multiple Vitamins-Minerals (MULTIVITAMIN ADULT) CHEW Chew 1 each by mouth daily.  Marland Kitchen spironolactone (ALDACTONE) 25 MG tablet Take 12.5 mg by mouth as needed.  . tamsulosin (FLOMAX) 0.4 MG CAPS capsule Take 1 capsule (0.4 mg total) by mouth daily after supper.  . [DISCONTINUED] ENTRESTO 97-103 MG Take 1 tablet by mouth twice daily     Allergies:   Patient has no known allergies.   Social History   Socioeconomic History  . Marital status: Divorced    Spouse name: Not on file  . Number of children: Not on file  . Years of education: Not on file  . Highest education level: Not on file  Occupational History  . Not on file  Tobacco Use  . Smoking status: Current Some Day Smoker    Packs/day: 0.50    Years: 53.00    Pack years: 26.50    Types: Cigarettes    Start date: 18  . Smokeless tobacco: Former Systems developer    Types: Secondary school teacher  . Vaping Use: Former  Substance and Sexual Activity  . Alcohol use: Yes    Comment: 06/19/2018 "couple beers/year"  . Drug use: Not Currently    Types: Marijuana  . Sexual activity: Not on file  Other Topics Concern  . Not on file  Social History Narrative  . Not on file   Social Determinants of Health   Financial Resource Strain:   . Difficulty of Paying Living Expenses: Not on file  Food Insecurity:   . Worried About Charity fundraiser in the Last  Year: Not on file  . Ran Out of Food in the Last Year: Not on file  Transportation Needs:   . Lack of Transportation (Medical): Not on file  . Lack of Transportation (Non-Medical): Not on file  Physical Activity:   . Days of Exercise per Week: Not on file  . Minutes of Exercise per Session: Not on file  Stress:   . Feeling of Stress : Not on file  Social Connections:   . Frequency of Communication with Friends and Family: Not on file  . Frequency of Social Gatherings with Friends and Family: Not on file  . Attends Religious Services: Not on file  . Active Member of Clubs or  Organizations: Not on file  . Attends Archivist Meetings: Not on file  . Marital Status: Not on file     Family History: The patient's family history includes Heart attack in his father; Hypertension in his father; Lung cancer in his brother.  ROS:   Please see the history of present illness.     All other systems reviewed and are negative.  EKGs/Labs/Other Studies Reviewed:    The following studies were reviewed today:   EKG:  EKG is ordered today.  The ekg ordered today demonstrates atrial sensed, ventricular paced rhythm  Recent Labs: No results found for requested labs within last 8760 hours.  Recent Lipid Panel    Component Value Date/Time   CHOL 145 09/07/2017 0335   TRIG 90 09/07/2017 0335   HDL 51 09/07/2017 0335   CHOLHDL 2.8 09/07/2017 0335   VLDL 18 09/07/2017 0335   LDLCALC 76 09/07/2017 0335    Physical Exam:    VS:  BP 122/72   Pulse 67   Ht 5' 10.5" (1.791 m)   Wt 172 lb (78 kg)   SpO2 96%   BMI 24.33 kg/m     Wt Readings from Last 3 Encounters:  09/15/20 172 lb (78 kg)  08/07/20 173 lb (78.5 kg)  03/26/20 173 lb 12.8 oz (78.8 kg)     GEN:  Well nourished, well developed in no acute distress HEENT: Normal NECK: No JVD; No carotid bruits LYMPHATICS: No lymphadenopathy CARDIAC: RRR, 2/6 systolic murmur RESPIRATORY:  Clear to auscultation without rales,  wheezing or rhonchi  ABDOMEN: Soft, non-tender, non-distended MUSCULOSKELETAL:  No edema; No deformity  SKIN: Warm and dry NEUROLOGIC:  Alert and oriented x 3 PSYCHIATRIC:  Normal affect   ASSESSMENT:    1. Chronic combined systolic and diastolic heart failure ([place])   2. CAD in native artery   3. S/P aortic valve replacement with bioprosthetic valve   4. LBBB (left bundle branch block)   5. Tobacco abuse    PLAN:    Chronic combined systolic and diastolic CHF: Echo 78/29/56 LVEF 15%, RV normal, critical low gradient AS; TEE 12/18: EF 20-25%, normal RV, mild MR, trace TR.  Echo 02/2018 EF 20-25%. Now s/p CRT-D 06/19/18.  Echo 08/15/19 EF 55-60% complete recovery after CRT-D - Continue lasix 15m prn  - Has had issues affording Entresto and is requesting alternative medication.  Will start losartan 50 mg daily and check BMP in 1 week - ContinueToprol XL 25 mg  - Off spiro by his choice  Aortic stenosis: s/p bioprosthetic AVR 10/18/2017.  No symptoms to suggest prosthetic valve dysfunction  Tobacco abuse: smoking 1ppd.  Counseled on the risks of tobacco use and cessation strongly encouraged.  Will ask care guide to work with patient on smoking cessation  CAD: Mild non-obstructive CAD with 50% proximal RCA with spasm on cath 09/06/17.  No anginal symptoms. - Continue atorvastatin 20 mg daily + ASA 81 mg daily  LBBB: S/p CRT-D 06/19/2018.  EF improved. - Follows with EP  RTC in 6 months  Medication Adjustments/Labs and Tests Ordered: Current medicines are reviewed at length with the patient today.  Concerns regarding medicines are outlined above.  Orders Placed This Encounter  Procedures  . Basic metabolic panel  . EKG 12-Lead   Meds ordered this encounter  Medications  . losartan (COZAAR) 50 MG tablet    Sig: Take 1 tablet (50 mg total) by mouth daily.    Dispense:  90 tablet  Refill:  3    STOP Entresto    Patient Instructions  Medication Instructions:  STOP  Entresto START Losartan 50 mg daily  *If you need a refill on your cardiac medications before your next appointment, please call your pharmacy*   Lab Work: RETURN FOR LAB WORK IN 1 WEEK (BMET)  Follow-Up: At [facility], you and your health needs are our priority.  As part of our continuing mission to provide you with exceptional heart care, we have created designated Provider Care Teams.  These Care Teams include your primary Cardiologist (physician) and Advanced Practice Providers (APPs -  Physician Assistants and Nurse Practitioners) who all work together to provide you with the care you need, when you need it.  We recommend signing up for the patient portal called "MyChart".  Sign up information is provided on this After Visit Summary.  MyChart is used to connect with patients for Virtual Visits (Telemedicine).  Patients are able to view lab/test results, encounter notes, upcoming appointments, etc.  Non-urgent messages can be sent to your provider as well.   To learn more about what you can do with MyChart, go to [email or web address].    Your next appointment:   6 month(s)  The format for your next appointment:   In Person  Provider:   Oswaldo Milian, MD    You have been referred to: Amy our careguide to assist with smoking cessation     Signed, Donato Heinz, MD  09/15/2020 1:12 PM    [place]

## 2020-09-15 ENCOUNTER — Other Ambulatory Visit: Payer: Self-pay

## 2020-09-15 ENCOUNTER — Ambulatory Visit: Payer: Medicare HMO | Admitting: Cardiology

## 2020-09-15 ENCOUNTER — Telehealth: Payer: Self-pay

## 2020-09-15 ENCOUNTER — Encounter: Payer: Self-pay | Admitting: Cardiology

## 2020-09-15 VITALS — BP 122/72 | HR 67 | Ht 70.5 in | Wt 172.0 lb

## 2020-09-15 DIAGNOSIS — I447 Left bundle-branch block, unspecified: Secondary | ICD-10-CM | POA: Diagnosis not present

## 2020-09-15 DIAGNOSIS — Z953 Presence of xenogenic heart valve: Secondary | ICD-10-CM

## 2020-09-15 DIAGNOSIS — I5042 Chronic combined systolic (congestive) and diastolic (congestive) heart failure: Secondary | ICD-10-CM

## 2020-09-15 DIAGNOSIS — I2510 Atherosclerotic heart disease of native coronary artery without angina pectoris: Secondary | ICD-10-CM

## 2020-09-15 DIAGNOSIS — Z720 Tobacco use: Secondary | ICD-10-CM

## 2020-09-15 DIAGNOSIS — Z0000 Encounter for general adult medical examination without abnormal findings: Secondary | ICD-10-CM

## 2020-09-15 MED ORDER — LOSARTAN POTASSIUM 50 MG PO TABS: 50.0000 mg | ORAL_TABLET | Freq: Every day | ORAL | 3 refills | Status: DC

## 2020-09-15 NOTE — Telephone Encounter (Signed)
Called patient to discuss health coaching for smoking cessation per Dr. Oswaldo Milian referral. Patient is aware of the service that the Care Guide provide to patients for health coaching. However, patient is interested in quitting cold [place]. Patient was given Care Guide's contact information [phone]) for future assistance if they reconsider.

## 2020-09-15 NOTE — Patient Instructions (Addendum)
Medication Instructions:  STOP Entresto START Losartan 50 mg daily  *If you need a refill on your cardiac medications before your next appointment, please call your pharmacy*   Lab Work: RETURN FOR LAB WORK IN 1 WEEK (BMET)  Follow-Up: At [facility], you and your health needs are our priority.  As part of our continuing mission to provide you with exceptional heart care, we have created designated Provider Care Teams.  These Care Teams include your primary Cardiologist (physician) and Advanced Practice Providers (APPs -  Physician Assistants and Nurse Practitioners) who all work together to provide you with the care you need, when you need it.  We recommend signing up for the patient portal called "MyChart".  Sign up information is provided on this After Visit Summary.  MyChart is used to connect with patients for Virtual Visits (Telemedicine).  Patients are able to view lab/test results, encounter notes, upcoming appointments, etc.  Non-urgent messages can be sent to your provider as well.   To learn more about what you can do with MyChart, go to [email or web address].    Your next appointment:   6 month(s)  The format for your next appointment:   In Person  Provider:   Oswaldo Milian, MD    You have been referred to: Amy our careguide to assist with smoking cessation

## 2020-09-22 DIAGNOSIS — I5022 Chronic systolic (congestive) heart failure: Secondary | ICD-10-CM | POA: Diagnosis not present

## 2020-09-23 ENCOUNTER — Encounter: Payer: Self-pay | Admitting: *Deleted

## 2020-09-23 LAB — BASIC METABOLIC PANEL
BUN/Creatinine Ratio: 13 (ref 10–24)
BUN: 12 mg/dL (ref 8–27)
CO2: 23 mmol/L (ref 20–29)
Calcium: 9.1 mg/dL (ref 8.6–10.2)
Chloride: 104 mmol/L (ref 96–106)
Creatinine, Ser: 0.96 mg/dL (ref 0.76–1.27)
GFR calc Af Amer: 93 mL/min/{1.73_m2} (ref >59)
GFR calc non Af Amer: 80 mL/min/{1.73_m2} (ref >59)
Glucose: 94 mg/dL (ref 65–99)
Potassium: 4.2 mmol/L (ref 3.5–5.2)
Sodium: 141 mmol/L (ref 134–144)

## 2020-09-29 DIAGNOSIS — L570 Actinic keratosis: Secondary | ICD-10-CM | POA: Diagnosis not present

## 2020-09-29 DIAGNOSIS — L219 Seborrheic dermatitis, unspecified: Secondary | ICD-10-CM | POA: Diagnosis not present

## 2020-11-02 ENCOUNTER — Other Ambulatory Visit: Payer: Self-pay

## 2020-11-02 ENCOUNTER — Telehealth: Payer: Self-pay | Admitting: Internal Medicine

## 2020-11-02 ENCOUNTER — Ambulatory Visit (INDEPENDENT_AMBULATORY_CARE_PROVIDER_SITE_OTHER): Payer: Medicare HMO

## 2020-11-02 DIAGNOSIS — I428 Other cardiomyopathies: Secondary | ICD-10-CM

## 2020-11-02 DIAGNOSIS — I5042 Chronic combined systolic (congestive) and diastolic (congestive) heart failure: Secondary | ICD-10-CM | POA: Diagnosis not present

## 2020-11-02 LAB — CUP PACEART REMOTE DEVICE CHECK
Battery Remaining Longevity: 38 mo
Battery Voltage: 2.95 V
Brady Statistic AP VP Percent: 29.98 %
Brady Statistic AP VS Percent: 0.03 %
Brady Statistic AS VP Percent: 69.76 %
Brady Statistic AS VS Percent: 0.23 %
Brady Statistic RA Percent Paced: 29.7 %
Brady Statistic RV Percent Paced: 98.38 %
Date Time Interrogation Session: 20211227063422
HighPow Impedance: 69 Ohm
Implantable Lead Implant Date: 20190813
Implantable Lead Implant Date: 20190813
Implantable Lead Implant Date: 20190813
Implantable Lead Location: 753858
Implantable Lead Location: 753859
Implantable Lead Location: 753860
Implantable Lead Model: 4598
Implantable Lead Model: 5076
Implantable Lead Model: 6935
Implantable Pulse Generator Implant Date: 20190813
Lead Channel Impedance Value: 175.622
Lead Channel Impedance Value: 180.5 Ohm
Lead Channel Impedance Value: 195.429
Lead Channel Impedance Value: 201.488
Lead Channel Impedance Value: 201.488
Lead Channel Impedance Value: 342 Ohm
Lead Channel Impedance Value: 361 Ohm
Lead Channel Impedance Value: 361 Ohm
Lead Channel Impedance Value: 361 Ohm
Lead Channel Impedance Value: 399 Ohm
Lead Channel Impedance Value: 418 Ohm
Lead Channel Impedance Value: 418 Ohm
Lead Channel Impedance Value: 456 Ohm
Lead Channel Impedance Value: 589 Ohm
Lead Channel Impedance Value: 608 Ohm
Lead Channel Impedance Value: 703 Ohm
Lead Channel Impedance Value: 703 Ohm
Lead Channel Impedance Value: 722 Ohm
Lead Channel Pacing Threshold Amplitude: 0.5 V
Lead Channel Pacing Threshold Amplitude: 0.75 V
Lead Channel Pacing Threshold Amplitude: 1.625 V
Lead Channel Pacing Threshold Pulse Width: 0.4 ms
Lead Channel Pacing Threshold Pulse Width: 0.4 ms
Lead Channel Pacing Threshold Pulse Width: 0.4 ms
Lead Channel Sensing Intrinsic Amplitude: 1.5 mV
Lead Channel Sensing Intrinsic Amplitude: 1.5 mV
Lead Channel Sensing Intrinsic Amplitude: 6.75 mV
Lead Channel Sensing Intrinsic Amplitude: 6.75 mV
Lead Channel Setting Pacing Amplitude: 1.5 V
Lead Channel Setting Pacing Amplitude: 2.25 V
Lead Channel Setting Pacing Amplitude: 2.5 V
Lead Channel Setting Pacing Pulse Width: 0.4 ms
Lead Channel Setting Pacing Pulse Width: 0.4 ms
Lead Channel Setting Sensing Sensitivity: 0.3 mV

## 2020-11-02 MED ORDER — LOSARTAN POTASSIUM 50 MG PO TABS: 50.0000 mg | ORAL_TABLET | Freq: Every day | ORAL | 3 refills | Status: DC

## 2020-11-02 NOTE — Telephone Encounter (Signed)
I gave the patient the number to [facility] tech support. His monitor send transmission automatically he did not need to send a manual.

## 2020-11-02 NOTE — Telephone Encounter (Signed)
New message:     Patient receiving code [id number] on his device

## 2020-11-05 DIAGNOSIS — I5022 Chronic systolic (congestive) heart failure: Secondary | ICD-10-CM | POA: Diagnosis not present

## 2020-11-05 DIAGNOSIS — I2510 Atherosclerotic heart disease of native coronary artery without angina pectoris: Secondary | ICD-10-CM | POA: Diagnosis not present

## 2020-11-05 DIAGNOSIS — J449 Chronic obstructive pulmonary disease, unspecified: Secondary | ICD-10-CM | POA: Diagnosis not present

## 2020-11-05 DIAGNOSIS — I5021 Acute systolic (congestive) heart failure: Secondary | ICD-10-CM | POA: Diagnosis not present

## 2020-11-05 DIAGNOSIS — E785 Hyperlipidemia, unspecified: Secondary | ICD-10-CM | POA: Diagnosis not present

## 2020-11-09 ENCOUNTER — Other Ambulatory Visit: Payer: Self-pay

## 2020-11-09 MED ORDER — LOSARTAN POTASSIUM 50 MG PO TABS: 50.0000 mg | ORAL_TABLET | Freq: Every day | ORAL | 3 refills | Status: DC

## 2020-11-13 DIAGNOSIS — R7303 Prediabetes: Secondary | ICD-10-CM | POA: Diagnosis not present

## 2020-11-13 DIAGNOSIS — F419 Anxiety disorder, unspecified: Secondary | ICD-10-CM | POA: Diagnosis not present

## 2020-11-13 NOTE — Progress Notes (Signed)
Remote ICD transmission.   

## 2020-12-24 ENCOUNTER — Other Ambulatory Visit (HOSPITAL_COMMUNITY): Payer: Self-pay | Admitting: Internal Medicine

## 2020-12-25 ENCOUNTER — Telehealth: Payer: Self-pay | Admitting: Cardiology

## 2020-12-25 ENCOUNTER — Encounter: Payer: Self-pay | Admitting: Internal Medicine

## 2020-12-25 MED ORDER — METOPROLOL SUCCINATE ER 25 MG PO TB24
25.0000 mg | ORAL_TABLET | Freq: Every day | ORAL | 3 refills | Status: DC
Start: 2020-12-25 — End: 2021-11-16

## 2020-12-25 NOTE — Telephone Encounter (Signed)
   *  STAT* If patient is at the pharmacy, call can be transferred to refill team.   1. Which medications need to be refilled? (please list name of each medication and dose if known) metoprolol succinate (TOPROL-XL) 25 MG 24 hr tablet  2. Which pharmacy/location (including street and city if local pharmacy) is medication to be sent to? [place], [place]  3. Do they need a 30 day or 90 day supply? 90 days   Pt graduated from Dr. Haroldine Laws

## 2020-12-25 NOTE — Telephone Encounter (Signed)
Error

## 2021-01-02 DIAGNOSIS — E785 Hyperlipidemia, unspecified: Secondary | ICD-10-CM | POA: Diagnosis not present

## 2021-01-02 DIAGNOSIS — I5022 Chronic systolic (congestive) heart failure: Secondary | ICD-10-CM | POA: Diagnosis not present

## 2021-01-02 DIAGNOSIS — I2510 Atherosclerotic heart disease of native coronary artery without angina pectoris: Secondary | ICD-10-CM | POA: Diagnosis not present

## 2021-01-02 DIAGNOSIS — J449 Chronic obstructive pulmonary disease, unspecified: Secondary | ICD-10-CM | POA: Diagnosis not present

## 2021-01-02 DIAGNOSIS — I5021 Acute systolic (congestive) heart failure: Secondary | ICD-10-CM | POA: Diagnosis not present

## 2021-01-04 DIAGNOSIS — L570 Actinic keratosis: Secondary | ICD-10-CM | POA: Diagnosis not present

## 2021-01-04 DIAGNOSIS — L219 Seborrheic dermatitis, unspecified: Secondary | ICD-10-CM | POA: Diagnosis not present

## 2021-01-04 DIAGNOSIS — L821 Other seborrheic keratosis: Secondary | ICD-10-CM | POA: Diagnosis not present

## 2021-02-01 ENCOUNTER — Ambulatory Visit (INDEPENDENT_AMBULATORY_CARE_PROVIDER_SITE_OTHER): Payer: Medicare HMO

## 2021-02-01 DIAGNOSIS — I428 Other cardiomyopathies: Secondary | ICD-10-CM

## 2021-02-01 LAB — CUP PACEART REMOTE DEVICE CHECK
Battery Remaining Longevity: 35 mo
Battery Voltage: 2.95 V
Brady Statistic AP VP Percent: 31.86 %
Brady Statistic AP VS Percent: 0.03 %
Brady Statistic AS VP Percent: 67.94 %
Brady Statistic AS VS Percent: 0.18 %
Brady Statistic RA Percent Paced: 31.55 %
Brady Statistic RV Percent Paced: 98.42 %
Date Time Interrogation Session: 20220328043821
HighPow Impedance: 66 Ohm
Implantable Lead Implant Date: 20190813
Implantable Lead Implant Date: 20190813
Implantable Lead Implant Date: 20190813
Implantable Lead Location: 753858
Implantable Lead Location: 753859
Implantable Lead Location: 753860
Implantable Lead Model: 4598
Implantable Lead Model: 5076
Implantable Lead Model: 6935
Implantable Pulse Generator Implant Date: 20190813
Lead Channel Impedance Value: 171 Ohm
Lead Channel Impedance Value: 175.622
Lead Channel Impedance Value: 184.154
Lead Channel Impedance Value: 184.154
Lead Channel Impedance Value: 189.525
Lead Channel Impedance Value: 342 Ohm
Lead Channel Impedance Value: 342 Ohm
Lead Channel Impedance Value: 361 Ohm
Lead Channel Impedance Value: 361 Ohm
Lead Channel Impedance Value: 399 Ohm
Lead Channel Impedance Value: 399 Ohm
Lead Channel Impedance Value: 399 Ohm
Lead Channel Impedance Value: 418 Ohm
Lead Channel Impedance Value: 589 Ohm
Lead Channel Impedance Value: 608 Ohm
Lead Channel Impedance Value: 608 Ohm
Lead Channel Impedance Value: 646 Ohm
Lead Channel Impedance Value: 665 Ohm
Lead Channel Pacing Threshold Amplitude: 0.5 V
Lead Channel Pacing Threshold Amplitude: 0.875 V
Lead Channel Pacing Threshold Amplitude: 1.5 V
Lead Channel Pacing Threshold Pulse Width: 0.4 ms
Lead Channel Pacing Threshold Pulse Width: 0.4 ms
Lead Channel Pacing Threshold Pulse Width: 0.4 ms
Lead Channel Sensing Intrinsic Amplitude: 1 mV
Lead Channel Sensing Intrinsic Amplitude: 1 mV
Lead Channel Sensing Intrinsic Amplitude: 5.875 mV
Lead Channel Sensing Intrinsic Amplitude: 5.875 mV
Lead Channel Setting Pacing Amplitude: 1.5 V
Lead Channel Setting Pacing Amplitude: 2 V
Lead Channel Setting Pacing Amplitude: 2.5 V
Lead Channel Setting Pacing Pulse Width: 0.4 ms
Lead Channel Setting Pacing Pulse Width: 0.4 ms
Lead Channel Setting Sensing Sensitivity: 0.3 mV

## 2021-02-15 NOTE — Progress Notes (Signed)
Remote ICD transmission.   

## 2021-02-19 DIAGNOSIS — I2510 Atherosclerotic heart disease of native coronary artery without angina pectoris: Secondary | ICD-10-CM | POA: Diagnosis not present

## 2021-02-19 DIAGNOSIS — I5021 Acute systolic (congestive) heart failure: Secondary | ICD-10-CM | POA: Diagnosis not present

## 2021-02-19 DIAGNOSIS — J449 Chronic obstructive pulmonary disease, unspecified: Secondary | ICD-10-CM | POA: Diagnosis not present

## 2021-02-19 DIAGNOSIS — E785 Hyperlipidemia, unspecified: Secondary | ICD-10-CM | POA: Diagnosis not present

## 2021-02-19 DIAGNOSIS — I5022 Chronic systolic (congestive) heart failure: Secondary | ICD-10-CM | POA: Diagnosis not present

## 2021-03-15 ENCOUNTER — Other Ambulatory Visit (HOSPITAL_COMMUNITY): Payer: Self-pay | Admitting: Internal Medicine

## 2021-03-16 NOTE — Progress Notes (Deleted)
Cardiology Office Note:    Date:  03/16/2021   ID:  CHIJIOKE Mills, DOB May 04, 1951, MRN [id number]  PCP:  Orpah Melter, MD  Cardiologist:  None  Electrophysiologist:  None   Referring MD: Orpah Melter, MD   No chief complaint on file.   History of Present Illness:    Kenneth Mills is a 70 y.o. male with a hx of COPD, IBD status post partial colectomy, chronic combined systolic and diastolic heart failure, severe aortic stenosis status post AVR 10/2017.  He was admitted in October 2018 with acute respiratory failure with parainfluenza requiring intubation.  Echocardiogram at that time showed severe LV systolic dysfunction and critical aortic stenosis.  Cardiac catheterization showed nonobstructive CAD, severe AS.  Underwent surgical aortic valve replacement on 10/18/2017.  Underwent CRT D implant on 06/19/2018.  Echocardiogram 08/15/2019 showed EF 55 to 60%.  Since last clinic visit,   he reports that he has been doing well.  Denies any chest pain or dyspnea.  Reports he ran out of Entresto 5 days ago.  States that he has had issues affording and requesting alternative medication.  States that he feels less fatigued since he stopped Entresto.  Reports lightheadedness with standing, denies any syncope.  Reports rare palpitations.  Continues to smoke 1 pack/day.  Past Medical History:  Diagnosis Date  . AICD (automatic cardioverter/defibrillator) present 06/19/2018  . Anxiety   . Aortic stenosis, severe    a. suspect low flow, low gradient severe AS.   Marland Kitchen Arthritis   . Chronic kidney disease   . COPD (chronic obstructive pulmonary disease) ([place])   . Coronary artery disease   . Family history of adverse reaction to anesthesia    /son "passed out multiple times after anesthesia"  . Frequency of urination   . Heart murmur    "not since valve was replaced" (06/19/2018)  . High cholesterol   . Hypertension   . IBD (inflammatory bowel disease)    a. s/p partial colectomy in 2009   . Incidental pulmonary nodule, > 5m and < 835m11/11/2016   Several small nodules in left lung  . LBBB (left bundle branch block)   . NICM (nonischemic cardiomyopathy) ([place] [place]   a. 08/2014: EF 15% suspect to be endstage CM 2/2 severe AS  . Pneumonia 08/2017  . S/P aortic valve replacement with bioprosthetic valve 10/18/2017   [facility] st[facility] bovine pericardial tissue valve  . Squamous carcinoma    ight ear  . Tobacco abuse     Past Surgical History:  Procedure Laterality Date  . AORTIC VALVE REPLACEMENT N/A 10/18/2017   Procedure: AORTIC VALVE REPLACEMENT;  Surgeon: OwRexene AlbertsMD;  [place] [facility] Service: Open Heart Surgery;  Laterality: N/A;  . BIV ICD INSERTION CRT-D N/A 06/19/2018   Procedure: BIV ICD INSERTION CRT-D;  Surgeon: TaEvans LanceMD;  [facility];  Service: Cardiovascular;  Laterality: N/A;  . CARDIAC VALVE REPLACEMENT    . COLECTOMY  2009   bowel resection, for blockage  . INGUINAL HERNIA REPAIR Left 1965  . INGUINAL HERNIA REPAIR Right 08/27/2018   Procedure: RIGHT INGUINAL HERNIA REPAIR WITH MESH;  Surgeon: ToJovita KussmaulMD;  [place]: [facility] Service: General;  Laterality: Right;  . INSERTION OF MESH Right 08/27/2018   Procedure: INSERTION OF MESH;  Surgeon: ToJovita KussmaulMD;  [place] [facility] Service: General;  Laterality: Right;  . IR FLUORO GUIDE CV MIDLINE  PICC LEFT  09/04/2017  . RIGHT/LEFT HEART CATH AND CORONARY ANGIOGRAPHY N/A 09/06/2017   Procedure: RIGHT/LEFT HEART CATH AND CORONARY ANGIOGRAPHY;  Surgeon: Jolaine Artist, MD;  Location: [place] CV LAB;  Service: Cardiovascular;  Laterality: N/A;  . SQUAMOUS CELL CARCINOMA EXCISION Right    "ear"  . STERNOTOMY  10/18/2017   Procedure: STERNOTOMY;  Surgeon: Rexene Alberts, MD;  [facility]: [facility];  Service: Open Heart Surgery;;  . TEE WITHOUT CARDIOVERSION N/A 10/18/2017   Procedure: TRANSESOPHAGEAL ECHOCARDIOGRAM (TEE);  Surgeon: Rexene Alberts,  MD;  Location: [place];  Service: Open Heart Surgery;  Laterality: N/A;    Current Medications: No outpatient medications have been marked as taking for the 03/17/21 encounter (Appointment) with Donato Heinz, MD.     Allergies:   Patient has no known allergies.   Social History   Socioeconomic History  . Marital status: Divorced    Spouse name: Not on file  . Number of children: Not on file  . Years of education: Not on file  . Highest education level: Not on file  Occupational History  . Not on file  Tobacco Use  . Smoking status: Current Some Day Smoker    Packs/day: 0.50    Years: 53.00    Pack years: 26.50    Types: Cigarettes    Start date: 39  . Smokeless tobacco: Former Systems developer    Types: Secondary school teacher  . Vaping Use: Former  Substance and Sexual Activity  . Alcohol use: Yes    Comment: 06/19/2018 "couple beers/year"  . Drug use: Not Currently    Types: Marijuana  . Sexual activity: Not on file  Other Topics Concern  . Not on file  Social History Narrative  . Not on file   Social Determinants of Health   Financial Resource Strain: Not on file  Food Insecurity: Not on file  Transportation Needs: Not on file  Physical Activity: Not on file  Stress: Not on file  Social Connections: Not on file     Family History: The patient's family history includes Heart attack in his father; Hypertension in his father; Lung cancer in his brother.  ROS:   Please see the history of present illness.     All other systems reviewed and are negative.  EKGs/Labs/Other Studies Reviewed:    The following studies were reviewed today:   EKG:  EKG is ordered today.  The ekg ordered today demonstrates atrial sensed, ventricular paced rhythm  Recent Labs: 09/22/2020: BUN 12; Creatinine, Ser 0.96; Potassium 4.2; Sodium 141  Recent Lipid Panel    Component Value Date/Time   CHOL 145 09/07/2017 0335   TRIG 90 09/07/2017 0335   HDL 51 09/07/2017 0335   CHOLHDL 2.8  09/07/2017 0335   VLDL 18 09/07/2017 0335   LDLCALC 76 09/07/2017 0335    Physical Exam:    VS:  There were no vitals taken for this visit.    Wt Readings from Last 3 Encounters:  09/15/20 172 lb (78 kg)  08/07/20 173 lb (78.5 kg)  03/26/20 173 lb 12.8 oz (78.8 kg)     GEN:  Well nourished, well developed in no acute distress HEENT: Normal NECK: No JVD; No carotid bruits LYMPHATICS: No lymphadenopathy CARDIAC: RRR, 2/6 systolic murmur RESPIRATORY:  Clear to auscultation without rales, wheezing or rhonchi  ABDOMEN: Soft, non-tender, non-distended MUSCULOSKELETAL:  No edema; No deformity  SKIN: Warm and dry NEUROLOGIC:  Alert and oriented x 3 PSYCHIATRIC:  Normal affect   ASSESSMENT:    No diagnosis found. PLAN:    Chronic combined systolic and diastolic CHF: Echo 14/78/29 LVEF 15%, RV normal, critical low gradient AS; TEE 12/18: EF 20-25%, normal RV, mild MR, trace TR.  Echo 02/2018 EF 20-25%. Now s/p CRT-D 06/19/18.  Echo 08/15/19 EF 55-60% complete recovery after CRT-D - Continue lasix 70m prn  - Had issues affording Entresto, switched to losartan - ContinueToprol XL 25 mg  - Off spiro by his choice  Aortic stenosis: s/p bioprosthetic AVR 10/18/2017.  No symptoms to suggest prosthetic valve dysfunction  Tobacco abuse: smoking 1ppd.  Counseled on the risks of tobacco use and cessation strongly encouraged.  Asked care guide to work with patient on smoking cessation  CAD: Mild non-obstructive CAD with 50% proximal RCA with spasm on cath 09/06/17.  No anginal symptoms. - Continue atorvastatin 20 mg daily + ASA 81 mg daily  LBBB: S/p CRT-D 06/19/2018.  EF improved. - Follows with EP  RTC in ***  Medication Adjustments/Labs and Tests Ordered: Current medicines are reviewed at length with the patient today.  Concerns regarding medicines are outlined above.  No orders of the defined types were placed in this encounter.  No orders of the defined types were placed in this  encounter.   There are no Patient Instructions on file for this visit.   Signed, CDonato Heinz MD  03/16/2021 11:38 PM    [place]

## 2021-03-17 ENCOUNTER — Ambulatory Visit: Payer: Medicare HMO | Admitting: Cardiology

## 2021-04-20 DIAGNOSIS — I5021 Acute systolic (congestive) heart failure: Secondary | ICD-10-CM | POA: Diagnosis not present

## 2021-04-20 DIAGNOSIS — I5022 Chronic systolic (congestive) heart failure: Secondary | ICD-10-CM | POA: Diagnosis not present

## 2021-04-20 DIAGNOSIS — E785 Hyperlipidemia, unspecified: Secondary | ICD-10-CM | POA: Diagnosis not present

## 2021-04-20 DIAGNOSIS — J449 Chronic obstructive pulmonary disease, unspecified: Secondary | ICD-10-CM | POA: Diagnosis not present

## 2021-04-20 DIAGNOSIS — I2510 Atherosclerotic heart disease of native coronary artery without angina pectoris: Secondary | ICD-10-CM | POA: Diagnosis not present

## 2021-04-25 NOTE — Progress Notes (Signed)
Cardiology Office Note:    Date:  04/28/2021   ID:  Kenneth Mills, DOB [birth year], MRN [id number]  PCP:  Kenneth Melter, MD  Cardiologist:  None  Electrophysiologist:  None   Referring MD: Kenneth Melter, MD   Chief Complaint  Patient presents with   Congestive Heart Failure     History of Present Illness:    Kenneth Mills is a 70 y.o. male with a hx of COPD, IBD status post partial colectomy, chronic combined systolic and diastolic heart failure, severe aortic stenosis status post AVR 10/2017.  He was admitted in October 2018 with acute respiratory failure with parainfluenza requiring intubation.  Echocardiogram at that time showed severe LV systolic dysfunction and critical aortic stenosis.  Cardiac catheterization showed nonobstructive CAD, severe AS.  Underwent surgical aortic valve replacement on 10/18/2017.  Underwent CRT D implant on 06/19/2018.  Echocardiogram 08/15/2019 showed EF 55 to 60%.  Since last clinic visit, he reports he has been doing well.  He does not exercise but does yard work.  Denies any chest pain or dyspnea.  Reports occasional lightheadedness with standing, denies any syncope.  Reports had fluttering feeling in his chest for few minutes last night.  He is due for device interrogation.  Denies any lower extremity edema or weight gain.  Reports BP 110 over 70s when checked at home.  Continues to smoke, 12 to 15 cigarettes/day.  Wt Readings from Last 3 Encounters:  04/28/21 172 lb (78 kg)  09/15/20 172 lb (78 kg)  08/07/20 173 lb (78.5 kg)     Past Medical History:  Diagnosis Date   AICD (automatic cardioverter/defibrillator) present 06/19/2018   Anxiety    Aortic stenosis, severe    a. suspect low flow, low gradient severe AS.    Arthritis    Chronic kidney disease    COPD (chronic obstructive pulmonary disease) (H[facility])    Coronary artery disease    Family history of adverse reaction to anesthesia    /son "passed out multiple times after  anesthesia"   Frequency of urination    Heart murmur    "not since valve was replaced" (06/19/2018)   High cholesterol    Hypertension    IBD (inflammatory bowel disease)    a. s/p partial colectomy in 2009   Incidental pulmonary nodule, > 42m and < 839m11/11/2016   Several small nodules in left lung   LBBB (left bundle branch block)    NICM (nonischemic cardiomyopathy) ([place]   a. 08/2014: EF 15% suspect to be endstage CM 2/2 severe AS   Pneumonia 08/2017   S/P aortic valve replacement with bioprosthetic valve 10/18/2017   25 mm [facility] stented bovine pericardial tissue valve   Squamous carcinoma    ight ear   Tobacco abuse     Past Surgical History:  Procedure Laterality Date   AORTIC VALVE REPLACEMENT N/A 10/18/2017   Procedure: AORTIC VALVE REPLACEMENT;  Surgeon: Kenneth AlbertsMD;  [place] [facility] Service: Open Heart Surgery;  Laterality: N/A;   BIV ICD INSERTION CRT-D N/A 06/19/2018   Procedure: BIV ICD INSERTION CRT-D;  Surgeon: TaEvans LanceMD;  [facility];  Service: Cardiovascular;  Laterality: N/A;   CARDIAC VALVE REPLACEMENT     COLECTOMY  2009   bowel resection, for blockage   INGUINAL HERNIA REPAIR Left 1965   INGUINAL HERNIA REPAIR Right 08/27/2018   Procedure: RIGHT INGUINAL HERNIA REPAIR WITH MESH;  Surgeon: ToAutumn MessingII,  MD;  Location: [place];  Service: General;  Laterality: Right;   INSERTION OF MESH Right 08/27/2018   Procedure: INSERTION OF MESH;  Surgeon: Jovita Kussmaul, MD;  Location: [place];  Service: General;  Laterality: Right;   IR FLUORO GUIDE CV MIDLINE PICC LEFT  09/04/2017   RIGHT/LEFT HEART CATH AND CORONARY ANGIOGRAPHY N/A 09/06/2017   Procedure: RIGHT/LEFT HEART CATH AND CORONARY ANGIOGRAPHY;  Surgeon: Jolaine Artist, MD;  Location: [place] CV LAB;  Service: Cardiovascular;  Laterality: N/A;   SQUAMOUS CELL CARCINOMA EXCISION Right    "ear"   STERNOTOMY  10/18/2017   Procedure: STERNOTOMY;  Surgeon:  Kenneth Alberts, MD;  Location: [place];  Service: Open Heart Surgery;;   TEE WITHOUT CARDIOVERSION N/A 10/18/2017   Procedure: TRANSESOPHAGEAL ECHOCARDIOGRAM (TEE);  Surgeon: Kenneth Alberts, MD;  Location: [place];  Service: Open Heart Surgery;  Laterality: N/A;    Current Medications: Current Meds  Medication Sig   aspirin EC 81 MG tablet 1 tablet   atorvastatin (LIPITOR) 20 MG tablet TAKE 1 TABLET BY MOUTH ONCE DAILY AT 6 PM   clonazePAM (KLONOPIN) 0.5 MG tablet Take 0.5 mg by mouth daily as needed for anxiety.    losartan (COZAAR) 50 MG tablet 1 tablet   MELATONIN GUMMIES PO Take by mouth. 2 gummies nightly   metoprolol succinate (TOPROL-XL) 25 MG 24 hr tablet Take 1 tablet (25 mg total) by mouth daily.   Multiple Vitamins-Minerals (MULTIVITAMIN ADULT) CHEW Chew 1 each by mouth daily.     Allergies:   Patient has no known allergies.   Social History   Socioeconomic History   Marital status: Divorced    Spouse name: Not on file   Number of children: Not on file   Years of education: Not on file   Highest education level: Not on file  Occupational History   Not on file  Tobacco Use   Smoking status: Some Days    Packs/day: 0.50    Years: 53.00    Pack years: 26.50    Types: Cigarettes    Start date: 1966   Smokeless tobacco: Former    Types: Nurse, children's Use: Former  Substance and Sexual Activity   Alcohol use: Yes    Comment: 06/19/2018 "couple beers/year"   Drug use: Not Currently    Types: Marijuana   Sexual activity: Not on file  Other Topics Concern   Not on file  Social History Narrative   Not on file   Social Determinants of Health   Financial Resource Strain: Not on file  Food Insecurity: Not on file  Transportation Needs: Not on file  Physical Activity: Not on file  Stress: Not on file  Social Connections: Not on file     Family History: The patient's family history includes Heart attack in his father; Hypertension in his father;  Lung cancer in his brother.  ROS:   Please see the history of present illness.     All other systems reviewed and are negative.  EKGs/Labs/Other Studies Reviewed:    The following studies were reviewed today:   EKG:  EKG is ordered today.  The ekg ordered today demonstrates atrial sensed, ventricular paced rhythm, rate 70  Recent Labs: 09/22/2020: BUN 12; Creatinine, Ser 0.96; Potassium 4.2; Sodium 141  Recent Lipid Panel    Component Value Date/Time   CHOL 145 09/07/2017 0335   TRIG 90 09/07/2017 0335   HDL 51 09/07/2017 0335  CHOLHDL 2.8 09/07/2017 0335   VLDL 18 09/07/2017 0335   LDLCALC 76 09/07/2017 0335    Physical Exam:    VS:  BP 140/88   Pulse 70   Ht 5' 10.5" (1.791 m)   Wt 172 lb (78 kg)   SpO2 98%   BMI 24.33 kg/m     Wt Readings from Last 3 Encounters:  04/28/21 172 lb (78 kg)  09/15/20 172 lb (78 kg)  08/07/20 173 lb (78.5 kg)     GEN:  Well nourished, well developed in no acute distress HEENT: Normal NECK: No JVD; No carotid bruits LYMPHATICS: No lymphadenopathy CARDIAC: RRR, 2/6 systolic murmur RESPIRATORY:  Clear to auscultation without rales, wheezing or rhonchi  ABDOMEN: Soft, non-tender, non-distended MUSCULOSKELETAL:  No edema; No deformity  SKIN: Warm and dry NEUROLOGIC:  Alert and oriented x 3 PSYCHIATRIC:  Normal affect   ASSESSMENT:    1. Chronic combined systolic and diastolic heart failure ([place])   2. CAD in native artery   3. S/P aortic valve replacement with bioprosthetic valve   4. Biventricular ICD (implantable cardioverter-defibrillator) in place     PLAN:    Chronic combined systolic and diastolic CHF: Echo 24/09/73 LVEF 15%, RV normal, critical low gradient AS; TEE 12/18: EF 20-25%, normal RV, mild MR, trace TR.  Echo 02/2018 EF 20-25%. Now s/p CRT-D 06/19/18.  Echo 08/15/19 EF 55-60% complete recovery after CRT-D - Continue lasix 6m prn  - Switched from EJoslinto losartan 50 mg daily given issues with affording  Entresto - Continue Toprol XL 25 mg  - Off spiro by his choice -Plan repeat echocardiogram at next clinic visit   Aortic stenosis: s/p bioprosthetic AVR 10/18/2017.  No symptoms to suggest prosthetic valve dysfunction  Tobacco abuse: Continues to smoke, currently at 12 to 15 cigarettes/day.  Counseled on the risks of tobacco use and cessation strongly encouraged.  Asked care guide to work with patient on smoking cessation, but patient currently wants to try to quit on his own.   CAD: Mild non-obstructive CAD with 50% proximal RCA with spasm on cath 09/06/17.  No anginal symptoms. - Continue atorvastatin 20 mg daily + ASA 81 mg daily   LBBB: S/p CRT-D 06/19/2018.  EF improved. - Follows with EP.  Reports recent palpitations, is due for remote device interrogation, will follow up results  Hypertension: 140/88 in clinic today, though was 130/70 on repeat.  Reports BP well controlled at home.  Continue losartan and Toprol-XL  RTC in 6 months  Medication Adjustments/Labs and Tests Ordered: Current medicines are reviewed at length with the patient today.  Concerns regarding medicines are outlined above.  Orders Placed This Encounter  Procedures   Comprehensive metabolic panel   CBC   Lipid panel   EKG 12-Lead   ECHOCARDIOGRAM COMPLETE    No orders of the defined types were placed in this encounter.   Patient Instructions  Medication Instructions:  Your physician recommends that you continue on your current medications as directed. Please refer to the Current Medication list given to you today.  *If you need a refill on your cardiac medications before your next appointment, please call your pharmacy*   Lab Work: CMET, CBC, Lipid today  If you have labs (blood work) drawn today and your tests are completely normal, you will receive your results only by: [place](if you have MyChart) OR A paper copy in the mail If you have any lab test that is abnormal or we need to  change  your treatment, we will call you to review the results.   Testing/Procedures: Your physician has requested that you have an echocardiogram in December 2022. Echocardiography is a painless test that uses sound waves to create images of your heart. It provides your doctor with information about the size and shape of your heart and how well your heart's chambers and valves are working. This procedure takes approximately one hour. There are no restrictions for this procedure.   Follow-Up: At [facility], you and your health needs are our priority.  As part of our continuing mission to provide you with exceptional heart care, we have created designated Provider Care Teams.  These Care Teams include your primary Cardiologist (physician) and Advanced Practice Providers (APPs -  Physician Assistants and Nurse Practitioners) who all work together to provide you with the care you need, when you need it.  We recommend signing up for the patient portal called "MyChart".  Sign up information is provided on this After Visit Summary.  MyChart is used to connect with patients for Virtual Visits (Telemedicine).  Patients are able to view lab/test results, encounter notes, upcoming appointments, etc.  Non-urgent messages can be sent to your provider as well.   To learn more about what you can do with MyChart, go to [email or web address].    Your next appointment:   December after echo with Dr. Gardiner Rhyme  Other Instructions Please call the device clinic to discuss issues with transmission: [phone]   Signed, Kenneth Heinz, MD  04/28/2021 9:32 AM    [place]

## 2021-04-28 ENCOUNTER — Ambulatory Visit: Payer: Medicare HMO | Admitting: Cardiology

## 2021-04-28 ENCOUNTER — Encounter: Payer: Self-pay | Admitting: Cardiology

## 2021-04-28 ENCOUNTER — Other Ambulatory Visit: Payer: Self-pay

## 2021-04-28 VITALS — BP 140/88 | HR 70 | Ht 70.5 in | Wt 172.0 lb

## 2021-04-28 DIAGNOSIS — Z953 Presence of xenogenic heart valve: Secondary | ICD-10-CM

## 2021-04-28 DIAGNOSIS — I5042 Chronic combined systolic (congestive) and diastolic (congestive) heart failure: Secondary | ICD-10-CM | POA: Diagnosis not present

## 2021-04-28 DIAGNOSIS — I2510 Atherosclerotic heart disease of native coronary artery without angina pectoris: Secondary | ICD-10-CM | POA: Diagnosis not present

## 2021-04-28 DIAGNOSIS — Z95810 Presence of automatic (implantable) cardiac defibrillator: Secondary | ICD-10-CM

## 2021-04-28 LAB — COMPREHENSIVE METABOLIC PANEL
ALT: 13 IU/L (ref 0–44)
AST: 19 IU/L (ref 0–40)
Albumin/Globulin Ratio: 2.5 — ABNORMAL HIGH (ref 1.2–2.2)
Albumin: 4.7 g/dL (ref 3.8–4.8)
Alkaline Phosphatase: 95 IU/L (ref 44–121)
BUN/Creatinine Ratio: 9 — ABNORMAL LOW (ref 10–24)
BUN: 10 mg/dL (ref 8–27)
Bilirubin Total: 0.6 mg/dL (ref 0.0–1.2)
CO2: 22 mmol/L (ref 20–29)
Calcium: 9.4 mg/dL (ref 8.6–10.2)
Chloride: 102 mmol/L (ref 96–106)
Creatinine, Ser: 1.07 mg/dL (ref 0.76–1.27)
Globulin, Total: 1.9 g/dL (ref 1.5–4.5)
Glucose: 149 mg/dL — ABNORMAL HIGH (ref 65–99)
Potassium: 4.4 mmol/L (ref 3.5–5.2)
Sodium: 141 mmol/L (ref 134–144)
Total Protein: 6.6 g/dL (ref 6.0–8.5)
eGFR: 75 mL/min/{1.73_m2} (ref >59)

## 2021-04-28 LAB — CBC
Hematocrit: 49 % (ref 37.5–51.0)
Hemoglobin: 16.4 g/dL (ref 13.0–17.7)
MCH: 31.5 pg (ref 26.6–33.0)
MCHC: 33.5 g/dL (ref 31.5–35.7)
MCV: 94 fL (ref 79–97)
Platelets: 129 10*3/uL — ABNORMAL LOW (ref 150–450)
RBC: 5.21 x10E6/uL (ref 4.14–5.80)
RDW: 12.6 % (ref 11.6–15.4)
WBC: 7.7 10*3/uL (ref 3.4–10.8)

## 2021-04-28 LAB — LIPID PANEL
Chol/HDL Ratio: 2.8 ratio (ref 0.0–5.0)
Cholesterol, Total: 119 mg/dL (ref 100–199)
HDL: 43 mg/dL (ref >39)
LDL Chol Calc (NIH): 55 mg/dL (ref 0–99)
Triglycerides: 116 mg/dL (ref 0–149)
VLDL Cholesterol Cal: 21 mg/dL (ref 5–40)

## 2021-04-28 NOTE — Patient Instructions (Signed)
Medication Instructions:  Your physician recommends that you continue on your current medications as directed. Please refer to the Current Medication list given to you today.  *If you need a refill on your cardiac medications before your next appointment, please call your pharmacy*   Lab Work: CMET, CBC, Lipid today  If you have labs (blood work) drawn today and your tests are completely normal, you will receive your results only by: [place] (if you have MyChart) OR A paper copy in the mail If you have any lab test that is abnormal or we need to change your treatment, we will call you to review the results.   Testing/Procedures: Your physician has requested that you have an echocardiogram in December 2022. Echocardiography is a painless test that uses sound waves to create images of your heart. It provides your doctor with information about the size and shape of your heart and how well your heart's chambers and valves are working. This procedure takes approximately one hour. There are no restrictions for this procedure.   Follow-Up: At [facility], you and your health needs are our priority.  As part of our continuing mission to provide you with exceptional heart care, we have created designated Provider Care Teams.  These Care Teams include your primary Cardiologist (physician) and Advanced Practice Providers (APPs -  Physician Assistants and Nurse Practitioners) who all work together to provide you with the care you need, when you need it.  We recommend signing up for the patient portal called "MyChart".  Sign up information is provided on this After Visit Summary.  MyChart is used to connect with patients for Virtual Visits (Telemedicine).  Patients are able to view lab/test results, encounter notes, upcoming appointments, etc.  Non-urgent messages can be sent to your provider as well.   To learn more about what you can do with MyChart, go to [email or web address].    Your  next appointment:   December after echo with Dr. Gardiner Rhyme  Other Instructions Please call the device clinic to discuss issues with transmission: [phone]

## 2021-04-30 ENCOUNTER — Encounter: Payer: Self-pay | Admitting: *Deleted

## 2021-05-03 ENCOUNTER — Ambulatory Visit (INDEPENDENT_AMBULATORY_CARE_PROVIDER_SITE_OTHER): Payer: Medicare HMO

## 2021-05-03 DIAGNOSIS — I428 Other cardiomyopathies: Secondary | ICD-10-CM | POA: Diagnosis not present

## 2021-05-04 LAB — CUP PACEART REMOTE DEVICE CHECK
Battery Remaining Longevity: 29 mo
Battery Voltage: 2.94 V
Brady Statistic AP VP Percent: 24.01 %
Brady Statistic AP VS Percent: 0.03 %
Brady Statistic AS VP Percent: 75.94 %
Brady Statistic AS VS Percent: 0.02 %
Brady Statistic RA Percent Paced: 24 %
Brady Statistic RV Percent Paced: 99.71 %
Date Time Interrogation Session: 20220628003421
HighPow Impedance: 78 Ohm
Implantable Lead Implant Date: 20190813
Implantable Lead Implant Date: 20190813
Implantable Lead Implant Date: 20190813
Implantable Lead Location: 753858
Implantable Lead Location: 753859
Implantable Lead Location: 753860
Implantable Lead Model: 4598
Implantable Lead Model: 5076
Implantable Lead Model: 6935
Implantable Pulse Generator Implant Date: 20190813
Lead Channel Impedance Value: 189.525
Lead Channel Impedance Value: 199.5 Ohm
Lead Channel Impedance Value: 205.114
Lead Channel Impedance Value: 216.848
Lead Channel Impedance Value: 216.848
Lead Channel Impedance Value: 361 Ohm
Lead Channel Impedance Value: 361 Ohm
Lead Channel Impedance Value: 399 Ohm
Lead Channel Impedance Value: 399 Ohm
Lead Channel Impedance Value: 418 Ohm
Lead Channel Impedance Value: 456 Ohm
Lead Channel Impedance Value: 475 Ohm
Lead Channel Impedance Value: 475 Ohm
Lead Channel Impedance Value: 646 Ohm
Lead Channel Impedance Value: 665 Ohm
Lead Channel Impedance Value: 722 Ohm
Lead Channel Impedance Value: 722 Ohm
Lead Channel Impedance Value: 760 Ohm
Lead Channel Pacing Threshold Amplitude: 0.5 V
Lead Channel Pacing Threshold Amplitude: 0.875 V
Lead Channel Pacing Threshold Amplitude: 1.625 V
Lead Channel Pacing Threshold Pulse Width: 0.4 ms
Lead Channel Pacing Threshold Pulse Width: 0.4 ms
Lead Channel Pacing Threshold Pulse Width: 0.4 ms
Lead Channel Sensing Intrinsic Amplitude: 1.375 mV
Lead Channel Sensing Intrinsic Amplitude: 1.375 mV
Lead Channel Sensing Intrinsic Amplitude: 6 mV
Lead Channel Sensing Intrinsic Amplitude: 6 mV
Lead Channel Setting Pacing Amplitude: 1.5 V
Lead Channel Setting Pacing Amplitude: 2.25 V
Lead Channel Setting Pacing Amplitude: 2.5 V
Lead Channel Setting Pacing Pulse Width: 0.4 ms
Lead Channel Setting Pacing Pulse Width: 0.4 ms
Lead Channel Setting Sensing Sensitivity: 0.3 mV

## 2021-05-17 DIAGNOSIS — Z1211 Encounter for screening for malignant neoplasm of colon: Secondary | ICD-10-CM | POA: Diagnosis not present

## 2021-05-17 DIAGNOSIS — F419 Anxiety disorder, unspecified: Secondary | ICD-10-CM | POA: Diagnosis not present

## 2021-05-17 DIAGNOSIS — Z125 Encounter for screening for malignant neoplasm of prostate: Secondary | ICD-10-CM | POA: Diagnosis not present

## 2021-05-17 DIAGNOSIS — R7303 Prediabetes: Secondary | ICD-10-CM | POA: Diagnosis not present

## 2021-05-17 DIAGNOSIS — I5021 Acute systolic (congestive) heart failure: Secondary | ICD-10-CM | POA: Diagnosis not present

## 2021-05-17 DIAGNOSIS — J449 Chronic obstructive pulmonary disease, unspecified: Secondary | ICD-10-CM | POA: Diagnosis not present

## 2021-05-17 DIAGNOSIS — Z720 Tobacco use: Secondary | ICD-10-CM | POA: Diagnosis not present

## 2021-05-17 DIAGNOSIS — Z0000 Encounter for general adult medical examination without abnormal findings: Secondary | ICD-10-CM | POA: Diagnosis not present

## 2021-05-18 DIAGNOSIS — Z1211 Encounter for screening for malignant neoplasm of colon: Secondary | ICD-10-CM | POA: Diagnosis not present

## 2021-05-21 NOTE — Progress Notes (Signed)
Remote ICD transmission.   

## 2021-06-26 DIAGNOSIS — E785 Hyperlipidemia, unspecified: Secondary | ICD-10-CM | POA: Diagnosis not present

## 2021-06-26 DIAGNOSIS — I5021 Acute systolic (congestive) heart failure: Secondary | ICD-10-CM | POA: Diagnosis not present

## 2021-06-26 DIAGNOSIS — J449 Chronic obstructive pulmonary disease, unspecified: Secondary | ICD-10-CM | POA: Diagnosis not present

## 2021-06-26 DIAGNOSIS — I5022 Chronic systolic (congestive) heart failure: Secondary | ICD-10-CM | POA: Diagnosis not present

## 2021-06-26 DIAGNOSIS — I2510 Atherosclerotic heart disease of native coronary artery without angina pectoris: Secondary | ICD-10-CM | POA: Diagnosis not present

## 2021-08-02 ENCOUNTER — Ambulatory Visit (INDEPENDENT_AMBULATORY_CARE_PROVIDER_SITE_OTHER): Payer: Medicare HMO

## 2021-08-02 DIAGNOSIS — I428 Other cardiomyopathies: Secondary | ICD-10-CM

## 2021-08-02 LAB — CUP PACEART REMOTE DEVICE CHECK
Battery Remaining Longevity: 26 mo
Battery Voltage: 2.93 V
Brady Statistic AP VP Percent: 26.29 %
Brady Statistic AP VS Percent: 0.03 %
Brady Statistic AS VP Percent: 73.66 %
Brady Statistic AS VS Percent: 0.01 %
Brady Statistic RA Percent Paced: 26.28 %
Brady Statistic RV Percent Paced: 99.75 %
Date Time Interrogation Session: 20220926001804
HighPow Impedance: 84 Ohm
Implantable Lead Implant Date: 20190813
Implantable Lead Implant Date: 20190813
Implantable Lead Implant Date: 20190813
Implantable Lead Location: 753858
Implantable Lead Location: 753859
Implantable Lead Location: 753860
Implantable Lead Model: 4598
Implantable Lead Model: 5076
Implantable Lead Model: 6935
Implantable Pulse Generator Implant Date: 20190813
Lead Channel Impedance Value: 199.5 Ohm
Lead Channel Impedance Value: 199.5 Ohm
Lead Channel Impedance Value: 216.848
Lead Channel Impedance Value: 216.848
Lead Channel Impedance Value: 216.848
Lead Channel Impedance Value: 399 Ohm
Lead Channel Impedance Value: 399 Ohm
Lead Channel Impedance Value: 399 Ohm
Lead Channel Impedance Value: 418 Ohm
Lead Channel Impedance Value: 418 Ohm
Lead Channel Impedance Value: 456 Ohm
Lead Channel Impedance Value: 456 Ohm
Lead Channel Impedance Value: 475 Ohm
Lead Channel Impedance Value: 665 Ohm
Lead Channel Impedance Value: 703 Ohm
Lead Channel Impedance Value: 722 Ohm
Lead Channel Impedance Value: 760 Ohm
Lead Channel Impedance Value: 779 Ohm
Lead Channel Pacing Threshold Amplitude: 0.375 V
Lead Channel Pacing Threshold Amplitude: 0.75 V
Lead Channel Pacing Threshold Amplitude: 1.5 V
Lead Channel Pacing Threshold Pulse Width: 0.4 ms
Lead Channel Pacing Threshold Pulse Width: 0.4 ms
Lead Channel Pacing Threshold Pulse Width: 0.4 ms
Lead Channel Sensing Intrinsic Amplitude: 1.25 mV
Lead Channel Sensing Intrinsic Amplitude: 1.25 mV
Lead Channel Sensing Intrinsic Amplitude: 5.625 mV
Lead Channel Sensing Intrinsic Amplitude: 5.625 mV
Lead Channel Setting Pacing Amplitude: 1.5 V
Lead Channel Setting Pacing Amplitude: 2.25 V
Lead Channel Setting Pacing Amplitude: 2.5 V
Lead Channel Setting Pacing Pulse Width: 0.4 ms
Lead Channel Setting Pacing Pulse Width: 0.4 ms
Lead Channel Setting Sensing Sensitivity: 0.3 mV

## 2021-08-10 NOTE — Progress Notes (Signed)
Remote ICD transmission.   

## 2021-08-24 ENCOUNTER — Other Ambulatory Visit: Payer: Self-pay | Admitting: Cardiology

## 2021-09-01 ENCOUNTER — Ambulatory Visit: Payer: Medicare HMO | Admitting: Internal Medicine

## 2021-09-01 ENCOUNTER — Encounter: Payer: Self-pay | Admitting: Internal Medicine

## 2021-09-01 ENCOUNTER — Other Ambulatory Visit: Payer: Self-pay

## 2021-09-01 VITALS — BP 128/70 | HR 60 | Ht 70.0 in | Wt 171.0 lb

## 2021-09-01 DIAGNOSIS — Z95810 Presence of automatic (implantable) cardiac defibrillator: Secondary | ICD-10-CM

## 2021-09-01 DIAGNOSIS — I428 Other cardiomyopathies: Secondary | ICD-10-CM | POA: Diagnosis not present

## 2021-09-01 DIAGNOSIS — I447 Left bundle-branch block, unspecified: Secondary | ICD-10-CM

## 2021-09-01 NOTE — Patient Instructions (Signed)
Medication Instructions:  Your physician recommends that you continue on your current medications as directed. Please refer to the Current Medication list given to you today.  Labwork: None ordered.  Testing/Procedures: None ordered.  Follow-Up: Your physician wants you to follow-up in: one year with Cristopher Peru, MD or one of the following Advanced Practice Providers on your designated Care Team:   Tommye Standard, [place] Legrand Como "Jonni Sanger" Chalmers Cater, [place]  Remote monitoring is used to monitor your ICD from home. This monitoring reduces the number of office visits required to check your device to one time per year. It allows [place] to keep an eye on the functioning of your device to ensure it is working properly. You are scheduled for a device check from home on 11/02/2021. You may send your transmission at any time that day. If you have a wireless device, the transmission will be sent automatically. After your physician reviews your transmission, you will receive a postcard with your next transmission date.  Any Other Special Instructions Will Be Listed Below (If Applicable).  If you need a refill on your cardiac medications before your next appointment, please call your pharmacy.

## 2021-09-01 NOTE — Progress Notes (Signed)
HPI Mr. Fusselman returns today for followup. He is a pleasant 70 yo man with a h/o chronic systolic heart failure, s/p biv ICD, LBBB, and HTN. He has a h/o AS and underwent TAVR 4 years ago. He has a h/o tobacco abuse.  No Known Allergies   Current Outpatient Medications  Medication Sig Dispense Refill   aspirin EC 81 MG tablet 1 tablet     atorvastatin (LIPITOR) 20 MG tablet TAKE 1 TABLET BY MOUTH ONCE DAILY AT 6 PM 90 tablet 3   clonazePAM (KLONOPIN) 0.5 MG tablet Take 0.5 mg by mouth daily as needed for anxiety.      losartan (COZAAR) 50 MG tablet Take 1 tablet by mouth once daily 90 tablet 2   MELATONIN GUMMIES PO Take by mouth. 2 gummies nightly     metoprolol succinate (TOPROL-XL) 25 MG 24 hr tablet Take 1 tablet (25 mg total) by mouth daily. 90 tablet 3   Multiple Vitamins-Minerals (MULTIVITAMIN ADULT) CHEW Chew 1 each by mouth daily.     No current facility-administered medications for this visit.     Past Medical History:  Diagnosis Date   AICD (automatic cardioverter/defibrillator) present 06/19/2018   Anxiety    Aortic stenosis, severe    a. suspect low flow, low gradient severe AS.    Arthritis    Chronic kidney disease    COPD (chronic obstructive pulmonary disease) (HCC)    Coronary artery disease    Family history of adverse reaction to anesthesia    /son "passed out multiple times after anesthesia"   Frequency of urination    Heart murmur    "not since valve was replaced" (06/19/2018)   High cholesterol    Hypertension    IBD (inflammatory bowel disease)    a. s/p partial colectomy in 2009   Incidental pulmonary nodule, > 53m and < 845m11/11/2016   Several small nodules in left lung   LBBB (left bundle branch block)    NICM (nonischemic cardiomyopathy) ([facility]   a. 08/2014: EF 15% suspect to be endstage CM 2/2 severe AS   Pneumonia 08/2017   S/P aortic valve replacement with bioprosthetic valve 10/18/2017   [facility]  pericardial tissue valve   Squamous carcinoma    ight ear   Tobacco abuse     ROS:   All systems reviewed and negative except as noted in the HPI.   Past Surgical History:  Procedure Laterality Date   AORTIC VALVE REPLACEMENT N/A 10/18/2017   Procedure: AORTIC VALVE REPLACEMENT;  Surgeon: OwRexene AlbertsMD;  [facility] Service: [facility];  Laterality: N/A;   BIV ICD INSERTION CRT-D N/A 06/19/2018   Procedure: BIV ICD INSERTION CRT-D;  Surgeon: TaEvans LanceMD;  [facility];  Service: Cardiovascular;  Laterality: N/A;   CARDIAC VALVE REPLACEMENT     COLECTOMY  2009   bowel resection, for blockage   INGUINAL HERNIA REPAIR Left 1965   INGUINAL HERNIA REPAIR Right 08/27/2018   Procedure: RIGHT INGUINAL HERNIA REPAIR WITH MESH;  Surgeon: ToJovita KussmaulMD;  [place] [facility] Service: General;  Laterality: Right;   INSERTION OF MESH Right 08/27/2018   Procedure: INSERTION OF MESH;  Surgeon: ToJovita KussmaulMD;  [facility] Service: General;  Laterality: Right;   IR FLUORO GUIDE CV MIDLINE PICC LEFT  09/04/2017   RIGHT/LEFT HEART CATH AND CORONARY ANGIOGRAPHY N/A 09/06/2017  Procedure: RIGHT/LEFT HEART CATH AND CORONARY ANGIOGRAPHY;  Surgeon: Jolaine Artist, MD;  Location: [place] CV LAB;  Service: Cardiovascular;  Laterality: N/A;   SQUAMOUS CELL CARCINOMA EXCISION Right    "ear"   STERNOTOMY  10/18/2017   Procedure: STERNOTOMY;  Surgeon: Rexene Alberts, MD;  Location: [place];  Service: Open Heart Surgery;;   TEE WITHOUT CARDIOVERSION N/A 10/18/2017   Procedure: TRANSESOPHAGEAL ECHOCARDIOGRAM (TEE);  Surgeon: Rexene Alberts, MD;  Location: [place];  Service: Open Heart Surgery;  Laterality: N/A;     Family History  Problem Relation Age of Onset   Hypertension Father    Heart attack Father    Lung cancer Brother      Social History   Socioeconomic History   Marital status: Divorced    Spouse name: Not on file   Number  of children: Not on file   Years of education: Not on file   Highest education level: Not on file  Occupational History   Not on file  Tobacco Use   Smoking status: Some Days    Packs/day: 0.50    Years: 53.00    Pack years: 26.50    Types: Cigarettes    Start date: 1966   Smokeless tobacco: Former    Types: Nurse, children's Use: Former  Substance and Sexual Activity   Alcohol use: Yes    Comment: 06/19/2018 "couple beers/year"   Drug use: Not Currently    Types: Marijuana   Sexual activity: Not on file  Other Topics Concern   Not on file  Social History Narrative   Not on file   Social Determinants of Health   Financial Resource Strain: Not on file  Food Insecurity: Not on file  Transportation Needs: Not on file  Physical Activity: Not on file  Stress: Not on file  Social Connections: Not on file  Intimate Partner Violence: Not on file     BP 128/70   Pulse 60   Ht 5' 10"  (1.778 m)   Wt 171 lb (77.6 kg)   SpO2 96%   BMI 24.54 kg/m   Physical Exam:  Well appearing 70 yo man, NAD HEENT: Unremarkable Neck:  No JVD, no thyromegally Lymphatics:  No adenopathy Back:  No CVA tenderness Lungs:  Clear with no wheeze HEART:  Regular rate rhythm, no murmurs, no rubs, no clicks Abd:  soft, positive bowel sounds, no organomegally, no rebound, no guarding Ext:  2 plus pulses, no edema, no cyanosis, no clubbing Skin:  No rashes no nodules Neuro:  CN II through XII intact, motor grossly intact  DEVICE  Normal device function.  See PaceArt for details.   Assess/Plan:  1. Chronic systolic heart failure - his symptoms are class 2 under the direction of Dr. Reine Just. He is on maximal guideline directed medical therapy. He will continue his drugs. Of note his EF improved to normal after CRTD and GDMT. 2. Tobacco abuse - he is encouraged to stop smoking. 3. CAD - he denies anginal symptoms. He has non-obstructive disease. 4. ICD - his medtronic Biv ICD has been  reprogrammed to maximize battery longevity.    Carleene Overlie Princes Finger,MD

## 2021-09-06 DIAGNOSIS — I5021 Acute systolic (congestive) heart failure: Secondary | ICD-10-CM | POA: Diagnosis not present

## 2021-09-06 DIAGNOSIS — I5022 Chronic systolic (congestive) heart failure: Secondary | ICD-10-CM | POA: Diagnosis not present

## 2021-09-06 DIAGNOSIS — E785 Hyperlipidemia, unspecified: Secondary | ICD-10-CM | POA: Diagnosis not present

## 2021-09-06 DIAGNOSIS — I2510 Atherosclerotic heart disease of native coronary artery without angina pectoris: Secondary | ICD-10-CM | POA: Diagnosis not present

## 2021-09-06 DIAGNOSIS — E1159 Type 2 diabetes mellitus with other circulatory complications: Secondary | ICD-10-CM | POA: Diagnosis not present

## 2021-09-06 DIAGNOSIS — J449 Chronic obstructive pulmonary disease, unspecified: Secondary | ICD-10-CM | POA: Diagnosis not present

## 2021-10-07 ENCOUNTER — Other Ambulatory Visit: Payer: Self-pay

## 2021-10-07 ENCOUNTER — Ambulatory Visit (HOSPITAL_COMMUNITY): Payer: Medicare HMO | Attending: Cardiology

## 2021-10-07 DIAGNOSIS — I5042 Chronic combined systolic (congestive) and diastolic (congestive) heart failure: Secondary | ICD-10-CM | POA: Diagnosis not present

## 2021-10-07 LAB — ECHOCARDIOGRAM COMPLETE
AR max vel: 1.75 cm2
AV Area VTI: 1.89 cm2
AV Area mean vel: 1.53 cm2
AV Mean grad: 8 mmHg
AV Peak grad: 14.4 mmHg
Ao pk vel: 1.9 m/s
Area-P 1/2: 2.36 cm2
MV M vel: 4.89 m/s
MV Peak grad: 95.6 mmHg
Radius: 0.4 cm
S' Lateral: 3 cm

## 2021-10-10 NOTE — Progress Notes (Signed)
Cardiology Office Note:    Date:  10/13/2021   ID:  Kenneth Mills, DOB 1951/08/10, MRN [id number]  PCP:  Kenneth Melter, Mills  Cardiologist:  Kenneth Heinz, Mills  Electrophysiologist:  None   Referring Mills: Kenneth Melter, Mills   Chief Complaint  Patient presents with   Follow-up      History of Present Illness:    Kenneth Mills is a 70 y.o. male with a hx of COPD, IBD status post partial colectomy, chronic combined systolic and diastolic heart failure, severe aortic stenosis status post AVR 10/2017.  He was admitted in October 2018 with acute respiratory failure with parainfluenza requiring intubation.  Echocardiogram at that time showed severe LV systolic dysfunction and critical aortic stenosis.  Cardiac catheterization showed nonobstructive CAD, severe AS.  Underwent surgical aortic valve replacement on 10/18/2017.  Underwent CRT D implant on 06/19/2018.  Echocardiogram 08/15/2019 showed EF 55 to 60%.  Echo 10/07/2021 showed EF 55 to 60%, moderate LVH, grade 1 diastolic dysfunction, normal RV function, normal functioning prosthetic aortic valve.  Since last clinic visit, he reports that he is doing well.  Denies any chest pain, dyspnea, lightheadedness, syncope, lower extremity edema, or palpitations.  Walks dog daily.  Smoking about one pack per day.    Wt Readings from Last 3 Encounters:  10/11/21 172 lb 3.2 oz (78.1 kg)  09/01/21 171 lb (77.6 kg)  04/28/21 172 lb (78 kg)     Past Medical History:  Diagnosis Date   AICD (automatic cardioverter/defibrillator) present 06/19/2018   Anxiety    Aortic stenosis, severe    a. suspect low flow, low gradient severe AS.    Arthritis    Chronic kidney disease    COPD (chronic obstructive pulmonary disease) (HCC)    Coronary artery disease    Family history of adverse reaction to anesthesia    /son "passed out multiple times after anesthesia"   Frequency of urination    Heart murmur    "not since valve was replaced"  (06/19/2018)   High cholesterol    Hypertension    IBD (inflammatory bowel disease)    a. s/p partial colectomy in 2009   Incidental pulmonary nodule, > 65m and < 84m11/11/2016   Several small nodules in left lung   LBBB (left bundle branch block)    NIC[facility] (nonischemic cardiomyopathy) ([facility]   a. 08/2014: EF 15% suspect to be endstage CM 2/2 severe AS   Pneumonia 08/2017   S/P aortic valve replacement with bioprosthetic valve 10/18/2017   [facility] stented bovine pericardial tissue valve   Squamous carcinoma    ight ear   Tobacco abuse     Past Surgical History:  Procedure Laterality Date   AORTIC VALVE REPLACEMENT N/A 10/18/2017   Procedure: AORTIC VALVE REPLACEMENT;  Surgeon: Kenneth Mills;  [place] Service: Open Heart Surgery;  Laterality: N/A;   BIV ICD INSERTION CRT-D N/A 06/19/2018   Procedure: BIV ICD INSERTION CRT-D;  Surgeon: Kenneth Mills;  [facility];  Service: Cardiovascular;  Laterality: N/A;   CARDIAC VALVE REPLACEMENT     COLECTOMY  2009   bowel resection, for blockage   INGUINAL HERNIA REPAIR Left 1965   INGUINAL HERNIA REPAIR Right 08/27/2018   Procedure: RIGHT INGUINAL HERNIA REPAIR WITH MESH;  Surgeon: ToJovita KussmaulMD;  [place] [facility] Service: General;  Laterality: Right;   INSERTION OF MESH Right 08/27/2018  Procedure: INSERTION OF MESH;  Surgeon: Kenneth Mills;  Location: [place];  Service: General;  Laterality: Right;   IR FLUORO GUIDE CV MIDLINE PICC LEFT  09/04/2017   RIGHT/LEFT HEART CATH AND CORONARY ANGIOGRAPHY N/A 09/06/2017   Procedure: RIGHT/LEFT HEART CATH AND CORONARY ANGIOGRAPHY;  Surgeon: Kenneth Mills;  Location: [place] CV LAB;  Service: Cardiovascular;  Laterality: N/A;   SQUAMOUS CELL CARCINOMA EXCISION Right    "ear"   STERNOTOMY  10/18/2017   Procedure: STERNOTOMY;  Surgeon: Kenneth Mills;  Location: [place];  Service: Open Heart Surgery;;   TEE WITHOUT  CARDIOVERSION N/A 10/18/2017   Procedure: TRANSESOPHAGEAL ECHOCARDIOGRAM (TEE);  Surgeon: Kenneth Mills;  Location: [place];  Service: Open Heart Surgery;  Laterality: N/A;    Current Medications: Current Meds  Medication Sig   aspirin EC 81 MG tablet 1 tablet   atorvastatin (LIPITOR) 20 MG tablet TAKE 1 TABLET BY MOUTH ONCE DAILY AT 6 PM   clonazePAM (KLONOPIN) 0.5 MG tablet Take 0.5 mg by mouth daily as needed for anxiety.    losartan (COZAAR) 50 MG tablet Take 1 tablet by mouth once daily   MELATONIN GUMMIES PO Take by mouth. 2 gummies nightly   metoprolol succinate (TOPROL-XL) 25 MG 24 hr tablet Take 1 tablet (25 mg total) by mouth daily.   Multiple Vitamins-Minerals (MULTIVITAMIN ADULT) CHEW Chew 1 each by mouth daily.     Allergies:   Patient has no known allergies.   Social History   Socioeconomic History   Marital status: Divorced    Spouse name: Not on file   Number of children: Not on file   Years of education: Not on file   Highest education level: Not on file  Occupational History   Not on file  Tobacco Use   Smoking status: Some Days    Packs/day: 0.50    Years: 53.00    Pack years: 26.50    Types: Cigarettes    Start date: 1966   Smokeless tobacco: Former    Types: Nurse, children's Use: Former  Substance and Sexual Activity   Alcohol use: Yes    Comment: 06/19/2018 "couple beers/year"   Drug use: Not Currently    Types: Marijuana   Sexual activity: Not on file  Other Topics Concern   Not on file  Social History Narrative   Not on file   Social Determinants of Health   Financial Resource Strain: Not on file  Food Insecurity: Not on file  Transportation Needs: Not on file  Physical Activity: Not on file  Stress: Not on file  Social Connections: Not on file     Family History: The patient's family history includes Heart attack in his father; Hypertension in his father; Lung cancer in his brother.  ROS:   Please see the history of  present illness.     All other systems reviewed and are negative.  EKGs/Labs/Other Studies Reviewed:    The following studies were reviewed today:   EKG:   10/11/21: atrial sensed, ventricular paced, rate 62   Recent Labs: 04/28/2021: ALT 13; BUN 10; Creatinine, Ser 1.07; Hemoglobin 16.4; Platelets 129; Potassium 4.4; Sodium 141  Recent Lipid Panel    Component Value Date/Time   CHOL 119 04/28/2021 0925   TRIG 116 04/28/2021 0925   HDL 43 04/28/2021 0925   CHOLHDL 2.8 04/28/2021 0925   CHOLHDL 2.8 09/07/2017 0335   VLDL 18 09/07/2017 0335  LDLCALC 55 04/28/2021 0925    Physical Exam:    VS:  BP 124/76   Pulse 62   Ht 5' 10"  (1.778 m)   Wt 172 lb 3.2 oz (78.1 kg)   SpO2 98%   BMI 24.71 kg/m     Wt Readings from Last 3 Encounters:  10/11/21 172 lb 3.2 oz (78.1 kg)  09/01/21 171 lb (77.6 kg)  04/28/21 172 lb (78 kg)     GEN:  Well nourished, well developed in no acute distress HEENT: Normal NECK: No JVD; No carotid bruits LYMPHATICS: No lymphadenopathy CARDIAC: RRR, 2/6 systolic murmur RESPIRATORY:  Clear to auscultation without rales, wheezing or rhonchi  ABDOMEN: Soft, non-tender, non-distended MUSCULOSKELETAL:  No edema; No deformity  SKIN: Warm and dry NEUROLOGIC:  Alert and oriented x 3 PSYCHIATRIC:  Normal affect   ASSESSMENT:    1. Chronic combined systolic and diastolic heart failure due to valvular disease ([place])   2. Chronic combined systolic and diastolic heart failure ([place])   3. S/P aortic valve replacement with bioprosthetic valve   4. Tobacco abuse   5. CAD in native artery   6. Essential hypertension   7. Hyperlipidemia, unspecified hyperlipidemia type      PLAN:    Chronic combined systolic and diastolic CHF: Echo 80/99/83 LVEF 15%, RV normal, critical low gradient AS; TEE 12/18: EF 20-25%, normal RV, mild MR, trace TR.  Echo 02/2018 EF 20-25%. Now s/p CRT-D 06/19/18.  Echo 08/15/19 EF 55-60% complete recovery after CRT-D.  Echo 10/07/2021  showed EF 55 to 60%, moderate LVH, grade 1 diastolic dysfunction, normal RV function, normal functioning prosthetic aortic valve. - Continue lasix 57m prn  - Continue losartan 50 mg daily.  EDelene Lollwas discontinued due to cost - Continue Toprol XL 25 mg  - Off spiro by his choice   Aortic stenosis: s/p bioprosthetic AVR 10/18/2017.  No symptoms to suggest prosthetic valve dysfunction.  Normal functioning valve on echo 10/07/2021, trivial PVL and mean gradient 8 mmHg.  Tobacco abuse: Continues to smoke, currently at about 1ppd.  Counseled on the risks of tobacco use and cessation strongly encouraged.  Asked care guide to work with patient on smoking cessation, but patient currently wants to try to quit on his own.   CAD: Mild non-obstructive CAD with 50% proximal RCA with spasm on cath 09/06/17.  No anginal symptoms. - Continue atorvastatin 20 mg daily + ASA 81 mg daily   LBBB: S/p CRT-D 06/19/2018.  EF improved. - Follows with EP  Hypertension: Continue losartan and Toprol-XL.  Appears controlled.  Hyperlipidemia: continue atorvastatin 20 mg daily, LDL 55 on 04/28/21.  T2DM: A1c 6.6% on 05/17/21.   RTC in 6 months   Medication Adjustments/Labs and Tests Ordered: Current medicines are reviewed at length with the patient today.  Concerns regarding medicines are outlined above.  Orders Placed This Encounter  Procedures   EKG 12-Lead     No orders of the defined types were placed in this encounter.   Patient Instructions  Medication Instructions:  Your physician recommends that you continue on your current medications as directed. Please refer to the Current Medication list given to you today.  *If you need a refill on your cardiac medications before your next appointment, please call your pharmacy*  Lab Work: NONE ordered at this time of appointment   If you have labs (blood work) drawn today and your tests are completely normal, you will receive your results only  by: [place](if you  have MyChart) OR A paper copy in the mail If you have any lab test that is abnormal or we need to change your treatment, we will call you to review the results.  Testing/Procedures: NONE ordered at this time of appointment   Follow-Up: At [facility], you and your health needs are our priority.  As part of our continuing mission to provide you with exceptional heart care, we have created designated Provider Care Teams.  These Care Teams include your primary Cardiologist (physician) and Advanced Practice Providers (APPs -  Physician Assistants and Nurse Practitioners) who all work together to provide you with the care you need, when you need it.  We recommend signing up for the patient portal called "MyChart".  Sign up information is provided on this After Visit Summary.  MyChart is used to connect with patients for Virtual Visits (Telemedicine).  Patients are able to view lab/test results, encounter notes, upcoming appointments, etc.  Non-urgent messages can be sent to your provider as well.   To learn more about what you can do with MyChart, go to [email or web address].    Your next appointment:   6 month(s)  The format for your next appointment:   In Person  Provider:   Donato Heinz, Mills    Other Instructions    Signed, Kenneth Heinz, Mills  10/13/2021 6:04 PM    [place]

## 2021-10-11 ENCOUNTER — Other Ambulatory Visit: Payer: Self-pay

## 2021-10-11 ENCOUNTER — Ambulatory Visit: Payer: Medicare HMO | Admitting: Cardiology

## 2021-10-11 ENCOUNTER — Encounter: Payer: Self-pay | Admitting: Cardiology

## 2021-10-11 VITALS — BP 124/76 | HR 62 | Ht 70.0 in | Wt 172.2 lb

## 2021-10-11 DIAGNOSIS — I5042 Chronic combined systolic (congestive) and diastolic (congestive) heart failure: Secondary | ICD-10-CM

## 2021-10-11 DIAGNOSIS — Z953 Presence of xenogenic heart valve: Secondary | ICD-10-CM

## 2021-10-11 DIAGNOSIS — Z720 Tobacco use: Secondary | ICD-10-CM

## 2021-10-11 DIAGNOSIS — E785 Hyperlipidemia, unspecified: Secondary | ICD-10-CM | POA: Diagnosis not present

## 2021-10-11 DIAGNOSIS — I10 Essential (primary) hypertension: Secondary | ICD-10-CM

## 2021-10-11 DIAGNOSIS — I2510 Atherosclerotic heart disease of native coronary artery without angina pectoris: Secondary | ICD-10-CM | POA: Diagnosis not present

## 2021-10-11 DIAGNOSIS — I38 Endocarditis, valve unspecified: Secondary | ICD-10-CM | POA: Diagnosis not present

## 2021-10-11 NOTE — Patient Instructions (Signed)
Medication Instructions:  Your physician recommends that you continue on your current medications as directed. Please refer to the Current Medication list given to you today.  *If you need a refill on your cardiac medications before your next appointment, please call your pharmacy*  Lab Work: NONE ordered at this time of appointment   If you have labs (blood work) drawn today and your tests are completely normal, you will receive your results only by: [place] (if you have MyChart) OR A paper copy in the mail If you have any lab test that is abnormal or we need to change your treatment, we will call you to review the results.  Testing/Procedures: NONE ordered at this time of appointment   Follow-Up: At [facility], you and your health needs are our priority.  As part of our continuing mission to provide you with exceptional heart care, we have created designated Provider Care Teams.  These Care Teams include your primary Cardiologist (physician) and Advanced Practice Providers (APPs -  Physician Assistants and Nurse Practitioners) who all work together to provide you with the care you need, when you need it.  We recommend signing up for the patient portal called "MyChart".  Sign up information is provided on this After Visit Summary.  MyChart is used to connect with patients for Virtual Visits (Telemedicine).  Patients are able to view lab/test results, encounter notes, upcoming appointments, etc.  Non-urgent messages can be sent to your provider as well.   To learn more about what you can do with MyChart, go to [email or web address].    Your next appointment:   6 month(s)  The format for your next appointment:   In Person  Provider:   Donato Heinz, MD    Other Instructions

## 2021-11-02 ENCOUNTER — Ambulatory Visit (INDEPENDENT_AMBULATORY_CARE_PROVIDER_SITE_OTHER): Payer: Medicare HMO

## 2021-11-02 DIAGNOSIS — I5042 Chronic combined systolic (congestive) and diastolic (congestive) heart failure: Secondary | ICD-10-CM

## 2021-11-02 LAB — CUP PACEART REMOTE DEVICE CHECK
Battery Remaining Longevity: 25 mo
Battery Voltage: 2.93 V
Brady Statistic AP VP Percent: 49.43 %
Brady Statistic AP VS Percent: 0.01 %
Brady Statistic AS VP Percent: 50.55 %
Brady Statistic AS VS Percent: 0 %
Brady Statistic RA Percent Paced: 49.39 %
Brady Statistic RV Percent Paced: 99.87 %
Date Time Interrogation Session: 20221227054345
HighPow Impedance: 73 Ohm
Implantable Lead Implant Date: 20190813
Implantable Lead Implant Date: 20190813
Implantable Lead Implant Date: 20190813
Implantable Lead Location: 753858
Implantable Lead Location: 753859
Implantable Lead Location: 753860
Implantable Lead Model: 4598
Implantable Lead Model: 5076
Implantable Lead Model: 6935
Implantable Pulse Generator Implant Date: 20190813
Lead Channel Impedance Value: 175.622
Lead Channel Impedance Value: 175.622
Lead Channel Impedance Value: 188.1 Ohm
Lead Channel Impedance Value: 193.707
Lead Channel Impedance Value: 193.707
Lead Channel Impedance Value: 342 Ohm
Lead Channel Impedance Value: 361 Ohm
Lead Channel Impedance Value: 361 Ohm
Lead Channel Impedance Value: 418 Ohm
Lead Channel Impedance Value: 418 Ohm
Lead Channel Impedance Value: 418 Ohm
Lead Channel Impedance Value: 418 Ohm
Lead Channel Impedance Value: 513 Ohm
Lead Channel Impedance Value: 589 Ohm
Lead Channel Impedance Value: 608 Ohm
Lead Channel Impedance Value: 646 Ohm
Lead Channel Impedance Value: 646 Ohm
Lead Channel Impedance Value: 665 Ohm
Lead Channel Pacing Threshold Amplitude: 0.5 V
Lead Channel Pacing Threshold Amplitude: 0.75 V
Lead Channel Pacing Threshold Amplitude: 1.5 V
Lead Channel Pacing Threshold Pulse Width: 0.4 ms
Lead Channel Pacing Threshold Pulse Width: 0.4 ms
Lead Channel Pacing Threshold Pulse Width: 0.4 ms
Lead Channel Sensing Intrinsic Amplitude: 1 mV
Lead Channel Sensing Intrinsic Amplitude: 1 mV
Lead Channel Sensing Intrinsic Amplitude: 6.5 mV
Lead Channel Sensing Intrinsic Amplitude: 6.5 mV
Lead Channel Setting Pacing Amplitude: 1.5 V
Lead Channel Setting Pacing Amplitude: 2 V
Lead Channel Setting Pacing Amplitude: 2.5 V
Lead Channel Setting Pacing Pulse Width: 0.4 ms
Lead Channel Setting Pacing Pulse Width: 0.4 ms
Lead Channel Setting Sensing Sensitivity: 0.3 mV

## 2021-11-12 NOTE — Progress Notes (Signed)
Remote ICD transmission.   

## 2021-11-15 ENCOUNTER — Other Ambulatory Visit: Payer: Self-pay | Admitting: Internal Medicine

## 2021-11-15 NOTE — Telephone Encounter (Signed)
This is Dr. Newman Nickels pt

## 2022-01-02 DIAGNOSIS — J449 Chronic obstructive pulmonary disease, unspecified: Secondary | ICD-10-CM | POA: Diagnosis not present

## 2022-01-02 DIAGNOSIS — E785 Hyperlipidemia, unspecified: Secondary | ICD-10-CM | POA: Diagnosis not present

## 2022-01-02 DIAGNOSIS — I5022 Chronic systolic (congestive) heart failure: Secondary | ICD-10-CM | POA: Diagnosis not present

## 2022-01-02 DIAGNOSIS — E1159 Type 2 diabetes mellitus with other circulatory complications: Secondary | ICD-10-CM | POA: Diagnosis not present

## 2022-01-07 DIAGNOSIS — L570 Actinic keratosis: Secondary | ICD-10-CM | POA: Diagnosis not present

## 2022-01-07 DIAGNOSIS — L578 Other skin changes due to chronic exposure to nonionizing radiation: Secondary | ICD-10-CM | POA: Diagnosis not present

## 2022-01-07 DIAGNOSIS — L821 Other seborrheic keratosis: Secondary | ICD-10-CM | POA: Diagnosis not present

## 2022-01-31 ENCOUNTER — Ambulatory Visit (INDEPENDENT_AMBULATORY_CARE_PROVIDER_SITE_OTHER): Payer: Medicare HMO

## 2022-01-31 DIAGNOSIS — I5042 Chronic combined systolic (congestive) and diastolic (congestive) heart failure: Secondary | ICD-10-CM | POA: Diagnosis not present

## 2022-02-01 LAB — CUP PACEART REMOTE DEVICE CHECK
Battery Remaining Longevity: 25 mo
Battery Voltage: 2.92 V
Brady Statistic AP VP Percent: 25.71 %
Brady Statistic AP VS Percent: 0.02 %
Brady Statistic AS VP Percent: 74.25 %
Brady Statistic AS VS Percent: 0.02 %
Brady Statistic RA Percent Paced: 25.71 %
Brady Statistic RV Percent Paced: 99.86 %
Date Time Interrogation Session: 20230327022822
HighPow Impedance: 74 Ohm
Implantable Lead Implant Date: 20190813
Implantable Lead Implant Date: 20190813
Implantable Lead Implant Date: 20190813
Implantable Lead Location: 753858
Implantable Lead Location: 753859
Implantable Lead Location: 753860
Implantable Lead Model: 4598
Implantable Lead Model: 5076
Implantable Lead Model: 6935
Implantable Pulse Generator Implant Date: 20190813
Lead Channel Impedance Value: 180.5 Ohm
Lead Channel Impedance Value: 189.525
Lead Channel Impedance Value: 201.488
Lead Channel Impedance Value: 201.488
Lead Channel Impedance Value: 212.8 Ohm
Lead Channel Impedance Value: 361 Ohm
Lead Channel Impedance Value: 361 Ohm
Lead Channel Impedance Value: 399 Ohm
Lead Channel Impedance Value: 418 Ohm
Lead Channel Impedance Value: 418 Ohm
Lead Channel Impedance Value: 418 Ohm
Lead Channel Impedance Value: 456 Ohm
Lead Channel Impedance Value: 475 Ohm
Lead Channel Impedance Value: 608 Ohm
Lead Channel Impedance Value: 646 Ohm
Lead Channel Impedance Value: 665 Ohm
Lead Channel Impedance Value: 665 Ohm
Lead Channel Impedance Value: 703 Ohm
Lead Channel Pacing Threshold Amplitude: 0.5 V
Lead Channel Pacing Threshold Amplitude: 0.875 V
Lead Channel Pacing Threshold Amplitude: 1.5 V
Lead Channel Pacing Threshold Pulse Width: 0.4 ms
Lead Channel Pacing Threshold Pulse Width: 0.4 ms
Lead Channel Pacing Threshold Pulse Width: 0.4 ms
Lead Channel Sensing Intrinsic Amplitude: 1.375 mV
Lead Channel Sensing Intrinsic Amplitude: 1.375 mV
Lead Channel Sensing Intrinsic Amplitude: 8.125 mV
Lead Channel Sensing Intrinsic Amplitude: 8.125 mV
Lead Channel Setting Pacing Amplitude: 1.5 V
Lead Channel Setting Pacing Amplitude: 2 V
Lead Channel Setting Pacing Amplitude: 2.5 V
Lead Channel Setting Pacing Pulse Width: 0.4 ms
Lead Channel Setting Pacing Pulse Width: 0.4 ms
Lead Channel Setting Sensing Sensitivity: 0.3 mV

## 2022-02-10 NOTE — Progress Notes (Signed)
Remote ICD transmission.   

## 2022-04-18 ENCOUNTER — Other Ambulatory Visit (HOSPITAL_COMMUNITY): Payer: Self-pay | Admitting: Cardiology

## 2022-04-20 ENCOUNTER — Encounter: Payer: Self-pay | Admitting: Cardiology

## 2022-04-20 ENCOUNTER — Ambulatory Visit (INDEPENDENT_AMBULATORY_CARE_PROVIDER_SITE_OTHER): Payer: Medicare HMO | Admitting: Cardiology

## 2022-04-20 VITALS — BP 112/64 | HR 60 | Ht 70.0 in | Wt 167.6 lb

## 2022-04-20 DIAGNOSIS — Z720 Tobacco use: Secondary | ICD-10-CM | POA: Diagnosis not present

## 2022-04-20 DIAGNOSIS — I10 Essential (primary) hypertension: Secondary | ICD-10-CM

## 2022-04-20 DIAGNOSIS — Z953 Presence of xenogenic heart valve: Secondary | ICD-10-CM | POA: Diagnosis not present

## 2022-04-20 DIAGNOSIS — I2510 Atherosclerotic heart disease of native coronary artery without angina pectoris: Secondary | ICD-10-CM | POA: Diagnosis not present

## 2022-04-20 DIAGNOSIS — I5042 Chronic combined systolic (congestive) and diastolic (congestive) heart failure: Secondary | ICD-10-CM

## 2022-04-20 DIAGNOSIS — I447 Left bundle-branch block, unspecified: Secondary | ICD-10-CM

## 2022-04-20 DIAGNOSIS — E785 Hyperlipidemia, unspecified: Secondary | ICD-10-CM

## 2022-04-20 MED ORDER — LOSARTAN POTASSIUM 50 MG PO TABS: 50.0000 mg | ORAL_TABLET | Freq: Every day | ORAL | 3 refills | Status: DC

## 2022-04-20 NOTE — Progress Notes (Signed)
Cardiology Office Note:    Date:  04/20/2022   ID:  Kenneth Mills, DOB 10/13/1951, MRN [id number]  PCP:  Orpah Melter, MD  Cardiologist:  Donato Heinz, MD  Electrophysiologist:  None   Referring MD: Orpah Melter, MD   Chief Complaint  Patient presents with   Congestive Heart Failure    History of Present Illness:    Kenneth Mills is a 71 y.o. male with a hx of COPD, IBD status post partial colectomy, chronic combined systolic and diastolic heart failure, severe aortic stenosis status post AVR 10/2017.  He was admitted in October 2018 with acute respiratory failure with parainfluenza requiring intubation.  Echocardiogram at that time showed severe LV systolic dysfunction and critical aortic stenosis.  Cardiac catheterization showed nonobstructive CAD, severe AS.  Underwent surgical aortic valve replacement on 10/18/2017.  Underwent CRT D implant on 06/19/2018.  Echocardiogram 08/15/2019 showed EF 55 to 60%.  Echo 10/07/2021 showed EF 55 to 60%, moderate LVH, grade 1 diastolic dysfunction, normal RV function, normal functioning prosthetic aortic valve.  Since last clinic visit, he reports that he is doing well.  Denies any chest pain, dyspnea, lightheadedness, syncope, lower extremity edema.  Walks dog daily for about 0.5 mile, denies any exertional symptoms.  Does report occasional palpitations at night where he feels like heart is beating hard.  Smoking less than 1 pack/day.    Wt Readings from Last 3 Encounters:  04/20/22 167 lb 9.6 oz (76 kg)  10/11/21 172 lb 3.2 oz (78.1 kg)  09/01/21 171 lb (77.6 kg)     Past Medical History:  Diagnosis Date   AICD (automatic cardioverter/defibrillator) present 06/19/2018   Anxiety    Aortic stenosis, severe    a. suspect low flow, low gradient severe AS.    Arthritis    Chronic kidney disease    COPD (chronic obstructive pulmonary disease) (HCC)    Coronary artery disease    Family history of adverse reaction to  anesthesia    /son "passed out multiple times after anesthesia"   Frequency of urination    Heart murmur    "not since valve was replaced" (06/19/2018)   High cholesterol    Hypertension    IBD (inflammatory bowel disease)    a. s/p partial colectomy in 2009   Incidental pulmonary nodule, > 48m and < 847m11/11/2016   Several small nodules in left lung   LBBB (left bundle branch block)    [place]   a. 08/2014: EF 15% suspect to be endstage CM 2/2 severe AS   Pneumonia 08/2017   S/P aortic valve replacement with bioprosthetic valve 10/18/2017   [facility]   Squamous carcinoma    ight ear   Tobacco abuse     Past Surgical History:  Procedure Laterality Date   AORTIC VALVE REPLACEMENT N/A 10/18/2017   Procedure: AORTIC VALVE REPLACEMENT;  Surgeon: OwRexene AlbertsMD;  [place] [facility]: [facility];  Laterality: N/A;   BIV ICD INSERTION CRT-D N/A 06/19/2018   Procedure: BIV ICD INSERTION CRT-D;  Surgeon: TaEvans LanceMD;  [facility];  Service: Cardiovascular;  Laterality: N/A;   CARDIAC VALVE REPLACEMENT     COLECTOMY  2009   bowel resection, for blockage   INGUINAL HERNIA REPAIR Left 1965   INGUINAL HERNIA REPAIR Right 08/27/2018   Procedure: RIGHT INGUINAL HERNIA REPAIR WITH MESH;  Surgeon: ToMarlou Starks  Sena Hitch, MD;  Location: [place];  Service: General;  Laterality: Right;   INSERTION OF MESH Right 08/27/2018   Procedure: INSERTION OF MESH;  Surgeon: Jovita Kussmaul, MD;  Location: [place];  Service: General;  Laterality: Right;   IR FLUORO GUIDE CV MIDLINE PICC LEFT  09/04/2017   RIGHT/LEFT HEART CATH AND CORONARY ANGIOGRAPHY N/A 09/06/2017   Procedure: RIGHT/LEFT HEART CATH AND CORONARY ANGIOGRAPHY;  Surgeon: Jolaine Artist, MD;  Location: [place] CV LAB;  Service: Cardiovascular;  Laterality: N/A;   SQUAMOUS CELL CARCINOMA EXCISION Right    "ear"    STERNOTOMY  10/18/2017   Procedure: STERNOTOMY;  Surgeon: Rexene Alberts, MD;  Location: [place];  Service: Open Heart Surgery;;   TEE WITHOUT CARDIOVERSION N/A 10/18/2017   Procedure: TRANSESOPHAGEAL ECHOCARDIOGRAM (TEE);  Surgeon: Rexene Alberts, MD;  Location: [place];  Service: Open Heart Surgery;  Laterality: N/A;    Current Medications: Current Meds  Medication Sig   aspirin EC 81 MG tablet 1 tablet   atorvastatin (LIPITOR) 20 MG tablet TAKE 1 TABLET BY MOUTH ONCE DAILY AT 6 PM.   clonazePAM (KLONOPIN) 0.5 MG tablet Take 0.5 mg by mouth daily as needed for anxiety.    metoprolol succinate (TOPROL-XL) 25 MG 24 hr tablet TAKE 1 TABLET EVERY DAY   [DISCONTINUED] losartan (COZAAR) 50 MG tablet Take 1 tablet by mouth once daily     Allergies:   Patient has no known allergies.   Social History   Socioeconomic History   Marital status: Divorced    Spouse name: Not on file   Number of children: Not on file   Years of education: Not on file   Highest education level: Not on file  Occupational History   Not on file  Tobacco Use   Smoking status: Some Days    Packs/day: 0.50    Years: 53.00    Total pack years: 26.50    Types: Cigarettes    Start date: 1966   Smokeless tobacco: Former    Types: Nurse, children's Use: Former  Substance and Sexual Activity   Alcohol use: Yes    Comment: 06/19/2018 "couple beers/year"   Drug use: Not Currently    Types: Marijuana   Sexual activity: Not on file  Other Topics Concern   Not on file  Social History Narrative   Not on file   Social Determinants of Health   Financial Resource Strain: Not on file  Food Insecurity: Not on file  Transportation Needs: Not on file  Physical Activity: Not on file  Stress: Not on file  Social Connections: Not on file     Family History: The patient's family history includes Heart attack in his father; Hypertension in his father; Lung cancer in his brother.  ROS:   Please see the  history of present illness.     All other systems reviewed and are negative.  EKGs/Labs/Other Studies Reviewed:    The following studies were reviewed today:   EKG:  04/20/2022: AV paced, rate 60 10/11/21: atrial sensed, ventricular paced, rate 62   Recent Labs: 04/28/2021: ALT 13; BUN 10; Creatinine, Ser 1.07; Hemoglobin 16.4; Platelets 129; Potassium 4.4; Sodium 141  Recent Lipid Panel    Component Value Date/Time   CHOL 119 04/28/2021 0925   TRIG 116 04/28/2021 0925   HDL 43 04/28/2021 0925   CHOLHDL 2.8 04/28/2021 0925   CHOLHDL 2.8 09/07/2017 0335   VLDL 18 09/07/2017 0335  LDLCALC 55 04/28/2021 0925    Physical Exam:    VS:  BP 112/64 (BP Location: Left Arm, Patient Position: Sitting, Cuff Size: Normal)   Pulse 60   Ht 5' 10"  (1.778 m)   Wt 167 lb 9.6 oz (76 kg)   SpO2 95%   BMI 24.05 kg/m     Wt Readings from Last 3 Encounters:  04/20/22 167 lb 9.6 oz (76 kg)  10/11/21 172 lb 3.2 oz (78.1 kg)  09/01/21 171 lb (77.6 kg)     GEN:  Well nourished, well developed in no acute distress HEENT: Normal NECK: No JVD; No carotid bruits LYMPHATICS: No lymphadenopathy CARDIAC: RRR, 2/6 systolic murmur RESPIRATORY:  Clear to auscultation without rales, wheezing or rhonchi  ABDOMEN: Soft, non-tender, non-distended MUSCULOSKELETAL:  No edema; No deformity  SKIN: Warm and dry NEUROLOGIC:  Alert and oriented x 3 PSYCHIATRIC:  Normal affect   ASSESSMENT:    1. Chronic combined systolic and diastolic heart failure ([place])   2. S/P aortic valve replacement with bioprosthetic valve   3. Tobacco abuse   4. CAD in native artery   5. LBBB (left bundle branch block)   6. Essential hypertension   7. Hyperlipidemia, unspecified hyperlipidemia type     PLAN:    Chronic combined systolic and diastolic CHF: Echo 82/99/37 LVEF 15%, RV normal, critical low gradient AS; TEE 12/18: EF 20-25%, normal RV, mild MR, trace TR.  Echo 02/2018 EF 20-25%. Now s/p CRT-D 06/19/18.  Echo  08/15/19 EF 55-60% complete recovery after CRT-D.  Echo 10/07/2021 showed EF 55 to 60%, moderate LVH, grade 1 diastolic dysfunction, normal RV function, normal functioning prosthetic aortic valve. - Continue lasix 23m prn  - Continue losartan 50 mg daily.  EDelene Lollwas discontinued due to cost - Continue Toprol XL 25 mg  - Off spiro by his choice   Aortic stenosis: s/p bioprosthetic AVR 10/18/2017.  No symptoms to suggest prosthetic valve dysfunction.  Normal functioning valve on echo 10/07/2021, trivial PVL and mean gradient 8 mmHg.  Tobacco abuse: Continues to smoke, currently at about 1ppd.  Counseled on the risks of tobacco use and cessation strongly encouraged.  Asked care guide to work with patient on smoking cessation, but patient currently wants to try to quit on his own.   CAD: Mild non-obstructive CAD with 50% proximal RCA with spasm on cath 09/06/17.  No anginal symptoms. - Continue atorvastatin 20 mg daily + ASA 81 mg daily   LBBB: S/p CRT-D 06/19/2018.  EF improved. - Follows with EP  Hypertension: Continue losartan and Toprol-XL.  Appears controlled.  Hyperlipidemia: continue atorvastatin 20 mg daily, LDL 55 on 04/28/21.  T2DM: A1c 6.6% on 05/17/21.   RTC in 6 months   Medication Adjustments/Labs and Tests Ordered: Current medicines are reviewed at length with the patient today.  Concerns regarding medicines are outlined above.  Orders Placed This Encounter  Procedures   EKG 12-Lead     Meds ordered this encounter  Medications   losartan (COZAAR) 50 MG tablet    Sig: Take 1 tablet (50 mg total) by mouth daily.    Dispense:  90 tablet    Refill:  3     Patient Instructions  Medication Instructions:  Your physician recommends that you continue on your current medications as directed. Please refer to the Current Medication list given to you today.  *If you need a refill on your cardiac medications before your next appointment, please call your  pharmacy*  Follow-Up: At  [facility], you and your health needs are our priority.  As part of our continuing mission to provide you with exceptional heart care, we have created designated Provider Care Teams.  These Care Teams include your primary Cardiologist (physician) and Advanced Practice Providers (APPs -  Physician Assistants and Nurse Practitioners) who all work together to provide you with the care you need, when you need it.  We recommend signing up for the patient portal called "MyChart".  Sign up information is provided on this After Visit Summary.  MyChart is used to connect with patients for Virtual Visits (Telemedicine).  Patients are able to view lab/test results, encounter notes, upcoming appointments, etc.  Non-urgent messages can be sent to your provider as well.   To learn more about what you can do with MyChart, go to [email or web address].    Your next appointment:   6 month(s)  The format for your next appointment:   In Person  Provider:   Donato Heinz, MD {   Important Information About Sugar         Signed, Donato Heinz, MD  04/20/2022 11:35 AM    [place]

## 2022-04-20 NOTE — Patient Instructions (Signed)
Medication Instructions:  Your physician recommends that you continue on your current medications as directed. Please refer to the Current Medication list given to you today.  *If you need a refill on your cardiac medications before your next appointment, please call your pharmacy*  Follow-Up: At [facility], you and your health needs are our priority.  As part of our continuing mission to provide you with exceptional heart care, we have created designated Provider Care Teams.  These Care Teams include your primary Cardiologist (physician) and Advanced Practice Providers (APPs -  Physician Assistants and Nurse Practitioners) who all work together to provide you with the care you need, when you need it.  We recommend signing up for the patient portal called "MyChart".  Sign up information is provided on this After Visit Summary.  MyChart is used to connect with patients for Virtual Visits (Telemedicine).  Patients are able to view lab/test results, encounter notes, upcoming appointments, etc.  Non-urgent messages can be sent to your provider as well.   To learn more about what you can do with MyChart, go to [email or web address].    Your next appointment:   6 month(s)  The format for your next appointment:   In Person  Provider:   Christopher L Schumann, MD {  Important Information About Sugar       

## 2022-05-02 ENCOUNTER — Ambulatory Visit (INDEPENDENT_AMBULATORY_CARE_PROVIDER_SITE_OTHER): Payer: Medicare HMO

## 2022-05-02 DIAGNOSIS — I5042 Chronic combined systolic (congestive) and diastolic (congestive) heart failure: Secondary | ICD-10-CM | POA: Diagnosis not present

## 2022-05-02 DIAGNOSIS — I2510 Atherosclerotic heart disease of native coronary artery without angina pectoris: Secondary | ICD-10-CM | POA: Diagnosis not present

## 2022-05-02 DIAGNOSIS — I428 Other cardiomyopathies: Secondary | ICD-10-CM | POA: Diagnosis not present

## 2022-05-02 DIAGNOSIS — E785 Hyperlipidemia, unspecified: Secondary | ICD-10-CM | POA: Diagnosis not present

## 2022-05-02 DIAGNOSIS — E1159 Type 2 diabetes mellitus with other circulatory complications: Secondary | ICD-10-CM | POA: Diagnosis not present

## 2022-05-03 LAB — CUP PACEART REMOTE DEVICE CHECK
Battery Remaining Longevity: 23 mo
Battery Voltage: 2.91 V
Brady Statistic AP VP Percent: 22.92 %
Brady Statistic AP VS Percent: 0.02 %
Brady Statistic AS VP Percent: 77.03 %
Brady Statistic AS VS Percent: 0.03 %
Brady Statistic RA Percent Paced: 22.93 %
Brady Statistic RV Percent Paced: 99.88 %
Date Time Interrogation Session: 20230626001604
HighPow Impedance: 79 Ohm
Implantable Lead Implant Date: 20190813
Implantable Lead Implant Date: 20190813
Implantable Lead Implant Date: 20190813
Implantable Lead Location: 753858
Implantable Lead Location: 753859
Implantable Lead Location: 753860
Implantable Lead Model: 4598
Implantable Lead Model: 5076
Implantable Lead Model: 6935
Implantable Pulse Generator Implant Date: 20190813
Lead Channel Impedance Value: 204.14 Ohm
Lead Channel Impedance Value: 209 Ohm
Lead Channel Impedance Value: 216.848
Lead Channel Impedance Value: 222.34 Ohm
Lead Channel Impedance Value: 222.34 Ohm
Lead Channel Impedance Value: 399 Ohm
Lead Channel Impedance Value: 399 Ohm
Lead Channel Impedance Value: 418 Ohm
Lead Channel Impedance Value: 418 Ohm
Lead Channel Impedance Value: 418 Ohm
Lead Channel Impedance Value: 475 Ohm
Lead Channel Impedance Value: 475 Ohm
Lead Channel Impedance Value: 475 Ohm
Lead Channel Impedance Value: 665 Ohm
Lead Channel Impedance Value: 703 Ohm
Lead Channel Impedance Value: 779 Ohm
Lead Channel Impedance Value: 779 Ohm
Lead Channel Impedance Value: 779 Ohm
Lead Channel Pacing Threshold Amplitude: 0.5 V
Lead Channel Pacing Threshold Amplitude: 1 V
Lead Channel Pacing Threshold Amplitude: 1.625 V
Lead Channel Pacing Threshold Pulse Width: 0.4 ms
Lead Channel Pacing Threshold Pulse Width: 0.4 ms
Lead Channel Pacing Threshold Pulse Width: 0.4 ms
Lead Channel Sensing Intrinsic Amplitude: 1.625 mV
Lead Channel Sensing Intrinsic Amplitude: 1.625 mV
Lead Channel Sensing Intrinsic Amplitude: 6.125 mV
Lead Channel Sensing Intrinsic Amplitude: 6.125 mV
Lead Channel Setting Pacing Amplitude: 1.5 V
Lead Channel Setting Pacing Amplitude: 2.25 V
Lead Channel Setting Pacing Amplitude: 2.5 V
Lead Channel Setting Pacing Pulse Width: 0.4 ms
Lead Channel Setting Pacing Pulse Width: 0.4 ms
Lead Channel Setting Sensing Sensitivity: 0.3 mV

## 2022-05-20 DIAGNOSIS — Z0000 Encounter for general adult medical examination without abnormal findings: Secondary | ICD-10-CM | POA: Diagnosis not present

## 2022-05-20 DIAGNOSIS — E1159 Type 2 diabetes mellitus with other circulatory complications: Secondary | ICD-10-CM | POA: Diagnosis not present

## 2022-05-20 DIAGNOSIS — F419 Anxiety disorder, unspecified: Secondary | ICD-10-CM | POA: Diagnosis not present

## 2022-05-20 DIAGNOSIS — Z1211 Encounter for screening for malignant neoplasm of colon: Secondary | ICD-10-CM | POA: Diagnosis not present

## 2022-05-20 DIAGNOSIS — I5042 Chronic combined systolic (congestive) and diastolic (congestive) heart failure: Secondary | ICD-10-CM | POA: Diagnosis not present

## 2022-05-20 DIAGNOSIS — Z125 Encounter for screening for malignant neoplasm of prostate: Secondary | ICD-10-CM | POA: Diagnosis not present

## 2022-05-20 DIAGNOSIS — J449 Chronic obstructive pulmonary disease, unspecified: Secondary | ICD-10-CM | POA: Diagnosis not present

## 2022-05-20 DIAGNOSIS — E785 Hyperlipidemia, unspecified: Secondary | ICD-10-CM | POA: Diagnosis not present

## 2022-05-25 DIAGNOSIS — Z1211 Encounter for screening for malignant neoplasm of colon: Secondary | ICD-10-CM | POA: Diagnosis not present

## 2022-05-27 NOTE — Progress Notes (Signed)
Remote ICD transmission.   

## 2022-07-14 DIAGNOSIS — H5203 Hypermetropia, bilateral: Secondary | ICD-10-CM | POA: Diagnosis not present

## 2022-07-14 DIAGNOSIS — H25013 Cortical age-related cataract, bilateral: Secondary | ICD-10-CM | POA: Diagnosis not present

## 2022-07-14 DIAGNOSIS — H2513 Age-related nuclear cataract, bilateral: Secondary | ICD-10-CM | POA: Diagnosis not present

## 2022-07-27 DIAGNOSIS — E785 Hyperlipidemia, unspecified: Secondary | ICD-10-CM | POA: Diagnosis not present

## 2022-07-27 DIAGNOSIS — I5042 Chronic combined systolic (congestive) and diastolic (congestive) heart failure: Secondary | ICD-10-CM | POA: Diagnosis not present

## 2022-07-27 DIAGNOSIS — I2510 Atherosclerotic heart disease of native coronary artery without angina pectoris: Secondary | ICD-10-CM | POA: Diagnosis not present

## 2022-07-27 DIAGNOSIS — E1159 Type 2 diabetes mellitus with other circulatory complications: Secondary | ICD-10-CM | POA: Diagnosis not present

## 2022-08-01 ENCOUNTER — Ambulatory Visit (INDEPENDENT_AMBULATORY_CARE_PROVIDER_SITE_OTHER): Payer: Medicare HMO

## 2022-08-01 DIAGNOSIS — I428 Other cardiomyopathies: Secondary | ICD-10-CM

## 2022-08-01 DIAGNOSIS — I5042 Chronic combined systolic (congestive) and diastolic (congestive) heart failure: Secondary | ICD-10-CM

## 2022-08-02 LAB — CUP PACEART REMOTE DEVICE CHECK
Battery Remaining Longevity: 22 mo
Battery Voltage: 2.91 V
Brady Statistic AP VP Percent: 33.19 %
Brady Statistic AP VS Percent: 0.03 %
Brady Statistic AS VP Percent: 66.73 %
Brady Statistic AS VS Percent: 0.05 %
Brady Statistic RA Percent Paced: 33.18 %
Brady Statistic RV Percent Paced: 99.77 %
Date Time Interrogation Session: 20230925012303
HighPow Impedance: 80 Ohm
Implantable Lead Implant Date: 20190813
Implantable Lead Implant Date: 20190813
Implantable Lead Implant Date: 20190813
Implantable Lead Location: 753858
Implantable Lead Location: 753859
Implantable Lead Location: 753860
Implantable Lead Model: 4598
Implantable Lead Model: 5076
Implantable Lead Model: 6935
Implantable Pulse Generator Implant Date: 20190813
Lead Channel Impedance Value: 204.14 Ohm
Lead Channel Impedance Value: 204.14 Ohm
Lead Channel Impedance Value: 224.438
Lead Channel Impedance Value: 230.327
Lead Channel Impedance Value: 230.327
Lead Channel Impedance Value: 399 Ohm
Lead Channel Impedance Value: 399 Ohm
Lead Channel Impedance Value: 418 Ohm
Lead Channel Impedance Value: 418 Ohm
Lead Channel Impedance Value: 456 Ohm
Lead Channel Impedance Value: 456 Ohm
Lead Channel Impedance Value: 475 Ohm
Lead Channel Impedance Value: 513 Ohm
Lead Channel Impedance Value: 665 Ohm
Lead Channel Impedance Value: 703 Ohm
Lead Channel Impedance Value: 779 Ohm
Lead Channel Impedance Value: 817 Ohm
Lead Channel Impedance Value: 817 Ohm
Lead Channel Pacing Threshold Amplitude: 0.5 V
Lead Channel Pacing Threshold Amplitude: 0.75 V
Lead Channel Pacing Threshold Amplitude: 1.75 V
Lead Channel Pacing Threshold Pulse Width: 0.4 ms
Lead Channel Pacing Threshold Pulse Width: 0.4 ms
Lead Channel Pacing Threshold Pulse Width: 0.4 ms
Lead Channel Sensing Intrinsic Amplitude: 10.75 mV
Lead Channel Sensing Intrinsic Amplitude: 10.75 mV
Lead Channel Sensing Intrinsic Amplitude: 2 mV
Lead Channel Sensing Intrinsic Amplitude: 2 mV
Lead Channel Setting Pacing Amplitude: 1.5 V
Lead Channel Setting Pacing Amplitude: 2.25 V
Lead Channel Setting Pacing Amplitude: 2.5 V
Lead Channel Setting Pacing Pulse Width: 0.4 ms
Lead Channel Setting Pacing Pulse Width: 0.4 ms
Lead Channel Setting Sensing Sensitivity: 0.3 mV

## 2022-08-16 DIAGNOSIS — I5042 Chronic combined systolic (congestive) and diastolic (congestive) heart failure: Secondary | ICD-10-CM | POA: Diagnosis not present

## 2022-08-16 DIAGNOSIS — I2510 Atherosclerotic heart disease of native coronary artery without angina pectoris: Secondary | ICD-10-CM | POA: Diagnosis not present

## 2022-08-16 DIAGNOSIS — J449 Chronic obstructive pulmonary disease, unspecified: Secondary | ICD-10-CM | POA: Diagnosis not present

## 2022-08-16 DIAGNOSIS — E1159 Type 2 diabetes mellitus with other circulatory complications: Secondary | ICD-10-CM | POA: Diagnosis not present

## 2022-08-16 DIAGNOSIS — E785 Hyperlipidemia, unspecified: Secondary | ICD-10-CM | POA: Diagnosis not present

## 2022-08-17 NOTE — Progress Notes (Signed)
Remote ICD transmission.   

## 2022-09-16 ENCOUNTER — Other Ambulatory Visit: Payer: Self-pay | Admitting: Internal Medicine

## 2022-09-20 DIAGNOSIS — H2512 Age-related nuclear cataract, left eye: Secondary | ICD-10-CM | POA: Diagnosis not present

## 2022-09-20 DIAGNOSIS — H2513 Age-related nuclear cataract, bilateral: Secondary | ICD-10-CM | POA: Diagnosis not present

## 2022-09-20 DIAGNOSIS — H35372 Puckering of macula, left eye: Secondary | ICD-10-CM | POA: Diagnosis not present

## 2022-09-20 DIAGNOSIS — H25043 Posterior subcapsular polar age-related cataract, bilateral: Secondary | ICD-10-CM | POA: Diagnosis not present

## 2022-09-20 DIAGNOSIS — H25013 Cortical age-related cataract, bilateral: Secondary | ICD-10-CM | POA: Diagnosis not present

## 2022-09-20 DIAGNOSIS — H18413 Arcus senilis, bilateral: Secondary | ICD-10-CM | POA: Diagnosis not present

## 2022-10-09 NOTE — Progress Notes (Unsigned)
Cardiology Office Note:    Date:  10/10/2022   ID:  Kenneth Mills, DOB 1951/07/02, MRN [id number]  PCP:  Orpah Melter, MD  Cardiologist:  Donato Heinz, MD  Electrophysiologist:  None   Referring MD: Orpah Melter, MD   No chief complaint on file.   History of Present Illness:    Kenneth Mills is a 71 y.o. male with a hx of COPD, IBD status post partial colectomy, chronic combined systolic and diastolic heart failure, severe aortic stenosis status post AVR 10/2017.  He was admitted in October 2018 with acute respiratory failure with parainfluenza requiring intubation.  Echocardiogram at that time showed severe LV systolic dysfunction and critical aortic stenosis.  Cardiac catheterization showed nonobstructive CAD, severe AS.  Underwent surgical aortic valve replacement on 10/18/2017.  Underwent CRT D implant on 06/19/2018.  Echocardiogram 08/15/2019 showed EF 55 to 60%.  Echo 10/07/2021 showed EF 55 to 60%, moderate LVH, grade 1 diastolic dysfunction, normal RV function, normal functioning prosthetic aortic valve.  Since last clinic visit, he reports that he has been doing well.  Denies any chest pain, dyspnea, lower extremity edema, or palpitations.  Reports some lightheadedness with standing, denies any syncope.  Continues to smoke 1 pack/day    Wt Readings from Last 3 Encounters:  10/10/22 165 lb 6.4 oz (75 kg)  04/20/22 167 lb 9.6 oz (76 kg)  10/11/21 172 lb 3.2 oz (78.1 kg)     Past Medical History:  Diagnosis Date   AICD (automatic cardioverter/defibrillator) present 06/19/2018   Anxiety    Aortic stenosis, severe    a. suspect low flow, low gradient severe AS.    Arthritis    Chronic kidney disease    COPD (chronic obstructive pulmonary disease) (HCC)    Coronary artery disease    Family history of adverse reaction to anesthesia    /son "passed out multiple times after anesthesia"   Frequency of urination    Heart murmur    "not since valve was  replaced" (06/19/2018)   High cholesterol    Hypertension    IBD (inflammatory bowel disease)    a. s/p partial colectomy in 2009   Incidental pulmonary nodule, > 48m and < 878m11/11/2016   Several small nodules in left lung   LBBB (left bundle branch block)    [place]mic [place]   [place]. 08/2014: EF 15% suspect to be endstage CM 2/2 severe AS   Pneumonia 08/2017   S/P aortic valve replacement with bioprosthetic valve 10/18/2017   [facility] pericard[facility] valve   Squamous carcinoma    ight ear   Tobacco abuse     Past Surgical History:  Procedure Laterality Date   AORTIC VALVE REPLACEMENT N/A 10/18/2017   Procedure: AORTIC VALVE REPLACEMENT;  Surgeon: OwRexene AlbertsMD;  [place]: [place];  Laterality: N/A;   BIV ICD INSERTION CRT-D N/A 06/19/2018   Procedure: BIV ICD INSERTION CRT-D;  Surgeon: TaEvans LanceMD;  [place] [facility];  Service: Cardiovascular;  Laterality: N/A;   CARDIAC VALVE REPLACEMENT     COLECTOMY  2009   bowel resection, for blockage   INGUINAL HERNIA REPAIR Left 1965   INGUINAL HERNIA REPAIR Right 08/27/2018   Procedure: RIGHT INGUINAL HERNIA REPAIR WITH MESH;  Surgeon: ToJovita KussmaulMD;  [place] [facility] Service: General;  Laterality: Right;   INSERTION OF MESH Right 08/27/2018  Procedure: INSERTION OF MESH;  Surgeon: Jovita Kussmaul, MD;  Location: [place];  Service: General;  Laterality: Right;   IR FLUORO GUIDE CV MIDLINE PICC LEFT  09/04/2017   RIGHT/LEFT HEART CATH AND CORONARY ANGIOGRAPHY N/A 09/06/2017   Procedure: RIGHT/LEFT HEART CATH AND CORONARY ANGIOGRAPHY;  Surgeon: Jolaine Artist, MD;  Location: [place] [facility];  Service: Cardiovascular;  Laterality: N/A;   SQUAMOUS CELL CARCINOMA EXCISION Right    "ear"   STERNOTOMY  10/18/2017   Procedure: STERNOTOMY;  Surgeon: Rexene Alberts, MD;  Location: [place];  Service: Open Heart Surgery;;   TEE  WITHOUT CARDIOVERSION N/A 10/18/2017   Procedure: TRANSESOPHAGEAL ECHOCARDIOGRAM (TEE);  Surgeon: Rexene Alberts, MD;  Location: [place];  Service: Open Heart Surgery;  Laterality: N/A;    Current Medications: Current Meds  Medication Sig   aspirin EC 81 MG tablet 1 tablet   atorvastatin (LIPITOR) 20 MG tablet TAKE 1 TABLET ONE TIME DAILY AT 6 PM   clonazePAM (KLONOPIN) 0.5 MG tablet Take 0.5 mg by mouth daily as needed for anxiety.    losartan (COZAAR) 50 MG tablet Take 1 tablet (50 mg total) by mouth daily.   MELATONIN GUMMIES PO Take by mouth. 2 gummies nightly   metoprolol succinate (TOPROL-XL) 25 MG 24 hr tablet TAKE 1 TABLET EVERY DAY   Multiple Vitamins-Minerals (MULTIVITAMIN ADULT) CHEW Chew 1 each by mouth daily.     Allergies:   Patient has no known allergies.   Social History   Socioeconomic History   Marital status: Divorced    Spouse name: Not on file   Number of children: Not on file   Years of education: Not on file   Highest education level: Not on file  Occupational History   Not on file  Tobacco Use   Smoking status: Some Days    Packs/day: 0.50    Years: 53.00    Total pack years: 26.50    Types: Cigarettes    Start date: 1966   Smokeless tobacco: Former    Types: Nurse, children's Use: Former  Substance and Sexual Activity   Alcohol use: Yes    Comment: 06/19/2018 "couple beers/year"   Drug use: Not Currently    Types: Marijuana   Sexual activity: Not on file  Other Topics Concern   Not on file  Social History Narrative   Not on file   Social Determinants of Health   Financial Resource Strain: Not on file  Food Insecurity: Not on file  Transportation Needs: Not on file  Physical Activity: Not on file  Stress: Not on file  Social Connections: Not on file     Family History: The patient's family history includes Heart attack in his father; Hypertension in his father; Lung cancer in his brother.  ROS:   Please see the history of  present illness.     All other systems reviewed and are negative.  EKGs/Labs/Other Studies Reviewed:    The following studies were reviewed today:   EKG:  04/20/2022: AV paced, rate 60 10/11/21: atrial sensed, ventricular paced, rate 62   Recent Labs: No results found for requested labs within last 365 days.  Recent Lipid Panel    Component Value Date/Time   CHOL 119 04/28/2021 0925   TRIG 116 04/28/2021 0925   HDL 43 04/28/2021 0925   CHOLHDL 2.8 04/28/2021 0925   CHOLHDL 2.8 09/07/2017 0335   VLDL 18 09/07/2017 0335   LDLCALC 55  04/28/2021 0925    Physical Exam:    VS:  BP 138/68 (BP Location: Left Arm, Patient Position: Sitting, Cuff Size: Normal)   Pulse 68   Ht 5' 10"  (1.778 m)   Wt 165 lb 6.4 oz (75 kg)   SpO2 95%   BMI 23.73 kg/m     Wt Readings from Last 3 Encounters:  10/10/22 165 lb 6.4 oz (75 kg)  04/20/22 167 lb 9.6 oz (76 kg)  10/11/21 172 lb 3.2 oz (78.1 kg)     GEN:  Well nourished, well developed in no acute distress HEENT: Normal NECK: No JVD; No carotid bruits LYMPHATICS: No lymphadenopathy CARDIAC: RRR, 2/6 systolic murmur RESPIRATORY:  Clear to auscultation without rales, wheezing or rhonchi  ABDOMEN: Soft, non-tender, non-distended MUSCULOSKELETAL:  No edema; No deformity  SKIN: Warm and dry NEUROLOGIC:  Alert and oriented x 3 PSYCHIATRIC:  Normal affect   ASSESSMENT:    1. Chronic combined systolic and diastolic heart failure due to valvular disease ([place])   2. S/P aortic valve replacement with bioprosthetic valve   3. Tobacco abuse   4. Essential hypertension   5. Hyperlipidemia, unspecified hyperlipidemia type      PLAN:    Chronic combined systolic and diastolic CHF: Echo 03/17/01 LVEF 15%, RV normal, critical low gradient AS; TEE 12/18: EF 20-25%, normal RV, mild MR, trace TR.  Echo 02/2018 EF 20-25%. Now s/p CRT-D 06/19/18.  Echo 08/15/19 EF 55-60% complete recovery after CRT-D.  Echo 10/07/2021 showed EF 55 to 60%, moderate LVH,  grade 1 diastolic dysfunction, normal RV function, normal functioning prosthetic aortic valve. - Continue lasix 74m prn  - Continue losartan 50 mg daily.  EDelene Lollwas discontinued due to cost - Continue Toprol XL 25 mg  - Off spiro by his choice   Aortic stenosis: s/p bioprosthetic AVR 10/18/2017.  No symptoms to suggest prosthetic valve dysfunction.  Normal functioning valve on echo 10/07/2021, trivial PVL and mean gradient 8 mmHg.  Tobacco abuse: Continues to smoke, currently at about 1ppd.  Counseled on the risks of tobacco use and cessation strongly encouraged.  Asked care guide to work with patient on smoking cessation, but patient currently wants to try to quit on his own.   CAD: Mild non-obstructive CAD with 50% proximal RCA with spasm on cath 09/06/17.  No anginal symptoms. - Continue atorvastatin 20 mg daily + ASA 81 mg daily   LBBB: S/p CRT-D 06/19/2018.  EF improved. - Follows with EP  Hypertension: Continue losartan and Toprol-XL.  Appears controlled.  Hyperlipidemia: continue atorvastatin 20 mg daily, LDL 54 on 05/20/22  T2DM: A1c 6.7% on 05/20/2022   RTC in 6 months   Medication Adjustments/Labs and Tests Ordered: Current medicines are reviewed at length with the patient today.  Concerns regarding medicines are outlined above.  No orders of the defined types were placed in this encounter.    No orders of the defined types were placed in this encounter.    Patient Instructions   Medication Instructions:  Your physician recommends that you continue on your current medications as directed. Please refer to the Current Medication list given to you today.  *If you need a refill on your cardiac medications before your next appointment, please call your pharmacy*  Follow-Up: At [facility] you and your health needs are our priority.  As part of our continuing mission to provide you with exceptional heart care, we have created designated Provider Care Teams.   These Care Teams include your  primary Cardiologist (physician) and Advanced Practice Providers (APPs -  Physician Assistants and Nurse Practitioners) who all work together to provide you with the care you need, when you need it.  We recommend signing up for the patient portal called "MyChart".  Sign up information is provided on this After Visit Summary.  MyChart is used to connect with patients for Virtual Visits (Telemedicine).  Patients are able to view lab/test results, encounter notes, upcoming appointments, etc.  Non-urgent messages can be sent to your provider as well.   To learn more about what you can do with MyChart, go to [email or web address].    Your next appointment:   6 month(s)  The format for your next appointment:   In Person  Provider:   Donato Heinz, MD          Signed, Donato Heinz, MD  10/10/2022 5:31 PM    [place]

## 2022-10-10 ENCOUNTER — Encounter: Payer: Self-pay | Admitting: Cardiology

## 2022-10-10 ENCOUNTER — Ambulatory Visit: Payer: Medicare HMO | Attending: Cardiology | Admitting: Cardiology

## 2022-10-10 VITALS — BP 138/68 | HR 68 | Ht 70.0 in | Wt 165.4 lb

## 2022-10-10 DIAGNOSIS — I38 Endocarditis, valve unspecified: Secondary | ICD-10-CM

## 2022-10-10 DIAGNOSIS — E785 Hyperlipidemia, unspecified: Secondary | ICD-10-CM

## 2022-10-10 DIAGNOSIS — I10 Essential (primary) hypertension: Secondary | ICD-10-CM

## 2022-10-10 DIAGNOSIS — I5042 Chronic combined systolic (congestive) and diastolic (congestive) heart failure: Secondary | ICD-10-CM | POA: Diagnosis not present

## 2022-10-10 DIAGNOSIS — Z953 Presence of xenogenic heart valve: Secondary | ICD-10-CM

## 2022-10-10 DIAGNOSIS — Z720 Tobacco use: Secondary | ICD-10-CM | POA: Diagnosis not present

## 2022-10-10 NOTE — Patient Instructions (Addendum)
Medication Instructions:  Your physician recommends that you continue on your current medications as directed. Please refer to the Current Medication list given to you today.  *If you need a refill on your cardiac medications before your next appointment, please call your pharmacy*  Follow-Up: At [facility], you and your health needs are our priority.  As part of our continuing mission to provide you with exceptional heart care, we have created designated Provider Care Teams.  These Care Teams include your primary Cardiologist (physician) and Advanced Practice Providers (APPs -  Physician Assistants and Nurse Practitioners) who all work together to provide you with the care you need, when you need it.  We recommend signing up for the patient portal called "MyChart".  Sign up information is provided on this After Visit Summary.  MyChart is used to connect with patients for Virtual Visits (Telemedicine).  Patients are able to view lab/test results, encounter notes, upcoming appointments, etc.  Non-urgent messages can be sent to your provider as well.   To learn more about what you can do with MyChart, go to [email or web address].    Your next appointment:   6 month(s)  The format for your next appointment:   In Person  Provider:   Christopher L Schumann, MD         

## 2022-11-01 ENCOUNTER — Ambulatory Visit (INDEPENDENT_AMBULATORY_CARE_PROVIDER_SITE_OTHER): Payer: Medicare HMO

## 2022-11-01 DIAGNOSIS — I428 Other cardiomyopathies: Secondary | ICD-10-CM | POA: Diagnosis not present

## 2022-11-02 LAB — CUP PACEART REMOTE DEVICE CHECK
Battery Remaining Longevity: 23 mo
Battery Voltage: 2.91 V
Brady Statistic AP VP Percent: 34.94 %
Brady Statistic AP VS Percent: 0.03 %
Brady Statistic AS VP Percent: 65.01 %
Brady Statistic AS VS Percent: 0.02 %
Brady Statistic RA Percent Paced: 34.92 %
Brady Statistic RV Percent Paced: 99.8 %
Date Time Interrogation Session: 20231226002203
HighPow Impedance: 75 Ohm
Implantable Lead Connection Status: 753985
Implantable Lead Connection Status: 753985
Implantable Lead Connection Status: 753985
Implantable Lead Implant Date: 20190813
Implantable Lead Implant Date: 20190813
Implantable Lead Implant Date: 20190813
Implantable Lead Location: 753858
Implantable Lead Location: 753859
Implantable Lead Location: 753860
Implantable Lead Model: 4598
Implantable Lead Model: 5076
Implantable Lead Model: 6935
Implantable Pulse Generator Implant Date: 20190813
Lead Channel Impedance Value: 180.5 Ohm
Lead Channel Impedance Value: 189.525
Lead Channel Impedance Value: 201.488
Lead Channel Impedance Value: 201.488
Lead Channel Impedance Value: 212.8 Ohm
Lead Channel Impedance Value: 361 Ohm
Lead Channel Impedance Value: 361 Ohm
Lead Channel Impedance Value: 361 Ohm
Lead Channel Impedance Value: 399 Ohm
Lead Channel Impedance Value: 399 Ohm
Lead Channel Impedance Value: 418 Ohm
Lead Channel Impedance Value: 418 Ohm
Lead Channel Impedance Value: 456 Ohm
Lead Channel Impedance Value: 608 Ohm
Lead Channel Impedance Value: 646 Ohm
Lead Channel Impedance Value: 722 Ohm
Lead Channel Impedance Value: 722 Ohm
Lead Channel Impedance Value: 722 Ohm
Lead Channel Pacing Threshold Amplitude: 0.5 V
Lead Channel Pacing Threshold Amplitude: 0.625 V
Lead Channel Pacing Threshold Amplitude: 1.5 V
Lead Channel Pacing Threshold Pulse Width: 0.4 ms
Lead Channel Pacing Threshold Pulse Width: 0.4 ms
Lead Channel Pacing Threshold Pulse Width: 0.4 ms
Lead Channel Sensing Intrinsic Amplitude: 1 mV
Lead Channel Sensing Intrinsic Amplitude: 1 mV
Lead Channel Sensing Intrinsic Amplitude: 11 mV
Lead Channel Sensing Intrinsic Amplitude: 11 mV
Lead Channel Setting Pacing Amplitude: 1.5 V
Lead Channel Setting Pacing Amplitude: 2.25 V
Lead Channel Setting Pacing Amplitude: 2.5 V
Lead Channel Setting Pacing Pulse Width: 0.4 ms
Lead Channel Setting Pacing Pulse Width: 0.4 ms
Lead Channel Setting Sensing Sensitivity: 0.3 mV
Zone Setting Status: 755011
Zone Setting Status: 755011

## 2022-11-10 NOTE — Progress Notes (Signed)
Cardiology Office Note Date:  11/11/2022  Patient ID:  Kenneth, Mills 08/08/1951, MRN [id number] PCP:  Orpah Melter, MD  Cardiologist:  Donato Heinz, MD Heart Failure Cardiologist: Glori Bickers, MD Electrophysiologist: Cristopher Peru, MD   Chief Complaint: 88yrroutine f/up  History of Present Illness: Kenneth SPACEis a 72y.o. male with PMH notable for HFrecEF s/p CRT-D, LBBB, HTN, severe aortic stenosis s/p sAVR, tobacco abuse; seen today for GCristopher Peru MD for routine electrophysiology followup.   Briefly, he was admitted 08/2017 with ARF, parainfluena requiring intubation. Echo at that time showed markedly depressed LV dysfunction and critical low-grade AS (new findings).  Cath showed non-obstructive disease, with marginal cardiac output, and severe AS.    TAVR team consulted and work up began. Pt had cardiac CTs and Carotid dopplers inpatient. PFTs and outpatient surgery follow up arranged.   Pt s/p sAVR 10/18/2017. Required milrinone transiently that admission. GDMT titrated as tolerated.   Referred to EP 02/22/2018 with Dr TLovena Le Underwent CRT-D implant 06/19/2018. Echo 08/2019 showed recovered EF. Last saw Dr. SGardiner Rhyme12/2023 for routine follow-up. Doing well, walking dog w/o exertional s/s, occasional palpitation at night.   Since last being seen in our clinic the patient reports doing "ok". He continues to walk dog and care for mother who lives next door.  He is attempting to reduce smoking, has only smoked 1 cigarette today (as of noon) he denies chest pain, palpitations, dyspnea, PND, orthopnea, nausea, vomiting, dizziness, syncope, edema, weight gain, or early satiety.     Device Information: CRT-D, implant 06/2018, indication: NICM, LBBB, NYHA III HF  Past Medical History:  Diagnosis Date   AICD (automatic cardioverter/defibrillator) present 06/19/2018   Anxiety    Aortic stenosis, severe    a. suspect low flow, low gradient severe AS.     Arthritis    Chronic kidney disease    COPD (chronic obstructive pulmonary disease) (HCC)    Coronary artery disease    Family history of adverse reaction to anesthesia    /son "passed out multiple times after anesthesia"   Frequency of urination    Heart murmur    "not since valve was replaced" (06/19/2018)   High cholesterol    Hypertension    IBD (inflammatory bowel disease)    a. s/p partial colectomy in 2009   Incidental pulmonary nodule, > 357mand < 79m63m1/11/2016   Several small nodules in left lung   LBBB (left bundle branch block)    [facility] 08[facility]: EF 15% suspect to be endstage CM 2/2 severe AS   Pneumonia 08/2017   S/P aortic valve replacement with bioprosthetic valve 10/18/2017   [facility] bovine pericardial tissue valve   Squamous carcinoma    ight ear   Tobacco abuse     Past Surgical History:  Procedure Laterality Date   AORTIC VALVE REPLACEMENT N/A 10/18/2017   Procedure: AORTIC VALVE REPLACEMENT;  Surgeon: OweRexene AlbertsD;  [facility];  Laterality: N/A;   BIV ICD INSERTION CRT-D N/A 06/19/2018   Procedure: BIV ICD INSERTION CRT-D;  Surgeon: TayEvans LanceD;  Location[facility] [place];  Service: Cardiovascular;  Laterality: N/A;   CARDIAC VALVE REPLACEMENT     COLECTOMY  2009   bowel resection, for blockage   INGUINAL HERNIA REPAIR Left 1965   INGUINAL HERNIA REPAIR Right 08/27/2018   Procedure: RIGHT INGUINAL  HERNIA REPAIR WITH MESH;  Surgeon: Jovita Kussmaul, MD;  Location: [place];  Service: General;  Laterality: Right;   INSERTION OF MESH Right 08/27/2018   Procedure: INSERTION OF MESH;  Surgeon: Jovita Kussmaul, MD;  Location: [place];  Service: General;  Laterality: Right;   IR FLUORO GUIDE CV MIDLINE PICC LEFT  09/04/2017   RIGHT/LEFT HEART CATH AND CORONARY ANGIOGRAPHY N/A 09/06/2017   Procedure: RIGHT/LEFT HEART CATH AND CORONARY ANGIOGRAPHY;  Surgeon:  Jolaine Artist, MD;  Location: [place] [facility];  Service: Cardiovascular;  Laterality: N/A;   SQUAMOUS CELL CARCINOMA EXCISION Right    "ear"   STERNOTOMY  10/18/2017   Procedure: STERNOTOMY;  Surgeon: Rexene Alberts, MD;  Location: [place];  Service: Open Heart Surgery;;   TEE WITHOUT CARDIOVERSION N/A 10/18/2017   Procedure: TRANSESOPHAGEAL ECHOCARDIOGRAM (TEE);  Surgeon: Rexene Alberts, MD;  Location: [place];  Service: Open Heart Surgery;  Laterality: N/A;    Current Outpatient Medications  Medication Sig Dispense Refill   aspirin EC 81 MG tablet 1 tablet     atorvastatin (LIPITOR) 20 MG tablet TAKE 1 TABLET ONE TIME DAILY AT 6 PM 90 tablet 0   clonazePAM (KLONOPIN) 0.5 MG tablet Take 0.5 mg by mouth daily as needed for anxiety.      losartan (COZAAR) 50 MG tablet Take 1 tablet (50 mg total) by mouth daily. 90 tablet 3   MELATONIN GUMMIES PO Take by mouth. 2 gummies nightly     metoprolol succinate (TOPROL-XL) 25 MG 24 hr tablet TAKE 1 TABLET EVERY DAY 90 tablet 3   Multiple Vitamins-Minerals (MULTIVITAMIN ADULT) CHEW Chew 1 each by mouth daily.     No current facility-administered medications for this visit.    Allergies:   Patient has no known allergies.   Social History:  The patient  reports that he has been smoking cigarettes. He started smoking about 58 years ago. He has a 26.50 pack-year smoking history. He has quit using smokeless tobacco.  His smokeless tobacco use included chew. He reports current alcohol use. He reports that he does not currently use drugs after having used the following drugs: Marijuana.   Family History:  The patient's family history includes Heart attack in his father; Hypertension in his father; Lung cancer in his brother.  ROS:  Please see the history of present illness. All other systems are reviewed and otherwise negative.   PHYSICAL EXAM:  VS:  BP 130/76   Pulse 65   Ht '5\' 10"'$  (1.778 m)   Wt 169 lb (76.7 kg)   SpO2 97%   BMI 24.25  kg/m  BMI: Body mass index is 24.25 kg/m.  GEN- The patient is well appearing, alert and oriented x 3 today.   HEENT: normocephalic, atraumatic; sclera clear, conjunctiva pink; poor dentition, neck supple, no JVP Lungs- Clear to ausculation bilaterally, normal work of breathing. Expiratory wheezes on L, rales, rhonchi Heart- Regular rate and rhythm, no murmurs, rubs or gallops, PMI not laterally displaced GI- soft, non-tender, non-distended, bowel sounds present, no hepatosplenomegaly Extremities- No peripheral edema. no clubbing or cyanosis; DP/PT/radial pulses 2+ bilaterally MS- no significant deformity or atrophy Skin- warm and dry, no rash or lesion Psych- euthymic mood, full affect Neuro- strength and sensation are intact   Device interrogation done today and reviewed by myself:  Battery good Lead thresholds, impedence, sensing stable  No episodes No changes made today  EKG is ordered. Personal review of EKG from today shows: SR  w BiV pacing, rate 65bpm  Recent Labs: No results found for requested labs within last 365 days.  No results found for requested labs within last 365 days.   CrCl cannot be calculated (Patient's most recent lab result is older than the maximum 21 days allowed.).   Wt Readings from Last 3 Encounters:  11/11/22 169 lb (76.7 kg)  10/10/22 165 lb 6.4 oz (75 kg)  04/20/22 167 lb 9.6 oz (76 kg)     Additional studies reviewed include: Previous EP, cardiology, HF notes.   TTE 10/07/2021  1. Left ventricular ejection fraction, by estimation, is 55 to 60%. The left ventricle has normal function. The left ventricle has no regional wall motion abnormalities. There is moderate concentric left ventricular hypertrophy. Left ventricular diastolic parameters are consistent with Grade I diastolic dysfunction (impaired relaxation).   2. Right ventricular systolic function is normal. The right ventricular size is normal. There is normal pulmonary artery systolic  pressure. The estimated right ventricular systolic pressure is 84.6 mmHg.   3. The mitral valve is normal in structure. Mild mitral valve regurgitation. Moderate mitral annular calcification.   4. The aortic valve has been repaired/replaced. There is a 25 mm Sapien prosthetic (TAVR) valve present in the aortic position. Procedure Date: 10/18/2017. Echo findings are consistent with normal structure and function of the aortic valve prosthesis. Mean gradient 60mHg, Vmax 1.931m, DI 0.52. There is trivial paravalvular leak.   5. The inferior vena cava is normal in size with greater than 50% respiratory variability, suggesting right atrial pressure of 3 mmHg.   6. Pacing wire seen in the RA/RV.   Comparison(s): Compared to prior TTE in 08/2019, there is no significant  change. Prior mean gradient was 1210m and is currently 8mm48m   BiV ICD Insertion 06/19/2018 CONCLUSIONS:   1. Nonischemic cardiomyopathy with Left bundle-branch block and chronic New York Heart Association class III heart failure.   2. Successful biventricular ICD implantation.   3. DFT less than or equal to 15 joules.   4. No inducible VT or VF with PES  5. No early apparent complications.   TTE 02/05/2018 - Left ventricle: LVEF is severely depressed at approximately 20 to 25% with akinesis of the septal, inferior and apical walls; hypookinesis of the distal lateral, mid/distal anteiorr walls. The cavity size was mildly dilated. Wall thickness was increased in a pattern of mild LVH.  - Aortic valve: AV prosthesis is difficult to see Peak and mean gradients through the valve are 22 and 12 mm Hg respectively.  - Mitral valve: There was mild regurgitation.    ASSESSMENT AND PLAN:  #) HFrecEF s/p BiV CRT-d Appears euvolemic on exam, stable exercise tolerance Device functioning well with greater than 99% VP No indication to updated echo   #) Tobacco Abuse #) COPD Congratulated on reduced smoking No infective s/s States mild  wheezing is baseline   Current medicines are reviewed at length with the patient today.   The patient does not have concerns regarding his medicines.  The following changes were made today:  none  Labs/ tests ordered today include:  Orders Placed This Encounter  Procedures   Basic Metabolic Panel (BMET)   Pro b natriuretic peptide (BNP)   CBC   CUP PACEART INCLINIC DEVICE CHECK   EKG 12-Lead     Disposition: Follow up with Dr. TaylLovena Le12 months   Signed, SuzaMamie Levers  11/11/22 1:07 PM   [id number] [place]  [place] [address] [phone] (office)  [phone] (fax)

## 2022-11-11 ENCOUNTER — Ambulatory Visit: Payer: Medicare HMO | Attending: Physician Assistant | Admitting: Cardiology

## 2022-11-11 ENCOUNTER — Encounter: Payer: Self-pay | Admitting: Cardiology

## 2022-11-11 VITALS — BP 130/76 | HR 65 | Ht 70.0 in | Wt 169.0 lb

## 2022-11-11 DIAGNOSIS — Z720 Tobacco use: Secondary | ICD-10-CM

## 2022-11-11 DIAGNOSIS — Z95810 Presence of automatic (implantable) cardiac defibrillator: Secondary | ICD-10-CM | POA: Diagnosis not present

## 2022-11-11 DIAGNOSIS — I428 Other cardiomyopathies: Secondary | ICD-10-CM | POA: Diagnosis not present

## 2022-11-11 DIAGNOSIS — J449 Chronic obstructive pulmonary disease, unspecified: Secondary | ICD-10-CM | POA: Diagnosis not present

## 2022-11-11 DIAGNOSIS — I5022 Chronic systolic (congestive) heart failure: Secondary | ICD-10-CM

## 2022-11-11 LAB — CUP PACEART INCLINIC DEVICE CHECK
Battery Remaining Longevity: 23 mo
Battery Voltage: 2.91 V
Brady Statistic AP VP Percent: 27.01 %
Brady Statistic AP VS Percent: 0.02 %
Brady Statistic AS VP Percent: 72.93 %
Brady Statistic AS VS Percent: 0.04 %
Brady Statistic RA Percent Paced: 27.01 %
Brady Statistic RV Percent Paced: 99.83 %
Date Time Interrogation Session: 20240105122208
HighPow Impedance: 72 Ohm
Implantable Lead Connection Status: 753985
Implantable Lead Connection Status: 753985
Implantable Lead Connection Status: 753985
Implantable Lead Implant Date: 20190813
Implantable Lead Implant Date: 20190813
Implantable Lead Implant Date: 20190813
Implantable Lead Location: 753858
Implantable Lead Location: 753859
Implantable Lead Location: 753860
Implantable Lead Model: 4598
Implantable Lead Model: 5076
Implantable Lead Model: 6935
Implantable Pulse Generator Implant Date: 20190813
Lead Channel Impedance Value: 180.5 Ohm
Lead Channel Impedance Value: 189.525
Lead Channel Impedance Value: 201.488
Lead Channel Impedance Value: 201.488
Lead Channel Impedance Value: 212.8 Ohm
Lead Channel Impedance Value: 361 Ohm
Lead Channel Impedance Value: 361 Ohm
Lead Channel Impedance Value: 361 Ohm
Lead Channel Impedance Value: 399 Ohm
Lead Channel Impedance Value: 418 Ohm
Lead Channel Impedance Value: 456 Ohm
Lead Channel Impedance Value: 456 Ohm
Lead Channel Impedance Value: 456 Ohm
Lead Channel Impedance Value: 608 Ohm
Lead Channel Impedance Value: 665 Ohm
Lead Channel Impedance Value: 722 Ohm
Lead Channel Impedance Value: 722 Ohm
Lead Channel Impedance Value: 722 Ohm
Lead Channel Pacing Threshold Amplitude: 0.5 V
Lead Channel Pacing Threshold Amplitude: 0.75 V
Lead Channel Pacing Threshold Amplitude: 1.875 V
Lead Channel Pacing Threshold Pulse Width: 0.4 ms
Lead Channel Pacing Threshold Pulse Width: 0.4 ms
Lead Channel Pacing Threshold Pulse Width: 0.4 ms
Lead Channel Sensing Intrinsic Amplitude: 1 mV
Lead Channel Sensing Intrinsic Amplitude: 1.75 mV
Lead Channel Sensing Intrinsic Amplitude: 10 mV
Lead Channel Sensing Intrinsic Amplitude: 9 mV
Lead Channel Setting Pacing Amplitude: 1.5 V
Lead Channel Setting Pacing Amplitude: 2.5 V
Lead Channel Setting Pacing Amplitude: 2.5 V
Lead Channel Setting Pacing Pulse Width: 0.4 ms
Lead Channel Setting Pacing Pulse Width: 0.4 ms
Lead Channel Setting Sensing Sensitivity: 0.3 mV
Zone Setting Status: 755011
Zone Setting Status: 755011

## 2022-11-11 NOTE — Patient Instructions (Signed)
Medication Instructions:   Your physician recommends that you continue on your current medications as directed. Please refer to the Current Medication list given to you today.   *If you need a refill on your cardiac medications before your next appointment, please call your pharmacy*   Lab Work:  BMET BNP AND CBC TODAY    If you have labs (blood work) drawn today and your tests are completely normal, you will receive your results only by: [place] (if you have MyChart) OR A paper copy in the mail If you have any lab test that is abnormal or we need to change your treatment, we will call you to review the results.   Testing/Procedures: NONE ORDERED  TODAY     Follow-Up: At [facility], you and your health needs are our priority.  As part of our continuing mission to provide you with exceptional heart care, we have created designated Provider Care Teams.  These Care Teams include your primary Cardiologist (physician) and Advanced Practice Providers (APPs -  Physician Assistants and Nurse Practitioners) who all work together to provide you with the care you need, when you need it.  We recommend signing up for the patient portal called "MyChart".  Sign up information is provided on this After Visit Summary.  MyChart is used to connect with patients for Virtual Visits (Telemedicine).  Patients are able to view lab/test results, encounter notes, upcoming appointments, etc.  Non-urgent messages can be sent to your provider as well.   To learn more about what you can do with MyChart, go to [email or web address].    Your next appointment:   1 year(s)  The format for your next appointment:   In Person  Provider:   Cristopher Peru, MD   Other Instructions   Important Information About Sugar

## 2022-11-12 LAB — PRO B NATRIURETIC PEPTIDE: NT-Pro BNP: 320 pg/mL (ref 0–376)

## 2022-11-12 LAB — CBC
Hematocrit: 46.9 % (ref 37.5–51.0)
Hemoglobin: 16.4 g/dL (ref 13.0–17.7)
MCH: 32.2 pg (ref 26.6–33.0)
MCHC: 35 g/dL (ref 31.5–35.7)
MCV: 92 fL (ref 79–97)
Platelets: 128 10*3/uL — ABNORMAL LOW (ref 150–450)
RBC: 5.1 x10E6/uL (ref 4.14–5.80)
RDW: 12.4 % (ref 11.6–15.4)
WBC: 8.3 10*3/uL (ref 3.4–10.8)

## 2022-11-12 LAB — BASIC METABOLIC PANEL
BUN/Creatinine Ratio: 11 (ref 10–24)
BUN: 11 mg/dL (ref 8–27)
CO2: 24 mmol/L (ref 20–29)
Calcium: 9.4 mg/dL (ref 8.6–10.2)
Chloride: 106 mmol/L (ref 96–106)
Creatinine, Ser: 0.97 mg/dL (ref 0.76–1.27)
Glucose: 99 mg/dL (ref 70–99)
Potassium: 4.2 mmol/L (ref 3.5–5.2)
Sodium: 144 mmol/L (ref 134–144)
eGFR: 83 mL/min/{1.73_m2} (ref >59)

## 2022-11-14 ENCOUNTER — Other Ambulatory Visit: Payer: Self-pay | Admitting: Cardiology

## 2022-11-16 DIAGNOSIS — F17210 Nicotine dependence, cigarettes, uncomplicated: Secondary | ICD-10-CM | POA: Diagnosis not present

## 2022-11-16 DIAGNOSIS — Z952 Presence of prosthetic heart valve: Secondary | ICD-10-CM | POA: Diagnosis not present

## 2022-11-16 DIAGNOSIS — J449 Chronic obstructive pulmonary disease, unspecified: Secondary | ICD-10-CM | POA: Diagnosis not present

## 2022-11-16 DIAGNOSIS — I2510 Atherosclerotic heart disease of native coronary artery without angina pectoris: Secondary | ICD-10-CM | POA: Diagnosis not present

## 2022-11-16 DIAGNOSIS — I5042 Chronic combined systolic (congestive) and diastolic (congestive) heart failure: Secondary | ICD-10-CM | POA: Diagnosis not present

## 2022-11-16 DIAGNOSIS — E1159 Type 2 diabetes mellitus with other circulatory complications: Secondary | ICD-10-CM | POA: Diagnosis not present

## 2022-11-16 DIAGNOSIS — I350 Nonrheumatic aortic (valve) stenosis: Secondary | ICD-10-CM | POA: Diagnosis not present

## 2022-11-16 DIAGNOSIS — F419 Anxiety disorder, unspecified: Secondary | ICD-10-CM | POA: Diagnosis not present

## 2022-11-16 DIAGNOSIS — E785 Hyperlipidemia, unspecified: Secondary | ICD-10-CM | POA: Diagnosis not present

## 2022-11-23 DIAGNOSIS — H43813 Vitreous degeneration, bilateral: Secondary | ICD-10-CM | POA: Diagnosis not present

## 2022-11-23 DIAGNOSIS — H35372 Puckering of macula, left eye: Secondary | ICD-10-CM | POA: Diagnosis not present

## 2022-11-23 DIAGNOSIS — H43392 Other vitreous opacities, left eye: Secondary | ICD-10-CM | POA: Diagnosis not present

## 2022-11-23 DIAGNOSIS — H2513 Age-related nuclear cataract, bilateral: Secondary | ICD-10-CM | POA: Diagnosis not present

## 2022-11-28 NOTE — Progress Notes (Signed)
Remote ICD transmission.   

## 2022-11-30 ENCOUNTER — Telehealth: Payer: Self-pay

## 2022-11-30 NOTE — Telephone Encounter (Signed)
   Pre-operative Risk Assessment    Patient Name: Kenneth Mills  DOB: 08/20/1951 MRN: [id number]      Request for Surgical Clearance    Procedure:   Retinal surgery (Vitrectomy, membrane peel, intravitreal injestion of Triesence)  Date of Surgery:  Clearance 12/19/22                                 Surgeon:  Dr. Sherlynn Stalls  Surgeon's Group or Practice Name:  [facility] Phone number:  [phone] Fax number:  [phone]   Type of Clearance Requested:   - Medical  - Pharmacy:  Hold Aspirin 7 days prior    Type of Anesthesia:  MAC   Additional requests/questions:    Tana Conch   11/30/2022, 3:15 PM

## 2022-12-13 NOTE — Telephone Encounter (Signed)
Jasmine from [employer] called to check on status of clearance.  Patient is having surgery next week.  She just needs to know if he can stop taking aspirin. She can be reached at [phone].

## 2022-12-13 NOTE — Telephone Encounter (Signed)
   Name: CHAYDEN GARRELTS  DOB: 1951/04/14  MRN: [id number]   Primary Cardiologist: Donato Heinz, MD  Chart reviewed as part of pre-operative protocol coverage. Patient was contacted 12/13/2022 in reference to pre-operative risk assessment for pending surgery as outlined below.  BURNEY CALZADILLA was last seen on 10/10/2022 by Dr. Gardiner Rhyme.  Since that day, CHEVEYO [place] has done well from a cardiac standpoint. He denies any new symptoms or concerns. He is able to complete > 4 METS without difficulty.    Therefore, based on ACC/AHA guidelines, the patient would be at acceptable risk for the planned procedure without further cardiovascular testing.   The patient was advised that if he develops new symptoms prior to surgery to contact our office to arrange for a follow-up visit, and he verbalized understanding.  Regarding ASA therapy, we recommend continuation of ASA throughout the perioperative period.  However, if the surgeon feels that cessation of ASA is required in the perioperative period, it may be stopped 5-7 days prior to surgery with a plan to resume it as soon as felt to be feasible from a surgical standpoint in the post-operative period.  I will route this recommendation to the requesting party via Epic fax function and remove from pre-op pool. Please call with questions.  Lenna Sciara, NP 12/13/2022, 11:27 AM

## 2022-12-14 NOTE — Telephone Encounter (Signed)
REQUESTING OFFICE SENT OVER A DUPLICATE REQUEST. OUR OFFICE HAS CLEARED THIS PT ON 11/30/22 AND CLEARANCE NOTES AND RECOMMENDATIONS WERE FAXED OVER ON 11/30/22 BY EMILY MONGE, NP. I WILL RE-FAX AGAIN TODAY AS THE REQUEST CAME OVER URGENT URGENT URGENT. THOUGH OUR OFFICE DID FAX NOTES ON 11/30/22.

## 2022-12-19 DIAGNOSIS — H35372 Puckering of macula, left eye: Secondary | ICD-10-CM | POA: Diagnosis not present

## 2022-12-19 DIAGNOSIS — H2512 Age-related nuclear cataract, left eye: Secondary | ICD-10-CM | POA: Diagnosis not present

## 2022-12-19 DIAGNOSIS — H3581 Retinal edema: Secondary | ICD-10-CM | POA: Diagnosis not present

## 2022-12-27 DIAGNOSIS — H33322 Round hole, left eye: Secondary | ICD-10-CM | POA: Diagnosis not present

## 2022-12-27 DIAGNOSIS — H35372 Puckering of macula, left eye: Secondary | ICD-10-CM | POA: Diagnosis not present

## 2022-12-27 DIAGNOSIS — Z9889 Other specified postprocedural states: Secondary | ICD-10-CM | POA: Diagnosis not present

## 2022-12-27 DIAGNOSIS — H2512 Age-related nuclear cataract, left eye: Secondary | ICD-10-CM | POA: Diagnosis not present

## 2023-01-09 DIAGNOSIS — H2511 Age-related nuclear cataract, right eye: Secondary | ICD-10-CM | POA: Diagnosis not present

## 2023-01-09 DIAGNOSIS — H3581 Retinal edema: Secondary | ICD-10-CM | POA: Diagnosis not present

## 2023-01-09 DIAGNOSIS — H35372 Puckering of macula, left eye: Secondary | ICD-10-CM | POA: Diagnosis not present

## 2023-01-10 DIAGNOSIS — H2511 Age-related nuclear cataract, right eye: Secondary | ICD-10-CM | POA: Diagnosis not present

## 2023-01-13 DIAGNOSIS — L814 Other melanin hyperpigmentation: Secondary | ICD-10-CM | POA: Diagnosis not present

## 2023-01-13 DIAGNOSIS — D225 Melanocytic nevi of trunk: Secondary | ICD-10-CM | POA: Diagnosis not present

## 2023-01-13 DIAGNOSIS — L821 Other seborrheic keratosis: Secondary | ICD-10-CM | POA: Diagnosis not present

## 2023-01-13 DIAGNOSIS — L728 Other follicular cysts of the skin and subcutaneous tissue: Secondary | ICD-10-CM | POA: Diagnosis not present

## 2023-01-17 DIAGNOSIS — H33322 Round hole, left eye: Secondary | ICD-10-CM | POA: Diagnosis not present

## 2023-01-17 DIAGNOSIS — H2512 Age-related nuclear cataract, left eye: Secondary | ICD-10-CM | POA: Diagnosis not present

## 2023-01-17 DIAGNOSIS — H35372 Puckering of macula, left eye: Secondary | ICD-10-CM | POA: Diagnosis not present

## 2023-01-30 ENCOUNTER — Ambulatory Visit (INDEPENDENT_AMBULATORY_CARE_PROVIDER_SITE_OTHER): Payer: Medicare HMO

## 2023-01-30 DIAGNOSIS — I5022 Chronic systolic (congestive) heart failure: Secondary | ICD-10-CM

## 2023-01-30 DIAGNOSIS — I428 Other cardiomyopathies: Secondary | ICD-10-CM

## 2023-01-30 LAB — CUP PACEART REMOTE DEVICE CHECK
Battery Remaining Longevity: 22 mo
Battery Voltage: 2.89 V
Brady Statistic AP VP Percent: 20.97 %
Brady Statistic AP VS Percent: 0.02 %
Brady Statistic AS VP Percent: 78.75 %
Brady Statistic AS VS Percent: 0.27 %
Brady Statistic RA Percent Paced: 20.88 %
Brady Statistic RV Percent Paced: 99.15 %
Date Time Interrogation Session: 20240325012404
HighPow Impedance: 70 Ohm
Implantable Lead Connection Status: 753985
Implantable Lead Connection Status: 753985
Implantable Lead Connection Status: 753985
Implantable Lead Implant Date: 20190813
Implantable Lead Implant Date: 20190813
Implantable Lead Implant Date: 20190813
Implantable Lead Location: 753858
Implantable Lead Location: 753859
Implantable Lead Location: 753860
Implantable Lead Model: 4598
Implantable Lead Model: 5076
Implantable Lead Model: 6935
Implantable Pulse Generator Implant Date: 20190813
Lead Channel Impedance Value: 180.5 Ohm
Lead Channel Impedance Value: 189.525
Lead Channel Impedance Value: 205.114
Lead Channel Impedance Value: 205.114
Lead Channel Impedance Value: 216.848
Lead Channel Impedance Value: 361 Ohm
Lead Channel Impedance Value: 361 Ohm
Lead Channel Impedance Value: 361 Ohm
Lead Channel Impedance Value: 399 Ohm
Lead Channel Impedance Value: 399 Ohm
Lead Channel Impedance Value: 418 Ohm
Lead Channel Impedance Value: 418 Ohm
Lead Channel Impedance Value: 475 Ohm
Lead Channel Impedance Value: 608 Ohm
Lead Channel Impedance Value: 665 Ohm
Lead Channel Impedance Value: 722 Ohm
Lead Channel Impedance Value: 760 Ohm
Lead Channel Impedance Value: 760 Ohm
Lead Channel Pacing Threshold Amplitude: 0.5 V
Lead Channel Pacing Threshold Amplitude: 0.875 V
Lead Channel Pacing Threshold Amplitude: 2.125 V
Lead Channel Pacing Threshold Pulse Width: 0.4 ms
Lead Channel Pacing Threshold Pulse Width: 0.4 ms
Lead Channel Pacing Threshold Pulse Width: 0.4 ms
Lead Channel Sensing Intrinsic Amplitude: 1.75 mV
Lead Channel Sensing Intrinsic Amplitude: 1.75 mV
Lead Channel Sensing Intrinsic Amplitude: 5.75 mV
Lead Channel Sensing Intrinsic Amplitude: 5.75 mV
Lead Channel Setting Pacing Amplitude: 1.5 V
Lead Channel Setting Pacing Amplitude: 2.5 V
Lead Channel Setting Pacing Amplitude: 2.75 V
Lead Channel Setting Pacing Pulse Width: 0.4 ms
Lead Channel Setting Pacing Pulse Width: 0.4 ms
Lead Channel Setting Sensing Sensitivity: 0.3 mV
Zone Setting Status: 755011
Zone Setting Status: 755011

## 2023-02-15 ENCOUNTER — Telehealth: Payer: Self-pay | Admitting: *Deleted

## 2023-02-15 NOTE — Telephone Encounter (Signed)
I s/w the DDS office in regard to fax that our office received. I confirmed there is no procedure to be done. Was stated they are just needing to know if in the future if the pt will need SBE. I did inform the DDS office that the cardiologist generally will not provide recommendations unless they have a clearance request to review.   I did tell the DDS office that as the pt has had a valve replacement that it would be better to just fax over a clearance request to our office and we will assessment if SBE needed along with if we cleared the pt. DDS is agreeable to plan of care. I will fax notes to DDS as FYI of conversation today.

## 2023-03-13 NOTE — Progress Notes (Signed)
Remote ICD transmission.   

## 2023-04-24 ENCOUNTER — Telehealth: Payer: Self-pay | Admitting: *Deleted

## 2023-04-24 NOTE — Telephone Encounter (Signed)
   Patient Name: Kenneth Mills  DOB: 1951-10-30 MRN: [id number]  Primary Cardiologist: Little Ishikawa, MD  Chart reviewed as part of pre-operative protocol coverage.    Simple dental extractions (i.e. 1-2 teeth) and dental cleanings are considered low risk procedures per guidelines and generally do not require any specific cardiac clearance. It is also generally accepted that for simple extractions and dental cleanings, there is no need to interrupt blood thinner therapy.  SBE prophylaxisis required for the patient from a cardiac standpoint.  I will route this recommendation to the requesting party via Epic fax function and remove from pre-op pool.  Please call with questions.  Joylene Grapes, NP 04/24/2023, 4:11 PM

## 2023-04-24 NOTE — Telephone Encounter (Signed)
   Pre-operative Risk Assessment    Patient Name: Kenneth Mills  DOB: 10-23-1951 MRN: [id number]     Request for Surgical Clearance    Procedure:   DENTAL CLEANING/EXAM  Date of Surgery:  Clearance 05/09/23                                 Surgeon:  DR. Cammie Sickle, DDS Surgeon's Group or Practice Name:  [facility], [facility] Phone number:  [phone] Fax number:  [phone]   Type of Clearance Requested:   - Medical ; NO MEDICATIONS LISTED AS NEEDING TO BE HELD; HOWEVER REQUESTING IF PT NEEDS SBE; IF SO PHARMACY IS [facility]   Type of Anesthesia:  None    Additional requests/questions:    Elpidio Anis   04/24/2023, 3:40 PM

## 2023-05-01 ENCOUNTER — Ambulatory Visit: Payer: Medicare HMO

## 2023-05-01 DIAGNOSIS — I428 Other cardiomyopathies: Secondary | ICD-10-CM | POA: Diagnosis not present

## 2023-05-02 LAB — CUP PACEART REMOTE DEVICE CHECK
Battery Remaining Longevity: 19 mo
Battery Voltage: 2.87 V
Brady Statistic AP VP Percent: 20.19 %
Brady Statistic AP VS Percent: 0.02 %
Brady Statistic AS VP Percent: 79.58 %
Brady Statistic AS VS Percent: 0.22 %
Brady Statistic RA Percent Paced: 20.15 %
Brady Statistic RV Percent Paced: 99.42 %
Date Time Interrogation Session: 20240624012403
HighPow Impedance: 84 Ohm
Implantable Lead Connection Status: 753985
Implantable Lead Connection Status: 753985
Implantable Lead Connection Status: 753985
Implantable Lead Implant Date: 20190813
Implantable Lead Implant Date: 20190813
Implantable Lead Implant Date: 20190813
Implantable Lead Location: 753858
Implantable Lead Location: 753859
Implantable Lead Location: 753860
Implantable Lead Model: 4598
Implantable Lead Model: 5076
Implantable Lead Model: 6935
Implantable Pulse Generator Implant Date: 20190813
Lead Channel Impedance Value: 199.5 Ohm
Lead Channel Impedance Value: 204.14 Ohm
Lead Channel Impedance Value: 216.848
Lead Channel Impedance Value: 216.848
Lead Channel Impedance Value: 222.34 Ohm
Lead Channel Impedance Value: 361 Ohm
Lead Channel Impedance Value: 399 Ohm
Lead Channel Impedance Value: 399 Ohm
Lead Channel Impedance Value: 418 Ohm
Lead Channel Impedance Value: 456 Ohm
Lead Channel Impedance Value: 456 Ohm
Lead Channel Impedance Value: 475 Ohm
Lead Channel Impedance Value: 475 Ohm
Lead Channel Impedance Value: 665 Ohm
Lead Channel Impedance Value: 665 Ohm
Lead Channel Impedance Value: 760 Ohm
Lead Channel Impedance Value: 779 Ohm
Lead Channel Impedance Value: 779 Ohm
Lead Channel Pacing Threshold Amplitude: 0.5 V
Lead Channel Pacing Threshold Amplitude: 0.75 V
Lead Channel Pacing Threshold Amplitude: 2 V
Lead Channel Pacing Threshold Pulse Width: 0.4 ms
Lead Channel Pacing Threshold Pulse Width: 0.4 ms
Lead Channel Pacing Threshold Pulse Width: 0.4 ms
Lead Channel Sensing Intrinsic Amplitude: 1.875 mV
Lead Channel Sensing Intrinsic Amplitude: 1.875 mV
Lead Channel Sensing Intrinsic Amplitude: 11.25 mV
Lead Channel Sensing Intrinsic Amplitude: 11.25 mV
Lead Channel Setting Pacing Amplitude: 1.5 V
Lead Channel Setting Pacing Amplitude: 2.5 V
Lead Channel Setting Pacing Amplitude: 2.5 V
Lead Channel Setting Pacing Pulse Width: 0.4 ms
Lead Channel Setting Pacing Pulse Width: 0.4 ms
Lead Channel Setting Sensing Sensitivity: 0.3 mV
Zone Setting Status: 755011
Zone Setting Status: 755011

## 2023-05-03 MED ORDER — AMOXICILLIN 500 MG PO TABS: ORAL_TABLET | ORAL | 1 refills | Status: AC

## 2023-05-03 NOTE — Telephone Encounter (Signed)
Pt c/o medication issue:  1. Name of Medication: Amoxicillin  2. How are you currently taking this medication (dosage and times per day)?   3. Are you having a reaction (difficulty breathing--STAT)?   4. What is your medication issue?   Patient stated he will need 2 Amoxicillin pills  to be taken prior to his dental visit.  Patient wants prescription sent to [facility] [address], [place] - [address]. Patient wants call back to confirm.

## 2023-05-03 NOTE — Telephone Encounter (Signed)
I will forward this to the pre op APP for further directions as the dosage to ordered for Amoxicillin

## 2023-05-03 NOTE — Telephone Encounter (Signed)
   Patient Name: Kenneth Mills  DOB: [birth year] MRN: [id number]  Primary Cardiologist: Little Ishikawa, MD  Chart reviewed as part of pre-operative protocol coverage.   Prescription sent for amoxicillin 2 g to be taken 30 to 60 minutes prior to patient's scheduled dental procedure.  Prescription sent to requested pharmacy at [facility].   Napoleon Form, Leodis Rains, NP 05/03/2023, 5:25 PM

## 2023-05-03 NOTE — Addendum Note (Signed)
Addended by: Elizabeth Palau on: 05/03/2023 05:27 PM   Modules accepted: Orders

## 2023-05-22 NOTE — Progress Notes (Signed)
 Remote ICD transmission.   

## 2023-05-26 DIAGNOSIS — E1159 Type 2 diabetes mellitus with other circulatory complications: Secondary | ICD-10-CM | POA: Diagnosis not present

## 2023-05-26 DIAGNOSIS — Z1211 Encounter for screening for malignant neoplasm of colon: Secondary | ICD-10-CM | POA: Diagnosis not present

## 2023-05-26 DIAGNOSIS — J449 Chronic obstructive pulmonary disease, unspecified: Secondary | ICD-10-CM | POA: Diagnosis not present

## 2023-05-26 DIAGNOSIS — F419 Anxiety disorder, unspecified: Secondary | ICD-10-CM | POA: Diagnosis not present

## 2023-05-26 DIAGNOSIS — Z0000 Encounter for general adult medical examination without abnormal findings: Secondary | ICD-10-CM | POA: Diagnosis not present

## 2023-05-26 DIAGNOSIS — E785 Hyperlipidemia, unspecified: Secondary | ICD-10-CM | POA: Diagnosis not present

## 2023-05-26 DIAGNOSIS — E119 Type 2 diabetes mellitus without complications: Secondary | ICD-10-CM | POA: Diagnosis not present

## 2023-05-26 DIAGNOSIS — I5042 Chronic combined systolic (congestive) and diastolic (congestive) heart failure: Secondary | ICD-10-CM | POA: Diagnosis not present

## 2023-05-29 DIAGNOSIS — Z1211 Encounter for screening for malignant neoplasm of colon: Secondary | ICD-10-CM | POA: Diagnosis not present

## 2023-06-12 ENCOUNTER — Telehealth: Payer: Self-pay | Admitting: *Deleted

## 2023-06-12 NOTE — Telephone Encounter (Signed)
   Pre-operative Risk Assessment    Patient Name: Kenneth Mills  DOB: Jun 27, 1951 MRN: [id number]      Request for Surgical Clearance    Procedure:  Dental Extraction - Amount of Teeth to be Pulled:  NONE PULLED, PER NOTE, PT NEEDS RESTORATIVE WORK ON TOOTH # 11  Date of Surgery:  Clearance 06/19/23                                 Surgeon:   Surgeon's Group or Practice Name:  [facility] Phone number:  [phone] Fax number:  [phone]   Type of Clearance Requested:   - Medical    Type of Anesthesia:  Not Indicated   Additional requests/questions:    [name]   06/12/2023, 4:01 PM

## 2023-06-13 NOTE — Telephone Encounter (Signed)
I spoke with dentist office. Pt needs a cavity cleaned out and then the cavity will be filled. No sedation. Pt will be numbed in the area only.

## 2023-06-13 NOTE — Telephone Encounter (Signed)
   Patient Name: Kenneth Mills  DOB: 10-28-1951 MRN: [id number]  Primary Cardiologist: Little Ishikawa, MD  Chart reviewed as part of pre-operative protocol coverage.   Spoke with patient via telephone on 06/13/2023. He is stable from a cardiac standpoint with no complaints of chest pain or shortness of breath. He is able to perform all of his normal activities without difficulty. He is an acceptable risk to proceed with cavity clean out and filling with local anesthesia.   SBE prophylaxis is required for the patient from a cardiac standpoint. Patient already has prescription and understands how to take it prior to dental appointment.   I will route this recommendation to the requesting party via Epic fax function and remove from pre-op pool.  Please call with questions.  Carlos Levering, NP 06/13/2023, 10:14 AM

## 2023-06-13 NOTE — Telephone Encounter (Signed)
Pre-op team,  Will you please get more detail on what procedure patient is having done? Will he be receiving local anesthesia or sedation?   Thank you!  DW

## 2023-07-31 ENCOUNTER — Ambulatory Visit (INDEPENDENT_AMBULATORY_CARE_PROVIDER_SITE_OTHER): Payer: Medicare HMO

## 2023-07-31 DIAGNOSIS — I428 Other cardiomyopathies: Secondary | ICD-10-CM | POA: Diagnosis not present

## 2023-07-31 DIAGNOSIS — I5022 Chronic systolic (congestive) heart failure: Secondary | ICD-10-CM

## 2023-08-01 LAB — CUP PACEART REMOTE DEVICE CHECK
Battery Remaining Longevity: 18 mo
Battery Voltage: 2.86 V
Brady Statistic AP VP Percent: 26.11 %
Brady Statistic AP VS Percent: 0.02 %
Brady Statistic AS VP Percent: 73.73 %
Brady Statistic AS VS Percent: 0.14 %
Brady Statistic RA Percent Paced: 25.98 %
Brady Statistic RV Percent Paced: 99.15 %
Date Time Interrogation Session: 20240923002203
HighPow Impedance: 74 Ohm
Implantable Lead Connection Status: 753985
Implantable Lead Connection Status: 753985
Implantable Lead Connection Status: 753985
Implantable Lead Implant Date: 20190813
Implantable Lead Implant Date: 20190813
Implantable Lead Implant Date: 20190813
Implantable Lead Location: 753858
Implantable Lead Location: 753859
Implantable Lead Location: 753860
Implantable Lead Model: 4598
Implantable Lead Model: 5076
Implantable Lead Model: 6935
Implantable Pulse Generator Implant Date: 20190813
Lead Channel Impedance Value: 180.5 Ohm
Lead Channel Impedance Value: 189.525
Lead Channel Impedance Value: 205.114
Lead Channel Impedance Value: 205.114
Lead Channel Impedance Value: 216.848
Lead Channel Impedance Value: 361 Ohm
Lead Channel Impedance Value: 361 Ohm
Lead Channel Impedance Value: 399 Ohm
Lead Channel Impedance Value: 399 Ohm
Lead Channel Impedance Value: 418 Ohm
Lead Channel Impedance Value: 418 Ohm
Lead Channel Impedance Value: 475 Ohm
Lead Channel Impedance Value: 475 Ohm
Lead Channel Impedance Value: 608 Ohm
Lead Channel Impedance Value: 665 Ohm
Lead Channel Impedance Value: 722 Ohm
Lead Channel Impedance Value: 760 Ohm
Lead Channel Impedance Value: 760 Ohm
Lead Channel Pacing Threshold Amplitude: 0.5 V
Lead Channel Pacing Threshold Amplitude: 0.875 V
Lead Channel Pacing Threshold Amplitude: 2.125 V
Lead Channel Pacing Threshold Pulse Width: 0.4 ms
Lead Channel Pacing Threshold Pulse Width: 0.4 ms
Lead Channel Pacing Threshold Pulse Width: 0.4 ms
Lead Channel Sensing Intrinsic Amplitude: 1.125 mV
Lead Channel Sensing Intrinsic Amplitude: 1.125 mV
Lead Channel Sensing Intrinsic Amplitude: 10.375 mV
Lead Channel Sensing Intrinsic Amplitude: 10.375 mV
Lead Channel Setting Pacing Amplitude: 1.5 V
Lead Channel Setting Pacing Amplitude: 2.5 V
Lead Channel Setting Pacing Amplitude: 3 V
Lead Channel Setting Pacing Pulse Width: 0.4 ms
Lead Channel Setting Pacing Pulse Width: 0.4 ms
Lead Channel Setting Sensing Sensitivity: 0.3 mV
Zone Setting Status: 755011
Zone Setting Status: 755011

## 2023-08-14 NOTE — Progress Notes (Unsigned)
Cardiology Office Note:    Date:  08/15/2023   ID:  Kenneth Mills, DOB [birth year], MRN [id number]  PCP:  Joycelyn Rua, MD  Cardiologist:  Little Ishikawa, MD  Electrophysiologist:  Lewayne Bunting, MD   Referring MD: Joycelyn Rua, MD   Chief Complaint  Patient presents with   Congestive Heart Failure    History of Present Illness:    Kenneth Mills is a 72 y.o. male with a hx of COPD, IBD status post partial colectomy, chronic combined systolic and diastolic heart failure, severe aortic stenosis status post AVR 10/2017.  He was admitted in October 2018 with acute respiratory failure with parainfluenza requiring intubation.  Echocardiogram at that time showed severe LV systolic dysfunction and critical aortic stenosis.  Cardiac catheterization showed nonobstructive CAD, severe AS.  Underwent surgical aortic valve replacement on 10/18/2017.  Underwent CRT D implant on 06/19/2018.  Echocardiogram 08/15/2019 showed EF 55 to 60%.  Echo 10/07/2021 showed EF 55 to 60%, moderate LVH, grade 1 diastolic dysfunction, normal RV function, normal functioning prosthetic aortic valve.  Since last clinic visit, he reports he is doing well.  Denies any chest pain.  But does report some dyspnea with going up hills.  Denies any lower extremity edema or palpitations.  Reports occasional lightheadedness but denies any syncope.  He walks his dog for 20 minutes/day.  He is smoking 0.75 packs per day.   Wt Readings from Last 3 Encounters:  08/15/23 167 lb 9.6 oz (76 kg)  11/11/22 169 lb (76.7 kg)  10/10/22 165 lb 6.4 oz (75 kg)     Past Medical History:  Diagnosis Date   AICD (automatic cardioverter/defibrillator) present 06/19/2018   Anxiety    Aortic stenosis, severe    a. suspect low flow, low gradient severe AS.    Arthritis    Chronic kidney disease    COPD (chronic obstructive pulmonary disease) (HCC)    Coronary artery disease    Family history of adverse reaction to anesthesia     /son "passed out multiple times after anesthesia"   Frequency of urination    Heart murmur    "not since valve was replaced" (06/19/2018)   High cholesterol    Hypertension    IBD (inflammatory bowel disease)    a. s/p partial colectomy in 2009   Incidental pulmonary nodule, > 3mm and < 8mm 09/07/2017   Several small nodules in left lung   LBBB (left bundle branch block)    NICM (nonischemic cardiomyopathy) (HCC)    a. 08/2014: EF 15% suspect to be endstage CM 2/2 severe AS   Pneumonia 08/2017   S/P aortic valve replacement with bioprosthetic valve 10/18/2017   25 mm [facility] Ease stented bovine pericardial tissue valve   Squamous carcinoma    ight ear   Tobacco abuse     Past Surgical History:  Procedure Laterality Date   AORTIC VALVE REPLACEMENT N/A 10/18/2017   Procedure: AORTIC VALVE REPLACEMENT;  Surgeon: Purcell Nails, MD;  Location: [facility] OR;  Service: Open Heart Surgery;  Laterality: N/A;   BIV ICD INSERTION CRT-D N/A 06/19/2018   Procedure: BIV ICD INSERTION CRT-D;  Surgeon: Marinus Maw, MD;  Location: [facility] INVASIVE CV LAB;  Service: Cardiovascular;  Laterality: N/A;   CARDIAC VALVE REPLACEMENT     COLECTOMY  2009   bowel resection, for blockage   INGUINAL HERNIA REPAIR Left 1965   INGUINAL HERNIA REPAIR Right 08/27/2018   Procedure: RIGHT INGUINAL HERNIA REPAIR WITH MESH;  Surgeon: Griselda Miner, MD;  Location: [facility] OR;  Service: General;  Laterality: Right;   INSERTION OF MESH Right 08/27/2018   Procedure: INSERTION OF MESH;  Surgeon: Griselda Miner, MD;  Location: [facility] OR;  Service: General;  Laterality: Right;   IR FLUORO GUIDE CV MIDLINE PICC LEFT  09/04/2017   RIGHT/LEFT HEART CATH AND CORONARY ANGIOGRAPHY N/A 09/06/2017   Procedure: RIGHT/LEFT HEART CATH AND CORONARY ANGIOGRAPHY;  Surgeon: Dolores Patty, MD;  Location: [facility] INVASIVE CV LAB;  Service: Cardiovascular;  Laterality: N/A;   SQUAMOUS CELL CARCINOMA EXCISION Right    "ear"   STERNOTOMY   10/18/2017   Procedure: STERNOTOMY;  Surgeon: Purcell Nails, MD;  Location: [facility] OR;  Service: Open Heart Surgery;;   TEE WITHOUT CARDIOVERSION N/A 10/18/2017   Procedure: TRANSESOPHAGEAL ECHOCARDIOGRAM (TEE);  Surgeon: Purcell Nails, MD;  Location: [facility] OR;  Service: Open Heart Surgery;  Laterality: N/A;    Current Medications: Current Meds  Medication Sig   amoxicillin (AMOXIL) 500 MG tablet Take 4 tablets (2000 mg) 30-60 minutes prior to dental procedures   aspirin EC 81 MG tablet 1 tablet   atorvastatin (LIPITOR) 20 MG tablet TAKE 1 TABLET ONE TIME DAILY AT 6PM   clonazePAM (KLONOPIN) 0.5 MG tablet Take 0.5 mg by mouth daily as needed for anxiety.    losartan (COZAAR) 50 MG tablet Take 1 tablet (50 mg total) by mouth daily.   MELATONIN GUMMIES PO Take by mouth. 2 gummies nightly   metoprolol succinate (TOPROL-XL) 25 MG 24 hr tablet TAKE 1 TABLET EVERY DAY   Multiple Vitamins-Minerals (MULTIVITAMIN ADULT) CHEW Chew 1 each by mouth daily.     Allergies:   Patient has no known allergies.   Social History   Socioeconomic History   Marital status: Divorced    Spouse name: Not on file   Number of children: Not on file   Years of education: Not on file   Highest education level: Not on file  Occupational History   Not on file  Tobacco Use   Smoking status: Some Days    Current packs/day: 0.50    Average packs/day: 0.5 packs/day for 58.8 years (29.4 ttl pk-yrs)    Types: Cigarettes    Start date: 1966   Smokeless tobacco: Former    Types: Engineer, drilling   Vaping status: Former  Substance and Sexual Activity   Alcohol use: Yes    Comment: 06/19/2018 "couple beers/year"   Drug use: Not Currently    Types: Marijuana   Sexual activity: Not on file  Other Topics Concern   Not on file  Social History Narrative   Not on file   Social Determinants of Health   Financial Resource Strain: Not on file  Food Insecurity: Not on file  Transportation Needs: Not on file   Physical Activity: Not on file  Stress: Not on file  Social Connections: Not on file     Family History: The patient's family history includes Heart attack in his father; Hypertension in his father; Lung cancer in his brother.  ROS:   Please see the history of present illness.     All other systems reviewed and are negative.  EKGs/Labs/Other Studies Reviewed:    The following studies were reviewed today:   EKG:  08/15/2023: Atrial sensed, ventricular paced rhythm, rate 66 04/20/2022: AV paced, rate 60 10/11/21: atrial sensed, ventricular paced, rate 62   Recent Labs: 11/11/2022: BUN 11; Creatinine, Ser 0.97; Hemoglobin 16.4; NT-Pro  BNP 320; Platelets 128; Potassium 4.2; Sodium 144  Recent Lipid Panel    Component Value Date/Time   CHOL 119 04/28/2021 0925   TRIG 116 04/28/2021 0925   HDL 43 04/28/2021 0925   CHOLHDL 2.8 04/28/2021 0925   CHOLHDL 2.8 09/07/2017 0335   VLDL 18 09/07/2017 0335   LDLCALC 55 04/28/2021 0925    Physical Exam:    VS:  BP 122/64   Pulse 66   Ht 5' 10.5" (1.791 m)   Wt 167 lb 9.6 oz (76 kg)   SpO2 99%   BMI 23.71 kg/m     Wt Readings from Last 3 Encounters:  08/15/23 167 lb 9.6 oz (76 kg)  11/11/22 169 lb (76.7 kg)  10/10/22 165 lb 6.4 oz (75 kg)     GEN:  Well nourished, well developed in no acute distress HEENT: Normal NECK: No JVD; No carotid bruits LYMPHATICS: No lymphadenopathy CARDIAC: RRR, 2/6 systolic murmur RESPIRATORY:  Clear to auscultation without rales, wheezing or rhonchi  ABDOMEN: Soft, non-tender, non-distended MUSCULOSKELETAL:  No edema; No deformity  SKIN: Warm and dry NEUROLOGIC:  Alert and oriented x 3 PSYCHIATRIC:  Normal affect   ASSESSMENT:    1. Chronic combined systolic and diastolic heart failure due to valvular disease (HCC)   2. S/P aortic valve replacement with bioprosthetic valve   3. CAD in native artery   4. Essential hypertension   5. Hyperlipidemia, unspecified hyperlipidemia type   6.  Tobacco abuse       PLAN:    Chronic combined systolic and diastolic CHF: Echo 09/04/17 LVEF 15%, RV normal, critical low gradient AS; TEE 12/18: EF 20-25%, normal RV, mild MR, trace TR.  Echo 02/2018 EF 20-25%. Now s/p CRT-D 06/19/18.  Echo 08/15/19 EF 55-60% complete recovery after CRT-D.  Echo 10/07/2021 showed EF 55 to 60%, moderate LVH, grade 1 diastolic dysfunction, normal RV function, normal functioning prosthetic aortic valve. - Continue losartan 50 mg daily.  Sherryll Burger was discontinued due to cost - Continue Toprol XL 25 mg  - Off spiro by his choice - Update echocardiogram   Aortic stenosis: s/p bioprosthetic AVR 10/18/2017.  No symptoms to suggest prosthetic valve dysfunction.  Normal functioning valve on echo 10/07/2021, trivial PVL and mean gradient 8 mmHg.  Tobacco abuse: Continues to smoke, currently at about 1ppd.  Counseled on the risks of tobacco use and cessation strongly encouraged.     CAD: Mild non-obstructive CAD with 50% proximal RCA with spasm on cath 09/06/17.  No anginal symptoms. - Continue atorvastatin 20 mg daily + ASA 81 mg daily   LBBB: S/p CRT-D 06/19/2018.  EF improved. - Follows with EP  Hypertension: Continue losartan and Toprol-XL.  Appears controlled.  Hyperlipidemia: continue atorvastatin 20 mg daily, LDL 54 on 05/2023  T2DM: A1c 6.6% 05/2023   RTC in 6 months   Medication Adjustments/Labs and Tests Ordered: Current medicines are reviewed at length with the patient today.  Concerns regarding medicines are outlined above.  Orders Placed This Encounter  Procedures   EKG 12-Lead   ECHOCARDIOGRAM COMPLETE     No orders of the defined types were placed in this encounter.    Patient Instructions  Medication Instructions:  Continue all current medication *If you need a refill on your cardiac medications before your next appointment, please call your pharmacy*   Lab Work: none If you have labs (blood work) drawn today and your tests are  completely normal, you will receive your results only by: [employer] (  if you have MyChart) OR A paper copy in the mail If you have any lab test that is abnormal or we need to change your treatment, we will call you to review the results.   Testing/Procedures:  ECHO Please schedule 11/22/23 at [facility] office  Follow-Up: At [facility], you and your health needs are our priority.  As part of our continuing mission to provide you with exceptional heart care, we have created designated Provider Care Teams.  These Care Teams include your primary Cardiologist (physician) and Advanced Practice Providers (APPs -  Physician Assistants and Nurse Practitioners) who all work together to provide you with the care you need, when you need it.  We recommend signing up for the patient portal called "MyChart".  Sign up information is provided on this After Visit Summary.  MyChart is used to connect with patients for Virtual Visits (Telemedicine).  Patients are able to view lab/test results, encounter notes, upcoming appointments, etc.  Non-urgent messages can be sent to your provider as well.   To learn more about what you can do with MyChart, go to [email or web address].    Your next appointment:  6 months. Please call in Jan 2025 to schedule appt for Apri    Provider: Dr. Bjorn Pippin          Signed, Little Ishikawa, MD  08/15/2023 1:17 PM    [facility]

## 2023-08-14 NOTE — Progress Notes (Signed)
Remote ICD transmission.   

## 2023-08-15 ENCOUNTER — Ambulatory Visit: Payer: Medicare HMO | Attending: Cardiology | Admitting: Cardiology

## 2023-08-15 VITALS — BP 122/64 | HR 66 | Ht 70.5 in | Wt 167.6 lb

## 2023-08-15 DIAGNOSIS — Z953 Presence of xenogenic heart valve: Secondary | ICD-10-CM | POA: Diagnosis not present

## 2023-08-15 DIAGNOSIS — I5042 Chronic combined systolic (congestive) and diastolic (congestive) heart failure: Secondary | ICD-10-CM

## 2023-08-15 DIAGNOSIS — Z720 Tobacco use: Secondary | ICD-10-CM

## 2023-08-15 DIAGNOSIS — I38 Endocarditis, valve unspecified: Secondary | ICD-10-CM

## 2023-08-15 DIAGNOSIS — I2510 Atherosclerotic heart disease of native coronary artery without angina pectoris: Secondary | ICD-10-CM | POA: Diagnosis not present

## 2023-08-15 DIAGNOSIS — I10 Essential (primary) hypertension: Secondary | ICD-10-CM

## 2023-08-15 DIAGNOSIS — E785 Hyperlipidemia, unspecified: Secondary | ICD-10-CM

## 2023-08-15 NOTE — Patient Instructions (Addendum)
Medication Instructions:  Continue all current medication *If you need a refill on your cardiac medications before your next appointment, please call your pharmacy*   Lab Work: none If you have labs (blood work) drawn today and your tests are completely normal, you will receive your results only by: MyChart Message (if you have MyChart) OR A paper copy in the mail If you have any lab test that is abnormal or we need to change your treatment, we will call you to review the results.   Testing/Procedures:  ECHO Please schedule 11/22/23 at [facility] office  Follow-Up: At [facility], you and your health needs are our priority.  As part of our continuing mission to provide you with exceptional heart care, we have created designated Provider Care Teams.  These Care Teams include your primary Cardiologist (physician) and Advanced Practice Providers (APPs -  Physician Assistants and Nurse Practitioners) who all work together to provide you with the care you need, when you need it.  We recommend signing up for the patient portal called "MyChart".  Sign up information is provided on this After Visit Summary.  MyChart is used to connect with patients for Virtual Visits (Telemedicine).  Patients are able to view lab/test results, encounter notes, upcoming appointments, etc.  Non-urgent messages can be sent to your provider as well.   To learn more about what you can do with MyChart, go to [email or web address].    Your next appointment:  6 months. Please call in Jan 2025 to schedule appt for Apri    Provider: Dr. Bjorn Pippin

## 2023-10-30 ENCOUNTER — Ambulatory Visit (INDEPENDENT_AMBULATORY_CARE_PROVIDER_SITE_OTHER): Payer: Medicare HMO

## 2023-10-30 DIAGNOSIS — I38 Endocarditis, valve unspecified: Secondary | ICD-10-CM | POA: Diagnosis not present

## 2023-10-30 DIAGNOSIS — I5042 Chronic combined systolic (congestive) and diastolic (congestive) heart failure: Secondary | ICD-10-CM

## 2023-10-30 DIAGNOSIS — I428 Other cardiomyopathies: Secondary | ICD-10-CM

## 2023-11-02 LAB — CUP PACEART REMOTE DEVICE CHECK
Battery Remaining Longevity: 15 mo
Battery Voltage: 2.86 V
Brady Statistic AP VP Percent: 21.52 %
Brady Statistic AP VS Percent: 0.02 %
Brady Statistic AS VP Percent: 78.44 %
Brady Statistic AS VS Percent: 0.03 %
Brady Statistic RA Percent Paced: 21.51 %
Brady Statistic RV Percent Paced: 99.76 %
Date Time Interrogation Session: 20241225095106
HighPow Impedance: 73 Ohm
Implantable Lead Connection Status: 753985
Implantable Lead Connection Status: 753985
Implantable Lead Connection Status: 753985
Implantable Lead Implant Date: 20190813
Implantable Lead Implant Date: 20190813
Implantable Lead Implant Date: 20190813
Implantable Lead Location: 753858
Implantable Lead Location: 753859
Implantable Lead Location: 753860
Implantable Lead Model: 4598
Implantable Lead Model: 5076
Implantable Lead Model: 6935
Implantable Pulse Generator Implant Date: 20190813
Lead Channel Impedance Value: 171 Ohm
Lead Channel Impedance Value: 184.154
Lead Channel Impedance Value: 198.837
Lead Channel Impedance Value: 198.837
Lead Channel Impedance Value: 216.848
Lead Channel Impedance Value: 342 Ohm
Lead Channel Impedance Value: 342 Ohm
Lead Channel Impedance Value: 399 Ohm
Lead Channel Impedance Value: 418 Ohm
Lead Channel Impedance Value: 418 Ohm
Lead Channel Impedance Value: 456 Ohm
Lead Channel Impedance Value: 475 Ohm
Lead Channel Impedance Value: 475 Ohm
Lead Channel Impedance Value: 589 Ohm
Lead Channel Impedance Value: 608 Ohm
Lead Channel Impedance Value: 703 Ohm
Lead Channel Impedance Value: 703 Ohm
Lead Channel Impedance Value: 703 Ohm
Lead Channel Pacing Threshold Amplitude: 0.5 V
Lead Channel Pacing Threshold Amplitude: 0.5 V
Lead Channel Pacing Threshold Amplitude: 1.875 V
Lead Channel Pacing Threshold Pulse Width: 0.4 ms
Lead Channel Pacing Threshold Pulse Width: 0.4 ms
Lead Channel Pacing Threshold Pulse Width: 0.4 ms
Lead Channel Sensing Intrinsic Amplitude: 2.125 mV
Lead Channel Sensing Intrinsic Amplitude: 2.125 mV
Lead Channel Sensing Intrinsic Amplitude: 6.625 mV
Lead Channel Sensing Intrinsic Amplitude: 6.625 mV
Lead Channel Setting Pacing Amplitude: 1.5 V
Lead Channel Setting Pacing Amplitude: 2.5 V
Lead Channel Setting Pacing Amplitude: 2.5 V
Lead Channel Setting Pacing Pulse Width: 0.4 ms
Lead Channel Setting Pacing Pulse Width: 0.4 ms
Lead Channel Setting Sensing Sensitivity: 0.3 mV
Zone Setting Status: 755011
Zone Setting Status: 755011

## 2023-11-22 ENCOUNTER — Ambulatory Visit (HOSPITAL_BASED_OUTPATIENT_CLINIC_OR_DEPARTMENT_OTHER): Payer: Medicare HMO

## 2023-11-22 ENCOUNTER — Encounter: Payer: Self-pay | Admitting: Internal Medicine

## 2023-11-22 ENCOUNTER — Ambulatory Visit (HOSPITAL_COMMUNITY): Payer: Medicare HMO | Attending: Internal Medicine | Admitting: Internal Medicine

## 2023-11-22 VITALS — BP 142/72 | HR 60 | Ht 70.0 in | Wt 168.2 lb

## 2023-11-22 DIAGNOSIS — I38 Endocarditis, valve unspecified: Secondary | ICD-10-CM

## 2023-11-22 DIAGNOSIS — I5042 Chronic combined systolic (congestive) and diastolic (congestive) heart failure: Secondary | ICD-10-CM | POA: Diagnosis not present

## 2023-11-22 DIAGNOSIS — I5022 Chronic systolic (congestive) heart failure: Secondary | ICD-10-CM | POA: Diagnosis not present

## 2023-11-22 LAB — ECHOCARDIOGRAM COMPLETE
AR max vel: 2.23 cm2
AV Area VTI: 2.56 cm2
AV Area mean vel: 2.35 cm2
AV Mean grad: 10 mm[Hg]
AV Peak grad: 20.6 mm[Hg]
Ao pk vel: 2.27 m/s
Area-P 1/2: 2.21 cm2
S' Lateral: 2.7 cm

## 2023-11-22 LAB — CUP PACEART INCLINIC DEVICE CHECK
Date Time Interrogation Session: 20250115163028
Implantable Lead Connection Status: 753985
Implantable Lead Connection Status: 753985
Implantable Lead Connection Status: 753985
Implantable Lead Implant Date: 20190813
Implantable Lead Implant Date: 20190813
Implantable Lead Implant Date: 20190813
Implantable Lead Location: 753858
Implantable Lead Location: 753859
Implantable Lead Location: 753860
Implantable Lead Model: 4598
Implantable Lead Model: 5076
Implantable Lead Model: 6935
Implantable Pulse Generator Implant Date: 20190813

## 2023-11-22 NOTE — Patient Instructions (Signed)
 Medication Instructions:  Your physician recommends that you continue on your current medications as directed. Please refer to the Current Medication list given to you today.  *If you need a refill on your cardiac medications before your next appointment, please call your pharmacy*  Lab Work: None ordered.  If you have labs (blood work) drawn today and your tests are completely normal, you will receive your results only by: MyChart Message (if you have MyChart) OR A paper copy in the mail If you have any lab test that is abnormal or we need to change your treatment, we will call you to review the results.  Testing/Procedures: None ordered.  Follow-Up: At [facility], you and your health needs are our priority.  As part of our continuing mission to provide you with exceptional heart care, we have created designated Provider Care Teams.  These Care Teams include your primary Cardiologist (physician) and Advanced Practice Providers (APPs -  Physician Assistants and Nurse Practitioners) who all work together to provide you with the care you need, when you need it.  Your next appointment:   1 year(s)  The format for your next appointment:   In Person  Provider:   Manya Sells, MD{or one of the following Advanced Practice Providers on your designated Care Team:   Mertha Abrahams, [place] Bambi Lever "Jonelle Neri" [place], [place] Neda Balk, NP  Remote monitoring is used to monitor your Pacemaker/ ICD from home. This monitoring reduces the number of office visits required to check your device to one time per year.  Important Information About Sugar

## 2023-11-22 NOTE — Progress Notes (Signed)
 HPI Kenneth Mills returns today for followup. He is a pleasant 73 yo man with a h/o chronic systolic heart failure, s/p biv ICD, LBBB, and HTN. He has a h/o AS and underwent TAVR 4 years ago. He has a h/o tobacco abuse.   No Known Allergies   Current Outpatient Medications  Medication Sig Dispense Refill   amoxicillin  (AMOXIL ) 500 MG tablet Take 4 tablets (2000 mg) 30-60 minutes prior to dental procedures 4 tablet 1   aspirin  EC 81 MG tablet 1 tablet     atorvastatin  (LIPITOR ) 20 MG tablet TAKE 1 TABLET ONE TIME DAILY AT 6PM 90 tablet 3   clonazePAM  (KLONOPIN ) 0.5 MG tablet Take 0.5 mg by mouth daily as needed for anxiety.      losartan  (COZAAR ) 50 MG tablet Take 1 tablet (50 mg total) by mouth daily. 90 tablet 3   MELATONIN GUMMIES PO Take by mouth. 2 gummies nightly     metoprolol  succinate (TOPROL -XL) 25 MG 24 hr tablet TAKE 1 TABLET EVERY DAY 90 tablet 3   Multiple Vitamins-Minerals (MULTIVITAMIN ADULT) CHEW Chew 1 each by mouth daily.     No current facility-administered medications for this visit.     Past Medical History:  Diagnosis Date   AICD (automatic cardioverter/defibrillator) present 06/19/2018   Anxiety    Aortic stenosis, severe    a. suspect low flow, low gradient severe AS.    Arthritis    Chronic kidney disease    COPD (chronic obstructive pulmonary disease) (HCC)    Coronary artery disease    Family history of adverse reaction to anesthesia    /son "passed out multiple times after anesthesia"   Frequency of urination    Heart murmur    "not since valve was replaced" (06/19/2018)   High cholesterol    Hypertension    IBD (inflammatory bowel disease)    a. s/p partial colectomy in 2009   Incidental pulmonary nodule, > 3mm and < 8mm 09/07/2017   Several small nodules in left lung   LBBB (left bundle branch block)    NICM (nonischemic cardiomyopathy) (HCC)    a. 08/2014: EF 15% suspect to be endstage CM 2/2 severe AS   Pneumonia 08/2017   S/P aortic  valve replacement with bioprosthetic valve 10/18/2017   25 mm [facility] Ease stented bovine pericardial tissue valve   Squamous carcinoma    ight ear   Tobacco abuse     ROS:   All systems reviewed and negative except as noted in the HPI.   Past Surgical History:  Procedure Laterality Date   AORTIC VALVE REPLACEMENT N/A 10/18/2017   Procedure: AORTIC VALVE REPLACEMENT;  Surgeon: Gardenia Jump, MD;  Location: [facility] OR;  Service: Open Heart Surgery;  Laterality: N/A;   BIV ICD INSERTION CRT-D N/A 06/19/2018   Procedure: BIV ICD INSERTION CRT-D;  Surgeon: Tammie Fall, MD;  Location: [facility] INVASIVE CV LAB;  Service: Cardiovascular;  Laterality: N/A;   CARDIAC VALVE REPLACEMENT     COLECTOMY  2009   bowel resection, for blockage   INGUINAL HERNIA REPAIR Left 1965   INGUINAL HERNIA REPAIR Right 08/27/2018   Procedure: RIGHT INGUINAL HERNIA REPAIR WITH MESH;  Surgeon: Caralyn Chandler, MD;  Location: [facility] OR;  Service: General;  Laterality: Right;   INSERTION OF MESH Right 08/27/2018   Procedure: INSERTION OF MESH;  Surgeon: Caralyn Chandler, MD;  Location: [facility] OR;  Service: General;  Laterality: Right;   IR  FLUORO GUIDE CV MIDLINE PICC LEFT  09/04/2017   RIGHT/LEFT HEART CATH AND CORONARY ANGIOGRAPHY N/A 09/06/2017   Procedure: RIGHT/LEFT HEART CATH AND CORONARY ANGIOGRAPHY;  Surgeon: Mardell Shade, MD;  Location: [facility] INVASIVE CV LAB;  Service: Cardiovascular;  Laterality: N/A;   SQUAMOUS CELL CARCINOMA EXCISION Right    "ear"   STERNOTOMY  10/18/2017   Procedure: STERNOTOMY;  Surgeon: Gardenia Jump, MD;  Location: [facility] OR;  Service: Open Heart Surgery;;   TEE WITHOUT CARDIOVERSION N/A 10/18/2017   Procedure: TRANSESOPHAGEAL ECHOCARDIOGRAM (TEE);  Surgeon: Gardenia Jump, MD;  Location: [facility] OR;  Service: Open Heart Surgery;  Laterality: N/A;     Family History  Problem Relation Age of Onset   Hypertension Father    Heart attack Father    Lung cancer Brother      Social  History   Socioeconomic History   Marital status: Divorced    Spouse name: Not on file   Number of children: Not on file   Years of education: Not on file   Highest education level: Not on file  Occupational History   Not on file  Tobacco Use   Smoking status: Some Days    Current packs/day: 0.50    Average packs/day: 0.5 packs/day for 59.0 years (29.5 ttl pk-yrs)    Types: Cigarettes    Start date: 1966   Smokeless tobacco: Former    Types: Engineer, drilling   Vaping status: Former  Substance and Sexual Activity   Alcohol use: Yes    Comment: 06/19/2018 "couple beers/year"   Drug use: Not Currently    Types: Marijuana   Sexual activity: Not on file  Other Topics Concern   Not on file  Social History Narrative   Not on file   Social Drivers of Health   Financial Resource Strain: Not on file  Food Insecurity: Not on file  Transportation Needs: Not on file  Physical Activity: Not on file  Stress: Not on file  Social Connections: Not on file  Intimate Partner Violence: Not on file     BP (!) 142/72 (BP Location: Right Arm, Patient Position: Sitting, Cuff Size: Normal)   Pulse 60   Ht 5\' 10"  (1.778 m)   Wt 168 lb 3.2 oz (76.3 kg)   SpO2 95%   BMI 24.13 kg/m   Physical Exam:  Well appearing 73 yo man, NAD HEENT: Unremarkable Neck:  No JVD, no thyromegally Lymphatics:  No adenopathy Back:  No CVA tenderness Lungs:  Clear with no wheezes HEART:  Regular rate rhythm, no murmurs, no rubs, no clicks Abd:  soft, positive bowel sounds, no organomegally, no rebound, no guarding Ext:  2 plus pulses, no edema, no cyanosis, no clubbing Skin:  No rashes no nodules Neuro:  CN II through XII intact, motor grossly intact  DEVICE  Normal device function.  See PaceArt for details.   Assess/Plan:  Chronic systolic heart failure - his symptoms are class 2 under the direction of Dr. Veto Gowda. He is on maximal guideline directed medical therapy. He will continue his drugs. Of note  his EF improved to normal after CRTD and GDMT. 2. Tobacco abuse - he is encouraged to stop smoking. 3. CAD - he denies anginal symptoms. He has non-obstructive disease. 4. ICD - his medtronic Biv ICD has been reprogrammed to maximize battery longevity. He is about a year from ERI and we will likely down grade him to a Biv PPM if his EF remains elevated.  Kenneth Brand Poppi Scantling,MD

## 2023-11-28 DIAGNOSIS — I5022 Chronic systolic (congestive) heart failure: Secondary | ICD-10-CM | POA: Diagnosis not present

## 2023-11-28 DIAGNOSIS — E1159 Type 2 diabetes mellitus with other circulatory complications: Secondary | ICD-10-CM | POA: Diagnosis not present

## 2023-11-28 DIAGNOSIS — E785 Hyperlipidemia, unspecified: Secondary | ICD-10-CM | POA: Diagnosis not present

## 2023-11-28 DIAGNOSIS — I10 Essential (primary) hypertension: Secondary | ICD-10-CM | POA: Diagnosis not present

## 2023-12-08 NOTE — Addendum Note (Signed)
Addended by: Geralyn Flash D on: 12/08/2023 10:16 AM   Modules accepted: Orders

## 2023-12-08 NOTE — Progress Notes (Signed)
 Remote ICD transmission.

## 2023-12-27 DIAGNOSIS — E1159 Type 2 diabetes mellitus with other circulatory complications: Secondary | ICD-10-CM | POA: Diagnosis not present

## 2023-12-27 DIAGNOSIS — I10 Essential (primary) hypertension: Secondary | ICD-10-CM | POA: Diagnosis not present

## 2023-12-27 DIAGNOSIS — E785 Hyperlipidemia, unspecified: Secondary | ICD-10-CM | POA: Diagnosis not present

## 2024-01-19 DIAGNOSIS — L219 Seborrheic dermatitis, unspecified: Secondary | ICD-10-CM | POA: Diagnosis not present

## 2024-01-19 DIAGNOSIS — L814 Other melanin hyperpigmentation: Secondary | ICD-10-CM | POA: Diagnosis not present

## 2024-01-19 DIAGNOSIS — C44529 Squamous cell carcinoma of skin of other part of trunk: Secondary | ICD-10-CM | POA: Diagnosis not present

## 2024-01-19 DIAGNOSIS — L570 Actinic keratosis: Secondary | ICD-10-CM | POA: Diagnosis not present

## 2024-01-19 DIAGNOSIS — D225 Melanocytic nevi of trunk: Secondary | ICD-10-CM | POA: Diagnosis not present

## 2024-01-19 DIAGNOSIS — L812 Freckles: Secondary | ICD-10-CM | POA: Diagnosis not present

## 2024-01-29 ENCOUNTER — Ambulatory Visit (INDEPENDENT_AMBULATORY_CARE_PROVIDER_SITE_OTHER): Payer: Medicare HMO

## 2024-01-29 DIAGNOSIS — I428 Other cardiomyopathies: Secondary | ICD-10-CM

## 2024-01-29 DIAGNOSIS — I5022 Chronic systolic (congestive) heart failure: Secondary | ICD-10-CM | POA: Diagnosis not present

## 2024-01-30 LAB — CUP PACEART REMOTE DEVICE CHECK
Battery Remaining Longevity: 13 mo
Battery Voltage: 2.83 V
Brady Statistic AP VP Percent: 25.42 %
Brady Statistic AP VS Percent: 0.03 %
Brady Statistic AS VP Percent: 74.5 %
Brady Statistic AS VS Percent: 0.05 %
Brady Statistic RA Percent Paced: 25.24 %
Brady Statistic RV Percent Paced: 98.94 %
Date Time Interrogation Session: 20250324022827
HighPow Impedance: 81 Ohm
Implantable Lead Connection Status: 753985
Implantable Lead Connection Status: 753985
Implantable Lead Connection Status: 753985
Implantable Lead Implant Date: 20190813
Implantable Lead Implant Date: 20190813
Implantable Lead Implant Date: 20190813
Implantable Lead Location: 753858
Implantable Lead Location: 753859
Implantable Lead Location: 753860
Implantable Lead Model: 4598
Implantable Lead Model: 5076
Implantable Lead Model: 6935
Implantable Pulse Generator Implant Date: 20190813
Lead Channel Impedance Value: 180.5 Ohm
Lead Channel Impedance Value: 193.707
Lead Channel Impedance Value: 205.114
Lead Channel Impedance Value: 205.114
Lead Channel Impedance Value: 222.34 Ohm
Lead Channel Impedance Value: 361 Ohm
Lead Channel Impedance Value: 361 Ohm
Lead Channel Impedance Value: 418 Ohm
Lead Channel Impedance Value: 418 Ohm
Lead Channel Impedance Value: 456 Ohm
Lead Channel Impedance Value: 456 Ohm
Lead Channel Impedance Value: 475 Ohm
Lead Channel Impedance Value: 551 Ohm
Lead Channel Impedance Value: 646 Ohm
Lead Channel Impedance Value: 665 Ohm
Lead Channel Impedance Value: 722 Ohm
Lead Channel Impedance Value: 760 Ohm
Lead Channel Impedance Value: 779 Ohm
Lead Channel Pacing Threshold Amplitude: 0.5 V
Lead Channel Pacing Threshold Amplitude: 0.75 V
Lead Channel Pacing Threshold Amplitude: 2.75 V
Lead Channel Pacing Threshold Pulse Width: 0.4 ms
Lead Channel Pacing Threshold Pulse Width: 0.4 ms
Lead Channel Pacing Threshold Pulse Width: 0.4 ms
Lead Channel Sensing Intrinsic Amplitude: 1.125 mV
Lead Channel Sensing Intrinsic Amplitude: 1.125 mV
Lead Channel Sensing Intrinsic Amplitude: 10.5 mV
Lead Channel Sensing Intrinsic Amplitude: 10.5 mV
Lead Channel Setting Pacing Amplitude: 1.5 V
Lead Channel Setting Pacing Amplitude: 2.5 V
Lead Channel Setting Pacing Amplitude: 3.25 V
Lead Channel Setting Pacing Pulse Width: 0.4 ms
Lead Channel Setting Pacing Pulse Width: 0.4 ms
Lead Channel Setting Sensing Sensitivity: 0.3 mV
Zone Setting Status: 755011
Zone Setting Status: 755011

## 2024-02-01 DIAGNOSIS — D045 Carcinoma in situ of skin of trunk: Secondary | ICD-10-CM | POA: Diagnosis not present

## 2024-03-19 NOTE — Progress Notes (Signed)
 Remote ICD transmission.

## 2024-03-19 NOTE — Addendum Note (Signed)
 Addended by: Edra Govern D on: 03/19/2024 09:12 AM   Modules accepted: Orders

## 2024-04-29 ENCOUNTER — Ambulatory Visit (INDEPENDENT_AMBULATORY_CARE_PROVIDER_SITE_OTHER): Payer: Medicare HMO

## 2024-04-29 DIAGNOSIS — I428 Other cardiomyopathies: Secondary | ICD-10-CM

## 2024-04-29 DIAGNOSIS — I5022 Chronic systolic (congestive) heart failure: Secondary | ICD-10-CM | POA: Diagnosis not present

## 2024-04-30 LAB — CUP PACEART REMOTE DEVICE CHECK
Battery Remaining Longevity: 10 mo
Battery Voltage: 2.81 V
Brady Statistic AP VP Percent: 15.88 %
Brady Statistic AP VS Percent: 0.02 %
Brady Statistic AS VP Percent: 84.1 %
Brady Statistic AS VS Percent: 0 %
Brady Statistic RA Percent Paced: 15.89 %
Brady Statistic RV Percent Paced: 99.92 %
Date Time Interrogation Session: 20250623022604
HighPow Impedance: 74 Ohm
Implantable Lead Connection Status: 753985
Implantable Lead Connection Status: 753985
Implantable Lead Connection Status: 753985
Implantable Lead Implant Date: 20190813
Implantable Lead Implant Date: 20190813
Implantable Lead Implant Date: 20190813
Implantable Lead Location: 753858
Implantable Lead Location: 753859
Implantable Lead Location: 753860
Implantable Lead Model: 4598
Implantable Lead Model: 5076
Implantable Lead Model: 6935
Implantable Pulse Generator Implant Date: 20190813
Lead Channel Impedance Value: 189.525
Lead Channel Impedance Value: 193.707
Lead Channel Impedance Value: 201.488
Lead Channel Impedance Value: 212.8 Ohm
Lead Channel Impedance Value: 218.087
Lead Channel Impedance Value: 361 Ohm
Lead Channel Impedance Value: 361 Ohm
Lead Channel Impedance Value: 399 Ohm
Lead Channel Impedance Value: 418 Ohm
Lead Channel Impedance Value: 418 Ohm
Lead Channel Impedance Value: 456 Ohm
Lead Channel Impedance Value: 456 Ohm
Lead Channel Impedance Value: 456 Ohm
Lead Channel Impedance Value: 646 Ohm
Lead Channel Impedance Value: 703 Ohm
Lead Channel Impedance Value: 722 Ohm
Lead Channel Impedance Value: 722 Ohm
Lead Channel Impedance Value: 760 Ohm
Lead Channel Pacing Threshold Amplitude: 0.5 V
Lead Channel Pacing Threshold Amplitude: 0.75 V
Lead Channel Pacing Threshold Amplitude: 2.5 V
Lead Channel Pacing Threshold Pulse Width: 0.4 ms
Lead Channel Pacing Threshold Pulse Width: 0.4 ms
Lead Channel Pacing Threshold Pulse Width: 0.4 ms
Lead Channel Sensing Intrinsic Amplitude: 1.75 mV
Lead Channel Sensing Intrinsic Amplitude: 1.75 mV
Lead Channel Sensing Intrinsic Amplitude: 10.5 mV
Lead Channel Sensing Intrinsic Amplitude: 10.5 mV
Lead Channel Setting Pacing Amplitude: 1.5 V
Lead Channel Setting Pacing Amplitude: 2.5 V
Lead Channel Setting Pacing Amplitude: 3 V
Lead Channel Setting Pacing Pulse Width: 0.4 ms
Lead Channel Setting Pacing Pulse Width: 0.4 ms
Lead Channel Setting Sensing Sensitivity: 0.3 mV
Zone Setting Status: 755011
Zone Setting Status: 755011

## 2024-05-05 ENCOUNTER — Ambulatory Visit: Payer: Self-pay | Admitting: Internal Medicine

## 2024-05-30 DIAGNOSIS — Z23 Encounter for immunization: Secondary | ICD-10-CM | POA: Diagnosis not present

## 2024-05-30 DIAGNOSIS — Z6823 Body mass index (BMI) 23.0-23.9, adult: Secondary | ICD-10-CM | POA: Diagnosis not present

## 2024-05-30 DIAGNOSIS — Z125 Encounter for screening for malignant neoplasm of prostate: Secondary | ICD-10-CM | POA: Diagnosis not present

## 2024-05-30 DIAGNOSIS — Z0000 Encounter for general adult medical examination without abnormal findings: Secondary | ICD-10-CM | POA: Diagnosis not present

## 2024-05-30 DIAGNOSIS — E785 Hyperlipidemia, unspecified: Secondary | ICD-10-CM | POA: Diagnosis not present

## 2024-05-30 DIAGNOSIS — E1159 Type 2 diabetes mellitus with other circulatory complications: Secondary | ICD-10-CM | POA: Diagnosis not present

## 2024-05-30 DIAGNOSIS — I10 Essential (primary) hypertension: Secondary | ICD-10-CM | POA: Diagnosis not present

## 2024-05-30 DIAGNOSIS — J449 Chronic obstructive pulmonary disease, unspecified: Secondary | ICD-10-CM | POA: Diagnosis not present

## 2024-05-30 DIAGNOSIS — I5022 Chronic systolic (congestive) heart failure: Secondary | ICD-10-CM | POA: Diagnosis not present

## 2024-06-03 DIAGNOSIS — Z1211 Encounter for screening for malignant neoplasm of colon: Secondary | ICD-10-CM | POA: Diagnosis not present

## 2024-06-03 NOTE — Addendum Note (Signed)
 Addended by: TAWNI DRILLING D on: 06/03/2024 04:36 PM   Modules accepted: Orders

## 2024-06-03 NOTE — Progress Notes (Signed)
 Remote ICD transmission.

## 2024-06-11 DIAGNOSIS — D045 Carcinoma in situ of skin of trunk: Secondary | ICD-10-CM | POA: Diagnosis not present

## 2024-06-11 DIAGNOSIS — L820 Inflamed seborrheic keratosis: Secondary | ICD-10-CM | POA: Diagnosis not present

## 2024-06-11 DIAGNOSIS — L814 Other melanin hyperpigmentation: Secondary | ICD-10-CM | POA: Diagnosis not present

## 2024-06-11 DIAGNOSIS — L570 Actinic keratosis: Secondary | ICD-10-CM | POA: Diagnosis not present

## 2024-07-29 ENCOUNTER — Ambulatory Visit (INDEPENDENT_AMBULATORY_CARE_PROVIDER_SITE_OTHER): Payer: Medicare HMO

## 2024-07-29 DIAGNOSIS — I5022 Chronic systolic (congestive) heart failure: Secondary | ICD-10-CM | POA: Diagnosis not present

## 2024-07-30 LAB — CUP PACEART REMOTE DEVICE CHECK
Battery Remaining Longevity: 7 mo
Battery Voltage: 2.84 V
Brady Statistic AP VP Percent: 15.62 %
Brady Statistic AP VS Percent: 0.01 %
Brady Statistic AS VP Percent: 84.36 %
Brady Statistic AS VS Percent: 0 %
Brady Statistic RA Percent Paced: 15.63 %
Brady Statistic RV Percent Paced: 99.95 %
Date Time Interrogation Session: 20250922022723
HighPow Impedance: 79 Ohm
Implantable Lead Connection Status: 753985
Implantable Lead Connection Status: 753985
Implantable Lead Connection Status: 753985
Implantable Lead Implant Date: 20190813
Implantable Lead Implant Date: 20190813
Implantable Lead Implant Date: 20190813
Implantable Lead Location: 753858
Implantable Lead Location: 753859
Implantable Lead Location: 753860
Implantable Lead Model: 4598
Implantable Lead Model: 5076
Implantable Lead Model: 6935
Implantable Pulse Generator Implant Date: 20190813
Lead Channel Impedance Value: 199.5 Ohm
Lead Channel Impedance Value: 204.14 Ohm
Lead Channel Impedance Value: 216.848
Lead Channel Impedance Value: 216.848
Lead Channel Impedance Value: 222.34 Ohm
Lead Channel Impedance Value: 361 Ohm
Lead Channel Impedance Value: 399 Ohm
Lead Channel Impedance Value: 399 Ohm
Lead Channel Impedance Value: 418 Ohm
Lead Channel Impedance Value: 418 Ohm
Lead Channel Impedance Value: 456 Ohm
Lead Channel Impedance Value: 475 Ohm
Lead Channel Impedance Value: 475 Ohm
Lead Channel Impedance Value: 665 Ohm
Lead Channel Impedance Value: 703 Ohm
Lead Channel Impedance Value: 760 Ohm
Lead Channel Impedance Value: 760 Ohm
Lead Channel Impedance Value: 760 Ohm
Lead Channel Pacing Threshold Amplitude: 0.375 V
Lead Channel Pacing Threshold Amplitude: 0.875 V
Lead Channel Pacing Threshold Amplitude: 2.5 V
Lead Channel Pacing Threshold Pulse Width: 0.4 ms
Lead Channel Pacing Threshold Pulse Width: 0.4 ms
Lead Channel Pacing Threshold Pulse Width: 0.4 ms
Lead Channel Sensing Intrinsic Amplitude: 2.125 mV
Lead Channel Sensing Intrinsic Amplitude: 2.125 mV
Lead Channel Sensing Intrinsic Amplitude: 5.25 mV
Lead Channel Sensing Intrinsic Amplitude: 5.25 mV
Lead Channel Setting Pacing Amplitude: 1.5 V
Lead Channel Setting Pacing Amplitude: 2.5 V
Lead Channel Setting Pacing Amplitude: 3.25 V
Lead Channel Setting Pacing Pulse Width: 0.4 ms
Lead Channel Setting Pacing Pulse Width: 0.4 ms
Lead Channel Setting Sensing Sensitivity: 0.3 mV
Zone Setting Status: 755011
Zone Setting Status: 755011

## 2024-07-31 NOTE — Progress Notes (Signed)
Remote ICD Transmission.

## 2024-08-04 ENCOUNTER — Ambulatory Visit: Payer: Self-pay | Admitting: Internal Medicine

## 2024-09-20 DIAGNOSIS — Z961 Presence of intraocular lens: Secondary | ICD-10-CM | POA: Diagnosis not present

## 2024-09-20 DIAGNOSIS — H52223 Regular astigmatism, bilateral: Secondary | ICD-10-CM | POA: Diagnosis not present

## 2024-10-28 ENCOUNTER — Ambulatory Visit: Payer: Medicare HMO

## 2024-10-28 DIAGNOSIS — I5022 Chronic systolic (congestive) heart failure: Secondary | ICD-10-CM

## 2024-10-29 LAB — CUP PACEART REMOTE DEVICE CHECK
Battery Remaining Longevity: 4 mo
Battery Voltage: 2.81 V
Brady Statistic AP VP Percent: 8.36 %
Brady Statistic AP VS Percent: 0.01 %
Brady Statistic AS VP Percent: 91.61 %
Brady Statistic AS VS Percent: 0.01 %
Brady Statistic RA Percent Paced: 8.38 %
Brady Statistic RV Percent Paced: 99.93 %
Date Time Interrogation Session: 20251222052826
HighPow Impedance: 73 Ohm
Implantable Lead Connection Status: 753985
Implantable Lead Connection Status: 753985
Implantable Lead Connection Status: 753985
Implantable Lead Implant Date: 20190813
Implantable Lead Implant Date: 20190813
Implantable Lead Implant Date: 20190813
Implantable Lead Location: 753858
Implantable Lead Location: 753859
Implantable Lead Location: 753860
Implantable Lead Model: 4598
Implantable Lead Model: 5076
Implantable Lead Model: 6935
Implantable Pulse Generator Implant Date: 20190813
Lead Channel Impedance Value: 175.622
Lead Channel Impedance Value: 188.1 Ohm
Lead Channel Impedance Value: 195.429
Lead Channel Impedance Value: 201.488
Lead Channel Impedance Value: 218.087
Lead Channel Impedance Value: 342 Ohm
Lead Channel Impedance Value: 361 Ohm
Lead Channel Impedance Value: 361 Ohm
Lead Channel Impedance Value: 399 Ohm
Lead Channel Impedance Value: 418 Ohm
Lead Channel Impedance Value: 418 Ohm
Lead Channel Impedance Value: 456 Ohm
Lead Channel Impedance Value: 475 Ohm
Lead Channel Impedance Value: 608 Ohm
Lead Channel Impedance Value: 665 Ohm
Lead Channel Impedance Value: 703 Ohm
Lead Channel Impedance Value: 703 Ohm
Lead Channel Impedance Value: 760 Ohm
Lead Channel Pacing Threshold Amplitude: 0.375 V
Lead Channel Pacing Threshold Amplitude: 0.75 V
Lead Channel Pacing Threshold Amplitude: 2.125 V
Lead Channel Pacing Threshold Pulse Width: 0.4 ms
Lead Channel Pacing Threshold Pulse Width: 0.4 ms
Lead Channel Pacing Threshold Pulse Width: 0.4 ms
Lead Channel Sensing Intrinsic Amplitude: 1.5 mV
Lead Channel Sensing Intrinsic Amplitude: 1.5 mV
Lead Channel Sensing Intrinsic Amplitude: 8.375 mV
Lead Channel Sensing Intrinsic Amplitude: 8.375 mV
Lead Channel Setting Pacing Amplitude: 1.5 V
Lead Channel Setting Pacing Amplitude: 2.5 V
Lead Channel Setting Pacing Amplitude: 2.75 V
Lead Channel Setting Pacing Pulse Width: 0.4 ms
Lead Channel Setting Pacing Pulse Width: 0.4 ms
Lead Channel Setting Sensing Sensitivity: 0.3 mV
Zone Setting Status: 755011
Zone Setting Status: 755011

## 2024-10-30 NOTE — Progress Notes (Signed)
 Remote ICD Transmission

## 2024-11-03 ENCOUNTER — Ambulatory Visit: Payer: Self-pay | Admitting: Internal Medicine

## 2024-11-28 ENCOUNTER — Ambulatory Visit: Attending: *Deleted

## 2024-11-28 LAB — CUP PACEART REMOTE DEVICE CHECK
Battery Remaining Longevity: 2 mo
Battery Voltage: 2.79 V
Brady Statistic AP VP Percent: 5.94 %
Brady Statistic AP VS Percent: 0.01 %
Brady Statistic AS VP Percent: 94.03 %
Brady Statistic AS VS Percent: 0.01 %
Brady Statistic RA Percent Paced: 5.95 %
Brady Statistic RV Percent Paced: 99.94 %
Date Time Interrogation Session: 20260122043723
HighPow Impedance: 72 Ohm
Implantable Lead Connection Status: 753985
Implantable Lead Connection Status: 753985
Implantable Lead Connection Status: 753985
Implantable Lead Implant Date: 20190813
Implantable Lead Implant Date: 20190813
Implantable Lead Implant Date: 20190813
Implantable Lead Location: 753858
Implantable Lead Location: 753859
Implantable Lead Location: 753860
Implantable Lead Model: 4598
Implantable Lead Model: 5076
Implantable Lead Model: 6935
Implantable Pulse Generator Implant Date: 20190813
Lead Channel Impedance Value: 165.029
Lead Channel Impedance Value: 172.541
Lead Channel Impedance Value: 182.4 Ohm
Lead Channel Impedance Value: 201.488
Lead Channel Impedance Value: 212.8 Ohm
Lead Channel Impedance Value: 304 Ohm
Lead Channel Impedance Value: 361 Ohm
Lead Channel Impedance Value: 361 Ohm
Lead Channel Impedance Value: 399 Ohm
Lead Channel Impedance Value: 399 Ohm
Lead Channel Impedance Value: 456 Ohm
Lead Channel Impedance Value: 456 Ohm
Lead Channel Impedance Value: 475 Ohm
Lead Channel Impedance Value: 551 Ohm
Lead Channel Impedance Value: 608 Ohm
Lead Channel Impedance Value: 665 Ohm
Lead Channel Impedance Value: 703 Ohm
Lead Channel Impedance Value: 722 Ohm
Lead Channel Pacing Threshold Amplitude: 0.5 V
Lead Channel Pacing Threshold Amplitude: 0.875 V
Lead Channel Pacing Threshold Amplitude: 2.5 V
Lead Channel Pacing Threshold Pulse Width: 0.4 ms
Lead Channel Pacing Threshold Pulse Width: 0.4 ms
Lead Channel Pacing Threshold Pulse Width: 0.4 ms
Lead Channel Sensing Intrinsic Amplitude: 1.25 mV
Lead Channel Sensing Intrinsic Amplitude: 1.25 mV
Lead Channel Sensing Intrinsic Amplitude: 8.125 mV
Lead Channel Sensing Intrinsic Amplitude: 8.125 mV
Lead Channel Setting Pacing Amplitude: 1.5 V
Lead Channel Setting Pacing Amplitude: 2.5 V
Lead Channel Setting Pacing Amplitude: 3.25 V
Lead Channel Setting Pacing Pulse Width: 0.4 ms
Lead Channel Setting Pacing Pulse Width: 0.4 ms
Lead Channel Setting Sensing Sensitivity: 0.3 mV
Zone Setting Status: 755011
Zone Setting Status: 755011

## 2024-12-09 ENCOUNTER — Emergency Department (HOSPITAL_BASED_OUTPATIENT_CLINIC_OR_DEPARTMENT_OTHER)

## 2024-12-09 ENCOUNTER — Inpatient Hospital Stay (HOSPITAL_BASED_OUTPATIENT_CLINIC_OR_DEPARTMENT_OTHER)
Admission: EM | Admit: 2024-12-09 | Discharge: 2024-12-12 | DRG: 871 | Disposition: A | Attending: Family Medicine | Admitting: Family Medicine

## 2024-12-09 ENCOUNTER — Encounter (HOSPITAL_BASED_OUTPATIENT_CLINIC_OR_DEPARTMENT_OTHER): Payer: Self-pay

## 2024-12-09 ENCOUNTER — Other Ambulatory Visit: Payer: Self-pay

## 2024-12-09 DIAGNOSIS — I428 Other cardiomyopathies: Secondary | ICD-10-CM | POA: Diagnosis not present

## 2024-12-09 DIAGNOSIS — R0602 Shortness of breath: Secondary | ICD-10-CM

## 2024-12-09 DIAGNOSIS — A419 Sepsis, unspecified organism: Principal | ICD-10-CM | POA: Diagnosis present

## 2024-12-09 DIAGNOSIS — Z95810 Presence of automatic (implantable) cardiac defibrillator: Secondary | ICD-10-CM | POA: Diagnosis present

## 2024-12-09 DIAGNOSIS — Z66 Do not resuscitate: Secondary | ICD-10-CM | POA: Diagnosis present

## 2024-12-09 DIAGNOSIS — I2510 Atherosclerotic heart disease of native coronary artery without angina pectoris: Secondary | ICD-10-CM | POA: Diagnosis present

## 2024-12-09 DIAGNOSIS — Z953 Presence of xenogenic heart valve: Secondary | ICD-10-CM

## 2024-12-09 DIAGNOSIS — E7800 Pure hypercholesterolemia, unspecified: Secondary | ICD-10-CM | POA: Diagnosis present

## 2024-12-09 DIAGNOSIS — K589 Irritable bowel syndrome without diarrhea: Secondary | ICD-10-CM | POA: Diagnosis present

## 2024-12-09 DIAGNOSIS — I2489 Other forms of acute ischemic heart disease: Secondary | ICD-10-CM | POA: Diagnosis present

## 2024-12-09 DIAGNOSIS — Z79899 Other long term (current) drug therapy: Secondary | ICD-10-CM

## 2024-12-09 DIAGNOSIS — J069 Acute upper respiratory infection, unspecified: Secondary | ICD-10-CM | POA: Diagnosis present

## 2024-12-09 DIAGNOSIS — Z8249 Family history of ischemic heart disease and other diseases of the circulatory system: Secondary | ICD-10-CM

## 2024-12-09 DIAGNOSIS — J189 Pneumonia, unspecified organism: Secondary | ICD-10-CM | POA: Diagnosis not present

## 2024-12-09 DIAGNOSIS — E872 Acidosis, unspecified: Secondary | ICD-10-CM | POA: Diagnosis present

## 2024-12-09 DIAGNOSIS — F419 Anxiety disorder, unspecified: Secondary | ICD-10-CM | POA: Diagnosis not present

## 2024-12-09 DIAGNOSIS — I350 Nonrheumatic aortic (valve) stenosis: Secondary | ICD-10-CM | POA: Diagnosis present

## 2024-12-09 DIAGNOSIS — Z952 Presence of prosthetic heart valve: Secondary | ICD-10-CM | POA: Diagnosis not present

## 2024-12-09 DIAGNOSIS — J441 Chronic obstructive pulmonary disease with (acute) exacerbation: Secondary | ICD-10-CM | POA: Diagnosis not present

## 2024-12-09 DIAGNOSIS — I10 Essential (primary) hypertension: Secondary | ICD-10-CM | POA: Diagnosis present

## 2024-12-09 DIAGNOSIS — R058 Other specified cough: Secondary | ICD-10-CM

## 2024-12-09 DIAGNOSIS — T380X5A Adverse effect of glucocorticoids and synthetic analogues, initial encounter: Secondary | ICD-10-CM | POA: Diagnosis present

## 2024-12-09 DIAGNOSIS — I447 Left bundle-branch block, unspecified: Secondary | ICD-10-CM | POA: Diagnosis present

## 2024-12-09 DIAGNOSIS — Z9049 Acquired absence of other specified parts of digestive tract: Secondary | ICD-10-CM

## 2024-12-09 DIAGNOSIS — R5383 Other fatigue: Secondary | ICD-10-CM

## 2024-12-09 DIAGNOSIS — F17210 Nicotine dependence, cigarettes, uncomplicated: Secondary | ICD-10-CM | POA: Diagnosis present

## 2024-12-09 DIAGNOSIS — Z7982 Long term (current) use of aspirin: Secondary | ICD-10-CM

## 2024-12-09 LAB — CBC
HCT: 44.3 % (ref 39.0–52.0)
Hemoglobin: 14.6 g/dL (ref 13.0–17.0)
MCH: 29.9 pg (ref 26.0–34.0)
MCHC: 33 g/dL (ref 30.0–36.0)
MCV: 90.6 fL (ref 80.0–100.0)
Platelets: 186 10*3/uL (ref 150–400)
RBC: 4.89 MIL/uL (ref 4.22–5.81)
RDW: 13.2 % (ref 11.5–15.5)
WBC: 11.1 10*3/uL — ABNORMAL HIGH (ref 4.0–10.5)
nRBC: 0 % (ref 0.0–0.2)

## 2024-12-09 LAB — RESP PANEL BY RT-PCR (RSV, FLU A&B, COVID)  RVPGX2
Influenza A by PCR: NEGATIVE
Influenza B by PCR: NEGATIVE
Resp Syncytial Virus by PCR: NEGATIVE
SARS Coronavirus 2 by RT PCR: NEGATIVE

## 2024-12-09 LAB — URINALYSIS, W/ REFLEX TO CULTURE (INFECTION SUSPECTED)
Bilirubin Urine: NEGATIVE
Glucose, UA: NEGATIVE mg/dL
Hgb urine dipstick: NEGATIVE
Ketones, ur: NEGATIVE mg/dL
Leukocytes,Ua: NEGATIVE
Nitrite: NEGATIVE
Protein, ur: 30 mg/dL — AB
Specific Gravity, Urine: 1.025 (ref 1.005–1.030)
pH: 5.5 (ref 5.0–8.0)

## 2024-12-09 LAB — HEPATIC FUNCTION PANEL
ALT: 11 U/L (ref 0–44)
AST: 57 U/L — ABNORMAL HIGH (ref 15–41)
Albumin: 4.2 g/dL (ref 3.5–5.0)
Alkaline Phosphatase: 113 U/L (ref 38–126)
Bilirubin, Direct: 0.3 mg/dL — ABNORMAL HIGH (ref 0.0–0.2)
Indirect Bilirubin: 0.4 mg/dL (ref 0.3–0.9)
Total Bilirubin: 0.6 mg/dL (ref 0.0–1.2)
Total Protein: 7.6 g/dL (ref 6.5–8.1)

## 2024-12-09 LAB — BASIC METABOLIC PANEL WITH GFR
Anion gap: 16 — ABNORMAL HIGH (ref 5–15)
BUN: 21 mg/dL (ref 8–23)
CO2: 23 mmol/L (ref 22–32)
Calcium: 10.1 mg/dL (ref 8.9–10.3)
Chloride: 102 mmol/L (ref 98–111)
Creatinine, Ser: 0.87 mg/dL (ref 0.61–1.24)
GFR, Estimated: >60 mL/min
Glucose, Bld: 169 mg/dL — ABNORMAL HIGH (ref 70–99)
Potassium: 4.1 mmol/L (ref 3.5–5.1)
Sodium: 141 mmol/L (ref 135–145)

## 2024-12-09 LAB — TROPONIN T, HIGH SENSITIVITY
Troponin T High Sensitivity: 22 ng/L — ABNORMAL HIGH (ref 0–19)
Troponin T High Sensitivity: 22 ng/L — ABNORMAL HIGH (ref 0–19)

## 2024-12-09 LAB — PROTIME-INR
INR: 1 (ref 0.8–1.2)
Prothrombin Time: 14.2 s (ref 11.4–15.2)

## 2024-12-09 LAB — LACTIC ACID, PLASMA
Lactic Acid, Venous: 3 mmol/L (ref 0.5–1.9)
Lactic Acid, Venous: 3.3 mmol/L (ref 0.5–1.9)

## 2024-12-09 LAB — PRO BRAIN NATRIURETIC PEPTIDE: Pro Brain Natriuretic Peptide: 784 pg/mL — ABNORMAL HIGH

## 2024-12-09 LAB — STREP PNEUMONIAE URINARY ANTIGEN: Strep Pneumo Urinary Antigen: NEGATIVE

## 2024-12-09 MED ORDER — FAMOTIDINE 20 MG PO TABS
20.0000 mg | ORAL_TABLET | Freq: Once | ORAL | Status: AC
  Administered 2024-12-09: 20 mg via ORAL
  Filled 2024-12-09: qty 1

## 2024-12-09 MED ORDER — ENOXAPARIN SODIUM 40 MG/0.4ML IJ SOSY
40.0000 mg | PREFILLED_SYRINGE | INTRAMUSCULAR | Status: DC
  Administered 2024-12-10 – 2024-12-12 (×3): 40 mg via SUBCUTANEOUS
  Filled 2024-12-09 (×3): qty 0.4

## 2024-12-09 MED ORDER — CLONAZEPAM 0.5 MG PO TABS: 0.5000 mg | ORAL_TABLET | Freq: Every day | ORAL | Status: DC | PRN

## 2024-12-09 MED ORDER — SODIUM CHLORIDE 0.9 % IV SOLN: 500.0000 mg | Freq: Once | INTRAVENOUS | Status: DC

## 2024-12-09 MED ORDER — IPRATROPIUM-ALBUTEROL 0.5-2.5 (3) MG/3ML IN SOLN
3.0000 mL | Freq: Once | RESPIRATORY_TRACT | Status: AC
  Administered 2024-12-09: 3 mL via RESPIRATORY_TRACT
  Filled 2024-12-09: qty 3

## 2024-12-09 MED ORDER — SODIUM CHLORIDE 0.9 % IV SOLN
1.0000 g | INTRAVENOUS | Status: DC
  Administered 2024-12-10 – 2024-12-12 (×3): 1 g via INTRAVENOUS
  Filled 2024-12-09 (×3): qty 10

## 2024-12-09 MED ORDER — PREDNISONE 20 MG PO TABS
40.0000 mg | ORAL_TABLET | Freq: Every day | ORAL | Status: DC
  Administered 2024-12-10 – 2024-12-12 (×3): 40 mg via ORAL
  Filled 2024-12-09 (×3): qty 2

## 2024-12-09 MED ORDER — ASPIRIN 81 MG PO TBEC
81.0000 mg | DELAYED_RELEASE_TABLET | Freq: Every day | ORAL | Status: DC
  Administered 2024-12-10 – 2024-12-12 (×3): 81 mg via ORAL
  Filled 2024-12-09 (×3): qty 1

## 2024-12-09 MED ORDER — ATORVASTATIN CALCIUM 20 MG PO TABS
20.0000 mg | ORAL_TABLET | Freq: Every day | ORAL | Status: DC
  Administered 2024-12-10 – 2024-12-12 (×3): 20 mg via ORAL
  Filled 2024-12-09 (×3): qty 1

## 2024-12-09 MED ORDER — METOPROLOL SUCCINATE ER 50 MG PO TB24
25.0000 mg | ORAL_TABLET | Freq: Every day | ORAL | Status: DC
  Administered 2024-12-10 – 2024-12-12 (×3): 25 mg via ORAL
  Filled 2024-12-09 (×4): qty 1

## 2024-12-09 MED ORDER — LACTATED RINGERS IV BOLUS (SEPSIS)
1000.0000 mL | Freq: Once | INTRAVENOUS | Status: AC
  Administered 2024-12-09: 1000 mL via INTRAVENOUS

## 2024-12-09 MED ORDER — LACTATED RINGERS IV SOLN: INTRAVENOUS | Status: AC

## 2024-12-09 MED ORDER — DOXYCYCLINE HYCLATE 100 MG PO TABS
100.0000 mg | ORAL_TABLET | Freq: Two times a day (BID) | ORAL | Status: DC
  Administered 2024-12-10 – 2024-12-12 (×5): 100 mg via ORAL
  Filled 2024-12-09 (×5): qty 1

## 2024-12-09 MED ORDER — LACTATED RINGERS IV BOLUS (SEPSIS)
500.0000 mL | Freq: Once | INTRAVENOUS | Status: AC
  Administered 2024-12-09: 500 mL via INTRAVENOUS

## 2024-12-09 MED ORDER — LORAZEPAM 1 MG PO TABS
1.0000 mg | ORAL_TABLET | Freq: Once | ORAL | Status: AC
  Administered 2024-12-09: 1 mg via ORAL
  Filled 2024-12-09: qty 1

## 2024-12-09 MED ORDER — METHYLPREDNISOLONE SODIUM SUCC 125 MG IJ SOLR
80.0000 mg | Freq: Once | INTRAMUSCULAR | Status: AC
  Administered 2024-12-10: 80 mg via INTRAVENOUS
  Filled 2024-12-09: qty 2

## 2024-12-09 MED ORDER — SODIUM CHLORIDE 0.9 % IV SOLN
100.0000 mg | Freq: Once | INTRAVENOUS | Status: AC
  Administered 2024-12-09: 100 mg via INTRAVENOUS
  Filled 2024-12-09: qty 100

## 2024-12-09 MED ORDER — DM-GUAIFENESIN ER 30-600 MG PO TB12
1.0000 | ORAL_TABLET | Freq: Two times a day (BID) | ORAL | Status: DC
  Administered 2024-12-10 – 2024-12-12 (×5): 1 via ORAL
  Filled 2024-12-09 (×6): qty 1

## 2024-12-09 MED ORDER — HYDRALAZINE HCL 20 MG/ML IJ SOLN
10.0000 mg | Freq: Three times a day (TID) | INTRAMUSCULAR | Status: DC | PRN
  Administered 2024-12-10: 10 mg via INTRAVENOUS
  Filled 2024-12-09: qty 1

## 2024-12-09 MED ORDER — SODIUM CHLORIDE 0.9 % IV SOLN
1.0000 g | Freq: Once | INTRAVENOUS | Status: AC
  Administered 2024-12-09: 1 g via INTRAVENOUS
  Filled 2024-12-09: qty 10

## 2024-12-09 MED ORDER — IPRATROPIUM-ALBUTEROL 0.5-2.5 (3) MG/3ML IN SOLN
3.0000 mL | Freq: Four times a day (QID) | RESPIRATORY_TRACT | Status: DC | PRN
  Administered 2024-12-10: 3 mL via RESPIRATORY_TRACT
  Filled 2024-12-09: qty 3

## 2024-12-09 MED ORDER — LOSARTAN POTASSIUM 50 MG PO TABS
50.0000 mg | ORAL_TABLET | Freq: Every day | ORAL | Status: DC
  Administered 2024-12-10 – 2024-12-12 (×3): 50 mg via ORAL
  Filled 2024-12-09 (×3): qty 1

## 2024-12-09 NOTE — Sepsis Progress Note (Signed)
 Sepsis protocol monitored by eLink

## 2024-12-09 NOTE — ED Notes (Signed)
 Per pt and family when he had a cough this bad  and was sob before he was septic and got intubated, pt has been sick for a few weeks per  pts daughter resp in to eval and give breathing tx  pt has  wet congested cough

## 2024-12-09 NOTE — ED Triage Notes (Signed)
 Reports Elevated BP and HR for 2 days [facility] w exertion for 3 days.  BIL hip pain for 2 weeks.   No obvious signs of respiratory distress in triage

## 2024-12-10 ENCOUNTER — Encounter (HOSPITAL_COMMUNITY): Payer: Self-pay | Admitting: Student

## 2024-12-10 DIAGNOSIS — A419 Sepsis, unspecified organism: Secondary | ICD-10-CM | POA: Diagnosis not present

## 2024-12-10 LAB — CBC
HCT: 40.7 % (ref 39.0–52.0)
Hemoglobin: 13.1 g/dL (ref 13.0–17.0)
MCH: 29.9 pg (ref 26.0–34.0)
MCHC: 32.2 g/dL (ref 30.0–36.0)
MCV: 92.9 fL (ref 80.0–100.0)
Platelets: 171 10*3/uL (ref 150–400)
RBC: 4.38 MIL/uL (ref 4.22–5.81)
RDW: 13.1 % (ref 11.5–15.5)
WBC: 10.3 10*3/uL (ref 4.0–10.5)
nRBC: 0 % (ref 0.0–0.2)

## 2024-12-10 LAB — RESPIRATORY PANEL BY PCR

## 2024-12-10 LAB — COMPREHENSIVE METABOLIC PANEL WITH GFR
ALT: 10 U/L (ref 0–44)
AST: 62 U/L — ABNORMAL HIGH (ref 15–41)
Albumin: 3.4 g/dL — ABNORMAL LOW (ref 3.5–5.0)
Alkaline Phosphatase: 96 U/L (ref 38–126)
Anion gap: 16 — ABNORMAL HIGH (ref 5–15)
BUN: 19 mg/dL (ref 8–23)
CO2: 20 mmol/L — ABNORMAL LOW (ref 22–32)
Calcium: 9.5 mg/dL (ref 8.9–10.3)
Chloride: 101 mmol/L (ref 98–111)
Creatinine, Ser: 0.73 mg/dL (ref 0.61–1.24)
GFR, Estimated: >60 mL/min
Glucose, Bld: 194 mg/dL — ABNORMAL HIGH (ref 70–99)
Potassium: 4 mmol/L (ref 3.5–5.1)
Sodium: 137 mmol/L (ref 135–145)
Total Bilirubin: 0.7 mg/dL (ref 0.0–1.2)
Total Protein: 6.7 g/dL (ref 6.5–8.1)

## 2024-12-10 LAB — LACTIC ACID, PLASMA
Lactic Acid, Venous: 2.3 mmol/L (ref 0.5–1.9)
Lactic Acid, Venous: 2.3 mmol/L (ref 0.5–1.9)

## 2024-12-10 LAB — MRSA NEXT GEN BY PCR, NASAL: MRSA by PCR Next Gen: NOT DETECTED

## 2024-12-10 LAB — PROCALCITONIN: Procalcitonin: 0.25 ng/mL

## 2024-12-10 MED ORDER — ADULT MULTIVITAMIN W/MINERALS CH
1.0000 | ORAL_TABLET | Freq: Every day | ORAL | Status: DC
  Administered 2024-12-10 – 2024-12-12 (×3): 1 via ORAL
  Filled 2024-12-10 (×3): qty 1

## 2024-12-10 NOTE — Progress Notes (Signed)
 Mobility Specialist - Progress Note  (RA) Pre-mobility: 109 bpm HR, 91% SpO2 During mobility: 130 bpm HR, 90-92% SpO2 Post-mobility: 111 bpm HR, 93% SPO2   12/10/24 1141  Mobility  Activity Ambulated with assistance  Level of Assistance Contact guard assist, steadying assist  Assistive Device Front wheel walker  Distance Ambulated (ft) 500 ft  Range of Motion/Exercises Active  Activity Response Tolerated well  Mobility visit 1 Mobility  Mobility Specialist Start Time (ACUTE ONLY) 1125  Mobility Specialist Stop Time (ACUTE ONLY) 1141  Mobility Specialist Time Calculation (min) (ACUTE ONLY) 16 min   Pt was found in bed and agreeable to mobilize. Encouraged pursed lip breathing during session. Assisted to bathroom prior to hallway ambulation. At EOS returned to recliner chair with all needs met. Call bell in reach. Chair alarm on.   Erminio Leos,  Mobility Specialist Can be reached via Secure Chat

## 2024-12-11 LAB — CBC WITH DIFFERENTIAL/PLATELET
Abs Immature Granulocytes: 0.25 10*3/uL — ABNORMAL HIGH (ref 0.00–0.07)
Basophils Absolute: 0 10*3/uL (ref 0.0–0.1)
Basophils Relative: 0 %
Eosinophils Absolute: 0 10*3/uL (ref 0.0–0.5)
Eosinophils Relative: 0 %
HCT: 39.2 % (ref 39.0–52.0)
Hemoglobin: 12.5 g/dL — ABNORMAL LOW (ref 13.0–17.0)
Immature Granulocytes: 2 %
Lymphocytes Relative: 10 %
Lymphs Abs: 1.4 10*3/uL (ref 0.7–4.0)
MCH: 29.6 pg (ref 26.0–34.0)
MCHC: 31.9 g/dL (ref 30.0–36.0)
MCV: 92.7 fL (ref 80.0–100.0)
Monocytes Absolute: 1.1 10*3/uL — ABNORMAL HIGH (ref 0.1–1.0)
Monocytes Relative: 8 %
Neutro Abs: 11.2 10*3/uL — ABNORMAL HIGH (ref 1.7–7.7)
Neutrophils Relative %: 80 %
Platelets: 171 10*3/uL (ref 150–400)
RBC: 4.23 MIL/uL (ref 4.22–5.81)
RDW: 13.1 % (ref 11.5–15.5)
WBC: 13.9 10*3/uL — ABNORMAL HIGH (ref 4.0–10.5)
nRBC: 0 % (ref 0.0–0.2)

## 2024-12-11 LAB — BASIC METABOLIC PANEL WITH GFR
Anion gap: 13 (ref 5–15)
BUN: 27 mg/dL — ABNORMAL HIGH (ref 8–23)
CO2: 24 mmol/L (ref 22–32)
Calcium: 9.4 mg/dL (ref 8.9–10.3)
Chloride: 102 mmol/L (ref 98–111)
Creatinine, Ser: 0.75 mg/dL (ref 0.61–1.24)
GFR, Estimated: >60 mL/min
Glucose, Bld: 134 mg/dL — ABNORMAL HIGH (ref 70–99)
Potassium: 4 mmol/L (ref 3.5–5.1)
Sodium: 139 mmol/L (ref 135–145)

## 2024-12-11 NOTE — Plan of Care (Signed)
  Problem: Clinical Measurements: Goal: Respiratory complications will improve Outcome: Progressing   Problem: Clinical Measurements: Goal: Cardiovascular complication will be avoided Outcome: Progressing   Problem: Nutrition: Goal: Adequate nutrition will be maintained Outcome: Progressing   Problem: Elimination: Goal: Will not experience complications related to bowel motility Outcome: Progressing   Problem: Elimination: Goal: Will not experience complications related to urinary retention Outcome: Progressing

## 2024-12-11 NOTE — Progress Notes (Signed)
 Mobility Specialist - Progress Note   12/11/24 1507  Mobility  Activity Ambulated with assistance  Level of Assistance Standby assist, set-up cues, supervision of patient - no hands on  Assistive Device Front wheel walker  Distance Ambulated (ft) 500 ft  Range of Motion/Exercises Active  Activity Response Tolerated well  Mobility visit 1 Mobility  Mobility Specialist Start Time (ACUTE ONLY) 1450  Mobility Specialist Stop Time (ACUTE ONLY) 1507  Mobility Specialist Time Calculation (min) (ACUTE ONLY) 17 min   Pt was found in bed and agreeable to mobilize after bathroom use. C/o congestion. At EOS returned to bed with all needs met. Call bell in reach.   Erminio Leos,  Mobility Specialist Can be reached via Secure Chat

## 2024-12-11 NOTE — Progress Notes (Signed)
 " PROGRESS NOTE    Mills Kenneth  FMW:[id number] DOB: 04-Nov-1951 DOA: 12/09/2024 PCP: Nanci Senior, MD  Subjective:  Patient states he had a large bowel movement today.  Otherwise no complaints.  Denies cough sputum chest or abdominal pain.  Hospital Course:    Assessment and Plan:    Sepsis on admission secondary to Community-acquired pneumonia with lactic acidosis            - Leukocytosis most likely secondary to steroids.   underlying COPD Continues on coverage with doxycycline  and ceftriaxone -was given a dose of steroids IV and continues on some steroids as well--- will circumscribe prednisone  40 in the next several days Received several boluses of fluid 2.5 crystalloid currently on 150 cc/H currently hypertensive switched to 75 cc/H--- follow-up blood culture performed and narrow antibiotics as able- Strep pneumo Legionella not obtained on admission Mucinex  1 tab twice daily  continued DuoNeb 3 mL every 6 as needed wheeze Trial desat screen today    history of AVR 2018  keep on monitors overnight continues cautious metoprolol  25 XL daily   Elevated BNP on admission   NICM LBBB with CRT implant 2019-- last echo 11/22/2023 EF 60-6065%   cautious with fluids given underlying heart failure history  continue on losartan  50, metoprolol  as above  I think the BNP may be an acute phase reactant and will evaluate him he has not been on Lasix  previously-his x-ray is more consistent with pneumonia and he is not swollen   impaired glucose tolerance previously  would just check the sugars on a regular chemistries-anytime above 180 with then consider sliding scale     Data Reviewed today sodium 139, potassium 4 BUN 27 creatinine 0.75 white count 13,900. Blood cultures negative to date, MRSA PCR screen negative.  Respiratory PCR panel negative.  DVT prophylaxis: enoxaparin  (LOVENOX ) injection 40 mg Start: 12/10/24 1000     Code Status: Limited: Do not attempt resuscitation (DNR)  -DNR-LIMITED -Do Not Intubate/DNI  Family Communication: None available at bedside Disposition Plan: Home Reason for continuing need for hospitalization: Follow-up blood cultures  Objective: Vitals:   12/10/24 1251 12/10/24 1608 12/10/24 2000 12/11/24 0704  BP: (!) 174/82 (!) 151/85 (!) 159/73 (!) 163/85  Pulse: (!) 101 (!) 105 97 (!) 104  Resp: 20 20 16 16   Temp: 97.9 F (36.6 C) 97.9 F (36.6 C) 98.2 F (36.8 C) 97.7 F (36.5 C)  TempSrc: Oral Oral    SpO2: 93% 92% 91% 91%  Weight:      Height:        Intake/Output Summary (Last 24 hours) at 12/11/2024 0802 Last data filed at 12/10/2024 2300 Gross per 24 hour  Intake 2514.16 ml  Output 825 ml  Net 1689.16 ml   Filed Weights   12/10/24 0928  Weight: 69.4 kg    Examination:  Awake, alert, comfortable HEENT: PERRLA EOMI no scleral icterus Cardiovascular: Regular rate and rhythm S1-S2 no murmurs rubs gallops Lungs: Diffuse expiratory wheezes but good air entry Abdomen: Soft, nontender, nondistended bowel sounds present Extremities: No sinus clubbing or edema Neurologic: Awake alert answers questions appropriately coherent moves all extremities symmetrically  Data Reviewed: I have personally reviewed following labs and imaging studies  CBC: Recent Labs  Lab 12/09/24 1725 12/10/24 1052 12/11/24 0530  WBC 11.1* 10.3 13.9*  NEUTROABS  --   --  11.2*  HGB 14.6 13.1 12.5*  HCT 44.3 40.7 39.2  MCV 90.6 92.9 92.7  PLT 186 171 171   Basic  Metabolic Panel: Recent Labs  Lab 12/09/24 1725 12/10/24 1052 12/11/24 0530  NA 141 137 139  K 4.1 4.0 4.0  CL 102 101 102  CO2 23 20* 24  GLUCOSE 169* 194* 134*  BUN 21 19 27*  CREATININE 0.87 0.73 0.75  CALCIUM  10.1 9.5 9.4   GFR: Estimated Creatinine Clearance: 79.5 mL/min (by C-G formula based on SCr of 0.75 mg/dL). Liver Function Tests: Recent Labs  Lab 12/09/24 1725 12/10/24 1052  AST 57* 62*  ALT 11 10  ALKPHOS 113 96  BILITOT 0.6 0.7  PROT 7.6 6.7   ALBUMIN  4.2 3.4*   No results for input(s): LIPASE, AMYLASE in the last 168 hours. No results for input(s): AMMONIA in the last 168 hours. Coagulation Profile: Recent Labs  Lab 12/09/24 1725  INR 1.0   Cardiac Enzymes: No results for input(s): CKTOTAL, CKMB, CKMBINDEX, TROPONINI in the last 168 hours. ProBNP, BNP (last 5 results) Recent Labs    12/09/24 1725  PROBNP 784.0*   HbA1C: No results for input(s): HGBA1C in the last 72 hours. CBG: No results for input(s): GLUCAP in the last 168 hours. Lipid Profile: No results for input(s): CHOL, HDL, LDLCALC, TRIG, CHOLHDL, LDLDIRECT in the last 72 hours. Thyroid Function Tests: No results for input(s): TSH, T4TOTAL, FREET4, T3FREE, THYROIDAB in the last 72 hours. Anemia Panel: No results for input(s): VITAMINB12, FOLATE, FERRITIN, TIBC, IRON, RETICCTPCT in the last 72 hours. Sepsis Labs: Recent Labs  Lab 12/09/24 0011 12/09/24 1832 12/09/24 2017 12/10/24 0338 12/10/24 0500  PROCALCITON  --   --   --   --  0.25  LATICACIDVEN 2.3* 3.0* 3.3* 2.3*  --     Recent Results (from the past 240 hours)  Resp panel by RT-PCR (RSV, Flu A&B, Covid) Anterior Nasal Swab     Status: None   Collection Time: 12/09/24  6:32 PM   Specimen: Anterior Nasal Swab  Result Value Ref Range Status   SARS Coronavirus 2 by RT PCR NEGATIVE NEGATIVE Final    Comment: (NOTE) SARS-CoV-2 target nucleic acids are NOT DETECTED.  The SARS-CoV-2 RNA is generally detectable in upper respiratory specimens during the acute phase of infection. The lowest concentration of SARS-CoV-2 viral copies this assay can detect is 138 copies/mL. A negative result does not preclude SARS-Cov-2 infection and should not be used as the sole basis for treatment or other patient management decisions. A negative result may occur with  improper specimen collection/handling, submission of specimen other than nasopharyngeal swab,  presence of viral mutation(s) within the areas targeted by this assay, and inadequate number of viral copies(<138 copies/mL). A negative result must be combined with clinical observations, patient history, and epidemiological information. The expected result is Negative.  Fact Sheet for Patients:  bloggercourse.com  Fact Sheet for Healthcare Providers:  seriousbroker.it  This test is no t yet approved or cleared by the [place]  FDA and  has been authorized for detection and/or diagnosis of SARS-CoV-2 by FDA under an Emergency Use Authorization (EUA). This EUA will remain  in effect (meaning this test can be used) for the duration of the COVID-19 declaration under Section 564(b)(1) of the Act, 21 U.S.C.section 360bbb-3(b)(1), unless the authorization is terminated  or revoked sooner.       Influenza A by PCR NEGATIVE NEGATIVE Final   Influenza B by PCR NEGATIVE NEGATIVE Final    Comment: (NOTE) The Xpert Xpress SARS-CoV-2/FLU/RSV plus assay is intended as an aid in the diagnosis of influenza from Nasopharyngeal swab  specimens and should not be used as a sole basis for treatment. Nasal washings and aspirates are unacceptable for Xpert Xpress SARS-CoV-2/FLU/RSV testing.  Fact Sheet for Patients: bloggercourse.com  Fact Sheet for Healthcare Providers: seriousbroker.it  This test is not yet approved or cleared by the [place]  FDA and has been authorized for detection and/or diagnosis of SARS-CoV-2 by FDA under an Emergency Use Authorization (EUA). This EUA will remain in effect (meaning this test can be used) for the duration of the COVID-19 declaration under Section 564(b)(1) of the Act, 21 U.S.C. section 360bbb-3(b)(1), unless the authorization is terminated or revoked.     Resp Syncytial Virus by PCR NEGATIVE NEGATIVE Final    Comment: (NOTE) Fact Sheet for  Patients: bloggercourse.com  Fact Sheet for Healthcare Providers: seriousbroker.it  This test is not yet approved or cleared by the [place]  FDA and has been authorized for detection and/or diagnosis of SARS-CoV-2 by FDA under an Emergency Use Authorization (EUA). This EUA will remain in effect (meaning this test can be used) for the duration of the COVID-19 declaration under Section 564(b)(1) of the Act, 21 U.S.C. section 360bbb-3(b)(1), unless the authorization is terminated or revoked.  Performed at [facility], [address] [place], [place] [address]   Blood Culture (routine x 2)     Status: None (Preliminary result)   Collection Time: 12/09/24  6:32 PM   Specimen: BLOOD  Result Value Ref Range Status   Specimen Description   Final    BLOOD RIGHT ANTECUBITAL Performed at [facility], [address] [place], [place] [address]    Special Requests   Final    Blood Culture adequate volume Performed at [facility], [address] [place], [place] [address]    Culture   Final    NO GROWTH 2 DAYS Performed at [facility], [address] [place], [place] [address]    Report Status PENDING  Incomplete  Respiratory (~20 pathogens) panel by PCR     Status: None   Collection Time: 12/09/24 10:16 PM   Specimen: Nasopharyngeal Swab; Respiratory  Result Value Ref Range Status   Adenovirus NOT DETECTED NOT DETECTED Final   Coronavirus 229E NOT DETECTED NOT DETECTED Final    Comment: (NOTE) The Coronavirus on the Respiratory Panel, DOES NOT test for the novel  Coronavirus (2019 nCoV)    Coronavirus HKU1 NOT DETECTED NOT DETECTED Final   Coronavirus NL63 NOT DETECTED NOT DETECTED Final   Coronavirus OC43 NOT DETECTED NOT DETECTED Final   Metapneumovirus NOT DETECTED NOT DETECTED Final   Rhinovirus / Enterovirus NOT DETECTED NOT DETECTED Final   Influenza A NOT DETECTED NOT  DETECTED Final   Influenza B NOT DETECTED NOT DETECTED Final   Parainfluenza Virus 1 NOT DETECTED NOT DETECTED Final   Parainfluenza Virus 2 NOT DETECTED NOT DETECTED Final   Parainfluenza Virus 3 NOT DETECTED NOT DETECTED Final   Parainfluenza Virus 4 NOT DETECTED NOT DETECTED Final   Respiratory Syncytial Virus NOT DETECTED NOT DETECTED Final   Bordetella pertussis NOT DETECTED NOT DETECTED Final   Bordetella Parapertussis NOT DETECTED NOT DETECTED Final   Chlamydophila pneumoniae NOT DETECTED NOT DETECTED Final   Mycoplasma pneumoniae NOT DETECTED NOT DETECTED Final    Comment: Performed at [facility], [address] [place], [place] [address]  Blood Culture (routine x 2)     Status: None (Preliminary result)   Collection Time: 12/10/24 10:15 AM  Specimen: BLOOD  Result Value Ref Range Status   Specimen Description   Final    BLOOD BLOOD LEFT HAND Performed at [facility], [address] [place], [place] [address]    Special Requests   Final    BOTTLES DRAWN AEROBIC AND ANAEROBIC Blood Culture results may not be optimal due to an inadequate volume of blood received in culture bottles Performed at [facility], [address], [place], [place] [address]    Culture   Final    NO GROWTH < 24 HOURS Performed at [facility], [address] [place], [place] [address]    Report Status PENDING  Incomplete  MRSA Next Gen by PCR, Nasal     Status: None   Collection Time: 12/10/24  3:58 PM   Specimen: Nasal Mucosa; Nasal Swab  Result Value Ref Range Status   MRSA by PCR Next Gen NOT DETECTED NOT DETECTED Final    Comment: (NOTE) The GeneXpert MRSA Assay (FDA approved for NASAL specimens only), is one component of a comprehensive MRSA colonization surveillance program. It is not intended to diagnose MRSA infection nor to guide or monitor treatment for MRSA infections. Test performance is not FDA approved in patients less than 10  years old. Performed at [facility], [address] [place], [place] [address]      Radiology Studies: DG Chest 2 View Result Date: 12/09/2024 CLINICAL DATA:  Short of breath EXAM: CHEST - 2 VIEW COMPARISON:  11/08/2022 FINDINGS: Frontal and lateral views of the chest demonstrates stable postsurgical changes from median sternotomy and aortic valve replacement. Multi lead pacer unchanged. Left lower lobe consolidation consistent with pneumonia. No effusion or pneumothorax. No acute bony abnormalities. IMPRESSION: 1. Left lower lobe airspace disease compatible with pneumonia. Followup PA and lateral chest X-ray is recommended in 3-4 weeks following trial of antibiotic therapy to ensure resolution and exclude underlying malignancy. Electronically Signed   By: Ozell Daring M.D.   On: 12/09/2024 17:51    Scheduled Meds:  aspirin  EC  81 mg Oral Daily   atorvastatin   20 mg Oral Daily   dextromethorphan -guaiFENesin   1 tablet Oral BID   doxycycline   100 mg Oral Q12H   enoxaparin  (LOVENOX ) injection  40 mg Subcutaneous Q24H   losartan   50 mg Oral Daily   metoprolol  succinate  25 mg Oral Daily   multivitamin with minerals  1 tablet Oral Daily   predniSONE   40 mg Oral Q breakfast   Continuous Infusions:  cefTRIAXone  (ROCEPHIN )  IV 1 g (12/10/24 1751)     LOS: 1 day   Time spent: 35 minutes  Lonni KANDICE Moose, MD  [facility]  12/11/2024, 8:02 AM   "

## 2024-12-12 LAB — BASIC METABOLIC PANEL WITH GFR
Anion gap: 16 — ABNORMAL HIGH (ref 5–15)
BUN: 28 mg/dL — ABNORMAL HIGH (ref 8–23)
CO2: 22 mmol/L (ref 22–32)
Calcium: 9.7 mg/dL (ref 8.9–10.3)
Chloride: 101 mmol/L (ref 98–111)
Creatinine, Ser: 0.8 mg/dL (ref 0.61–1.24)
GFR, Estimated: >60 mL/min
Glucose, Bld: 124 mg/dL — ABNORMAL HIGH (ref 70–99)
Potassium: 4.2 mmol/L (ref 3.5–5.1)
Sodium: 139 mmol/L (ref 135–145)

## 2024-12-12 LAB — CBC WITH DIFFERENTIAL/PLATELET
Abs Immature Granulocytes: 0.29 10*3/uL — ABNORMAL HIGH (ref 0.00–0.07)
Basophils Absolute: 0 10*3/uL (ref 0.0–0.1)
Basophils Relative: 0 %
Eosinophils Absolute: 0 10*3/uL (ref 0.0–0.5)
Eosinophils Relative: 0 %
HCT: 39.1 % (ref 39.0–52.0)
Hemoglobin: 12.8 g/dL — ABNORMAL LOW (ref 13.0–17.0)
Immature Granulocytes: 2 %
Lymphocytes Relative: 10 %
Lymphs Abs: 1.2 10*3/uL (ref 0.7–4.0)
MCH: 29.8 pg (ref 26.0–34.0)
MCHC: 32.7 g/dL (ref 30.0–36.0)
MCV: 90.9 fL (ref 80.0–100.0)
Monocytes Absolute: 0.9 10*3/uL (ref 0.1–1.0)
Monocytes Relative: 8 %
Neutro Abs: 10 10*3/uL — ABNORMAL HIGH (ref 1.7–7.7)
Neutrophils Relative %: 80 %
Platelets: 171 10*3/uL (ref 150–400)
RBC: 4.3 MIL/uL (ref 4.22–5.81)
RDW: 13 % (ref 11.5–15.5)
WBC: 12.4 10*3/uL — ABNORMAL HIGH (ref 4.0–10.5)
nRBC: 0 % (ref 0.0–0.2)

## 2024-12-12 LAB — LEGIONELLA PNEUMOPHILA SEROGP 1 UR AG: L. pneumophila Serogp 1 Ur Ag: NEGATIVE

## 2024-12-12 MED ORDER — DM-GUAIFENESIN ER 30-600 MG PO TB12: 1.0000 | ORAL_TABLET | Freq: Two times a day (BID) | ORAL | 0 refills | Status: AC

## 2024-12-12 MED ORDER — PREDNISONE 20 MG PO TABS: 40.0000 mg | ORAL_TABLET | Freq: Every day | ORAL | 0 refills | Status: AC

## 2024-12-12 MED ORDER — MULTIVITAMIN ADULT PO CHEW: 1.0000 | CHEWABLE_TABLET | Freq: Every day | ORAL | Status: AC

## 2024-12-12 MED ORDER — LOSARTAN POTASSIUM 50 MG PO TABS: 50.0000 mg | ORAL_TABLET | Freq: Every day | ORAL | 0 refills | Status: AC

## 2024-12-12 MED ORDER — IPRATROPIUM-ALBUTEROL 0.5-2.5 (3) MG/3ML IN SOLN: 3.0000 mL | Freq: Four times a day (QID) | RESPIRATORY_TRACT | 1 refills | Status: AC | PRN

## 2024-12-12 MED ORDER — DOXYCYCLINE HYCLATE 100 MG PO TABS: 100.0000 mg | ORAL_TABLET | Freq: Two times a day (BID) | ORAL | 0 refills | Status: AC

## 2024-12-12 NOTE — Progress Notes (Signed)
 " PROGRESS NOTE    Kenneth Mills  FMW:[id number] DOB: Mar 21, 1951 DOA: 12/09/2024 PCP: Nanci Senior, MD  Subjective:  Patient states he did not sleep well last night. He states he will sleep better at home.  Feels well enough to go home.  Needs a nebulizer and Aerobika device. Not ready to completely stop smoking but cut down to 6 cigarettes a day on January 16.  Hospital Course:    Assessment and Plan:    Sepsis on admission secondary to Community-acquired pneumonia with lactic acidosis            - Leukocytosis most likely secondary to steroids.   underlying COPD Continues on coverage with doxycycline  and ceftriaxone -was given a dose of steroids IV and continues on some steroids as well--- will circumscribe prednisone  40 in the next several days Received several boluses of fluid 2.5 crystalloid currently on 150 cc/H currently hypertensive switched to 75 cc/H--- follow-up blood culture performed and narrow antibiotics as able- Strep pneumo Legionella not obtained on admission Mucinex  1 tab twice daily  continued DuoNeb 3 mL every 6 as needed wheeze Trial desat screen today    history of AVR 2018  keep on monitors overnight continues cautious metoprolol  25 XL daily   Elevated BNP on admission   NICM LBBB with CRT implant 2019-- last echo 11/22/2023 EF 60-6065%   cautious with fluids given underlying heart failure history  continue on losartan  50, metoprolol  as above  I think the BNP may be an acute phase reactant and will evaluate him he has not been on Lasix  previously-his x-ray is more consistent with pneumonia and he is not swollen   impaired glucose tolerance previously  would just check the sugars on a regular chemistries-anytime above 180 with then consider sliding scale     Data Reviewed today sodium 139, potassium 4 BUN 27 creatinine 0.75 white count 13,900. Blood cultures negative to date, MRSA PCR screen negative.  Respiratory PCR panel negative.  DVT prophylaxis:  enoxaparin  (LOVENOX ) injection 40 mg Start: 12/10/24 1000     Code Status: Limited: Do not attempt resuscitation (DNR) -DNR-LIMITED -Do Not Intubate/DNI  Family Communication: None available at bedside Disposition Plan: Home Reason for continuing need for hospitalization: Follow-up blood cultures  Objective: Vitals:   12/11/24 0704 12/11/24 1335 12/11/24 2119 12/12/24 0616  BP: (!) 163/85 130/76 (!) 160/72 (!) 158/87  Pulse: (!) 104 95 70 79  Resp: 16 20 20 20   Temp: 97.7 F (36.5 C) 98.7 F (37.1 C) 98.1 F (36.7 C) 98.5 F (36.9 C)  TempSrc:  Oral Oral Oral  SpO2: 91% 90% 92% 93%  Weight:      Height:        Intake/Output Summary (Last 24 hours) at 12/12/2024 0751 Last data filed at 12/12/2024 0600 Gross per 24 hour  Intake --  Output 1125 ml  Net -1125 ml   Filed Weights   12/10/24 0928  Weight: 69.4 kg    Examination:  Awake, alert, comfortable HEENT: PERRLA EOMI no scleral icterus Cardiovascular: Regular rate and rhythm S1-S2 no murmurs rubs gallops Lungs: Diffuse expiratory wheezes but good air entry Abdomen: Soft, nontender, nondistended bowel sounds present Extremities: No sinus clubbing or edema Neurologic: Awake alert answers questions appropriately coherent moves all extremities symmetrically  Data Reviewed: I have personally reviewed following labs and imaging studies  CBC: Recent Labs  Lab 12/09/24 1725 12/10/24 1052 12/11/24 0530 12/12/24 0524  WBC 11.1* 10.3 13.9* 12.4*  NEUTROABS  --   --  11.2* 10.0*  HGB 14.6 13.1 12.5* 12.8*  HCT 44.3 40.7 39.2 39.1  MCV 90.6 92.9 92.7 90.9  PLT 186 171 171 171   Basic Metabolic Panel: Recent Labs  Lab 12/09/24 1725 12/10/24 1052 12/11/24 0530 12/12/24 0524  NA 141 137 139 139  K 4.1 4.0 4.0 4.2  CL 102 101 102 101  CO2 23 20* 24 22  GLUCOSE 169* 194* 134* 124*  BUN 21 19 27* 28*  CREATININE 0.87 0.73 0.75 0.80  CALCIUM  10.1 9.5 9.4 9.7   GFR: Estimated Creatinine Clearance: 79.5 mL/min  (by C-G formula based on SCr of 0.8 mg/dL). Liver Function Tests: Recent Labs  Lab 12/09/24 1725 12/10/24 1052  AST 57* 62*  ALT 11 10  ALKPHOS 113 96  BILITOT 0.6 0.7  PROT 7.6 6.7  ALBUMIN  4.2 3.4*   No results for input(s): LIPASE, AMYLASE in the last 168 hours. No results for input(s): AMMONIA in the last 168 hours. Coagulation Profile: Recent Labs  Lab 12/09/24 1725  INR 1.0   Cardiac Enzymes: No results for input(s): CKTOTAL, CKMB, CKMBINDEX, TROPONINI in the last 168 hours. ProBNP, BNP (last 5 results) Recent Labs    12/09/24 1725  PROBNP 784.0*   HbA1C: No results for input(s): HGBA1C in the last 72 hours. CBG: No results for input(s): GLUCAP in the last 168 hours. Lipid Profile: No results for input(s): CHOL, HDL, LDLCALC, TRIG, CHOLHDL, LDLDIRECT in the last 72 hours. Thyroid Function Tests: No results for input(s): TSH, T4TOTAL, FREET4, T3FREE, THYROIDAB in the last 72 hours. Anemia Panel: No results for input(s): VITAMINB12, FOLATE, FERRITIN, TIBC, IRON, RETICCTPCT in the last 72 hours. Sepsis Labs: Recent Labs  Lab 12/09/24 0011 12/09/24 1832 12/09/24 2017 12/10/24 0338 12/10/24 0500  PROCALCITON  --   --   --   --  0.25  LATICACIDVEN 2.3* 3.0* 3.3* 2.3*  --     Recent Results (from the past 240 hours)  Resp panel by RT-PCR (RSV, Flu A&B, Covid) Anterior Nasal Swab     Status: None   Collection Time: 12/09/24  6:32 PM   Specimen: Anterior Nasal Swab  Result Value Ref Range Status   SARS Coronavirus 2 by RT PCR NEGATIVE NEGATIVE Final    Comment: (NOTE) SARS-CoV-2 target nucleic acids are NOT DETECTED.  The SARS-CoV-2 RNA is generally detectable in upper respiratory specimens during the acute phase of infection. The lowest concentration of SARS-CoV-2 viral copies this assay can detect is 138 copies/mL. A negative result does not preclude SARS-Cov-2 infection and should not be used as the  sole basis for treatment or other patient management decisions. A negative result may occur with  improper specimen collection/handling, submission of specimen other than nasopharyngeal swab, presence of viral mutation(s) within the areas targeted by this assay, and inadequate number of viral copies(<138 copies/mL). A negative result must be combined with clinical observations, patient history, and epidemiological information. The expected result is Negative.  Fact Sheet for Patients:  bloggercourse.com  Fact Sheet for Healthcare Providers:  seriousbroker.it  This test is no t yet approved or cleared by the [place]  FDA and  has been authorized for detection and/or diagnosis of SARS-CoV-2 by FDA under an Emergency Use Authorization (EUA). This EUA will remain  in effect (meaning this test can be used) for the duration of the COVID-19 declaration under Section 564(b)(1) of the Act, 21 U.S.C.section 360bbb-3(b)(1), unless the authorization is terminated  or revoked sooner.       Influenza  A by PCR NEGATIVE NEGATIVE Final   Influenza B by PCR NEGATIVE NEGATIVE Final    Comment: (NOTE) The Xpert Xpress SARS-CoV-2/FLU/RSV plus assay is intended as an aid in the diagnosis of influenza from Nasopharyngeal swab specimens and should not be used as a sole basis for treatment. Nasal washings and aspirates are unacceptable for Xpert Xpress SARS-CoV-2/FLU/RSV testing.  Fact Sheet for Patients: bloggercourse.com  Fact Sheet for Healthcare Providers: seriousbroker.it  This test is not yet approved or cleared by the [place]  FDA and has been authorized for detection and/or diagnosis of SARS-CoV-2 by FDA under an Emergency Use Authorization (EUA). This EUA will remain in effect (meaning this test can be used) for the duration of the COVID-19 declaration under Section 564(b)(1) of the  Act, 21 U.S.C. section 360bbb-3(b)(1), unless the authorization is terminated or revoked.     Resp Syncytial Virus by PCR NEGATIVE NEGATIVE Final    Comment: (NOTE) Fact Sheet for Patients: bloggercourse.com  Fact Sheet for Healthcare Providers: seriousbroker.it  This test is not yet approved or cleared by the [place]  FDA and has been authorized for detection and/or diagnosis of SARS-CoV-2 by FDA under an Emergency Use Authorization (EUA). This EUA will remain in effect (meaning this test can be used) for the duration of the COVID-19 declaration under Section 564(b)(1) of the Act, 21 U.S.C. section 360bbb-3(b)(1), unless the authorization is terminated or revoked.  Performed at [facility], [address] [place], [place] [address]   Blood Culture (routine x 2)     Status: None (Preliminary result)   Collection Time: 12/09/24  6:32 PM   Specimen: BLOOD  Result Value Ref Range Status   Specimen Description   Final    BLOOD RIGHT ANTECUBITAL Performed at [facility], [address] [place], [place] [address]    Special Requests   Final    Blood Culture adequate volume Performed at [facility], [address] [place], [place] [address]    Culture   Final    NO GROWTH 3 DAYS Performed at [facility], [address] [place], [place] [address]    Report Status PENDING  Incomplete  Respiratory (~20 pathogens) panel by PCR     Status: None   Collection Time: 12/09/24 10:16 PM   Specimen: Nasopharyngeal Swab; Respiratory  Result Value Ref Range Status   Adenovirus NOT DETECTED NOT DETECTED Final   Coronavirus 229E NOT DETECTED NOT DETECTED Final    Comment: (NOTE) The Coronavirus on the Respiratory Panel, DOES NOT test for the novel  Coronavirus (2019 nCoV)    Coronavirus HKU1 NOT DETECTED NOT DETECTED Final   Coronavirus NL63 NOT DETECTED NOT DETECTED Final   Coronavirus  OC43 NOT DETECTED NOT DETECTED Final   Metapneumovirus NOT DETECTED NOT DETECTED Final   Rhinovirus / Enterovirus NOT DETECTED NOT DETECTED Final   Influenza A NOT DETECTED NOT DETECTED Final   Influenza B NOT DETECTED NOT DETECTED Final   Parainfluenza Virus 1 NOT DETECTED NOT DETECTED Final   Parainfluenza Virus 2 NOT DETECTED NOT DETECTED Final   Parainfluenza Virus 3 NOT DETECTED NOT DETECTED Final   Parainfluenza Virus 4 NOT DETECTED NOT DETECTED Final   Respiratory Syncytial Virus NOT DETECTED NOT DETECTED Final   Bordetella pertussis NOT DETECTED NOT DETECTED Final   Bordetella Parapertussis NOT DETECTED NOT DETECTED Final   Chlamydophila pneumoniae NOT DETECTED NOT DETECTED Final   Mycoplasma pneumoniae NOT DETECTED NOT DETECTED  Final    Comment: Performed at [facility], [address], [place], [place] [address]  Blood Culture (routine x 2)     Status: None (Preliminary result)   Collection Time: 12/10/24 10:15 AM   Specimen: BLOOD LEFT HAND  Result Value Ref Range Status   Specimen Description   Final    BLOOD LEFT HAND Performed at [facility], [address] [place], [place] [address]    Special Requests   Final    BOTTLES DRAWN AEROBIC AND ANAEROBIC Blood Culture results may not be optimal due to an inadequate volume of blood received in culture bottles Performed at [facility], [address] [place], [place] [address]    Culture   Final    NO GROWTH 2 DAYS Performed at [facility], [address], [place], [place] [address]    Report Status PENDING  Incomplete  MRSA Next Gen by PCR, Nasal     Status: None   Collection Time: 12/10/24  3:58 PM   Specimen: Nasal Mucosa; Nasal Swab  Result Value Ref Range Status   MRSA by PCR Next Gen NOT DETECTED NOT DETECTED Final    Comment: (NOTE) The GeneXpert MRSA Assay (FDA approved for NASAL specimens only), is one component of a comprehensive MRSA colonization  surveillance program. It is not intended to diagnose MRSA infection nor to guide or monitor treatment for MRSA infections. Test performance is not FDA approved in patients less than 39 years old. Performed at [facility], [address] [place], [place] [address]      Radiology Studies: No results found.   Scheduled Meds:  aspirin  EC  81 mg Oral Daily   atorvastatin   20 mg Oral Daily   dextromethorphan -guaiFENesin   1 tablet Oral BID   doxycycline   100 mg Oral Q12H   enoxaparin  (LOVENOX ) injection  40 mg Subcutaneous Q24H   losartan   50 mg Oral Daily   metoprolol  succinate  25 mg Oral Daily   multivitamin with minerals  1 tablet Oral Daily   predniSONE   40 mg Oral Q breakfast   Continuous Infusions:  cefTRIAXone  (ROCEPHIN )  IV 1 g (12/11/24 1726)     LOS: 2 days   Time spent: 35 minutes  Lonni KANDICE Moose, MD  [facility]  12/12/2024, 7:51 AM   "

## 2024-12-12 NOTE — Progress Notes (Signed)
 Mobility Specialist - Progress Note   12/12/24 1029  Mobility  Activity Ambulated with assistance  Level of Assistance Modified independent, requires aide device or extra time  Assistive Device Front wheel walker  Distance Ambulated (ft) 500 ft  Range of Motion/Exercises Active  Activity Response Tolerated well  Mobility visit 1 Mobility  Mobility Specialist Start Time (ACUTE ONLY) 1019  Mobility Specialist Stop Time (ACUTE ONLY) 1029  Mobility Specialist Time Calculation (min) (ACUTE ONLY) 10 min   Pt was found in bed and agreeable to mobilize. No complaints. At EOS returned to bed with all needs met. Call bell in reach.   Erminio Leos,  Mobility Specialist Can be reached via Secure Chat

## 2024-12-12 NOTE — TOC Transition Note (Signed)
 Transition of Care [facility]) - Discharge Note   Patient Details  Name: Kenneth Mills MRN: [id number] Date of Birth: May 03, 1951  [facility] CM/SW Contact:  Bascom Service, RN Phone Number: 12/12/2024, 11:43 AM   Clinical Narrative:Await neb macihne order-[employer] rep Jermaine to deliver to rm prior d/c. No further CM needs.       Final next level of care: Home/Self Care Barriers to Discharge: No Barriers Identified   Patient Goals and CMS Choice Patient states their goals for this hospitalization and ongoing recovery are:: home CMS Medicare.gov Compare Post Acute Care list provided to:: Patient Choice offered to / list presented to : Patient [place] ownership interest in [facility].provided to:: Patient    Discharge Placement                       Discharge Plan and Services Additional resources added to the After Visit Summary for     Discharge Planning Services: CM Consult            DME Arranged: Nebulizer machine DME Agency: [employer] Date DME Agency Contacted: 12/12/24 Time DME Agency Contacted: 1143 Representative spoke with at DME Agency: [place]            Social Drivers of Health (SDOH) Interventions SDOH Screenings   Food Insecurity: No Food Insecurity (12/10/2024)  Housing: Low Risk (12/10/2024)  Transportation Needs: No Transportation Needs (12/10/2024)  Utilities: Not At Risk (12/10/2024)  Social Connections: Unknown (12/10/2024)  Tobacco Use: High Risk (12/10/2024)     Readmission Risk Interventions     No data to display

## 2024-12-12 NOTE — Progress Notes (Signed)
 Nebulizer delivered, ready for d/c awaiting ride.

## 2024-12-12 NOTE — Discharge Summary (Signed)
 " Physician Discharge Summary   Patient: Kenneth Mills MRN: [id number] DOB: 09-11-1951  Admit date:     12/09/2024  Discharge date: 12/12/24  Discharge Physician: Kenneth Mills   PCP: Kenneth Senior, MD   Recommendations at discharge:   Recommend you stop smoking completely. Use your nebulizer as scheduled Follow-up with your PCP in 1 to 2 weeks.  Discharge Diagnoses: Principal Problem:   Sepsis (HCC) Active Problems:   Status post implantation of automatic cardioverter/defibrillator (AICD)   Pneumonia of left lung due to infectious organism   Essential hypertension   History of transcatheter aortic valve replacement (TAVR)   COPD with acute exacerbation (HCC)  Resolved Problems:   * No resolved hospital problems. *  Hospital Course: 74 year old male with a history of COPD, nonischemic cardiomyopathy with an AICD in place, hypertension, history of TAVR, who presented with shortness of breath and a nonproductive cough.  Chest x-ray showed a probable left lower lobe pneumonia.  He was bronchospastic.  He was admitted and blood cultures were performed which remained sterile to date.  Received frequent nebulizer treatments along with IV ceftriaxone  and doxycycline .  He was given IV Solu-Medrol  in the ER and converted to oral prednisone .  At the time of discharge he was feeling much improved.  He still had some bronchospasm but good air entry.  He received 4 doses of IV ceftriaxone  and was discharged home on oral doxycycline  to finish the course.  He was sent on oral prednisone  and was sent with a nebulizer.  He was encouraged to stop smoking.  Assessment and Plan:    Sepsis on admission secondary to Community-acquired pneumonia with lactic acidosis            - Leukocytosis most likely secondary to steroids.    underlying COPD Continues on coverage with doxycycline  and ceftriaxone -was given a dose of steroids IV and continues on some steroids as well--- will circumscribe  prednisone  40 in the next several days Received several boluses of fluid 2.5 crystalloid currently on 150 cc/H currently hypertensive switched to 75 cc/H--- follow-up blood culture performed and narrow antibiotics as able- Strep pneumo Legionella not obtained on admission Mucinex  1 tab twice daily  continued DuoNeb 3 mL every 6 as needed wheeze Trial desat screen today    history of AVR 2018  keep on monitors overnight continues cautious metoprolol  25 XL daily   Elevated BNP on admission   NICM LBBB with CRT implant 2019-- last echo 11/22/2023 EF 60-6065%   cautious with fluids given underlying heart failure history  continue on losartan  50, metoprolol  as above  I think the BNP may be an acute phase reactant and will evaluate him he has not been on Lasix  previously-his x-ray is more consistent with pneumonia and he is not swollen   impaired glucose tolerance previously  would just check the sugars on a regular chemistries-anytime above 180 with then consider sliding scale        Consultants: None Procedures performed: None Disposition: Home Diet recommendation:  Discharge Diet Orders (From admission, onward)     Start     Ordered   12/12/24 0000  Diet general        12/12/24 1120            DISCHARGE MEDICATION: Allergies as of 12/12/2024   No Known Allergies      Medication List     STOP taking these medications    ketoconazole 2 % shampoo Commonly known as: NIZORAL  TAKE these medications    amoxicillin  500 MG tablet Commonly known as: AMOXIL  Take 4 tablets (2000 mg) 30-60 minutes prior to dental procedures   aspirin  EC 81 MG tablet Take 81 mg by mouth daily.   atorvastatin  20 MG tablet Commonly known as: LIPITOR  TAKE 1 TABLET ONE TIME DAILY AT 6PM   clonazePAM  0.5 MG tablet Commonly known as: KLONOPIN  Take 0.5 mg by mouth daily as needed for anxiety.   dextromethorphan -guaiFENesin  30-600 MG 12hr tablet Commonly known as: MUCINEX  DM Take  1 tablet by mouth 2 (two) times daily.   doxycycline  100 MG tablet Commonly known as: VIBRA -TABS Take 1 tablet (100 mg total) by mouth every 12 (twelve) hours for 3 days.   ipratropium-albuterol  0.5-2.5 (3) MG/3ML Soln Commonly known as: DUONEB Take 3 mLs by nebulization every 6 (six) hours as needed.   losartan  50 MG tablet Commonly known as: COZAAR  Take 1 tablet (50 mg total) by mouth daily. Start taking on: December 13, 2024 What changed: Another medication with the same name was removed. Continue taking this medication, and follow the directions you see here.   MELATONIN GUMMIES PO Take 1-2 tablets by mouth at bedtime as needed (sleep).   metoprolol  succinate 25 MG 24 hr tablet Commonly known as: TOPROL -XL TAKE 1 TABLET EVERY DAY   Multivitamin Adult Chew Chew 1 each by mouth daily.   predniSONE  20 MG tablet Commonly known as: DELTASONE  Take 2 tablets (40 mg total) by mouth daily with breakfast for 4 days. Start taking on: December 13, 2024               Durable Medical Equipment  (From admission, onward)           Start     Ordered   12/12/24 0000  For home use only DME Nebulizer machine       Question Answer Comment  Patient needs a nebulizer to treat with the following condition COPD (chronic obstructive pulmonary disease) case management patient [facility])   Length of Need Lifetime   Additional equipment included Administration kit   Additional equipment included Filter      12/12/24 1120            Follow-up Information     [facility] (DME) Follow up.   Specialty: DME Services Why: nebulizer machine Contact information: [address] [employer] [place]  [address] [phone]               Discharge Exam: Kenneth Mills   12/10/24 9071  Weight: 69.4 kg   Awake alert comfortable work of breathing at rest HEENT: PERRLA EOMI no scleral icterus oropharynx moist and pink CV: Regular rate and rhythm S1-S2 no  murmurs rubs gallops Lungs: Good air entry throughout with diffuse expiratory wheezing Abdomen: Soft, nontender, nondistended bowel sounds present Extremities: No cyanosis clubbing or edema Neurologic: Nonfocal  Condition at discharge: good  The results of significant diagnostics from this hospitalization (including imaging, microbiology, ancillary and laboratory) are listed below for reference.   Imaging Studies: DG Chest 2 View Result Date: 12/09/2024 CLINICAL DATA:  Short of breath EXAM: CHEST - 2 VIEW COMPARISON:  11/08/2022 FINDINGS: Frontal and lateral views of the chest demonstrates stable postsurgical changes from median sternotomy and aortic valve replacement. Multi lead pacer unchanged. Left lower lobe consolidation consistent with pneumonia. No effusion or pneumothorax. No acute bony abnormalities. IMPRESSION: 1. Left lower lobe airspace disease compatible with pneumonia. Followup PA and lateral chest X-ray is recommended in  3-4 weeks following trial of antibiotic therapy to ensure resolution and exclude underlying malignancy. Electronically Signed   By: Ozell Daring M.D.   On: 12/09/2024 17:51   CUP PACEART REMOTE DEVICE CHECK Result Date: 11/28/2024 ICD Monthly battery check.  Estimated 69mo Normal device function. No new alerts. Follow up as scheduled monthly LA, CVRS   Microbiology: Results for orders placed or performed during the hospital encounter of 12/09/24  Resp panel by RT-PCR (RSV, Flu A&B, Covid) Anterior Nasal Swab     Status: None   Collection Time: 12/09/24  6:32 PM   Specimen: Anterior Nasal Swab  Result Value Ref Range Status   SARS Coronavirus 2 by RT PCR NEGATIVE NEGATIVE Final    Comment: (NOTE) SARS-CoV-2 target nucleic acids are NOT DETECTED.  The SARS-CoV-2 RNA is generally detectable in upper respiratory specimens during the acute phase of infection. The lowest concentration of SARS-CoV-2 viral copies this assay can detect is 138 copies/mL. A  negative result does not preclude SARS-Cov-2 infection and should not be used as the sole basis for treatment or other patient management decisions. A negative result may occur with  improper specimen collection/handling, submission of specimen other than nasopharyngeal swab, presence of viral mutation(s) within the areas targeted by this assay, and inadequate number of viral copies(<138 copies/mL). A negative result must be combined with clinical observations, patient history, and epidemiological information. The expected result is Negative.  Fact Sheet for Patients:  bloggercourse.com  Fact Sheet for Healthcare Providers:  seriousbroker.it  This test is no t yet approved or cleared by the [place]  FDA and  has been authorized for detection and/or diagnosis of SARS-CoV-2 by FDA under an Emergency Use Authorization (EUA). This EUA will remain  in effect (meaning this test can be used) for the duration of the COVID-19 declaration under Section 564(b)(1) of the Act, 21 U.S.C.section 360bbb-3(b)(1), unless the authorization is terminated  or revoked sooner.       Influenza A by PCR NEGATIVE NEGATIVE Final   Influenza B by PCR NEGATIVE NEGATIVE Final    Comment: (NOTE) The Xpert Xpress SARS-CoV-2/FLU/RSV plus assay is intended as an aid in the diagnosis of influenza from Nasopharyngeal swab specimens and should not be used as a sole basis for treatment. Nasal washings and aspirates are unacceptable for Xpert Xpress SARS-CoV-2/FLU/RSV testing.  Fact Sheet for Patients: bloggercourse.com  Fact Sheet for Healthcare Providers: seriousbroker.it  This test is not yet approved or cleared by the [place]  FDA and has been authorized for detection and/or diagnosis of SARS-CoV-2 by FDA under an Emergency Use Authorization (EUA). This EUA will remain in effect (meaning this test can  be used) for the duration of the COVID-19 declaration under Section 564(b)(1) of the Act, 21 U.S.C. section 360bbb-3(b)(1), unless the authorization is terminated or revoked.     Resp Syncytial Virus by PCR NEGATIVE NEGATIVE Final    Comment: (NOTE) Fact Sheet for Patients: bloggercourse.com  Fact Sheet for Healthcare Providers: seriousbroker.it  This test is not yet approved or cleared by the [place]  FDA and has been authorized for detection and/or diagnosis of SARS-CoV-2 by FDA under an Emergency Use Authorization (EUA). This EUA will remain in effect (meaning this test can be used) for the duration of the COVID-19 declaration under Section 564(b)(1) of the Act, 21 U.S.C. section 360bbb-3(b)(1), unless the authorization is terminated or revoked.  Performed at [facility], [address] [place], [place] [address]   Blood Culture (routine x  2)     Status: None (Preliminary result)   Collection Time: 12/09/24  6:32 PM   Specimen: BLOOD  Result Value Ref Range Status   Specimen Description   Final    BLOOD RIGHT ANTECUBITAL Performed at [facility], [address] [place], [place] [address]    Special Requests   Final    Blood Culture adequate volume Performed at [facility], [address] [place], [place] [address]    Culture   Final    NO GROWTH 3 DAYS Performed at [facility], [address], [place], [place] [address]    Report Status PENDING  Incomplete  Respiratory (~20 pathogens) panel by PCR     Status: None   Collection Time: 12/09/24 10:16 PM   Specimen: Nasopharyngeal Swab; Respiratory  Result Value Ref Range Status   Adenovirus NOT DETECTED NOT DETECTED Final   Coronavirus 229E NOT DETECTED NOT DETECTED Final    Comment: (NOTE) The Coronavirus on the Respiratory Panel, DOES NOT test for the novel  Coronavirus (2019 nCoV)    Coronavirus HKU1 NOT DETECTED  NOT DETECTED Final   Coronavirus NL63 NOT DETECTED NOT DETECTED Final   Coronavirus OC43 NOT DETECTED NOT DETECTED Final   Metapneumovirus NOT DETECTED NOT DETECTED Final   Rhinovirus / Enterovirus NOT DETECTED NOT DETECTED Final   Influenza A NOT DETECTED NOT DETECTED Final   Influenza B NOT DETECTED NOT DETECTED Final   Parainfluenza Virus 1 NOT DETECTED NOT DETECTED Final   Parainfluenza Virus 2 NOT DETECTED NOT DETECTED Final   Parainfluenza Virus 3 NOT DETECTED NOT DETECTED Final   Parainfluenza Virus 4 NOT DETECTED NOT DETECTED Final   Respiratory Syncytial Virus NOT DETECTED NOT DETECTED Final   Bordetella pertussis NOT DETECTED NOT DETECTED Final   Bordetella Parapertussis NOT DETECTED NOT DETECTED Final   Chlamydophila pneumoniae NOT DETECTED NOT DETECTED Final   Mycoplasma pneumoniae NOT DETECTED NOT DETECTED Final    Comment: Performed at [facility], [address] [place], [place] [address]  Blood Culture (routine x 2)     Status: None (Preliminary result)   Collection Time: 12/10/24 10:15 AM   Specimen: BLOOD LEFT HAND  Result Value Ref Range Status   Specimen Description   Final    BLOOD LEFT HAND Performed at [facility], [address], [place], [place] [address]    Special Requests   Final    BOTTLES DRAWN AEROBIC AND ANAEROBIC Blood Culture results may not be optimal due to an inadequate volume of blood received in culture bottles Performed at [facility], [address], [place], [place] [address]    Culture   Final    NO GROWTH 2 DAYS Performed at [facility], [address] [place], [place] [address]    Report Status PENDING  Incomplete  MRSA Next Gen by PCR, Nasal     Status: None   Collection Time: 12/10/24  3:58 PM   Specimen: Nasal Mucosa; Nasal Swab  Result Value Ref Range Status   MRSA by PCR Next Gen NOT DETECTED NOT DETECTED Final    Comment: (NOTE) The GeneXpert MRSA Assay (FDA approved for NASAL  specimens only), is one component of a comprehensive MRSA colonization surveillance program. It is not intended to diagnose MRSA infection nor to guide or monitor treatment for MRSA infections. Test performance is not FDA approved in patients less than 32 years old. Performed  at [facility], [address] [place], [place] [address]     Labs: CBC: Recent Labs  Lab 12/09/24 1725 12/10/24 1052 12/11/24 0530 12/12/24 0524  WBC 11.1* 10.3 13.9* 12.4*  NEUTROABS  --   --  11.2* 10.0*  HGB 14.6 13.1 12.5* 12.8*  HCT 44.3 40.7 39.2 39.1  MCV 90.6 92.9 92.7 90.9  PLT 186 171 171 171   Basic Metabolic Panel: Recent Labs  Lab 12/09/24 1725 12/10/24 1052 12/11/24 0530 12/12/24 0524  NA 141 137 139 139  K 4.1 4.0 4.0 4.2  CL 102 101 102 101  CO2 23 20* 24 22  GLUCOSE 169* 194* 134* 124*  BUN 21 19 27* 28*  CREATININE 0.87 0.73 0.75 0.80  CALCIUM  10.1 9.5 9.4 9.7   Liver Function Tests: Recent Labs  Lab 12/09/24 1725 12/10/24 1052  AST 57* 62*  ALT 11 10  ALKPHOS 113 96  BILITOT 0.6 0.7  PROT 7.6 6.7  ALBUMIN  4.2 3.4*   CBG: No results for input(s): GLUCAP in the last 168 hours.  Discharge time spent: greater than 30 minutes.  Signed: Lonni KANDICE Moose, MD [facility] 12/12/2024 "

## 2024-12-13 LAB — CULTURE, BLOOD (ROUTINE X 2)
Culture: NO GROWTH
Culture: NO GROWTH
Special Requests: ADEQUATE

## 2024-12-27 ENCOUNTER — Ambulatory Visit: Admitting: Cardiovascular Disease

## 2024-12-29 ENCOUNTER — Ambulatory Visit

## 2025-01-27 ENCOUNTER — Ambulatory Visit

## 2025-04-28 ENCOUNTER — Ambulatory Visit
